# Patient Record
Sex: Female | Born: 1935 | Race: Black or African American | Hispanic: No | Smoking: Never smoker
Health system: Southern US, Community
[De-identification: ages and names within clinical notes are randomized; demographics above are authoritative.]

## PROBLEM LIST (undated history)

## (undated) DIAGNOSIS — I10 Essential (primary) hypertension: Secondary | ICD-10-CM

## (undated) DIAGNOSIS — Z973 Presence of spectacles and contact lenses: Secondary | ICD-10-CM

## (undated) DIAGNOSIS — K08109 Complete loss of teeth, unspecified cause, unspecified class: Secondary | ICD-10-CM

## (undated) DIAGNOSIS — Z9889 Other specified postprocedural states: Secondary | ICD-10-CM

## (undated) DIAGNOSIS — E039 Hypothyroidism, unspecified: Secondary | ICD-10-CM

## (undated) DIAGNOSIS — Z8719 Personal history of other diseases of the digestive system: Secondary | ICD-10-CM

## (undated) DIAGNOSIS — M1990 Unspecified osteoarthritis, unspecified site: Secondary | ICD-10-CM

## (undated) DIAGNOSIS — C801 Malignant (primary) neoplasm, unspecified: Secondary | ICD-10-CM

## (undated) DIAGNOSIS — C50919 Malignant neoplasm of unspecified site of unspecified female breast: Secondary | ICD-10-CM

## (undated) DIAGNOSIS — K449 Diaphragmatic hernia without obstruction or gangrene: Secondary | ICD-10-CM

## (undated) DIAGNOSIS — Z972 Presence of dental prosthetic device (complete) (partial): Secondary | ICD-10-CM

## (undated) DIAGNOSIS — T7840XA Allergy, unspecified, initial encounter: Secondary | ICD-10-CM

## (undated) DIAGNOSIS — M1A00X0 Idiopathic chronic gout, unspecified site, without tophus (tophi): Secondary | ICD-10-CM

## (undated) DIAGNOSIS — J189 Pneumonia, unspecified organism: Secondary | ICD-10-CM

## (undated) DIAGNOSIS — K219 Gastro-esophageal reflux disease without esophagitis: Secondary | ICD-10-CM

## (undated) DIAGNOSIS — N950 Postmenopausal bleeding: Secondary | ICD-10-CM

## (undated) DIAGNOSIS — H9190 Unspecified hearing loss, unspecified ear: Secondary | ICD-10-CM

## (undated) DIAGNOSIS — G250 Essential tremor: Secondary | ICD-10-CM

## (undated) DIAGNOSIS — Z974 Presence of external hearing-aid: Secondary | ICD-10-CM

## (undated) DIAGNOSIS — E079 Disorder of thyroid, unspecified: Secondary | ICD-10-CM

## (undated) DIAGNOSIS — E119 Type 2 diabetes mellitus without complications: Secondary | ICD-10-CM

## (undated) DIAGNOSIS — N3281 Overactive bladder: Secondary | ICD-10-CM

## (undated) DIAGNOSIS — M109 Gout, unspecified: Secondary | ICD-10-CM

## (undated) DIAGNOSIS — N182 Chronic kidney disease, stage 2 (mild): Secondary | ICD-10-CM

## (undated) DIAGNOSIS — M8580 Other specified disorders of bone density and structure, unspecified site: Secondary | ICD-10-CM

## (undated) DIAGNOSIS — Z8616 Personal history of COVID-19: Secondary | ICD-10-CM

## (undated) DIAGNOSIS — D649 Anemia, unspecified: Secondary | ICD-10-CM

## (undated) DIAGNOSIS — N840 Polyp of corpus uteri: Secondary | ICD-10-CM

## (undated) HISTORY — DX: Other specified postprocedural states: Z98.890

## (undated) HISTORY — DX: Disorder of thyroid, unspecified: E07.9

## (undated) HISTORY — PX: TUBAL LIGATION: SHX77

## (undated) HISTORY — PX: FINGER SURGERY: SHX640

## (undated) HISTORY — PX: OTHER SURGICAL HISTORY: SHX169

## (undated) HISTORY — DX: Allergy, unspecified, initial encounter: T78.40XA

## (undated) HISTORY — DX: Malignant (primary) neoplasm, unspecified: C80.1

## (undated) HISTORY — DX: Type 2 diabetes mellitus without complications: E11.9

## (undated) HISTORY — DX: Essential (primary) hypertension: I10

## (undated) HISTORY — PX: COLONOSCOPY: SHX174

---

## 1968-11-10 DIAGNOSIS — E890 Postprocedural hypothyroidism: Secondary | ICD-10-CM

## 1968-11-10 HISTORY — DX: Postprocedural hypothyroidism: E89.0

## 1968-11-10 HISTORY — PX: CHOLECYSTECTOMY: SHX55

## 1968-11-10 HISTORY — PX: CHOLECYSTECTOMY OPEN: SUR202

## 1968-11-10 HISTORY — PX: THYROID LOBECTOMY: SHX420

## 1968-11-10 HISTORY — PX: THYROID SURGERY: SHX805

## 1999-04-22 ENCOUNTER — Encounter: Payer: Self-pay | Admitting: Family Medicine

## 1999-04-22 ENCOUNTER — Ambulatory Visit (HOSPITAL_COMMUNITY): Admission: RE | Admit: 1999-04-22 | Discharge: 1999-04-22 | Payer: Self-pay | Admitting: Family Medicine

## 1999-07-30 ENCOUNTER — Encounter: Payer: Self-pay | Admitting: Family Medicine

## 1999-07-30 ENCOUNTER — Ambulatory Visit (HOSPITAL_COMMUNITY): Admission: RE | Admit: 1999-07-30 | Discharge: 1999-07-30 | Payer: Self-pay | Admitting: Family Medicine

## 1999-08-08 ENCOUNTER — Other Ambulatory Visit: Admission: RE | Admit: 1999-08-08 | Discharge: 1999-08-08 | Payer: Self-pay | Admitting: Family Medicine

## 2000-07-31 ENCOUNTER — Encounter: Payer: Self-pay | Admitting: Family Medicine

## 2000-07-31 ENCOUNTER — Ambulatory Visit (HOSPITAL_COMMUNITY): Admission: RE | Admit: 2000-07-31 | Discharge: 2000-07-31 | Payer: Self-pay | Admitting: Family Medicine

## 2000-09-10 HISTORY — PX: DILATION AND CURETTAGE OF UTERUS: SHX78

## 2000-09-17 ENCOUNTER — Inpatient Hospital Stay (HOSPITAL_COMMUNITY): Admission: AD | Admit: 2000-09-17 | Discharge: 2000-09-17 | Payer: Self-pay | Admitting: Obstetrics

## 2000-09-17 ENCOUNTER — Encounter (INDEPENDENT_AMBULATORY_CARE_PROVIDER_SITE_OTHER): Payer: Self-pay

## 2000-09-17 ENCOUNTER — Encounter: Payer: Self-pay | Admitting: Obstetrics

## 2000-09-18 ENCOUNTER — Other Ambulatory Visit: Admission: RE | Admit: 2000-09-18 | Discharge: 2000-09-18 | Payer: Self-pay | Admitting: Obstetrics

## 2000-09-18 ENCOUNTER — Encounter (INDEPENDENT_AMBULATORY_CARE_PROVIDER_SITE_OTHER): Payer: Self-pay

## 2000-09-23 ENCOUNTER — Encounter (INDEPENDENT_AMBULATORY_CARE_PROVIDER_SITE_OTHER): Payer: Self-pay

## 2000-09-23 ENCOUNTER — Ambulatory Visit (HOSPITAL_COMMUNITY): Admission: RE | Admit: 2000-09-23 | Discharge: 2000-09-23 | Payer: Self-pay | Admitting: Obstetrics

## 2000-10-03 ENCOUNTER — Emergency Department (HOSPITAL_COMMUNITY): Admission: EM | Admit: 2000-10-03 | Discharge: 2000-10-03 | Payer: Self-pay | Admitting: Emergency Medicine

## 2001-04-08 ENCOUNTER — Encounter: Payer: Self-pay | Admitting: Emergency Medicine

## 2001-04-08 ENCOUNTER — Emergency Department (HOSPITAL_COMMUNITY): Admission: EM | Admit: 2001-04-08 | Discharge: 2001-04-08 | Payer: Self-pay | Admitting: Emergency Medicine

## 2001-09-10 ENCOUNTER — Encounter: Payer: Self-pay | Admitting: Family Medicine

## 2001-09-10 ENCOUNTER — Ambulatory Visit (HOSPITAL_COMMUNITY): Admission: RE | Admit: 2001-09-10 | Discharge: 2001-09-10 | Payer: Self-pay | Admitting: Family Medicine

## 2002-03-22 ENCOUNTER — Encounter: Admission: RE | Admit: 2002-03-22 | Discharge: 2002-03-22 | Payer: Self-pay | Admitting: Cardiology

## 2002-03-22 ENCOUNTER — Encounter: Payer: Self-pay | Admitting: Cardiology

## 2002-04-01 ENCOUNTER — Ambulatory Visit (HOSPITAL_COMMUNITY): Admission: RE | Admit: 2002-04-01 | Discharge: 2002-04-01 | Payer: Self-pay | Admitting: Cardiology

## 2002-04-01 ENCOUNTER — Encounter: Payer: Self-pay | Admitting: Cardiology

## 2002-11-15 ENCOUNTER — Ambulatory Visit (HOSPITAL_COMMUNITY): Admission: RE | Admit: 2002-11-15 | Discharge: 2002-11-15 | Payer: Self-pay | Admitting: Family Medicine

## 2002-11-15 ENCOUNTER — Encounter: Payer: Self-pay | Admitting: Family Medicine

## 2004-04-03 ENCOUNTER — Encounter: Admission: RE | Admit: 2004-04-03 | Discharge: 2004-04-03 | Payer: Self-pay | Admitting: Family Medicine

## 2004-04-10 ENCOUNTER — Ambulatory Visit (HOSPITAL_COMMUNITY): Admission: RE | Admit: 2004-04-10 | Discharge: 2004-04-10 | Payer: Self-pay | Admitting: Family Medicine

## 2004-06-10 ENCOUNTER — Ambulatory Visit (HOSPITAL_COMMUNITY): Admission: RE | Admit: 2004-06-10 | Discharge: 2004-06-10 | Payer: Self-pay | Admitting: Gastroenterology

## 2005-04-20 ENCOUNTER — Emergency Department (HOSPITAL_COMMUNITY): Admission: AD | Admit: 2005-04-20 | Discharge: 2005-04-20 | Payer: Self-pay | Admitting: Emergency Medicine

## 2006-07-16 ENCOUNTER — Encounter: Admission: RE | Admit: 2006-07-16 | Discharge: 2006-07-16 | Payer: Self-pay | Admitting: Family Medicine

## 2007-02-17 ENCOUNTER — Ambulatory Visit (HOSPITAL_COMMUNITY): Admission: RE | Admit: 2007-02-17 | Discharge: 2007-02-17 | Payer: Self-pay | Admitting: Family Medicine

## 2007-04-21 ENCOUNTER — Ambulatory Visit (HOSPITAL_COMMUNITY): Admission: RE | Admit: 2007-04-21 | Discharge: 2007-04-21 | Payer: Self-pay | Admitting: Family Medicine

## 2007-09-29 ENCOUNTER — Emergency Department (HOSPITAL_COMMUNITY): Admission: EM | Admit: 2007-09-29 | Discharge: 2007-09-29 | Payer: Self-pay | Admitting: Family Medicine

## 2008-03-22 ENCOUNTER — Encounter: Admission: RE | Admit: 2008-03-22 | Discharge: 2008-03-22 | Payer: Self-pay | Admitting: Family Medicine

## 2008-06-07 ENCOUNTER — Ambulatory Visit (HOSPITAL_COMMUNITY): Admission: RE | Admit: 2008-06-07 | Discharge: 2008-06-07 | Payer: Self-pay | Admitting: Family Medicine

## 2009-08-05 ENCOUNTER — Emergency Department (HOSPITAL_COMMUNITY): Admission: EM | Admit: 2009-08-05 | Discharge: 2009-08-05 | Payer: Self-pay | Admitting: Emergency Medicine

## 2009-08-21 ENCOUNTER — Other Ambulatory Visit: Admission: RE | Admit: 2009-08-21 | Discharge: 2009-08-21 | Payer: Self-pay | Admitting: Family Medicine

## 2009-11-10 HISTORY — PX: SHOULDER ARTHROSCOPY: SHX128

## 2010-10-23 ENCOUNTER — Other Ambulatory Visit
Admission: RE | Admit: 2010-10-23 | Discharge: 2010-10-23 | Payer: Self-pay | Source: Home / Self Care | Admitting: Family Medicine

## 2010-12-01 ENCOUNTER — Encounter: Payer: Self-pay | Admitting: Family Medicine

## 2011-01-20 ENCOUNTER — Ambulatory Visit
Admission: RE | Admit: 2011-01-20 | Discharge: 2011-01-20 | Disposition: A | Discharge status: Home / Self Care | Payer: Medicare Other | Source: Ambulatory Visit | Attending: Orthopedic Surgery | Admitting: Orthopedic Surgery

## 2011-01-20 ENCOUNTER — Other Ambulatory Visit: Payer: Self-pay | Admitting: Orthopedic Surgery

## 2011-01-20 DIAGNOSIS — M25512 Pain in left shoulder: Secondary | ICD-10-CM

## 2011-02-12 ENCOUNTER — Encounter (HOSPITAL_COMMUNITY)
Admission: RE | Admit: 2011-02-12 | Discharge: 2011-02-12 | Disposition: A | Discharge status: Home / Self Care | Payer: Medicare Other | Source: Ambulatory Visit | Attending: Orthopedic Surgery | Admitting: Orthopedic Surgery

## 2011-03-03 ENCOUNTER — Encounter (HOSPITAL_COMMUNITY)
Admission: RE | Admit: 2011-03-03 | Discharge: 2011-03-03 | Disposition: A | Discharge status: Home / Self Care | Payer: Medicare Other | Source: Ambulatory Visit | Attending: Orthopedic Surgery | Admitting: Orthopedic Surgery

## 2011-03-03 ENCOUNTER — Other Ambulatory Visit (HOSPITAL_COMMUNITY): Payer: Self-pay | Admitting: Orthopedic Surgery

## 2011-03-03 DIAGNOSIS — Z01811 Encounter for preprocedural respiratory examination: Secondary | ICD-10-CM

## 2011-03-03 LAB — DIFFERENTIAL
Basophils Absolute: 0 10*3/uL (ref 0.0–0.1)
Basophils Relative: 0 % (ref 0–1)
Eosinophils Absolute: 0.2 10*3/uL (ref 0.0–0.7)
Eosinophils Relative: 4 % (ref 0–5)
Lymphocytes Relative: 34 % (ref 12–46)
Lymphs Abs: 2 10*3/uL (ref 0.7–4.0)
Monocytes Absolute: 0.6 10*3/uL (ref 0.1–1.0)
Monocytes Relative: 10 % (ref 3–12)
Neutro Abs: 3.1 10*3/uL (ref 1.7–7.7)
Neutrophils Relative %: 53 % (ref 43–77)

## 2011-03-03 LAB — TYPE AND SCREEN
ABO/RH(D): B POS
Antibody Screen: NEGATIVE
Unit division: 0

## 2011-03-03 LAB — URINALYSIS, ROUTINE W REFLEX MICROSCOPIC
Bilirubin Urine: NEGATIVE
Glucose, UA: NEGATIVE mg/dL
Ketones, ur: NEGATIVE mg/dL
Nitrite: NEGATIVE
Protein, ur: NEGATIVE mg/dL
Specific Gravity, Urine: 1.01 (ref 1.005–1.030)
Urobilinogen, UA: 1 mg/dL (ref 0.0–1.0)
pH: 6 (ref 5.0–8.0)

## 2011-03-03 LAB — BASIC METABOLIC PANEL
BUN: 15 mg/dL (ref 6–23)
CO2: 30 mEq/L (ref 19–32)
Calcium: 9.8 mg/dL (ref 8.4–10.5)
Chloride: 105 mEq/L (ref 96–112)
Creatinine, Ser: 1.24 mg/dL — ABNORMAL HIGH (ref 0.4–1.2)
GFR calc Af Amer: 51 mL/min — ABNORMAL LOW (ref >60)
GFR calc non Af Amer: 42 mL/min — ABNORMAL LOW (ref >60)
Glucose, Bld: 117 mg/dL — ABNORMAL HIGH (ref 70–99)
Potassium: 4 mEq/L (ref 3.5–5.1)
Sodium: 141 mEq/L (ref 135–145)

## 2011-03-03 LAB — CBC
HCT: 34.7 % — ABNORMAL LOW (ref 36.0–46.0)
Hemoglobin: 12.4 g/dL (ref 12.0–15.0)
MCH: 29.9 pg (ref 26.0–34.0)
MCHC: 35.7 g/dL (ref 30.0–36.0)
MCV: 83.6 fL (ref 78.0–100.0)
Platelets: 228 10*3/uL (ref 150–400)
RBC: 4.15 MIL/uL (ref 3.87–5.11)
RDW: 14.4 % (ref 11.5–15.5)
WBC: 6 10*3/uL (ref 4.0–10.5)

## 2011-03-03 LAB — PROTIME-INR
INR: 1.06 (ref 0.00–1.49)
Prothrombin Time: 14 seconds (ref 11.6–15.2)

## 2011-03-03 LAB — APTT: aPTT: 27 seconds (ref 24–37)

## 2011-03-03 LAB — SURGICAL PCR SCREEN
MRSA, PCR: NEGATIVE
Staphylococcus aureus: POSITIVE — AB

## 2011-03-03 LAB — URINE MICROSCOPIC-ADD ON

## 2011-03-03 LAB — ABO/RH: ABO/RH(D): B POS

## 2011-03-04 ENCOUNTER — Inpatient Hospital Stay (HOSPITAL_COMMUNITY): Payer: Medicare Other

## 2011-03-04 ENCOUNTER — Inpatient Hospital Stay (HOSPITAL_COMMUNITY)
Admission: RE | Admit: 2011-03-04 | Discharge: 2011-03-06 | DRG: 484 | Disposition: A | Discharge status: Home / Self Care | Payer: Medicare Other | Source: Ambulatory Visit | Attending: Orthopedic Surgery | Admitting: Orthopedic Surgery

## 2011-03-04 DIAGNOSIS — E039 Hypothyroidism, unspecified: Secondary | ICD-10-CM | POA: Diagnosis present

## 2011-03-04 DIAGNOSIS — E119 Type 2 diabetes mellitus without complications: Secondary | ICD-10-CM | POA: Diagnosis present

## 2011-03-04 DIAGNOSIS — Z01818 Encounter for other preprocedural examination: Secondary | ICD-10-CM

## 2011-03-04 DIAGNOSIS — M19019 Primary osteoarthritis, unspecified shoulder: Principal | ICD-10-CM | POA: Diagnosis present

## 2011-03-04 DIAGNOSIS — I10 Essential (primary) hypertension: Secondary | ICD-10-CM | POA: Diagnosis present

## 2011-03-04 DIAGNOSIS — E669 Obesity, unspecified: Secondary | ICD-10-CM | POA: Diagnosis present

## 2011-03-04 DIAGNOSIS — Z01812 Encounter for preprocedural laboratory examination: Secondary | ICD-10-CM

## 2011-03-04 LAB — GLUCOSE, CAPILLARY
Glucose-Capillary: 99 mg/dL (ref 70–99)
Glucose-Capillary: 105 mg/dL — ABNORMAL HIGH (ref 70–99)
Glucose-Capillary: 92 mg/dL (ref 70–99)
Glucose-Capillary: 112 mg/dL — ABNORMAL HIGH (ref 70–99)

## 2011-03-05 LAB — GLUCOSE, CAPILLARY
Glucose-Capillary: 125 mg/dL — ABNORMAL HIGH (ref 70–99)
Glucose-Capillary: 107 mg/dL — ABNORMAL HIGH (ref 70–99)
Glucose-Capillary: 118 mg/dL — ABNORMAL HIGH (ref 70–99)
Glucose-Capillary: 125 mg/dL — ABNORMAL HIGH (ref 70–99)

## 2011-03-05 LAB — CBC
HCT: 25.3 % — ABNORMAL LOW (ref 36.0–46.0)
Hemoglobin: 9 g/dL — ABNORMAL LOW (ref 12.0–15.0)
MCH: 29.8 pg (ref 26.0–34.0)
MCHC: 35.6 g/dL (ref 30.0–36.0)
MCV: 83.8 fL (ref 78.0–100.0)
Platelets: 170 10*3/uL (ref 150–400)
RBC: 3.02 MIL/uL — ABNORMAL LOW (ref 3.87–5.11)
RDW: 14.5 % (ref 11.5–15.5)
WBC: 5.3 10*3/uL (ref 4.0–10.5)

## 2011-03-05 LAB — BASIC METABOLIC PANEL
BUN: 17 mg/dL (ref 6–23)
CO2: 26 mEq/L (ref 19–32)
Calcium: 8.4 mg/dL (ref 8.4–10.5)
Chloride: 108 mEq/L (ref 96–112)
Creatinine, Ser: 1.32 mg/dL — ABNORMAL HIGH (ref 0.4–1.2)
GFR calc Af Amer: 48 mL/min — ABNORMAL LOW (ref >60)
GFR calc non Af Amer: 39 mL/min — ABNORMAL LOW (ref >60)
Glucose, Bld: 122 mg/dL — ABNORMAL HIGH (ref 70–99)
Potassium: 3.8 mEq/L (ref 3.5–5.1)
Sodium: 141 mEq/L (ref 135–145)

## 2011-03-06 LAB — CBC
HCT: 25 % — ABNORMAL LOW (ref 36.0–46.0)
Hemoglobin: 8.8 g/dL — ABNORMAL LOW (ref 12.0–15.0)
MCH: 29.4 pg (ref 26.0–34.0)
MCHC: 35.2 g/dL (ref 30.0–36.0)
MCV: 83.6 fL (ref 78.0–100.0)
Platelets: 175 10*3/uL (ref 150–400)
RBC: 2.99 MIL/uL — ABNORMAL LOW (ref 3.87–5.11)
RDW: 14.5 % (ref 11.5–15.5)
WBC: 6.3 10*3/uL (ref 4.0–10.5)

## 2011-03-06 LAB — BASIC METABOLIC PANEL
BUN: 21 mg/dL (ref 6–23)
CO2: 26 mEq/L (ref 19–32)
Calcium: 8.8 mg/dL (ref 8.4–10.5)
Chloride: 103 mEq/L (ref 96–112)
Creatinine, Ser: 1.53 mg/dL — ABNORMAL HIGH (ref 0.4–1.2)
GFR calc Af Amer: 40 mL/min — ABNORMAL LOW (ref >60)
GFR calc non Af Amer: 33 mL/min — ABNORMAL LOW (ref >60)
Glucose, Bld: 112 mg/dL — ABNORMAL HIGH (ref 70–99)
Potassium: 3.9 mEq/L (ref 3.5–5.1)
Sodium: 138 mEq/L (ref 135–145)

## 2011-03-06 LAB — GLUCOSE, CAPILLARY
Glucose-Capillary: 117 mg/dL — ABNORMAL HIGH (ref 70–99)
Glucose-Capillary: 100 mg/dL — ABNORMAL HIGH (ref 70–99)

## 2011-03-14 NOTE — Op Note (Signed)
NAMEEGAN, SAHLIN               ACCOUNT NO.:  [id number]  MEDICAL RECORD NO.:  [id number]           PATIENT TYPE:  I  LOCATION:  5019                         FACILITY:  [facility]  PHYSICIAN:  Jones Broom, MD    DATE OF BIRTH:  1935-10-18  DATE OF PROCEDURE:  03/04/2011 DATE OF DISCHARGE:                              OPERATIVE REPORT   PREOPERATIVE DIAGNOSIS:  Left shoulder glenohumeral arthritis.  POSTOPERATIVE DIAGNOSIS:  Left shoulder glenohumeral arthritis.  PROCEDURE PERFORMED:  Left total shoulder arthroplasty.  ATTENDING SURGEON:  Jones Broom, MD  ASSISTANT:  Skip Mayer, PA-C  COMPLICATIONS:  None.  DRAINS:  One medium Hemovac.  SPECIMENS:  The humeral head was discarded.  ESTIMATED BLOOD LOSS:  200 mL.  ANESTHESIA:  General with preoperative interscalene block.  INDICATIONS FOR SURGERY:  The patient is a 76 year old female with long history of left shoulder pain with a known left glenohumeral osteoarthritis.  She failed conservative management and wished to go forward with total shoulder arthroplasty for pain relief and increased function.  She understood risks, benefits, and alternatives to the procedure including but not limited to risk of bleeding, infection, damage to neurovascular structures, risk of stiffness, and potential need for future revision surgery.  She understood all that and elected to go forward with surgery.  FINDINGS:  Preoperative range of motion was about 35 degrees external rotation.  No instability.  The shoulder was replaced with DePuy AP total shoulder with a 12 stem, 44 x 15 eccentric head, 44 anchor peg glenoid with pressure cementation of the glenoid and Press-Fit stem.  PROCEDURE IN DETAIL:  The patient was identified in the preoperative holding area where I personally marked the operative site after verifying site side and procedure with the patient.  She had an interscalene block given by the attending  anesthesiologist.  She was taken back to the operating room where general anesthesia was induced without complication.  She was placed in a beach-chair position with the back raised about 35 degrees.  The left upper extremity was prepped and draped in a standard sterile fashion.  The patient did receive 2 g of IV Ancef within 30 minutes of the incision.  The appropriate time-out procedure was carried out.  Left upper extremity was prepped and draped, and approximately 10-cm incision was made from the palpable coracoid tip to the mid humeral level at the axilla.  Dissection was carried down through subcutaneous tissues and the cephalic vein was identified and taken laterally with the deltoid.  The pectoralis major was taken medially.  The upper 1 cm of the pectoralis major was released from its attachment on the humerus.  Clavipectoral fascia was incised just lateral to conjoined tendon.  This incision was carried up to but not into the coracoacromial ligament.  Digital palpation was used to prove the integrity of the axillary nerve, which was protected throughout the procedure.  Musculocutaneous nerve was not palpated in the operative field.  The conjoined tendon was then retracted medially and the deltoid laterally.  Anterior circumflex humeral vessels were clamped and coagulated.  The soft tissues overlying the biceps was incised and  this incision was carried across the transverse humeral ligament to the base of the coracoid.  The biceps was then tenodesed to the soft tissue just above the pectoralis major and the remaining portion of the biceps superiorly was excised.  An osteotomy was then performed to the lesser tuberosity and the subscapularis was freed from the underlying capsule. Capsule was then released all the way down to the 6 o'clock position of the humeral head.  The humeral head was then delivered with simultaneous abduction, extension, and external rotation.  All humeral  osteophytes were removed and the anatomic neck of the humerus was marked and cut freehand at approximately 25-30 degrees retroversion with about 3 mm from the posterior cuff reflection.  The head size was estimated be a 44 medium offset.  At that point, the humeral head was retracted posteriorly with a Coude retractor.  Given the small sizer of her head, Coude retractor did not retract the head well and was causing some compression of the anterior cortex.  Therefore, a large Derra retractor was used.  The anterior-inferior capsule was excised taking great care to protect the axillary nerve.  Digital palpation demonstrated integrity of the axillary nerve after the capsular excision.  The remaining biceps anchor and the entire anterior-inferior labrum was excised.  The posterior labrum was excised.  The posterior capsule was not released. The guide pin was then placed using the 44 template bicortically.  The reamer was then used to ream the concentric bone with punctate bleeding. This gave an excellent concentric surface.  The center hole was then drilled for anchor peg glenoid followed by the three peripheral holes and none of the holes exited the glenoid wall.  I then pulse irrigated these holes and dried them with Surgicel and thrombin.  The three peripheral holes were then pressure cemented and the anchor peg glenoid was placed and impacted with an excellent fit.  A 44 glenoid was used. This was held manually in place until the cement hardened.  Proximal humerus was then again exposed taking care not to displace the glenoid. There was some compression of the anterior cortex from exposure of the glenoid.  The humerus was then sequentially reamed going from 6-12 mm reamer by 2 mm increments.  The 12-mm reamer was found to have appropriate cortical contact.  A 12 box osteotome was then used with a 12 broach.  The broach handle was removed and a trial head was placed. The 44 x 15 head fit  best.  With the trial implant, there was approximately 50% posterior translation with immediate snap back to the anatomic position.  No tendency towards posterior subluxation with forward elevation.  The trial was removed and the final implant was prepared on the back table with a 12 stem and a 44 x 15 eccentric head. The implant was then impacted and achieved excellent anatomic reconstruction of the proximal humerus.  Bone graft was placed anteriorly where the cortex had been pushed down slightly.  A #2 fiber wire was placed around the neck prior to impaction for double row repair of the subscapularis.  The joint was then copiously irrigated with pulse lavage.  Subscapularis and lesser tuberosity were then repaired using three #2 fiber wires and the #2 FiberWire around the neck of the implant in a double row type repair.  One #1 Ethibond was then placed at the rotator interval just above the lesser tuberosity.  After repair of the lesser tuberosity, a medium Hemovac was then placed out anterolaterally  and again copious irrigation was used.  Skin was then closed in layers with 2-0 Vicryl in deep dermal layer, 4-0 Monocryl for skin closure. Sterile dressing was then applied including Steri-Strips, 4x4s, ABDs, and tape.  The patient was placed in a regular sling and allowed to awaken from general anesthesia.  She was transferred to the stretcher and taken to the recovery room in stable condition.  POSTOPERATIVE PLAN:  She will be kept in the hospital for pain control and therapy.  She will have a goal of 40 degrees external rotation with 140 degrees forward flexion passively using the other arm.  No active motion at this time.  She will likely be kept in the hospital for 2 days and then discharged to home with her family.     Jones Broom, MD     JC/MEDQ  D:  03/04/2011  T:  03/05/2011  Job:  [id number]  Electronically Signed by Jones Broom  on 03/14/2011 08:21:54 AM

## 2011-03-28 NOTE — Op Note (Signed)
[facility]  Patient:    Karla Willis, Karla Willis                      MRN: [id number] Proc. Date: 09/23/00 Adm. Date:  [id number] Attending:  Venita Sheffield                           Operative Report  PREOPERATIVE DIAGNOSIS:       Postmenopausal bleeding.  SURGEON:                      Kathreen Cosier, M.D.  ANESTHESIA:                   MAC.  DESCRIPTION OF PROCEDURE:     With the patient in the lithotomy position, after the MAC had been administered, the perineum and vagina were prepped and draped.  The bladder was entered with a straight catheter.  A bimanual examination revealed the uterus to be top normal size.  Negative adnexa.  A weighted speculum was placed in the vagina.  The cervix was injected at 3, 6, and 9 oclock with 1% Xylocaine, for a total of 6 cc.  The cervix was curetted.  A small amount of tissue was obtained.  The endometrial cavity was sounded to 10.0 cm.  The cervix was dilated with a 27-French.  Then the cavity was curetted until clean.  A small amount of tissue was obtained.  The patient tolerated the procedure well, and was taken to the recovery room in good condition. DD:  09/23/00 TD:  09/23/00 Job: [id number]

## 2011-03-28 NOTE — Op Note (Signed)
NAME:  Karla Willis, Karla Willis                         ACCOUNT NO.:  [id number]   MEDICAL RECORD NO.:  [id number]                   PATIENT TYPE:  AMB   LOCATION:  ENDO                                 FACILITY:  [facility]   PHYSICIAN:  Anselmo Rod, M.D.               DATE OF BIRTH:  05-Jul-1935   DATE OF PROCEDURE:  06/10/2004  DATE OF DISCHARGE:                                 OPERATIVE REPORT   PROCEDURE PERFORMED:  Screening colonoscopy.   ENDOSCOPIST:  Anselmo Rod, M.D.   INSTRUMENT USED:  Olympus video colonoscope.   INDICATION FOR PROCEDURE:  A 76 year old African-American female undergoing  screening colonoscopy.  Rule out colonic polyps, masses, etc.  The patient  had guaiac-positive stool on a recent physical.   PREPROCEDURE PREPARATION:  Informed consent was procured from the patient.  The patient was fasted for eight hours prior to the procedure and prepped  with a bottle of magnesium citrate and a gallon of GoLYTELY the night prior  to the procedure.   PREPROCEDURE PHYSICAL:  VITAL SIGNS:  The patient had stable vital signs.  NECK:  Supple.  CHEST:  Clear to auscultation.  S1, S2 regular.  ABDOMEN:  Soft with normal bowel sounds.   DESCRIPTION OF PROCEDURE:  The patient was placed in the left lateral  decubitus position and sedated with 70 mg of Demerol and 7 mg of Versed in  slow incremental doses.  Once the patient was adequately sedate and  maintained on low-flow oxygen and continuous cardiac monitoring, the Olympus  video colonoscope was advanced from the rectum to the cecum.  The  appendiceal orifice and the ileocecal valve were clearly visualized and  photographed.  No masses, polyps, erosions, ulcerations, or diverticula were  seen.   IMPRESSION:  Normal colonoscopy.  Proceed with an EGD at this time.  Further  recommendations made thereafter.                                               Anselmo Rod, M.D.    JNM/MEDQ  D:  06/10/2004  T:  06/11/2004   Job:  [id number]   cc:   Renaye Rakers, M.D.  [phone] [address], [address]  [place]  [place] [address]  Fax: [phone]

## 2011-03-28 NOTE — Op Note (Signed)
NAME:  Karla Willis, Karla Willis                         ACCOUNT NO.:  [id number]   MEDICAL RECORD NO.:  [id number]                   PATIENT TYPE:  AMB   LOCATION:  ENDO                                 FACILITY:  [facility]   PHYSICIAN:  Anselmo Rod, M.D.               DATE OF BIRTH:  02-20-1935   DATE OF PROCEDURE:  06/10/2004  DATE OF DISCHARGE:                                 OPERATIVE REPORT   PROCEDURE PERFORMED:  Esophagogastroduodenoscopy.   ENDOSCOPIST:  Charna Elizabeth, M.D.   INSTRUMENT USED:  Olympus video panendoscope.   INDICATIONS FOR PROCEDURE:  Guaiac positive stools in a 76 year old African-  American female with a normal colonoscopy.  Rule out peptic ulcer disease,  esophagitis, gastritis, etc.   PREPROCEDURE PREPARATION:  Informed consent was procured from the patient.  The patient was fasted for eight hours prior to the procedure.   PREPROCEDURE PHYSICAL:  The patient had stable vital signs.  Neck supple,  chest clear to auscultation.  S1, S2 regular.  Abdomen soft with normal  bowel sounds.   DESCRIPTION OF PROCEDURE:  The patient was placed in the left lateral  decubitus position and sedated with Demerol and Versed.  Once the patient  was adequately sedated and maintained on low-flow oxygen and continuous  cardiac monitoring, the Olympus video panendoscope was advanced through the  mouth piece over the tongue into the esophagus under direct vision.  The  entire esophagus appeared normal with no evidence of ring, stricture,  masses, esophagitis or Barrett's mucosa.  The scope was then advanced to the  stomach.  The entire gastric mucosa and proximal small bowel appeared  normal.   IMPRESSION:  Normal esophagogastroduodenoscopy.   RECOMMENDATIONS:  Outpatient follow-up for repeat guaiac testing and CBC  check.  Further recommendations made in follow-up.                                               Anselmo Rod, M.D.    JNM/MEDQ  D:  06/10/2004  T:  06/11/2004   Job:  [id number]   cc:   Renaye Rakers, M.D.  [phone] [address], [address]  [place]  [place] [address]  Fax: [phone]

## 2011-06-11 ENCOUNTER — Inpatient Hospital Stay (INDEPENDENT_AMBULATORY_CARE_PROVIDER_SITE_OTHER)
Admission: RE | Admit: 2011-06-11 | Discharge: 2011-06-11 | Disposition: A | Discharge status: Home / Self Care | Payer: Medicare Other | Source: Ambulatory Visit | Attending: Family Medicine | Admitting: Family Medicine

## 2011-06-11 DIAGNOSIS — M799 Soft tissue disorder, unspecified: Secondary | ICD-10-CM

## 2011-06-11 DIAGNOSIS — M542 Cervicalgia: Secondary | ICD-10-CM

## 2011-11-18 ENCOUNTER — Other Ambulatory Visit (HOSPITAL_COMMUNITY): Payer: Self-pay | Admitting: Family Medicine

## 2011-11-18 DIAGNOSIS — Z1231 Encounter for screening mammogram for malignant neoplasm of breast: Secondary | ICD-10-CM

## 2011-12-16 ENCOUNTER — Ambulatory Visit (HOSPITAL_COMMUNITY)
Admission: RE | Admit: 2011-12-16 | Discharge: 2011-12-16 | Disposition: A | Discharge status: Home / Self Care | Payer: Medicare Other | Source: Ambulatory Visit | Attending: Family Medicine | Admitting: Family Medicine

## 2011-12-16 DIAGNOSIS — Z1231 Encounter for screening mammogram for malignant neoplasm of breast: Secondary | ICD-10-CM | POA: Insufficient documentation

## 2011-12-23 ENCOUNTER — Other Ambulatory Visit: Payer: Self-pay | Admitting: Family Medicine

## 2011-12-23 DIAGNOSIS — R928 Other abnormal and inconclusive findings on diagnostic imaging of breast: Secondary | ICD-10-CM

## 2011-12-30 ENCOUNTER — Ambulatory Visit
Admission: RE | Admit: 2011-12-30 | Discharge: 2011-12-30 | Disposition: A | Discharge status: Home / Self Care | Payer: Medicare Other | Source: Ambulatory Visit | Attending: Family Medicine | Admitting: Family Medicine

## 2011-12-30 DIAGNOSIS — R928 Other abnormal and inconclusive findings on diagnostic imaging of breast: Secondary | ICD-10-CM

## 2012-05-28 ENCOUNTER — Other Ambulatory Visit: Payer: Self-pay | Admitting: Family Medicine

## 2012-05-28 DIAGNOSIS — N63 Unspecified lump in unspecified breast: Secondary | ICD-10-CM

## 2012-06-17 ENCOUNTER — Other Ambulatory Visit: Payer: Medicare Other

## 2012-06-22 ENCOUNTER — Ambulatory Visit
Admission: RE | Admit: 2012-06-22 | Discharge: 2012-06-22 | Disposition: A | Discharge status: Home / Self Care | Payer: Medicare Other | Source: Ambulatory Visit | Attending: Family Medicine | Admitting: Family Medicine

## 2012-06-22 ENCOUNTER — Other Ambulatory Visit: Payer: Self-pay | Admitting: Family Medicine

## 2012-06-22 DIAGNOSIS — N63 Unspecified lump in unspecified breast: Secondary | ICD-10-CM

## 2012-11-26 ENCOUNTER — Other Ambulatory Visit: Payer: Self-pay | Admitting: Family Medicine

## 2012-11-26 ENCOUNTER — Other Ambulatory Visit (HOSPITAL_COMMUNITY)
Admission: RE | Admit: 2012-11-26 | Discharge: 2012-11-26 | Disposition: A | Discharge status: Home / Self Care | Payer: Medicare Other | Source: Ambulatory Visit | Attending: Family Medicine | Admitting: Family Medicine

## 2012-11-26 DIAGNOSIS — Z124 Encounter for screening for malignant neoplasm of cervix: Secondary | ICD-10-CM | POA: Insufficient documentation

## 2013-08-31 ENCOUNTER — Other Ambulatory Visit: Payer: Self-pay

## 2013-08-31 DIAGNOSIS — Z1231 Encounter for screening mammogram for malignant neoplasm of breast: Secondary | ICD-10-CM

## 2013-09-28 ENCOUNTER — Ambulatory Visit
Admission: RE | Admit: 2013-09-28 | Discharge: 2013-09-28 | Disposition: A | Discharge status: Home / Self Care | Payer: Medicare Other | Source: Ambulatory Visit

## 2013-09-28 DIAGNOSIS — Z1231 Encounter for screening mammogram for malignant neoplasm of breast: Secondary | ICD-10-CM

## 2013-09-29 ENCOUNTER — Other Ambulatory Visit: Payer: Self-pay | Admitting: Family Medicine

## 2013-09-29 DIAGNOSIS — R928 Other abnormal and inconclusive findings on diagnostic imaging of breast: Secondary | ICD-10-CM

## 2013-10-19 ENCOUNTER — Other Ambulatory Visit: Payer: Self-pay | Admitting: Family Medicine

## 2013-10-19 ENCOUNTER — Ambulatory Visit
Admission: RE | Admit: 2013-10-19 | Discharge: 2013-10-19 | Disposition: A | Discharge status: Home / Self Care | Payer: Medicare Other | Source: Ambulatory Visit | Attending: Family Medicine | Admitting: Family Medicine

## 2013-10-19 DIAGNOSIS — R928 Other abnormal and inconclusive findings on diagnostic imaging of breast: Secondary | ICD-10-CM

## 2013-10-19 DIAGNOSIS — R921 Mammographic calcification found on diagnostic imaging of breast: Secondary | ICD-10-CM

## 2013-10-24 ENCOUNTER — Ambulatory Visit
Admission: RE | Admit: 2013-10-24 | Discharge: 2013-10-24 | Disposition: A | Discharge status: Home / Self Care | Payer: Medicare Other | Source: Ambulatory Visit | Attending: Family Medicine | Admitting: Family Medicine

## 2013-10-24 DIAGNOSIS — D0511 Intraductal carcinoma in situ of right breast: Secondary | ICD-10-CM

## 2013-10-24 DIAGNOSIS — C50919 Malignant neoplasm of unspecified site of unspecified female breast: Secondary | ICD-10-CM

## 2013-10-24 DIAGNOSIS — R921 Mammographic calcification found on diagnostic imaging of breast: Secondary | ICD-10-CM

## 2013-10-24 HISTORY — DX: Malignant neoplasm of unspecified site of unspecified female breast: C50.919

## 2013-10-24 HISTORY — DX: Intraductal carcinoma in situ of right breast: D05.11

## 2013-10-26 ENCOUNTER — Other Ambulatory Visit: Payer: Self-pay | Admitting: Family Medicine

## 2013-10-26 DIAGNOSIS — D0511 Intraductal carcinoma in situ of right breast: Secondary | ICD-10-CM

## 2013-10-26 HISTORY — PX: BREAST BIOPSY: SHX20

## 2013-10-28 ENCOUNTER — Telehealth: Payer: Self-pay | Admitting: *Deleted

## 2013-10-28 NOTE — Telephone Encounter (Signed)
Left vm for pt to call concerning appt.

## 2013-11-01 ENCOUNTER — Other Ambulatory Visit: Payer: Medicare Other

## 2013-11-02 ENCOUNTER — Telehealth: Payer: Self-pay | Admitting: *Deleted

## 2013-11-02 NOTE — Telephone Encounter (Signed)
Called and spoke with patient.  Contact information given.  Patient states she has some bleeding since her biopsy on 10/24/13.  Instructed [facility] to call [facility] to let them know and that they may want her to come in to have it assessed.  Patient verbalized understanding.  Encouraged patient to call with any questions or concerns.

## 2013-11-04 ENCOUNTER — Ambulatory Visit
Admission: RE | Admit: 2013-11-04 | Discharge: 2013-11-04 | Disposition: A | Discharge status: Home / Self Care | Payer: Medicare Other | Source: Ambulatory Visit | Attending: Family Medicine | Admitting: Family Medicine

## 2013-11-04 DIAGNOSIS — D0511 Intraductal carcinoma in situ of right breast: Secondary | ICD-10-CM

## 2013-11-04 MED ORDER — GADOBENATE DIMEGLUMINE 529 MG/ML IV SOLN
20.0000 mL | Freq: Once | INTRAVENOUS | Status: AC | PRN
  Administered 2013-11-04: 20 mL via INTRAVENOUS

## 2013-11-11 ENCOUNTER — Encounter (INDEPENDENT_AMBULATORY_CARE_PROVIDER_SITE_OTHER): Payer: Self-pay | Admitting: General Surgery

## 2013-11-11 ENCOUNTER — Encounter (INDEPENDENT_AMBULATORY_CARE_PROVIDER_SITE_OTHER): Payer: Self-pay

## 2013-11-11 ENCOUNTER — Ambulatory Visit (INDEPENDENT_AMBULATORY_CARE_PROVIDER_SITE_OTHER): Payer: Medicare Other | Admitting: General Surgery

## 2013-11-11 VITALS — BP 158/72 | HR 80 | Resp 18 | Ht 62.0 in | Wt 265.8 lb

## 2013-11-11 DIAGNOSIS — D0510 Intraductal carcinoma in situ of unspecified breast: Secondary | ICD-10-CM | POA: Insufficient documentation

## 2013-11-11 DIAGNOSIS — D0590 Unspecified type of carcinoma in situ of unspecified breast: Secondary | ICD-10-CM

## 2013-11-11 DIAGNOSIS — D0511 Intraductal carcinoma in situ of right breast: Secondary | ICD-10-CM

## 2013-11-11 HISTORY — DX: Intraductal carcinoma in situ of unspecified breast: D05.10

## 2013-11-11 MED ORDER — CEPHALEXIN 500 MG PO CAPS: 500.0000 mg | ORAL_CAPSULE | Freq: Four times a day (QID) | ORAL | Status: AC

## 2013-11-11 NOTE — Progress Notes (Signed)
Chief Complaint: New diagnosis of breast cancer  History:    Karla Willis is a 79 y.o. postmenopausal female referred by Dr. Almyra Free  for evaluation of recently diagnosed carcinoma of the right breast. She recently presented for a screening mamogram revealing approximately 1.5 cm cluster of suspicious calcifications in the medial right breast..  Subsequent imaging included diagnostic mamogram showing confirmation of these calcifications.    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 She is seen now in the office for initial treatment planning.  She has experienced no breast symptoms, specifically lobe or nipple discharge or skin changes. She does have a personal history of any previous breast problems with a remote previous benign right breast biopsy. The patient has noted some drainage persistently from the core biopsy site.  bilateral breast MRI has been performed showing no other abnormalities and confirming a 1.6 cm area of enhancement at the known site of malignancy..  Findings at that time were the following:  Tumor size: 1.6 cm  Tumor grade: nonspecified Estrogen Receptor: positive Progesterone Receptor: positive     Past Medical History  Diagnosis Date  . Allergy   . Cancer   . Diabetes mellitus without complication   . Thyroid disease   . Hypertension     Past Surgical History  Procedure Laterality Date  . Gallbladder sugery    . Thyroid surgery    . Cholecystectomy      Current Outpatient Prescriptions  Medication Sig Dispense Refill  . amLODipine (NORVASC) 5 MG tablet Take 5 mg by mouth daily.      . febuxostat (ULORIC) 40 MG tablet Take 40 mg by mouth daily. The pt is taking this as a trial medication so it could be a placebo for gout      . levothyroxine (SYNTHROID, LEVOTHROID) 100 MCG tablet Take 100 mcg by mouth daily before breakfast.      . lisinopril-hydrochlorothiazide (PRINZIDE,ZESTORETIC)  10-12.5 MG per tablet Take 1 tablet by mouth daily.      . pioglitazone (ACTOS) 30 MG tablet Take 30 mg by mouth daily.      . cephALEXin (KEFLEX) 500 MG capsule Take 1 capsule (500 mg total) by mouth 4 (four) times daily.  40 capsule  0   No current facility-administered medications for this visit.    History reviewed. No pertinent family history.  History   Social History  . Marital Status: Widowed    Spouse Name: N/A    Number of Children: N/A  . Years of Education: N/A   Social History Main Topics  . Smoking status: Never Smoker   . Smokeless tobacco: None  . Alcohol Use: No  . Drug Use: No  . Sexual Activity: None   Other Topics Concern  . None   Social History Narrative  . None     Review of Systems Constitutional: negative Respiratory: negative, mild SOB on exertion Cardiovascular: negative Musculoskeletal:positive for arthralgias     Objective:  BP 158/72  Pulse 80  Resp 18  Ht 5\' 2"  (1.575 m)  Wt 265 lb 12.8 oz (120.566 kg)  BMI 48.60 kg/m2  General: Alert, obese African American female, in no distress Skin: Warm and dry without rash or infection. HEENT: No palpable masses or thyromegaly. Sclera nonicteric. Pupils equal round and reactive. Oropharynx clear. Breasts: large breasts bilaterally. In the medial right breast there is an approximately 1 cm skin wound with overlying eschar and minimal surrounding erythema  and slight cloudy drainage. No other abnormalities in either breast. No palpable axillary adenopathy. Lymph nodes: No cervical, supraclavicular, or inguinal nodes palpable. Lungs: Breath sounds clear and equal without increased work of breathing Cardiovascular: Regular rate and rhythm without murmur. No JVD or edema. Peripheral pulses intact. Abdomen: Nondistended. Soft and nontender. No masses palpable. No organomegaly. No palpable hernias. Extremities: 1-2+ bilateral edema or joint swelling or deformity. No chronic venous stasis  changes. Neurologic: Alert and fully oriented. Gait normal.   Laboratory data:  CBC:  Lab Results  Component Value Date   WBC 6.3 03/06/2011   RBC 2.99* 03/06/2011   HGB 8.8* 03/06/2011   HCT 25.0* 03/06/2011   PLT 175 03/06/2011  ]  [facility] Labs:  Lab Results  Component Value Date   NA 138 03/06/2011   K 3.9 03/06/2011   CL 103 03/06/2011   CO2 26 03/06/2011   BUN 21 03/06/2011   CREATININE 1.53* 03/06/2011   CALCIUM 8.8 03/06/2011     Assessment  79 y.o. female with a new diagnosis of cancer of the the right breast upper inner quadrant.  Clinical 0, estrogen receptor positive. I discussed with the patient and family members present today initial surgical treatment options. We discussed options of breast conservation with lumpectomy or total mastectomy and sentinal lymph node biopsy/dissection. Options for reconstruction were discussed. After discussion they have elected to proceed with right partial mastectomy.  We discussed the indications and nature of the procedure, and expected recovery, in detail. Surgical risks including anesthetic complications, cardiorespiratory complications, bleeding, infection, wound healing complications, blood clots, lymphedema, local and distant recurrence and possible need for further surgery based on the final pathology was discussed and understood.  Chemotherapy, hormonal therapy and radiation therapy have been discussed. They have been provided with literature regarding the treatment of breast cancer.  All questions were answered. They understand and agree to proceed and we will go ahead with scheduling.  Plan Needle localized right partial mastectomy. Due to the open wound and mild infection on going to plan her surgery for about 6 weeks. She'll continue wound care with dressing changes and topical antibiotic over a 10 day course of Keflex. I will see her back in 4 weeks to make sure the wound is healed before proceeding with polypectomy.  Edward Jolly  MD, FACS  11/11/2013, 10:24 AM

## 2013-12-09 ENCOUNTER — Ambulatory Visit (INDEPENDENT_AMBULATORY_CARE_PROVIDER_SITE_OTHER): Payer: Medicare Other | Admitting: General Surgery

## 2013-12-09 ENCOUNTER — Encounter (HOSPITAL_BASED_OUTPATIENT_CLINIC_OR_DEPARTMENT_OTHER): Payer: Self-pay | Admitting: *Deleted

## 2013-12-09 ENCOUNTER — Encounter (INDEPENDENT_AMBULATORY_CARE_PROVIDER_SITE_OTHER): Payer: Self-pay | Admitting: General Surgery

## 2013-12-09 VITALS — BP 146/74 | HR 76 | Temp 98.1°F | Resp 14 | Ht 62.0 in | Wt 263.6 lb

## 2013-12-09 DIAGNOSIS — D0590 Unspecified type of carcinoma in situ of unspecified breast: Secondary | ICD-10-CM

## 2013-12-09 DIAGNOSIS — D0510 Intraductal carcinoma in situ of unspecified breast: Secondary | ICD-10-CM

## 2013-12-09 NOTE — Progress Notes (Signed)
Chief complaint: Preop visit prior to planned right breast lumpectomy  History: Patient returns to the office with a history of recent diagnosis of 1.6 cm area of ductal carcinoma in situ of the right breast. At her initial evaluation she had some evidence of cellulitis and skin breakdown around the core biopsy site and I asked her to return for a followup visit prior to her surgery which is scheduled for next week. She feels the area is doing much better.  Past Medical History  Diagnosis Date  . Allergy   . Cancer   . Diabetes mellitus without complication   . Thyroid disease   . Hypertension   . Wears glasses   . HOH (hard of hearing)   . Arthritis   . Gout    Past Surgical History  Procedure Laterality Date  . Gallbladder sugery    . Thyroid surgery    . Cholecystectomy    . Tubal ligation    . Finger surgery      right hand  . Shoulder arthroscopy  2011    left  . Colonoscopy     Current Outpatient Prescriptions  Medication Sig Dispense Refill  . amLODipine (NORVASC) 5 MG tablet Take 5 mg by mouth daily.      . febuxostat (ULORIC) 40 MG tablet Take 40 mg by mouth daily. The pt is taking this as a trial medication so it could be a placebo for gout      . levothyroxine (SYNTHROID, LEVOTHROID) 100 MCG tablet Take 100 mcg by mouth daily before breakfast.      . lisinopril-hydrochlorothiazide (PRINZIDE,ZESTORETIC) 10-12.5 MG per tablet Take 1 tablet by mouth daily.      . pioglitazone (ACTOS) 30 MG tablet Take 30 mg by mouth daily.       No current facility-administered medications for this visit.   Allergies  Allergen Reactions  . Codeine Rash   Exam: BP 146/74  Pulse 76  Temp(Src) 98.1 F (36.7 C) (Oral)  Resp 14  Ht 5\' 2"  (1.575 m)  Wt 263 lb 9.6 oz (119.568 kg)  BMI 48.20 kg/m2 General: Morbidly obese F. American female in no distress Skin: No rash or infection Lungs: Clear compressive have increased work of breathing Cardiac: Regular rate and rhythm without  murmurs Breasts: There is an approximately 8 mm clean open wound in the medial right breast with no evidence of drainage or infection or cellulitis. Again no palpable masses.  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 She had some infection of the core biopsy site which is now completely cleared up. I believe she is okay to proceed with surgery as planned. We discussed the procedure again all her questions were answered.

## 2013-12-09 NOTE — Progress Notes (Signed)
12/09/13 1546  OBSTRUCTIVE SLEEP APNEA  Have you ever been diagnosed with sleep apnea through a sleep study? No  Do you snore loudly (loud enough to be heard through closed doors)?  1  Do you often feel tired, fatigued, or sleepy during the daytime? 0  Has anyone observed you stop breathing during your sleep? 0  Do you have, or are you being treated for high blood pressure? 1  BMI more than 35 kg/m2? 1  Age over 77 years old? 1  Neck circumference greater than 40 cm/18 inches? 1  Gender: 0  Obstructive Sleep Apnea Score 5  Score 4 or greater  Results sent to PCP

## 2013-12-09 NOTE — Progress Notes (Signed)
To come in for [id number]  

## 2013-12-12 ENCOUNTER — Encounter (HOSPITAL_BASED_OUTPATIENT_CLINIC_OR_DEPARTMENT_OTHER)
Admission: RE | Admit: 2013-12-12 | Discharge: 2013-12-12 | Disposition: A | Discharge status: Home / Self Care | Payer: Medicare Other | Source: Ambulatory Visit | Attending: General Surgery | Admitting: General Surgery

## 2013-12-12 DIAGNOSIS — Z01812 Encounter for preprocedural laboratory examination: Secondary | ICD-10-CM | POA: Insufficient documentation

## 2013-12-12 DIAGNOSIS — Z01810 Encounter for preprocedural cardiovascular examination: Secondary | ICD-10-CM | POA: Insufficient documentation

## 2013-12-12 LAB — BASIC METABOLIC PANEL
BUN: 28 mg/dL — ABNORMAL HIGH (ref 6–23)
CO2: 25 mEq/L (ref 19–32)
Calcium: 9.8 mg/dL (ref 8.4–10.5)
Chloride: 106 mEq/L (ref 96–112)
Creatinine, Ser: 1.36 mg/dL — ABNORMAL HIGH (ref 0.50–1.10)
GFR calc Af Amer: 43 mL/min — ABNORMAL LOW (ref >90)
GFR calc non Af Amer: 37 mL/min — ABNORMAL LOW (ref >90)
Glucose, Bld: 103 mg/dL — ABNORMAL HIGH (ref 70–99)
Potassium: 4.4 mEq/L (ref 3.7–5.3)
Sodium: 145 mEq/L (ref 137–147)

## 2013-12-12 NOTE — Progress Notes (Signed)
I sent Dr. Excell Seltzer e-mail to review labs from today - BUN 28, Creat. 1.36.

## 2013-12-16 ENCOUNTER — Encounter (HOSPITAL_BASED_OUTPATIENT_CLINIC_OR_DEPARTMENT_OTHER): Payer: Self-pay | Admitting: Certified Registered"

## 2013-12-16 ENCOUNTER — Ambulatory Visit (HOSPITAL_BASED_OUTPATIENT_CLINIC_OR_DEPARTMENT_OTHER): Payer: Medicare Other | Admitting: Certified Registered"

## 2013-12-16 ENCOUNTER — Ambulatory Visit (HOSPITAL_BASED_OUTPATIENT_CLINIC_OR_DEPARTMENT_OTHER)
Admission: RE | Admit: 2013-12-16 | Discharge: 2013-12-16 | Disposition: A | Discharge status: Home / Self Care | Payer: Medicare Other | Source: Ambulatory Visit | Attending: General Surgery | Admitting: General Surgery

## 2013-12-16 ENCOUNTER — Encounter (HOSPITAL_BASED_OUTPATIENT_CLINIC_OR_DEPARTMENT_OTHER)
Admission: RE | Disposition: A | Discharge status: Home / Self Care | Payer: Self-pay | Source: Ambulatory Visit | Attending: General Surgery

## 2013-12-16 ENCOUNTER — Ambulatory Visit
Admission: RE | Admit: 2013-12-16 | Discharge: 2013-12-16 | Disposition: A | Discharge status: Home / Self Care | Payer: Medicare Other | Source: Ambulatory Visit | Attending: General Surgery | Admitting: General Surgery

## 2013-12-16 ENCOUNTER — Encounter (HOSPITAL_BASED_OUTPATIENT_CLINIC_OR_DEPARTMENT_OTHER): Payer: Medicare Other | Admitting: Certified Registered"

## 2013-12-16 DIAGNOSIS — D0590 Unspecified type of carcinoma in situ of unspecified breast: Secondary | ICD-10-CM

## 2013-12-16 DIAGNOSIS — E079 Disorder of thyroid, unspecified: Secondary | ICD-10-CM | POA: Insufficient documentation

## 2013-12-16 DIAGNOSIS — I10 Essential (primary) hypertension: Secondary | ICD-10-CM | POA: Insufficient documentation

## 2013-12-16 DIAGNOSIS — E119 Type 2 diabetes mellitus without complications: Secondary | ICD-10-CM | POA: Insufficient documentation

## 2013-12-16 DIAGNOSIS — D0510 Intraductal carcinoma in situ of unspecified breast: Secondary | ICD-10-CM

## 2013-12-16 DIAGNOSIS — D0511 Intraductal carcinoma in situ of right breast: Secondary | ICD-10-CM

## 2013-12-16 DIAGNOSIS — M109 Gout, unspecified: Secondary | ICD-10-CM | POA: Insufficient documentation

## 2013-12-16 HISTORY — DX: Unspecified hearing loss, unspecified ear: H91.90

## 2013-12-16 HISTORY — DX: Gout, unspecified: M10.9

## 2013-12-16 HISTORY — PX: BREAST LUMPECTOMY WITH NEEDLE LOCALIZATION: SHX5759

## 2013-12-16 HISTORY — DX: Unspecified osteoarthritis, unspecified site: M19.90

## 2013-12-16 HISTORY — DX: Presence of spectacles and contact lenses: Z97.3

## 2013-12-16 HISTORY — PX: BREAST LUMPECTOMY: SHX2

## 2013-12-16 LAB — POCT HEMOGLOBIN-HEMACUE: Hemoglobin: 11.8 g/dL — ABNORMAL LOW (ref 12.0–15.0)

## 2013-12-16 LAB — GLUCOSE, CAPILLARY
Glucose-Capillary: 89 mg/dL (ref 70–99)
Glucose-Capillary: 111 mg/dL — ABNORMAL HIGH (ref 70–99)

## 2013-12-16 SURGERY — BREAST LUMPECTOMY WITH NEEDLE LOCALIZATION
Anesthesia: General | Site: Breast | Laterality: Right

## 2013-12-16 MED ORDER — HYDROCODONE-ACETAMINOPHEN 5-325 MG PO TABS: 1.0000 | ORAL_TABLET | ORAL | Status: DC | PRN

## 2013-12-16 MED ORDER — MIDAZOLAM HCL 2 MG/2ML IJ SOLN: 1.0000 mg | INTRAMUSCULAR | Status: DC | PRN

## 2013-12-16 MED ORDER — EPHEDRINE SULFATE 50 MG/ML IJ SOLN
INTRAMUSCULAR | Status: DC | PRN
  Administered 2013-12-16: 10 mg via INTRAVENOUS

## 2013-12-16 MED ORDER — FENTANYL CITRATE 0.05 MG/ML IJ SOLN
INTRAMUSCULAR | Status: DC | PRN
  Administered 2013-12-16 (×2): 50 ug via INTRAVENOUS

## 2013-12-16 MED ORDER — FENTANYL CITRATE 0.05 MG/ML IJ SOLN
50.0000 ug | INTRAMUSCULAR | Status: DC | PRN
Start: 2013-12-16 — End: 2013-12-16

## 2013-12-16 MED ORDER — PROMETHAZINE HCL 25 MG/ML IJ SOLN: 6.2500 mg | INTRAMUSCULAR | Status: DC | PRN

## 2013-12-16 MED ORDER — HYDROMORPHONE HCL PF 1 MG/ML IJ SOLN: 0.2500 mg | INTRAMUSCULAR | Status: DC | PRN

## 2013-12-16 MED ORDER — LIDOCAINE HCL (CARDIAC) 20 MG/ML IV SOLN
INTRAVENOUS | Status: DC | PRN
  Administered 2013-12-16: 60 mg via INTRAVENOUS

## 2013-12-16 MED ORDER — LACTATED RINGERS IV SOLN
INTRAVENOUS | Status: DC
  Administered 2013-12-16 (×2): via INTRAVENOUS

## 2013-12-16 MED ORDER — BUPIVACAINE-EPINEPHRINE 0.5% -1:200000 IJ SOLN
INTRAMUSCULAR | Status: DC | PRN
  Administered 2013-12-16: 20 mL

## 2013-12-16 MED ORDER — HEPARIN SODIUM (PORCINE) 5000 UNIT/ML IJ SOLN
5000.0000 [IU] | Freq: Once | INTRAMUSCULAR | Status: AC
  Administered 2013-12-16: 5000 [IU] via SUBCUTANEOUS

## 2013-12-16 MED ORDER — FENTANYL CITRATE 0.05 MG/ML IJ SOLN
INTRAMUSCULAR | Status: AC
Start: 2013-12-16 — End: 2013-12-16
  Filled 2013-12-16: qty 6

## 2013-12-16 MED ORDER — ONDANSETRON HCL 4 MG/2ML IJ SOLN
INTRAMUSCULAR | Status: DC | PRN
  Administered 2013-12-16: 4 mg via INTRAVENOUS

## 2013-12-16 MED ORDER — CEFAZOLIN SODIUM-DEXTROSE 2-3 GM-% IV SOLR
2.0000 g | INTRAVENOUS | Status: AC
  Administered 2013-12-16: 2 g via INTRAVENOUS

## 2013-12-16 MED ORDER — PROPOFOL 10 MG/ML IV BOLUS
INTRAVENOUS | Status: DC | PRN
  Administered 2013-12-16: 160 mg via INTRAVENOUS

## 2013-12-16 MED ORDER — CEFAZOLIN SODIUM-DEXTROSE 2-3 GM-% IV SOLR
INTRAVENOUS | Status: AC
  Filled 2013-12-16: qty 50

## 2013-12-16 MED ORDER — DEXAMETHASONE SODIUM PHOSPHATE 4 MG/ML IJ SOLN
INTRAMUSCULAR | Status: DC | PRN
  Administered 2013-12-16: 4 mg via INTRAVENOUS

## 2013-12-16 MED ORDER — CHLORHEXIDINE GLUCONATE 4 % EX LIQD: 1.0000 "application " | Freq: Once | CUTANEOUS | Status: DC

## 2013-12-16 MED ORDER — HEPARIN SODIUM (PORCINE) 5000 UNIT/ML IJ SOLN
INTRAMUSCULAR | Status: AC
  Filled 2013-12-16: qty 1

## 2013-12-16 SURGICAL SUPPLY — 52 items
ADH SKN CLS APL DERMABOND .7 (GAUZE/BANDAGES/DRESSINGS) ×1
BLADE SURG 10 STRL SS (BLADE) IMPLANT
BLADE SURG 15 STRL LF DISP TIS (BLADE) ×1 IMPLANT
BLADE SURG 15 STRL SS (BLADE) ×2
CANISTER SUCT 1200ML W/VALVE (MISCELLANEOUS) ×1 IMPLANT
CHLORAPREP W/TINT 26ML (MISCELLANEOUS) ×2 IMPLANT
CLIP TI MEDIUM 6 (CLIP) IMPLANT
CLIP TI WIDE RED SMALL 6 (CLIP) ×1 IMPLANT
COVER MAYO STAND STRL (DRAPES) ×2 IMPLANT
COVER TABLE BACK 60X90 (DRAPES) ×2 IMPLANT
DERMABOND ADVANCED (GAUZE/BANDAGES/DRESSINGS) ×1
DERMABOND ADVANCED .7 DNX12 (GAUZE/BANDAGES/DRESSINGS) IMPLANT
DEVICE DUBIN W/COMP PLATE 8390 (MISCELLANEOUS) ×1 IMPLANT
DRAPE PED LAPAROTOMY (DRAPES) ×2 IMPLANT
DRAPE UTILITY XL STRL (DRAPES) ×2 IMPLANT
ELECT COATED BLADE 2.86 ST (ELECTRODE) ×2 IMPLANT
ELECT REM PT RETURN 9FT ADLT (ELECTROSURGICAL) ×2
ELECTRODE REM PT RTRN 9FT ADLT (ELECTROSURGICAL) ×1 IMPLANT
GLOVE BIOGEL M 7.0 STRL (GLOVE) ×1 IMPLANT
GLOVE BIOGEL PI IND STRL 7.0 (GLOVE) IMPLANT
GLOVE BIOGEL PI IND STRL 7.5 (GLOVE) IMPLANT
GLOVE BIOGEL PI IND STRL 8 (GLOVE) ×1 IMPLANT
GLOVE BIOGEL PI INDICATOR 7.0 (GLOVE) ×2
GLOVE BIOGEL PI INDICATOR 7.5 (GLOVE) ×1
GLOVE BIOGEL PI INDICATOR 8 (GLOVE) ×1
GLOVE ECLIPSE 6.5 STRL STRAW (GLOVE) ×1 IMPLANT
GLOVE SS BIOGEL STRL SZ 7.5 (GLOVE) ×1 IMPLANT
GLOVE SUPERSENSE BIOGEL SZ 7.5 (GLOVE) ×2
GOWN STRL REUS W/ TWL LRG LVL3 (GOWN DISPOSABLE) ×1 IMPLANT
GOWN STRL REUS W/ TWL XL LVL3 (GOWN DISPOSABLE) ×1 IMPLANT
GOWN STRL REUS W/TWL LRG LVL3 (GOWN DISPOSABLE) ×4
GOWN STRL REUS W/TWL XL LVL3 (GOWN DISPOSABLE) ×2
KIT MARKER MARGIN INK (KITS) ×1 IMPLANT
NDL HYPO 25X1 1.5 SAFETY (NEEDLE) ×1 IMPLANT
NEEDLE HYPO 25X1 1.5 SAFETY (NEEDLE) ×2 IMPLANT
NS IRRIG 1000ML POUR BTL (IV SOLUTION) ×2 IMPLANT
PACK BASIN DAY SURGERY FS (CUSTOM PROCEDURE TRAY) ×2 IMPLANT
PENCIL BUTTON HOLSTER BLD 10FT (ELECTRODE) ×2 IMPLANT
SLEEVE SCD COMPRESS KNEE MED (MISCELLANEOUS) ×1 IMPLANT
STAPLER VISISTAT 35W (STAPLE) IMPLANT
SUT MON AB 3-0 SH 27 (SUTURE)
SUT MON AB 3-0 SH27 (SUTURE) IMPLANT
SUT MON AB 5-0 PS2 18 (SUTURE) ×2 IMPLANT
SUT SILK 3 0 SH 30 (SUTURE) IMPLANT
SUT VIC AB 4-0 BRD 54 (SUTURE) IMPLANT
SUT VICRYL 3-0 CR8 SH (SUTURE) ×2 IMPLANT
SYR BULB 3OZ (MISCELLANEOUS) IMPLANT
SYR CONTROL 10ML LL (SYRINGE) ×2 IMPLANT
TOWEL OR 17X24 6PK STRL BLUE (TOWEL DISPOSABLE) ×3 IMPLANT
TOWEL OR NON WOVEN STRL DISP B (DISPOSABLE) ×2 IMPLANT
TUBE CONNECTING 20X1/4 (TUBING) ×1 IMPLANT
YANKAUER SUCT BULB TIP NO VENT (SUCTIONS) ×1 IMPLANT

## 2013-12-16 NOTE — Discharge Instructions (Signed)
[facility] °Office Phone Number [phone] ° °BREAST BIOPSY/ PARTIAL MASTECTOMY: POST OP INSTRUCTIONS ° °Always review your discharge instruction sheet given to you by the facility where your surgery was performed. ° °IF YOU HAVE DISABILITY OR FAMILY LEAVE FORMS, YOU MUST BRING THEM TO THE OFFICE FOR PROCESSING.  DO NOT GIVE THEM TO YOUR DOCTOR. ° °1. A prescription for pain medication may be given to you upon discharge.  Take your pain medication as prescribed, if needed.  If narcotic pain medicine is not needed, then you may take acetaminophen (Tylenol) or ibuprofen (Advil) as needed. °2. Take your usually prescribed medications unless otherwise directed °3. If you need a refill on your pain medication, please contact your pharmacy.  They will contact our office to request authorization.  Prescriptions will not be filled after 5pm or on week-ends. °4. You should eat very light the first 24 hours after surgery, such as soup, crackers, pudding, etc.  Resume your normal diet the day after surgery. °5. Most patients will experience some swelling and bruising in the breast.  Ice packs and a good support bra will help.  Swelling and bruising can take several days to resolve.  °6. It is common to experience some constipation if taking pain medication after surgery.  Increasing fluid intake and taking a stool softener will usually help or prevent this problem from occurring.  A mild laxative (Milk of Magnesia or Miralax) should be taken according to package directions if there are no bowel movements after 48 hours. °7. Unless discharge instructions indicate otherwise, you may remove your bandages 24-48 hours after surgery, and you may shower at that time.  You may have steri-strips (small skin tapes) in place directly over the incision.  These strips should be left on the skin for 7-10 days.  If your surgeon used skin glue on the incision, you may shower in 24 hours.  The glue will flake off over the  next 2-3 weeks.  Any sutures or staples will be removed at the office during your follow-up visit. °8. ACTIVITIES:  You may resume regular daily activities (gradually increasing) beginning the next day.  Wearing a good support bra or sports bra minimizes pain and swelling.  You may have sexual intercourse when it is comfortable. °a. You may drive when you no longer are taking prescription pain medication, you can comfortably wear a seatbelt, and you can safely maneuver your car and apply brakes. °b. RETURN TO WORK:  ______________________________________________________________________________________ °9. You should see your doctor in the office for a follow-up appointment approximately two weeks after your surgery.  Your doctor’s nurse will typically make your follow-up appointment when she calls you with your pathology report.  Expect your pathology report 2-3 business days after your surgery.  You may call to check if you do not hear from us after three days. °10. OTHER INSTRUCTIONS: _______________________________________________________________________________________________ _____________________________________________________________________________________________________________________________________ °_____________________________________________________________________________________________________________________________________ °_____________________________________________________________________________________________________________________________________ ° °WHEN TO CALL YOUR DOCTOR: °1. Fever over 101.0 °2. Nausea and/or vomiting. °3. Extreme swelling or bruising. °4. Continued bleeding from incision. °5. Increased pain, redness, or drainage from the incision. ° °The clinic staff is available to answer your questions during regular business hours.  Please don’t hesitate to call and ask to speak to one of the nurses for clinical concerns.  If you have a medical emergency, go to the nearest  emergency room or call 911.  A surgeon from [facility] is always on call at the hospital. ° °For further questions, please visit [email or web address]  ° ° °  Post Anesthesia Home Care Instructions ° °Activity: °Get plenty of rest for the remainder of the day. A responsible adult should stay with you for 24 hours following the procedure.  °For the next 24 hours, DO NOT: °-Drive a car °-Operate machinery °-Drink alcoholic beverages °-Take any medication unless instructed by your physician °-Make any legal decisions or sign important papers. ° °Meals: °Start with liquid foods such as gelatin or soup. Progress to regular foods as tolerated. Avoid greasy, spicy, heavy foods. If nausea and/or vomiting occur, drink only clear liquids until the nausea and/or vomiting subsides. Call your physician if vomiting continues. ° °Special Instructions/Symptoms: °Your throat may feel dry or sore from the anesthesia or the breathing tube placed in your throat during surgery. If this causes discomfort, gargle with warm salt water. The discomfort should disappear within 24 hours. ° °

## 2013-12-16 NOTE — Anesthesia Postprocedure Evaluation (Signed)
Anesthesia Post Note  Patient: Karla Willis  Procedure(s) Performed: Procedure(s) (LRB): BREAST LUMPECTOMY WITH NEEDLE LOCALIZATION (Right)  Anesthesia type: general  Patient location: PACU  Post pain: Pain level controlled  Post assessment: Patient's Cardiovascular Status Stable  Last Vitals:  Filed Vitals:   12/16/13 1345  BP: 133/65  Pulse:   Temp: 36.5 C  Resp: 16    Post vital signs: Reviewed and stable  Level of consciousness: sedated  Complications: No apparent anesthesia complications

## 2013-12-16 NOTE — Anesthesia Procedure Notes (Signed)
Procedure Name: LMA Insertion Date/Time: 12/16/2013 11:16 AM Performed by: Ameya Vowell Pre-anesthesia Checklist: Patient identified, Emergency Drugs available, Suction available and Patient being monitored Patient Re-evaluated:Patient Re-evaluated prior to inductionOxygen Delivery Method: Circle System Utilized Preoxygenation: Pre-oxygenation with 100% oxygen Intubation Type: IV induction Ventilation: Mask ventilation without difficulty LMA: LMA inserted LMA Size: 4.0 Number of attempts: 1 Airway Equipment and Method: bite block Placement Confirmation: positive ETCO2 Tube secured with: Tape Dental Injury: Teeth and Oropharynx as per pre-operative assessment

## 2013-12-16 NOTE — Op Note (Signed)
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 We have elected to proceed with needle localized excision for surgical treatment. The procedure and risks have been discussed extensively and detailed elsewhere.    Procedure Detail:  Following accurate needle localization the patient was brought to the operating room, placed in supine position on the operating table laryngeal mask anesthesia induced. The right breast was widely sterilely prepped and draped. She received preoperative IV antibiotics. Patient Timeout was performed and correct procedure verified. I made a transverse incision in a skin crease in the medial breast at the wire insertion site and dissection was carried down through the subcutaneous tissue to the breast capsule. Using cautery I then excised a generous specimen of breast tissue around the shaft and the tip of the wire in order to obtain a negative margin. The specimen mammography showed the wire and the clip centrally in the specimen. There was a small hematoma at the deep excision margin and I reexcised the deep margin another half centimeter which was sent as a separate oriented specimen. The original specimen was oriented with ink.  The soft tissue was infiltrated with Marcaine. The cavity was marked with clips. The deep breast tissue and subcutaneous tissue was closed with interrupted 3-0 Vicryl and the skin with subcuticular 4-0 Monocryl and Dermabond. Sponge needle and instrument counts were correct.          Drains: none  Blood Given: none           Specimens: #1 right breast lumpectomy      #2 further deep margin right breast        Complications:  * No complications entered in OR log *         Disposition: PACU - hemodynamically stable.         Condition: stable

## 2013-12-16 NOTE — Anesthesia Preprocedure Evaluation (Addendum)
Anesthesia Evaluation  Patient identified by MRN, date of birth, ID band Patient awake    Reviewed: Allergy & Precautions, H&P , NPO status , Patient's Chart, lab work & pertinent test results, reviewed documented beta blocker date and time   History of Anesthesia Complications Negative for: history of anesthetic complications  Airway Mallampati: II TM Distance: >3 FB Neck ROM: full    Dental  (+) Poor Dentition, Chipped and Dental Advidsory Given   Pulmonary neg pulmonary ROS,  breath sounds clear to auscultation        Cardiovascular hypertension, Pt. on medications negative cardio ROS  Rhythm:regular Rate:Normal     Neuro/Psych negative neurological ROS  negative psych ROS   GI/Hepatic negative GI ROS, Neg liver ROS,   Endo/Other  negative endocrine ROSdiabetesHypothyroidism Morbid obesity  Renal/GU negative Renal ROS     Musculoskeletal   Abdominal   Peds  Hematology   Anesthesia Other Findings   Reproductive/Obstetrics negative OB ROS                          Anesthesia Physical Anesthesia Plan  ASA: III  Anesthesia Plan: General   Post-op Pain Management:    Induction: Intravenous  Airway Management Planned: LMA  Additional Equipment:   Intra-op Plan:   Post-operative Plan: Extubation in OR  Informed Consent:   Plan Discussed with: CRNA, Anesthesiologist and Surgeon  Anesthesia Plan Comments:         Anesthesia Quick Evaluation

## 2013-12-16 NOTE — Interval H&P Note (Signed)
History and Physical Interval Note:  12/16/2013 11:06 AM  Karla Willis  has presented today for surgery, with the diagnosis of DCIS rt breast  The various methods of treatment have been discussed with the patient and family. After consideration of risks, benefits and other options for treatment, the patient has consented to  Procedure(s): BREAST LUMPECTOMY WITH NEEDLE LOCALIZATION (Right) as a surgical intervention .  The patient's history has been reviewed, patient examined, no change in status, stable for surgery.  I have reviewed the patient's chart and labs.  Questions were answered to the patient's satisfaction.     [name] T

## 2013-12-16 NOTE — H&P (View-Only) (Signed)
Chief complaint: Preop visit prior to planned right breast lumpectomy  History: Patient returns to the office with a history of recent diagnosis of 1.6 cm area of ductal carcinoma in situ of the right breast. At her initial evaluation she had some evidence of cellulitis and skin breakdown around the core biopsy site and I asked her to return for a followup visit prior to her surgery which is scheduled for next week. She feels the area is doing much better.  Past Medical History  Diagnosis Date  . Allergy   . Cancer   . Diabetes mellitus without complication   . Thyroid disease   . Hypertension   . Wears glasses   . HOH (hard of hearing)   . Arthritis   . Gout    Past Surgical History  Procedure Laterality Date  . Gallbladder sugery    . Thyroid surgery    . Cholecystectomy    . Tubal ligation    . Finger surgery      right hand  . Shoulder arthroscopy  2011    left  . Colonoscopy     Current Outpatient Prescriptions  Medication Sig Dispense Refill  . amLODipine (NORVASC) 5 MG tablet Take 5 mg by mouth daily.      . febuxostat (ULORIC) 40 MG tablet Take 40 mg by mouth daily. The pt is taking this as a trial medication so it could be a placebo for gout      . levothyroxine (SYNTHROID, LEVOTHROID) 100 MCG tablet Take 100 mcg by mouth daily before breakfast.      . lisinopril-hydrochlorothiazide (PRINZIDE,ZESTORETIC) 10-12.5 MG per tablet Take 1 tablet by mouth daily.      . pioglitazone (ACTOS) 30 MG tablet Take 30 mg by mouth daily.       No current facility-administered medications for this visit.   Allergies  Allergen Reactions  . Codeine Rash   Exam: BP 146/74  Pulse 76  Temp(Src) 98.1 F (36.7 C) (Oral)  Resp 14  Ht 5' 2" (1.575 m)  Wt 263 lb 9.6 oz (119.568 kg)  BMI 48.20 kg/m2 General: Morbidly obese F. [employer] female in no distress Skin: No rash or infection Lungs: Clear compressive have increased work of breathing Cardiac: Regular rate and rhythm without  murmurs Breasts: There is an approximately 8 mm clean open wound in the medial right breast with no evidence of drainage or infection or cellulitis. Again no palpable masses.  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 She had some infection of the core biopsy site which is now completely cleared up. I believe she is okay to proceed with surgery as planned. We discussed the procedure again all her questions were answered. 

## 2013-12-16 NOTE — Transfer of Care (Signed)
Immediate Anesthesia Transfer of Care Note  Patient: Karla Willis  Procedure(s) Performed: Procedure(s): BREAST LUMPECTOMY WITH NEEDLE LOCALIZATION (Right)  Patient Location: PACU  Anesthesia Type:General  Level of Consciousness: awake, alert , oriented and patient cooperative  Airway & Oxygen Therapy: Patient Spontanous Breathing and Patient connected to face mask oxygen  Post-op Assessment: Report given to PACU RN and Post -op Vital signs reviewed and stable  Post vital signs: Reviewed and stable  Complications: No apparent anesthesia complications

## 2013-12-19 ENCOUNTER — Encounter (HOSPITAL_BASED_OUTPATIENT_CLINIC_OR_DEPARTMENT_OTHER): Payer: Self-pay | Admitting: General Surgery

## 2013-12-21 ENCOUNTER — Telehealth (INDEPENDENT_AMBULATORY_CARE_PROVIDER_SITE_OTHER): Payer: Self-pay

## 2013-12-21 NOTE — Telephone Encounter (Signed)
Pt calling for path result. Pt advised I will send request to Dr Excell Seltzer and his assistant. Pt can be reached at [phone].

## 2013-12-22 ENCOUNTER — Telehealth (INDEPENDENT_AMBULATORY_CARE_PROVIDER_SITE_OTHER): Payer: Self-pay | Admitting: General Surgery

## 2013-12-22 NOTE — Telephone Encounter (Signed)
Cold patient and discuss pathology report. All DCIS. Focally 1 mm from one margin but should be okay with no further surgery. Will make referral to cancer center.

## 2013-12-23 ENCOUNTER — Other Ambulatory Visit (INDEPENDENT_AMBULATORY_CARE_PROVIDER_SITE_OTHER): Payer: Self-pay

## 2013-12-23 DIAGNOSIS — C50919 Malignant neoplasm of unspecified site of unspecified female breast: Secondary | ICD-10-CM

## 2013-12-27 ENCOUNTER — Telehealth: Payer: Self-pay | Admitting: *Deleted

## 2013-12-27 NOTE — Telephone Encounter (Signed)
Received referral in [place] and gave paperwork to Dr. Humphrey Rolls for an appt.  Emailed Christy at [employer] to make her aware.  Emailed Santiago Glad in [place] to make her aware too.

## 2013-12-27 NOTE — Telephone Encounter (Signed)
Called and spoke with patient and confirmed appt for 01/05/14 for 12N lab,fin and 0165 with Dr. Humphrey Rolls.  Informed patient we would mail her a packet and to have it filled out before her appointment.  Patient verbalized understanding.

## 2014-01-02 ENCOUNTER — Telehealth: Payer: Self-pay | Admitting: *Deleted

## 2014-01-02 DIAGNOSIS — C50311 Malignant neoplasm of lower-inner quadrant of right female breast: Secondary | ICD-10-CM

## 2014-01-02 NOTE — Telephone Encounter (Signed)
R/s new pt appt per Dr. Humphrey Rolls to 2/24.  Confirmed new appt date and time for 01/03/14 at 3:30/4:00.  Pt denies further needs.

## 2014-01-03 ENCOUNTER — Other Ambulatory Visit (HOSPITAL_BASED_OUTPATIENT_CLINIC_OR_DEPARTMENT_OTHER): Payer: Medicare Other

## 2014-01-03 ENCOUNTER — Encounter: Payer: Self-pay | Admitting: *Deleted

## 2014-01-03 ENCOUNTER — Encounter: Payer: Self-pay | Admitting: Oncology

## 2014-01-03 ENCOUNTER — Ambulatory Visit: Payer: Medicare Other

## 2014-01-03 ENCOUNTER — Ambulatory Visit (HOSPITAL_BASED_OUTPATIENT_CLINIC_OR_DEPARTMENT_OTHER): Payer: Medicare Other | Admitting: Oncology

## 2014-01-03 VITALS — BP 159/74 | HR 89 | Temp 98.4°F | Resp 18 | Ht 62.0 in | Wt 265.0 lb

## 2014-01-03 DIAGNOSIS — Z170 Estrogen receptor positive status [ER+]: Secondary | ICD-10-CM

## 2014-01-03 DIAGNOSIS — D0510 Intraductal carcinoma in situ of unspecified breast: Secondary | ICD-10-CM

## 2014-01-03 DIAGNOSIS — C50311 Malignant neoplasm of lower-inner quadrant of right female breast: Secondary | ICD-10-CM

## 2014-01-03 DIAGNOSIS — D0590 Unspecified type of carcinoma in situ of unspecified breast: Secondary | ICD-10-CM

## 2014-01-03 LAB — CBC WITH DIFFERENTIAL/PLATELET
BASO%: 0.7 % (ref 0.0–2.0)
Basophils Absolute: 0 10*3/uL (ref 0.0–0.1)
EOS%: 3.5 % (ref 0.0–7.0)
Eosinophils Absolute: 0.2 10*3/uL (ref 0.0–0.5)
HCT: 34.9 % (ref 34.8–46.6)
HGB: 11.5 g/dL — ABNORMAL LOW (ref 11.6–15.9)
LYMPH%: 25.7 % (ref 14.0–49.7)
MCH: 29.3 pg (ref 25.1–34.0)
MCHC: 33 g/dL (ref 31.5–36.0)
MCV: 88.7 fL (ref 79.5–101.0)
MONO#: 0.7 10*3/uL (ref 0.1–0.9)
MONO%: 13.1 % (ref 0.0–14.0)
NEUT#: 3 10*3/uL (ref 1.5–6.5)
NEUT%: 57 % (ref 38.4–76.8)
Platelets: 192 10*3/uL (ref 145–400)
RBC: 3.94 10*6/uL (ref 3.70–5.45)
RDW: 14.5 % (ref 11.2–14.5)
WBC: 5.2 10*3/uL (ref 3.9–10.3)
lymph#: 1.3 10*3/uL (ref 0.9–3.3)

## 2014-01-03 LAB — COMPREHENSIVE METABOLIC PANEL (CC13)
ALT: 11 U/L (ref 0–55)
AST: 15 U/L (ref 5–34)
Albumin: 3.6 g/dL (ref 3.5–5.0)
Alkaline Phosphatase: 58 U/L (ref 40–150)
Anion Gap: 9 mEq/L (ref 3–11)
BUN: 20.6 mg/dL (ref 7.0–26.0)
CO2: 25 mEq/L (ref 22–29)
Calcium: 9.8 mg/dL (ref 8.4–10.4)
Chloride: 109 mEq/L (ref 98–109)
Creatinine: 1.3 mg/dL — ABNORMAL HIGH (ref 0.6–1.1)
Glucose: 136 mg/dl (ref 70–140)
Potassium: 4 mEq/L (ref 3.5–5.1)
Sodium: 143 mEq/L (ref 136–145)
Total Bilirubin: 0.27 mg/dL (ref 0.20–1.20)
Total Protein: 7.2 g/dL (ref 6.4–8.3)

## 2014-01-03 MED ORDER — TAMOXIFEN CITRATE 20 MG PO TABS: 20.0000 mg | ORAL_TABLET | Freq: Every day | ORAL | Status: AC

## 2014-01-03 NOTE — Progress Notes (Signed)
Checked in new pt with no financial concerns. °

## 2014-01-03 NOTE — Patient Instructions (Signed)

## 2014-01-03 NOTE — Progress Notes (Signed)
Karla Willis [id number] Apr 30, 1935 79 y.o. 01/03/2014 4:17 PM  CC  Elyn Peers, MD [address] [place] [address] Dr. Excell Seltzer Dr. 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 Marland Kitchen Hoxworth  HISTORY OF PRESENT ILLNESS:  Karla Willis is a 79 y.o. female who on a recent screening mammogram found to have 1.5 cm cluster of calcification in the right breast. Diagnostic mammogram confirmed the calcifications. On 10/19/13 patient underwent biopsy which showed DCIS. Tumor was ER+/PR+, low grade. She was seen by Dr. Excell Seltzer who took her to the OR on 12/16/13 for a lumpectomy of the right breast. The final pathology showed a DCIS with calcifications, low grade, 1.6 cm. The DCIS was focally o.1 cm to the lateral margin. Excision of the right posterior margin showed DCIS, low grade, scattered foci, the surgical resection margin negative for DCIS. Tumor ER 100%, PR 60%. Final staging TisNx. Post op patient is doing well. She is without any complaints. She has healed well from her recent surgery.   Past Medical History: Past Medical History  Diagnosis Date  . Allergy   . Cancer   . Diabetes mellitus without complication   . Thyroid disease   . Hypertension   . Wears glasses   . HOH (hard of hearing)   . Arthritis   . Gout     Past Surgical History: Past Surgical History  Procedure Laterality Date  . Gallbladder sugery    . Thyroid surgery    . Cholecystectomy    . Tubal ligation    . Finger surgery      right hand  . Shoulder arthroscopy  2011    left  . Colonoscopy    . Breast lumpectomy with needle localization Right 12/16/2013    Procedure: BREAST  LUMPECTOMY WITH NEEDLE LOCALIZATION;  Surgeon: Edward Jolly, MD;  Location: [facility];  Service: General;  Laterality: Right;    Family History: History reviewed. No pertinent family history.  Social History History  Substance Use Topics  . Smoking status: Never Smoker   . Smokeless tobacco: Not on file  . Alcohol Use: No    Allergies: Allergies  Allergen Reactions  . Codeine Rash    Current Medications: Current Outpatient Prescriptions  Medication Sig Dispense Refill  . amLODipine (NORVASC) 5 MG tablet Take 5 mg by mouth daily.      Marland Kitchen HYDROcodone-acetaminophen (NORCO/VICODIN) 5-325 MG per tablet Take 1-2 tablets by mouth every 4 (four) hours as needed.  30 tablet  0  . levothyroxine (SYNTHROID, LEVOTHROID) 100 MCG tablet Take 100 mcg by mouth daily before breakfast.      . lisinopril-hydrochlorothiazide (PRINZIDE,ZESTORETIC) 10-12.5 MG per tablet Take 1 tablet by mouth daily.      . pioglitazone (ACTOS) 30 MG tablet Take 30 mg by mouth daily.       No current facility-administered medications for this visit.    OB/GYN History: menarche at 57, menopause at 76, G55P4, age at first live birth 70. No HRT  Fertility Discussion: N/A Prior History of Cancer: no  Health Maintenance:  Colonoscopy yes  Bone Density 304 years ago  Last PAP smear yes  ECOG PERFORMANCE STATUS: 1 - Symptomatic but completely ambulatory  Genetic Counseling/testing: no  REVIEW OF SYSTEMS:  A comprehensive review of systems was negative.  PHYSICAL EXAMINATION: Blood pressure 159/74, pulse 89, temperature 98.4 F (36.9 C), temperature source Oral, resp. rate 18, height '5\' 2"'  (1.575 m), weight 265 lb (120.203 kg).  General:  well-nourished in no acute distress.  Eyes:  no scleral icterus.  ENT:  There were no oropharyngeal lesions.  Neck was without thyromegaly.  Lymphatics:  Negative cervical, supraclavicular or axillary adenopathy.  Respiratory: lungs were clear bilaterally  without wheezing or crackles.  Cardiovascular:  Regular rate and rhythm, S1/S2, without murmur, rub or gallop.  There was no pedal edema.  GI:  abdomen was soft, flat, nontender, nondistended, without organomegaly.  Muscoloskeletal:  no spinal tenderness of palpation of vertebral spine.  Skin exam was without echymosis, petichae.  Neuro exam was nonfocal.  Patient was able to get on and off exam table without assistance.  Gait was normal.  Patient was alerted and oriented.  Attention was good.   Language was appropriate.  Mood was normal without depression.  Speech was not pressured.  Thought content was not tangential.   Breasts: right breast normal without mass, skin or nipple changes or axillary nodes healed surgical sscar from lumpectomy, left breast normal without mass, skin or nipple changes or axillary nodes.   STUDIES/RESULTS: Mm Rt Plc Breast Loc Dev   1st Lesion  Inc Mammo Guide  12/16/2013   CLINICAL DATA:  The patient presents for needle localization of the right breast. Recent stereotactic guided core biopsy shows ductal carcinoma in situ.  EXAM: NEEDLE LOCALIZATION OF THE right BREAST WITH MAMMO GUIDANCE  COMPARISON:  Previous exams.  FINDINGS: Patient presents for needle localization prior to lumpectomy. I met with the patient and we discussed the procedure of needle localization including benefits and alternatives. We discussed the high likelihood of a successful procedure. We discussed the risks of the procedure, including infection, bleeding, tissue injury, and further surgery. Informed, written consent was given. The usual time-out protocol was performed immediately prior to the procedure.  Using mammographic guidance, sterile technique, 2% lidocaine and a 9 cm modified Kopans needle, calcifications and clip in the medial portion of the right breast are localized using medial approach. The films were marked for Dr. Tobie Poet fourth.  Specimen radiograph was performed confirms few residual  calcifications, clip, and wire to be present in the tissue sample. The specimen was marked for pathology.  IMPRESSION: Needle localization of the right breast. No apparent complications.   Electronically Signed   By: Shon Hale M.D.   On: 12/16/2013 12:50     LABS:    Chemistry      Component Value Date/Time   NA 145 12/12/2013 1335   K 4.4 12/12/2013 1335   CL 106 12/12/2013 1335   CO2 25 12/12/2013 1335   BUN 28* 12/12/2013 1335   CREATININE 1.36* 12/12/2013 1335      Component Value Date/Time   CALCIUM 9.8 12/12/2013 1335      Lab Results  Component Value Date   WBC 5.2 01/03/2014   HGB 11.5* 01/03/2014   HCT 34.9 01/03/2014   MCV 88.7 01/03/2014   PLT 192 01/03/2014   PATHOLOGY: 12/16/13 FINAL DIAGNOSIS Diagnosis 1. Breast, lumpectomy, Right - DUCTAL CARCINOMA IN SITU WITH CALCIFICATIONS, LOW GRADE, SPANNING 1.6 CM. - DUCTAL CARCINOMA IS FOCALLY 0.1 CM TO THE LATERAL MARGIN OF SPECIMEN # 1. -  SEE ONCOLOGY TABLE BELOW. 2. Breast, excision, Right posterior margin - DUCTAL CARCINOMA IN SITU, LOW GRADE, SCATTERED FOCI. - THE SURGICAL RESECTION MARGINS ARE NEGATIVE FOR DUCTAL CARCINOMA. Microscopic Comment 1. BREAST, IN SITU CARCINOMA Specimen, including laterality: Right breast. Procedure (include lymph node sampling sentinel-non-sentinel: Lumpectomy and additional posterior margin resection. Grade of carcinoma: Low grade Necrosis: Not identified. Estimated tumor size: (gross measurement): at least 1.6 cm Treatment effect: N/A Distance to closest margin: Ductal carcinoma in situ is focally 0.1 cm to the lateral margin of specimen # 1. Breast prognostic profile: [id number] Estrogen receptor: 100%, strong staining intensity. Progesterone receptor: 60%, strong staining intensity. Lymph nodes: None examined. TNM: pTis, pNX. (JBK:gt, 12/19/13) [name] Cutter MD Pathologist, Electronic Signature (Case signed 12/19/2013) Specimen Gross and Clinical Information ASSESSMENT/PLAN  79  year old female with   1. Stage 0 (TisNx) high grade DCIS of the right/left breast found on screening mammogram. DCIS The DCIS was ER+/PR+/ s/p lumpectomy by Dr. Excell Seltzer   2. We spent the better part of today's hour-long appointment discussing the biology of breast cancer in general, and the specifics of the patient's tumor in particular. We discussed the multidisciplinary approach to breast cancer treatment. We went over her pathology. She understands that her cancer is a non invasive disease. She is s/p surgery,consisting of lumpectomy.she will be seen by  Radiation Oncology next week. We discussed the role of adjuvant anti-estrogen therapy to help prevent future ER+ breast cancer in the ipsilateral and contralateral breasts. We discussed the different drugs available including tamoxifen. Which would be 20 mg daily for a total of 5 years. We also discussed aromasin 25 mg daily for 5 years. We discussed the side effects of both medications.she will not begin anti-estrogen therapy until after completion of radiation therapy  3. Patient and I discussed the possibility of started the tamoxifen. However she needs to be seen by Dr. Valere Dross prior to intiation of tamoxifen. The question would be whether or not he feels that the RT first appropriate. She will wait to here his recommendation. She will hold onto her prescription until she has had a RT    Discussion: Patient is being treated per NCCN breast cancer care guidelines appropriate for stage.0   Thank you so much for allowing me to participate in the care of HENNY STRAUCH. I will continue to follow up the patient with you and assist in her care.  All questions were answered. The patient knows to call the clinic with any problems, questions or concerns. We can certainly see the patient much sooner if necessary.  I spent 40 minutes counseling the patient face to face. The total time spent in the appointment was 60 minutes.  Marcy Panning,  MD [facility] [phone] (beeper) [phone] (Office)  01/03/2014, 4:17 PM

## 2014-01-03 NOTE — Progress Notes (Signed)
Completed chart, added to spreadsheet & placed chart on Dr. Khan's desk. 

## 2014-01-04 ENCOUNTER — Telehealth: Payer: Self-pay | Admitting: Oncology

## 2014-01-04 ENCOUNTER — Telehealth: Payer: Self-pay | Admitting: *Deleted

## 2014-01-04 NOTE — Telephone Encounter (Signed)
Pt states her 1st pair of diabetic shoes didn't fit, and is checking on the replacement pair.

## 2014-01-04 NOTE — Telephone Encounter (Signed)
S/w the pt and she is aware of her July appts 

## 2014-01-05 ENCOUNTER — Ambulatory Visit: Payer: Medicare Other | Admitting: Oncology

## 2014-01-05 ENCOUNTER — Other Ambulatory Visit: Payer: Medicare Other

## 2014-01-05 ENCOUNTER — Ambulatory Visit: Payer: Medicare Other

## 2014-01-06 ENCOUNTER — Encounter: Payer: Self-pay | Admitting: *Deleted

## 2014-01-06 DIAGNOSIS — C50919 Malignant neoplasm of unspecified site of unspecified female breast: Secondary | ICD-10-CM | POA: Insufficient documentation

## 2014-01-06 NOTE — Telephone Encounter (Signed)
SCHEDULED PATIENT APPOINTMENT 01/20/14 @ 2:30 PM

## 2014-01-06 NOTE — Progress Notes (Signed)
Location of Breast Cancer: right upper inner  Histology per Pathology Report:  12/16/13 Diagnosis 1. Breast, lumpectomy, Right - DUCTAL CARCINOMA IN SITU WITH CALCIFICATIONS, LOW GRADE, SPANNING 1.6 CM. - DUCTAL CARCINOMA IS FOCALLY 0.1 CM TO THE LATERAL MARGIN OF SPECIMEN # 1. - SEE ONCOLOGY TABLE BELOW. 2. Breast, excision, Right posterior margin - DUCTAL CARCINOMA IN SITU, LOW GRADE, SCATTERED FOCI. - THE SURGICAL RESECTION MARGINS ARE NEGATIVE FOR DUCTAL CARCINOMA.  10/24/13 Diagnosis Breast, right, needle core biopsy, 2:30 o'clock, 6 cm / nipple - MAMMARY CARCINOMA IN SITU WITH CALCIFICATIONS.  Receptor Status: ER(100%), PR (60%), Her2-neu ()  Did patient present with symptoms (if so, please note symptoms) or was this found on screening mammography?: screening mammogram  Past/Anticipated interventions by surgeon, if any: 12/16/13  right lumpectomy, excision right posterior margin  Past/Anticipated interventions by medical oncology, if any: Chemotherapy  Dr Humphrey Rolls- 01/03/14  1. Stage 0 (TisNx) high grade DCIS of the right/left breast found on screening mammogram. DCIS The DCIS was ER+/PR+/ s/p lumpectomy by Dr. Excell Seltzer  2. We spent the better part of today's hour-long appointment discussing the biology of breast cancer in general, and the specifics of the patient's tumor in particular. We discussed the multidisciplinary approach to breast cancer treatment. We went over her pathology. She understands that her cancer is a non invasive disease. She is s/p surgery,consisting of lumpectomy.she will be seen by Radiation Oncology next week. We discussed the role of adjuvant anti-estrogen therapy to help prevent future ER+ breast cancer in the ipsilateral and contralateral breasts. We discussed the different drugs available including tamoxifen. Which would be 20 mg daily for a total of 5 years. We also discussed aromasin 25 mg daily for 5 years. We discussed the side effects of both medications.she  will not begin anti-estrogen therapy until after completion of radiation therapy  3. Patient and I discussed the possibility of started the tamoxifen. However she needs to be seen by Dr. Valere Dross prior to intiation of tamoxifen. The question would be whether or not he feels that the RT first appropriate. She will wait to here his recommendation. She will hold onto her prescription until she has had a RT  Lymphedema issues, if any:  no  Pain issues, if any:  no  SAFETY ISSUES:  Prior radiation? no  Pacemaker/ICD? no  Possible current pregnancy? no  Is the patient on methotrexate? no  Current Complaints / other details:  Widow Menarche age 11, G45, P25, first live birth age 62, menopause age 20, no HRT Younger sister w/history breast cancer, unknown therapy, sister lives in [place]. Pt has remote history right breast biopsy, benign, early 1980's.    Andria Rhein, RN 01/06/2014,8:19 AM

## 2014-01-06 NOTE — Telephone Encounter (Signed)
Patient's reordered pair of shoes have been here since August 03, 2013. She was notified by mail that they were here. Patient needs appointment to pick these up. Referred to schedulers.

## 2014-01-08 ENCOUNTER — Encounter: Payer: Self-pay | Admitting: Oncology

## 2014-01-10 ENCOUNTER — Encounter: Payer: Self-pay | Admitting: Radiation Oncology

## 2014-01-10 ENCOUNTER — Telehealth (INDEPENDENT_AMBULATORY_CARE_PROVIDER_SITE_OTHER): Payer: Self-pay

## 2014-01-10 ENCOUNTER — Ambulatory Visit
Admission: RE | Admit: 2014-01-10 | Discharge: 2014-01-10 | Disposition: A | Discharge status: Home / Self Care | Payer: Medicare Other | Source: Ambulatory Visit | Attending: Radiation Oncology | Admitting: Radiation Oncology

## 2014-01-10 VITALS — BP 143/78 | HR 67 | Temp 97.7°F | Resp 20 | Wt 266.8 lb

## 2014-01-10 DIAGNOSIS — D0510 Intraductal carcinoma in situ of unspecified breast: Secondary | ICD-10-CM

## 2014-01-10 DIAGNOSIS — D0590 Unspecified type of carcinoma in situ of unspecified breast: Secondary | ICD-10-CM | POA: Insufficient documentation

## 2014-01-10 DIAGNOSIS — C50311 Malignant neoplasm of lower-inner quadrant of right female breast: Secondary | ICD-10-CM

## 2014-01-10 DIAGNOSIS — Z853 Personal history of malignant neoplasm of breast: Secondary | ICD-10-CM | POA: Insufficient documentation

## 2014-01-10 DIAGNOSIS — Z79899 Other long term (current) drug therapy: Secondary | ICD-10-CM | POA: Insufficient documentation

## 2014-01-10 DIAGNOSIS — Z862 Personal history of diseases of the blood and blood-forming organs and certain disorders involving the immune mechanism: Secondary | ICD-10-CM | POA: Insufficient documentation

## 2014-01-10 DIAGNOSIS — Z8639 Personal history of other endocrine, nutritional and metabolic disease: Secondary | ICD-10-CM | POA: Insufficient documentation

## 2014-01-10 DIAGNOSIS — I10 Essential (primary) hypertension: Secondary | ICD-10-CM | POA: Insufficient documentation

## 2014-01-10 DIAGNOSIS — E119 Type 2 diabetes mellitus without complications: Secondary | ICD-10-CM | POA: Insufficient documentation

## 2014-01-10 HISTORY — DX: Malignant neoplasm of unspecified site of unspecified female breast: C50.919

## 2014-01-10 NOTE — Progress Notes (Signed)
[facility] NEW PATIENT EVALUATION  Name: Karla Willis MRN: [id number]  Date:   01/10/2014           DOB: Jun 25, 1935  Status: outpatient   CC: Elyn Peers, MD  Hoxworth, Darene Lamer, MD , Dr. Marcy Panning   REFERRING PHYSICIAN: Excell Seltzer Darene Lamer, MD   DIAGNOSIS: Stage 0 (Tis N0 M0) low-grade DCIS of the right breast   HISTORY OF PRESENT ILLNESS:  Karla Willis is a 79 y.o. female who is seen today through the courtesy of Dr. Excell Seltzer for evaluation of her low-grade DCIS of the right breast. At the time of a screening mammogram at the [facility] on 10/19/2013 she is found to have new calcifications at the 3:00 position within the right breast. Additional views showed microcalcifications in the right anterior upper inner quadrant which were biopsied on 10/24/2013 and found to be diagnostic for DCIS. This was strongly ER positive at 100% and PR positive at 60%. She apparently had an infection post biopsy and had a right partial mastectomy on 12/16/2013. Within a 7 x 5.5 x 1.8 cm specimen she was noted to have DCIS with calcifications, low-grade, spanning 1.6 cm. DCIS extended focally 0.1 cm to the lateral margin of specimen #1 and an additional excision showed DCIS, low grade with scattered foci with the surgical resection margin negative for DCIS. Therefore, the closest surgical margin was 0.1 cm to the lateral margin of specimen #1. She was seen by Dr. Humphrey Rolls who offered her adjuvant tamoxifen.  PREVIOUS RADIATION THERAPY: No   PAST MEDICAL HISTORY:  has a past medical history of Allergy; Cancer; Diabetes mellitus without complication; Thyroid disease; Hypertension; Wears glasses; HOH (hard of hearing); Arthritis; Gout; and Breast cancer (10/24/13).     PAST SURGICAL HISTORY:  Past Surgical History  Procedure Laterality Date  . Gallbladder sugery    . Thyroid surgery  1970  . Cholecystectomy  1970  . Tubal ligation    . Finger surgery       right hand  . Shoulder arthroscopy  2011    left  . Colonoscopy    . Breast lumpectomy with needle localization Right 12/16/2013    Procedure: BREAST LUMPECTOMY WITH NEEDLE LOCALIZATION;  Surgeon: Edward Jolly, MD;  Location: [facility];  Service: General;  Laterality: Right;  . Breast biopsy Right     remote, benign     FAMILY HISTORY: family history includes Cancer in her sister; Seizures in her sister. Younger sister was diagnosed with breast cancer in her 32s. Her mother died at age 46, questionably related to sickle cell crisis. Her father died at 75, unknown etiology.   SOCIAL HISTORY:  reports that she has never smoked. She does not have any smokeless tobacco history on file. She reports that she does not drink alcohol or use illicit drugs. Widowed for the past 16 months, 3 of 4 children living. She worked at a variety of jobs.   ALLERGIES: Hydrocodone and Codeine   MEDICATIONS:  Current Outpatient Prescriptions  Medication Sig Dispense Refill  . amLODipine (NORVASC) 5 MG tablet Take 5 mg by mouth daily.      Marland Kitchen levothyroxine (SYNTHROID, LEVOTHROID) 100 MCG tablet Take 100 mcg by mouth daily before breakfast.      . lisinopril-hydrochlorothiazide (PRINZIDE,ZESTORETIC) 10-12.5 MG per tablet Take 1 tablet by mouth daily.      . pioglitazone (ACTOS) 30 MG tablet Take 30 mg by mouth daily.      Marland Kitchen  tamoxifen (NOLVADEX) 20 MG tablet Take 1 tablet (20 mg total) by mouth daily.  90 tablet  12   No current facility-administered medications for this encounter.     REVIEW OF SYSTEMS:  Pertinent items are noted in HPI.    PHYSICAL EXAM:  weight is 266 lb 12.8 oz (121.02 kg). Her oral temperature is 97.7 F (36.5 C). Her blood pressure is 143/78 and her pulse is 67. Her respiration is 20.   Alert and oriented 79 year old African American female appearing her stated age. Head and neck examination: Grossly unremarkable. Nodes: Without palpable cervical,  supraclavicular, or axillary lymphadenopathy. Chest: There is a 4-5 cm lipoma just below the suprasternal notch. Lungs are clear. Breasts: Breasts are large. There is a partial mastectomy wound along the upper inner quadrant of the right breast at 2:00. The wound is well-healed. No dominant masses are appreciated. Left breast without masses or lesions. Abdomen obese, without hepatomegaly. Extremities: There is no left upper extremity lymphedema. She does have 1-2+ ankle edema.   LABORATORY DATA:  Lab Results  Component Value Date   WBC 5.2 01/03/2014   HGB 11.5* 01/03/2014   HCT 34.9 01/03/2014   MCV 88.7 01/03/2014   PLT 192 01/03/2014   Lab Results  Component Value Date   NA 143 01/03/2014   K 4.0 01/03/2014   CL 106 12/12/2013   CO2 25 01/03/2014   Lab Results  Component Value Date   ALT 11 01/03/2014   AST 15 01/03/2014   ALKPHOS 58 01/03/2014   BILITOT 0.27 01/03/2014      IMPRESSION: Stage 0 (Tis N0 M0) low-grade DCIS of the right breast. I explained to the patient that her prognosis is excellent. The question at hand is how best to prevent a local recurrence. The risk for local recurrence is related to DCIS size, margins, nuclear grade, and age. All are favorable except for her close surgical margin. Although she technically meets criteria for having adequate surgery, conventional radiation therapy would be difficult and poorly tolerated secondary to skin toxicity considering her breast size. If she undergoes reexcision with at least 1 cm margins, radiation therapy would be unnecessary. I recommended that Dr. Excell Seltzer perform a wide reexcision so we can avoid radiation therapy. She would still be a candidate for adjuvant tamoxifen as a chemopreventive strategy.   PLAN: As discussed above. I will communicate my thoughts to Dr. Excell Seltzer.  I spent 40 minutes minutes face to face with the patient and more than 50% of that time was spent in counseling and/or coordination of care.

## 2014-01-10 NOTE — Telephone Encounter (Signed)
Called and left message for patient to call our office.  Patient has follow up appointment schedule on 01/17/14 w/Dr. Excell Seltzer.

## 2014-01-10 NOTE — Progress Notes (Signed)
Please see the Nurse Progress Note in the MD Initial Consult Encounter for this patient. 

## 2014-01-10 NOTE — Addendum Note (Signed)
Encounter addended by: Andria Rhein, RN on: 01/10/2014 10:42 AM<BR>     Documentation filed: Charges VN

## 2014-01-17 ENCOUNTER — Ambulatory Visit (INDEPENDENT_AMBULATORY_CARE_PROVIDER_SITE_OTHER): Payer: Medicare Other | Admitting: General Surgery

## 2014-01-17 ENCOUNTER — Encounter (INDEPENDENT_AMBULATORY_CARE_PROVIDER_SITE_OTHER): Payer: Self-pay | Admitting: General Surgery

## 2014-01-17 VITALS — BP 140/76 | HR 68 | Temp 98.4°F | Ht 63.0 in | Wt 264.0 lb

## 2014-01-17 DIAGNOSIS — D0510 Intraductal carcinoma in situ of unspecified breast: Secondary | ICD-10-CM

## 2014-01-17 DIAGNOSIS — D0590 Unspecified type of carcinoma in situ of unspecified breast: Secondary | ICD-10-CM

## 2014-01-17 NOTE — Progress Notes (Signed)
Chief complaint: Followup DCIS right breast  History: Patient returns to the office following right breast lumpectomy for ductal carcinoma in situ. She reports no problems from the surgery. No pain or other complaints.  Exam: BP 140/76  Pulse 68  Temp(Src) 98.4 F (36.9 C) (Oral)  Ht 5\' 3"  (1.6 m)  Wt 264 lb (119.75 kg)  BMI 46.78 kg/m2 General: Appears well Breasts: Right breast medial transverse incision is for a well-healed without complicating factors  Pathology report is as follows:  Diagnosis 1. Breast, lumpectomy, Right - DUCTAL CARCINOMA IN SITU WITH CALCIFICATIONS, LOW GRADE, SPANNING 1.6 CM. - DUCTAL CARCINOMA IS FOCALLY 0.1 CM TO THE LATERAL MARGIN OF SPECIMEN # 1. - SEE ONCOLOGY TABLE BELOW. 2. Breast, excision, Right posterior margin - DUCTAL CARCINOMA IN SITU, LOW GRADE, SCATTERED FOCI. - THE SURGICAL RESECTION MARGINS ARE NEGATIVE FOR DUCTAL CARCINOMA.    Assessment and plan:  The patient has seen Dr. Valere Dross for consideration for postoperative radiation. However due to her very large breast size he feels that the risk of toxicity is significant in that the patient would be better served with a wider lumpectomy to obtain widely negative margins and then followup with antiestrogen therapy. I discussed the results of the pathology report and recommendations and alternatives with the patient. I believe she would be a good candidate for radiation therapy the we will have acceptable margins, however since she is at high risk for toxicity due to breast size I think that reexcision to obtain widely negative margins and then followup with antiestrogen therapy is a very reasonable plan. I discussed all this with the patient and her daughter and they understand and are in agreement. We will therefore plan to proceed with reexcision of her right breast lumpectomy. We again discussed the nature of surgery and recovery and risks of anesthetic complications, bleeding and infection.

## 2014-01-20 ENCOUNTER — Ambulatory Visit: Payer: Self-pay | Admitting: Podiatrist

## 2014-01-25 ENCOUNTER — Encounter (HOSPITAL_BASED_OUTPATIENT_CLINIC_OR_DEPARTMENT_OTHER): Payer: Self-pay | Admitting: *Deleted

## 2014-01-25 NOTE — Progress Notes (Signed)
Was here 2/15 for lumpectomy-for re-exc

## 2014-01-30 ENCOUNTER — Ambulatory Visit (HOSPITAL_BASED_OUTPATIENT_CLINIC_OR_DEPARTMENT_OTHER)
Admission: RE | Admit: 2014-01-30 | Discharge: 2014-01-30 | Disposition: A | Discharge status: Home / Self Care | Payer: Medicare Other | Source: Ambulatory Visit | Attending: General Surgery | Admitting: General Surgery

## 2014-01-30 ENCOUNTER — Encounter (HOSPITAL_BASED_OUTPATIENT_CLINIC_OR_DEPARTMENT_OTHER): Payer: Medicare Other | Admitting: Certified Registered"

## 2014-01-30 ENCOUNTER — Ambulatory Visit (HOSPITAL_BASED_OUTPATIENT_CLINIC_OR_DEPARTMENT_OTHER): Payer: Medicare Other | Admitting: Certified Registered"

## 2014-01-30 ENCOUNTER — Encounter (HOSPITAL_BASED_OUTPATIENT_CLINIC_OR_DEPARTMENT_OTHER)
Admission: RE | Disposition: A | Discharge status: Home / Self Care | Payer: Self-pay | Source: Ambulatory Visit | Attending: General Surgery

## 2014-01-30 ENCOUNTER — Encounter (HOSPITAL_BASED_OUTPATIENT_CLINIC_OR_DEPARTMENT_OTHER): Payer: Self-pay | Admitting: *Deleted

## 2014-01-30 DIAGNOSIS — E119 Type 2 diabetes mellitus without complications: Secondary | ICD-10-CM | POA: Insufficient documentation

## 2014-01-30 DIAGNOSIS — I10 Essential (primary) hypertension: Secondary | ICD-10-CM | POA: Insufficient documentation

## 2014-01-30 DIAGNOSIS — D0510 Intraductal carcinoma in situ of unspecified breast: Secondary | ICD-10-CM

## 2014-01-30 DIAGNOSIS — D0590 Unspecified type of carcinoma in situ of unspecified breast: Secondary | ICD-10-CM

## 2014-01-30 DIAGNOSIS — Z853 Personal history of malignant neoplasm of breast: Secondary | ICD-10-CM

## 2014-01-30 HISTORY — PX: RE-EXCISION OF BREAST LUMPECTOMY: SHX6048

## 2014-01-30 LAB — POCT HEMOGLOBIN-HEMACUE: Hemoglobin: 12.1 g/dL (ref 12.0–15.0)

## 2014-01-30 LAB — GLUCOSE, CAPILLARY
Glucose-Capillary: 71 mg/dL (ref 70–99)
Glucose-Capillary: 105 mg/dL — ABNORMAL HIGH (ref 70–99)

## 2014-01-30 SURGERY — EXCISION, LESION, BREAST
Anesthesia: General | Site: Breast | Laterality: Right

## 2014-01-30 MED ORDER — LIDOCAINE HCL (CARDIAC) 20 MG/ML IV SOLN
INTRAVENOUS | Status: DC | PRN
  Administered 2014-01-30: 60 mg via INTRAVENOUS

## 2014-01-30 MED ORDER — LACTATED RINGERS IV SOLN
INTRAVENOUS | Status: DC
  Administered 2014-01-30: 12:00:00 via INTRAVENOUS

## 2014-01-30 MED ORDER — EPHEDRINE SULFATE 50 MG/ML IJ SOLN
INTRAMUSCULAR | Status: DC | PRN
  Administered 2014-01-30 (×2): 10 mg via INTRAVENOUS

## 2014-01-30 MED ORDER — CEFAZOLIN SODIUM-DEXTROSE 2-3 GM-% IV SOLR
INTRAVENOUS | Status: AC
  Filled 2014-01-30: qty 50

## 2014-01-30 MED ORDER — BUPIVACAINE-EPINEPHRINE PF 0.5-1:200000 % IJ SOLN
INTRAMUSCULAR | Status: AC
Start: 2014-01-30 — End: 2014-01-30
  Filled 2014-01-30: qty 30

## 2014-01-30 MED ORDER — FENTANYL CITRATE 0.05 MG/ML IJ SOLN: 50.0000 ug | INTRAMUSCULAR | Status: DC | PRN

## 2014-01-30 MED ORDER — FENTANYL CITRATE 0.05 MG/ML IJ SOLN
INTRAMUSCULAR | Status: DC | PRN
  Administered 2014-01-30: 50 ug via INTRAVENOUS
  Administered 2014-01-30 (×2): 25 ug via INTRAVENOUS

## 2014-01-30 MED ORDER — HEPARIN SODIUM (PORCINE) 5000 UNIT/ML IJ SOLN
INTRAMUSCULAR | Status: AC
  Filled 2014-01-30: qty 1

## 2014-01-30 MED ORDER — FENTANYL CITRATE 0.05 MG/ML IJ SOLN
INTRAMUSCULAR | Status: AC
Start: 2014-01-30 — End: 2014-01-30
  Filled 2014-01-30: qty 6

## 2014-01-30 MED ORDER — BUPIVACAINE HCL (PF) 0.25 % IJ SOLN
INTRAMUSCULAR | Status: AC
  Filled 2014-01-30: qty 30

## 2014-01-30 MED ORDER — DEXAMETHASONE SODIUM PHOSPHATE 4 MG/ML IJ SOLN
INTRAMUSCULAR | Status: DC | PRN
  Administered 2014-01-30: 4 mg via INTRAVENOUS

## 2014-01-30 MED ORDER — HEPARIN SODIUM (PORCINE) 5000 UNIT/ML IJ SOLN
5000.0000 [IU] | Freq: Once | INTRAMUSCULAR | Status: AC
Start: 2014-01-30 — End: 2014-01-30
  Administered 2014-01-30: 5000 [IU] via SUBCUTANEOUS

## 2014-01-30 MED ORDER — BUPIVACAINE-EPINEPHRINE 0.5% -1:200000 IJ SOLN
INTRAMUSCULAR | Status: DC | PRN
  Administered 2014-01-30: 20 mL

## 2014-01-30 MED ORDER — ONDANSETRON HCL 4 MG/2ML IJ SOLN
INTRAMUSCULAR | Status: DC | PRN
  Administered 2014-01-30: 4 mg via INTRAVENOUS

## 2014-01-30 MED ORDER — TRAMADOL HCL 50 MG PO TABS: 50.0000 mg | ORAL_TABLET | Freq: Four times a day (QID) | ORAL | Status: DC | PRN

## 2014-01-30 MED ORDER — TRAMADOL HCL 50 MG PO TABS
ORAL_TABLET | ORAL | Status: AC
  Filled 2014-01-30: qty 1

## 2014-01-30 MED ORDER — HYDROMORPHONE HCL PF 1 MG/ML IJ SOLN
INTRAMUSCULAR | Status: AC
  Filled 2014-01-30: qty 1

## 2014-01-30 MED ORDER — HYDROMORPHONE HCL PF 1 MG/ML IJ SOLN
0.2500 mg | INTRAMUSCULAR | Status: DC | PRN
  Administered 2014-01-30: 0.5 mg via INTRAVENOUS

## 2014-01-30 MED ORDER — CEFAZOLIN SODIUM-DEXTROSE 2-3 GM-% IV SOLR
2.0000 g | INTRAVENOUS | Status: AC
  Administered 2014-01-30: 2 g via INTRAVENOUS

## 2014-01-30 MED ORDER — CHLORHEXIDINE GLUCONATE 4 % EX LIQD: 1.0000 "application " | Freq: Once | CUTANEOUS | Status: DC

## 2014-01-30 MED ORDER — PROPOFOL 10 MG/ML IV BOLUS
INTRAVENOUS | Status: DC | PRN
  Administered 2014-01-30: 200 mg via INTRAVENOUS

## 2014-01-30 MED ORDER — MIDAZOLAM HCL 2 MG/2ML IJ SOLN: 1.0000 mg | INTRAMUSCULAR | Status: DC | PRN

## 2014-01-30 MED ORDER — TRAMADOL HCL 50 MG PO TABS
50.0000 mg | ORAL_TABLET | Freq: Once | ORAL | Status: AC
  Administered 2014-01-30: 50 mg via ORAL

## 2014-01-30 SURGICAL SUPPLY — 51 items
ADH SKN CLS APL DERMABOND .7 (GAUZE/BANDAGES/DRESSINGS) ×1
BLADE SURG 10 STRL SS (BLADE) ×1 IMPLANT
BLADE SURG 15 STRL LF DISP TIS (BLADE) ×1 IMPLANT
BLADE SURG 15 STRL SS (BLADE) ×2
CANISTER SUCT 1200ML W/VALVE (MISCELLANEOUS) ×2 IMPLANT
CHLORAPREP W/TINT 26ML (MISCELLANEOUS) ×2 IMPLANT
CLIP TI MEDIUM 6 (CLIP) IMPLANT
CLIP TI WIDE RED SMALL 6 (CLIP) ×1 IMPLANT
COVER MAYO STAND STRL (DRAPES) ×2 IMPLANT
COVER TABLE BACK 60X90 (DRAPES) ×2 IMPLANT
DERMABOND ADVANCED (GAUZE/BANDAGES/DRESSINGS) ×1
DERMABOND ADVANCED .7 DNX12 (GAUZE/BANDAGES/DRESSINGS) IMPLANT
DEVICE DUBIN W/COMP PLATE 8390 (MISCELLANEOUS) IMPLANT
DRAPE PED LAPAROTOMY (DRAPES) ×2 IMPLANT
DRAPE UTILITY XL STRL (DRAPES) ×2 IMPLANT
ELECT COATED BLADE 2.86 ST (ELECTRODE) ×2 IMPLANT
ELECT REM PT RETURN 9FT ADLT (ELECTROSURGICAL) ×2
ELECTRODE REM PT RTRN 9FT ADLT (ELECTROSURGICAL) ×1 IMPLANT
GLOVE BIOGEL M 7.0 STRL (GLOVE) ×1 IMPLANT
GLOVE BIOGEL PI IND STRL 7.5 (GLOVE) IMPLANT
GLOVE BIOGEL PI IND STRL 8 (GLOVE) ×1 IMPLANT
GLOVE BIOGEL PI INDICATOR 7.5 (GLOVE) ×1
GLOVE BIOGEL PI INDICATOR 8 (GLOVE) ×1
GLOVE EXAM NITRILE EXT CUFF MD (GLOVE) ×1 IMPLANT
GLOVE SS BIOGEL STRL SZ 7.5 (GLOVE) ×1 IMPLANT
GLOVE SUPERSENSE BIOGEL SZ 7.5 (GLOVE) ×1
GOWN STRL REUS W/ TWL LRG LVL3 (GOWN DISPOSABLE) ×1 IMPLANT
GOWN STRL REUS W/ TWL XL LVL3 (GOWN DISPOSABLE) ×1 IMPLANT
GOWN STRL REUS W/TWL LRG LVL3 (GOWN DISPOSABLE) ×2
GOWN STRL REUS W/TWL XL LVL3 (GOWN DISPOSABLE) ×2
KIT MARKER MARGIN INK (KITS) IMPLANT
NDL HYPO 25X1 1.5 SAFETY (NEEDLE) ×1 IMPLANT
NEEDLE HYPO 25X1 1.5 SAFETY (NEEDLE) ×2 IMPLANT
NS IRRIG 1000ML POUR BTL (IV SOLUTION) ×2 IMPLANT
PACK BASIN DAY SURGERY FS (CUSTOM PROCEDURE TRAY) ×2 IMPLANT
PENCIL BUTTON HOLSTER BLD 10FT (ELECTRODE) ×2 IMPLANT
SLEEVE SCD COMPRESS KNEE MED (MISCELLANEOUS) ×1 IMPLANT
STAPLER VISISTAT 35W (STAPLE) IMPLANT
SUT MNCRL AB 4-0 PS2 18 (SUTURE) ×1 IMPLANT
SUT MON AB 5-0 PS2 18 (SUTURE) IMPLANT
SUT SILK 3 0 SH 30 (SUTURE) ×1 IMPLANT
SUT VIC AB 3-0 SH 27 (SUTURE)
SUT VIC AB 3-0 SH 27X BRD (SUTURE) IMPLANT
SUT VIC AB 4-0 BRD 54 (SUTURE) IMPLANT
SUT VICRYL 3-0 CR8 SH (SUTURE) ×1 IMPLANT
SYR BULB 3OZ (MISCELLANEOUS) IMPLANT
SYR CONTROL 10ML LL (SYRINGE) ×2 IMPLANT
TOWEL OR 17X24 6PK STRL BLUE (TOWEL DISPOSABLE) ×2 IMPLANT
TOWEL OR NON WOVEN STRL DISP B (DISPOSABLE) ×2 IMPLANT
TUBE CONNECTING 20X1/4 (TUBING) ×2 IMPLANT
YANKAUER SUCT BULB TIP NO VENT (SUCTIONS) ×2 IMPLANT

## 2014-01-30 NOTE — Anesthesia Postprocedure Evaluation (Signed)
  Anesthesia Post-op Note  Patient: Karla Willis  Procedure(s) Performed: Procedure(s): RE-EXCISION OF BREAST LUMPECTOMY (Right)  Patient Location: PACU  Anesthesia Type:General  Level of Consciousness: awake and alert   Airway and Oxygen Therapy: Patient Spontanous Breathing  Post-op Pain: mild  Post-op Assessment: Post-op Vital signs reviewed, Patient's Cardiovascular Status Stable and Respiratory Function Stable  Post-op Vital Signs: Reviewed  Filed Vitals:   01/30/14 1415  BP:   Pulse: 77  Temp:   Resp: 12    Complications: No apparent anesthesia complications

## 2014-01-30 NOTE — Anesthesia Procedure Notes (Signed)
Procedure Name: LMA Insertion Date/Time: 01/30/2014 12:31 PM Performed by: Daryl Beehler Pre-anesthesia Checklist: Patient identified, Emergency Drugs available, Suction available and Patient being monitored Patient Re-evaluated:Patient Re-evaluated prior to inductionOxygen Delivery Method: Circle System Utilized Preoxygenation: Pre-oxygenation with 100% oxygen Intubation Type: IV induction Ventilation: Mask ventilation without difficulty LMA: LMA inserted LMA Size: 4.0 Number of attempts: 1 Airway Equipment and Method: bite block Placement Confirmation: positive ETCO2 Tube secured with: Tape Dental Injury: Teeth and Oropharynx as per pre-operative assessment

## 2014-01-30 NOTE — Transfer of Care (Signed)
Immediate Anesthesia Transfer of Care Note  Patient: Karla Willis  Procedure(s) Performed: Procedure(s): RE-EXCISION OF BREAST LUMPECTOMY (Right)  Patient Location: PACU  Anesthesia Type:General  Level of Consciousness: awake, alert , oriented and patient cooperative  Airway & Oxygen Therapy: Patient Spontanous Breathing and Patient connected to face mask oxygen  Post-op Assessment: Report given to PACU RN and Post -op Vital signs reviewed and stable  Post vital signs: Reviewed and stable  Complications: No apparent anesthesia complications

## 2014-01-30 NOTE — Discharge Instructions (Signed)
[facility] °Office Phone Number [phone] ° °BREAST BIOPSY/ LUMPECTOMY: POST OP INSTRUCTIONS ° °Always review your discharge instruction sheet given to you by the facility where your surgery was performed. ° °IF YOU HAVE DISABILITY OR FAMILY LEAVE FORMS, YOU MUST BRING THEM TO THE OFFICE FOR PROCESSING.  DO NOT GIVE THEM TO YOUR DOCTOR. ° °1. A prescription for pain medication may be given to you upon discharge.  Take your pain medication as prescribed, if needed.  If narcotic pain medicine is not needed, then you may take acetaminophen (Tylenol) or ibuprofen (Advil) as needed. °2. Take your usually prescribed medications unless otherwise directed °3. If you need a refill on your pain medication, please contact your pharmacy.  They will contact our office to request authorization.  Prescriptions will not be filled after 5pm or on week-ends. °4. You should eat very light the first 24 hours after surgery, such as soup, crackers, pudding, etc.  Resume your normal diet the day after surgery. °5. Most patients will experience some swelling and bruising in the breast.  Ice packs and a good support bra will help.  Swelling and bruising can take several days to resolve.  °6. It is common to experience some constipation if taking pain medication after surgery.  Increasing fluid intake and taking a stool softener will usually help or prevent this problem from occurring.  A mild laxative (Milk of Magnesia or Miralax) should be taken according to package directions if there are no bowel movements after 48 hours. °7. Unless discharge instructions indicate otherwise, you may remove your bandages 24-48 hours after surgery, and you may shower at that time.  You may have steri-strips (small skin tapes) in place directly over the incision.  These strips should be left on the skin for 7-10 days.  If your surgeon used skin glue on the incision, you may shower in 24 hours.  The glue will flake off over the next 2-3  weeks.  Any sutures or staples will be removed at the office during your follow-up visit. °8. ACTIVITIES:  You may resume regular daily activities (gradually increasing) beginning the next day.  Wearing a good support bra or sports bra minimizes pain and swelling.  You may have sexual intercourse when it is comfortable. °a. You may drive when you no longer are taking prescription pain medication, you can comfortably wear a seatbelt, and you can safely maneuver your car and apply brakes. °b. RETURN TO WORK:  ______________________________________________________________________________________ °9. You should see your doctor in the office for a follow-up appointment approximately two weeks after your surgery.  Your doctor’s nurse will typically make your follow-up appointment when she calls you with your pathology report.  Expect your pathology report 2-3 business days after your surgery.  You may call to check if you do not hear from us after three days. °10. OTHER INSTRUCTIONS: _______________________________________________________________________________________________ _____________________________________________________________________________________________________________________________________ °_____________________________________________________________________________________________________________________________________ °_____________________________________________________________________________________________________________________________________ ° °WHEN TO CALL YOUR DOCTOR: °1. Fever over 101.0 °2. Nausea and/or vomiting. °3. Extreme swelling or bruising. °4. Continued bleeding from incision. °5. Increased pain, redness, or drainage from the incision. ° °The clinic staff is available to answer your questions during regular business hours.  Please don’t hesitate to call and ask to speak to one of the nurses for clinical concerns.  If you have a medical emergency, go to the nearest emergency  room or call 911.  A surgeon from [facility] is always on call at the hospital. ° °For further questions, please visit [email or web address]  ° °  Post Anesthesia Home Care Instructions ° °Activity: °Get plenty of rest for the remainder of the day. A responsible adult should stay with you for 24 hours following the procedure.  °For the next 24 hours, DO NOT: °-Drive a car °-Operate machinery °-Drink alcoholic beverages °-Take any medication unless instructed by your physician °-Make any legal decisions or sign important papers. ° °Meals: °Start with liquid foods such as gelatin or soup. Progress to regular foods as tolerated. Avoid greasy, spicy, heavy foods. If nausea and/or vomiting occur, drink only clear liquids until the nausea and/or vomiting subsides. Call your physician if vomiting continues. ° °Special Instructions/Symptoms: °Your throat may feel dry or sore from the anesthesia or the breathing tube placed in your throat during surgery. If this causes discomfort, gargle with warm salt water. The discomfort should disappear within 24 hours. ° °

## 2014-01-30 NOTE — Op Note (Signed)
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 Due to patient body habitus she is felt to be a difficult and some what high risk patient for radiation complications and after consultation with radiation oncology and discussed with the patient will elect to proceed with reexcision of her lumpectomy site to obtain more widely negative margins in an effort to avoid radiation. The indications for the surgery and risks have been discussed with the patient and detailed elsewhere and she is in agreement.    Procedure Detail:  Patient was brought to the operating room, placed in the supine position on the operating table, and laryngeal mask general anesthesia induced. The entire right breast was widely sterilely prepped and draped. Patient timeout was performed and verified. The previous transverse medial lumpectomy incision which was well healed was completely excised and dissection carried down into the subcutaneous tissue. Feeling the induration of the lumpectomy site I then completely excised the entire lumpectomy site back to normal-appearing tissue in all directions with cautery. The specimen was oriented with sutures. Hemostasis was obtained with cautery. The deep breast and the subcutaneous tissue were closed with interrupted 3-0 Vicryl. The skin was closed with subcuticular 5-0 Monocryl and Dermabond. Sponge needle and instrument counts were correct.    Findings: As above  Estimated Blood Loss:  Minimal         Drains: none  Blood Given: none          Specimens:  Right breast tissue, reexcision right breast  lumpectomy        Complications:  * No complications entered in OR log *         Disposition: PACU - hemodynamically stable.         Condition: stable

## 2014-01-30 NOTE — Interval H&P Note (Signed)
History and Physical Interval Note:  01/30/2014 12:24 PM  Karla Willis  has presented today for surgery, with the diagnosis of DCIS RIGHT BREAST  The various methods of treatment have been discussed with the patient and family. After consideration of risks, benefits and other options for treatment, the patient has consented to  Procedure(s): RE-EXCISION OF BREAST LUMPECTOMY (Right) as a surgical intervention .  The patient's history has been reviewed, patient examined, no change in status, stable for surgery.  I have reviewed the patient's chart and labs.  Questions were answered to the patient's satisfaction.     Lindsay Straka T

## 2014-01-30 NOTE — H&P (View-Only) (Signed)
Chief complaint: Followup DCIS right breast  History: Patient returns to the office following right breast lumpectomy for ductal carcinoma in situ. She reports no problems from the surgery. No pain or other complaints.  Exam: BP 140/76  Pulse 68  Temp(Src) 98.4 F (36.9 C) (Oral)  Ht 5\' 3"  (1.6 m)  Wt 264 lb (119.75 kg)  BMI 46.78 kg/m2 General: Appears well Breasts: Right breast medial transverse incision is for a well-healed without complicating factors  Pathology report is as follows:  Diagnosis 1. Breast, lumpectomy, Right - DUCTAL CARCINOMA IN SITU WITH CALCIFICATIONS, LOW GRADE, SPANNING 1.6 CM. - DUCTAL CARCINOMA IS FOCALLY 0.1 CM TO THE LATERAL MARGIN OF SPECIMEN # 1. - SEE ONCOLOGY TABLE BELOW. 2. Breast, excision, Right posterior margin - DUCTAL CARCINOMA IN SITU, LOW GRADE, SCATTERED FOCI. - THE SURGICAL RESECTION MARGINS ARE NEGATIVE FOR DUCTAL CARCINOMA.    Assessment and plan:  The patient has seen Dr. Valere Dross for consideration for postoperative radiation. However due to her very large breast size he feels that the risk of toxicity is significant in that the patient would be better served with a wider lumpectomy to obtain widely negative margins and then followup with antiestrogen therapy. I discussed the results of the pathology report and recommendations and alternatives with the patient. I believe she would be a good candidate for radiation therapy the we will have acceptable margins, however since she is at high risk for toxicity due to breast size I think that reexcision to obtain widely negative margins and then followup with antiestrogen therapy is a very reasonable plan. I discussed all this with the patient and her daughter and they understand and are in agreement. We will therefore plan to proceed with reexcision of her right breast lumpectomy. We again discussed the nature of surgery and recovery and risks of anesthetic complications, bleeding and infection.

## 2014-01-30 NOTE — Interval H&P Note (Signed)
History and Physical Interval Note:  01/30/2014 12:17 PM  Karla Willis  has presented today for surgery, with the diagnosis of DCIS RIGHT BREAST  The various methods of treatment have been discussed with the patient and family. After consideration of risks, benefits and other options for treatment, the patient has consented to  Procedure(s): RE-EXCISION OF BREAST LUMPECTOMY (Right) as a surgical intervention .  The patient's history has been reviewed, patient examined, no change in status, stable for surgery.  I have reviewed the patient's chart and labs.  Questions were answered to the patient's satisfaction.     [name]

## 2014-01-30 NOTE — Anesthesia Preprocedure Evaluation (Addendum)
Anesthesia Evaluation  Patient identified by MRN, date of birth, ID band Patient awake    Reviewed: Allergy & Precautions, H&P , NPO status , Patient's Chart, lab work & pertinent test results  Airway Mallampati: III TM Distance: >3 FB Neck ROM: Full    Dental no notable dental hx. (+) Teeth Intact, Dental Advisory Given   Pulmonary neg pulmonary ROS,  breath sounds clear to auscultation  Pulmonary exam normal       Cardiovascular hypertension, On Medications Rhythm:Regular Rate:Normal     Neuro/Psych negative neurological ROS  negative psych ROS   GI/Hepatic negative GI ROS, Neg liver ROS,   Endo/Other  diabetes, Type 2, Oral Hypoglycemic AgentsMorbid obesity  Renal/GU negative Renal ROS  negative genitourinary   Musculoskeletal   Abdominal   Peds  Hematology negative hematology ROS (+)   Anesthesia Other Findings   Reproductive/Obstetrics negative OB ROS                          Anesthesia Physical Anesthesia Plan  ASA: III  Anesthesia Plan: General   Post-op Pain Management:    Induction: Intravenous  Airway Management Planned: LMA  Additional Equipment:   Intra-op Plan:   Post-operative Plan: Extubation in OR  Informed Consent: I have reviewed the patients History and Physical, chart, labs and discussed the procedure including the risks, benefits and alternatives for the proposed anesthesia with the patient or authorized representative who has indicated his/her understanding and acceptance.   Dental advisory given  Plan Discussed with: CRNA  Anesthesia Plan Comments:         Anesthesia Quick Evaluation

## 2014-01-31 ENCOUNTER — Encounter (HOSPITAL_BASED_OUTPATIENT_CLINIC_OR_DEPARTMENT_OTHER): Payer: Self-pay | Admitting: General Surgery

## 2014-02-03 ENCOUNTER — Encounter (INDEPENDENT_AMBULATORY_CARE_PROVIDER_SITE_OTHER): Payer: Self-pay | Admitting: General Surgery

## 2014-02-03 ENCOUNTER — Ambulatory Visit (INDEPENDENT_AMBULATORY_CARE_PROVIDER_SITE_OTHER): Payer: Medicare Other | Admitting: Podiatrist

## 2014-02-03 ENCOUNTER — Encounter: Payer: Self-pay | Admitting: Podiatrist

## 2014-02-03 VITALS — BP 143/67 | HR 67 | Resp 12

## 2014-02-03 DIAGNOSIS — M659 Synovitis and tenosynovitis, unspecified: Secondary | ICD-10-CM

## 2014-02-03 DIAGNOSIS — E1159 Type 2 diabetes mellitus with other circulatory complications: Secondary | ICD-10-CM

## 2014-02-03 DIAGNOSIS — E119 Type 2 diabetes mellitus without complications: Secondary | ICD-10-CM

## 2014-02-03 DIAGNOSIS — M7750 Other enthesopathy of unspecified foot: Secondary | ICD-10-CM

## 2014-02-03 NOTE — Progress Notes (Signed)
Karla Willis presents today to pick up her diabetic shoes only. She relates they are comfortable. She did have to return her last pair as they did not fit properly. These are her replacement. She'll be seen back as needed for followup. Shoes are noted to fit and contour her foot well they are long enough. There are [name] type of shoe which appears to work well for her edema.

## 2014-02-08 ENCOUNTER — Other Ambulatory Visit (INDEPENDENT_AMBULATORY_CARE_PROVIDER_SITE_OTHER): Payer: Self-pay | Admitting: General Surgery

## 2014-02-08 ENCOUNTER — Telehealth (INDEPENDENT_AMBULATORY_CARE_PROVIDER_SITE_OTHER): Payer: Self-pay | Admitting: General Surgery

## 2014-02-08 NOTE — Telephone Encounter (Signed)
Called pt and discussed path report. We discussed that there was the finding of a 1.2 cm area of DCIS with a close medial margin of 1 mm. This would appear to be a separate focus from her original tumor. We discussed options including reexcised and the medial margin to obtain a negative margin versus mastectomy. I think it is reasonable to re\re excise the area and this is what she prefers. I offered an office appointment to discuss this in person but she is comfortable sitting up procedure by phone him therefore plan to proceed with reexcision of the medial margin.

## 2014-02-10 ENCOUNTER — Telehealth: Payer: Self-pay | Admitting: *Deleted

## 2014-02-10 NOTE — Telephone Encounter (Signed)
Mailed after appt letter to pt. 

## 2014-02-16 ENCOUNTER — Encounter (INDEPENDENT_AMBULATORY_CARE_PROVIDER_SITE_OTHER): Payer: Self-pay | Admitting: General Surgery

## 2014-02-16 ENCOUNTER — Ambulatory Visit (INDEPENDENT_AMBULATORY_CARE_PROVIDER_SITE_OTHER): Payer: Medicare Other | Admitting: General Surgery

## 2014-02-16 VITALS — BP 132/78 | HR 71 | Temp 98.5°F | Resp 16 | Wt 259.8 lb

## 2014-02-16 DIAGNOSIS — D0590 Unspecified type of carcinoma in situ of unspecified breast: Secondary | ICD-10-CM

## 2014-02-16 DIAGNOSIS — D0510 Intraductal carcinoma in situ of unspecified breast: Secondary | ICD-10-CM

## 2014-02-16 NOTE — Progress Notes (Signed)
Chief complaint followup lumpectomy for DCIS  History: Patient returns for followup status post reexcision of right breast lumpectomy for DCIS. She had 2 close margins they were trying to obtain wider margins so she could avoid radiation. However an apparent new focus of DCIS was found on her reexcision with her medial margin being close at less than 1 mm with a new 1.2 cm focus of DCIS found. This margin was actually wider than this at her original lumpectomy. Other margins however are widely clear. I previously discussed with the patient options which included another attempted reexcision to obtain widely negative margins versus total mastectomy. She would like one more attempt at reexcision I think this is reasonable. We discussed this today including the nature of the surgery and indications and possibility that we would still require mastectomy. She understands and agrees.  Exam: BP 132/78  Pulse 71  Temp(Src) 98.5 F (36.9 C) (Temporal)  Resp 16  Wt 259 lb 12.8 oz (117.845 kg) General: Obese, no distress Breasts: No palpable masses. Healing lumpectomy incision medially right breast  Assessment and plan: DCIS right breast with close margin following 2 excisions. Plan reexcision of her medial margin which is scheduled for later this month. 

## 2014-02-28 ENCOUNTER — Encounter (HOSPITAL_BASED_OUTPATIENT_CLINIC_OR_DEPARTMENT_OTHER): Payer: Self-pay | Admitting: *Deleted

## 2014-02-28 NOTE — Progress Notes (Signed)
This is her 3rd rt br surgery here-re-exc-will come get new bmet-had no problems post op

## 2014-03-03 ENCOUNTER — Encounter (HOSPITAL_BASED_OUTPATIENT_CLINIC_OR_DEPARTMENT_OTHER)
Admission: RE | Admit: 2014-03-03 | Discharge: 2014-03-03 | Disposition: A | Discharge status: Home / Self Care | Payer: Medicare Other | Source: Ambulatory Visit | Attending: General Surgery | Admitting: General Surgery

## 2014-03-03 LAB — POCT I-STAT, CHEM 8
BUN: 35 mg/dL — ABNORMAL HIGH (ref 6–23)
Calcium, Ion: 1.17 mmol/L (ref 1.13–1.30)
Chloride: 109 mEq/L (ref 96–112)
Creatinine, Ser: 1.4 mg/dL — ABNORMAL HIGH (ref 0.50–1.10)
Glucose, Bld: 134 mg/dL — ABNORMAL HIGH (ref 70–99)
HCT: 42 % (ref 36.0–46.0)
Hemoglobin: 14.3 g/dL (ref 12.0–15.0)
Potassium: 5.2 mEq/L (ref 3.7–5.3)
Sodium: 141 mEq/L (ref 137–147)
TCO2: 24 mmol/L (ref 0–100)

## 2014-03-06 ENCOUNTER — Encounter (HOSPITAL_BASED_OUTPATIENT_CLINIC_OR_DEPARTMENT_OTHER): Payer: Self-pay

## 2014-03-06 ENCOUNTER — Ambulatory Visit (HOSPITAL_BASED_OUTPATIENT_CLINIC_OR_DEPARTMENT_OTHER): Payer: Medicare Other | Admitting: Anesthesiology

## 2014-03-06 ENCOUNTER — Ambulatory Visit (HOSPITAL_BASED_OUTPATIENT_CLINIC_OR_DEPARTMENT_OTHER)
Admission: RE | Admit: 2014-03-06 | Discharge: 2014-03-06 | Disposition: A | Discharge status: Home / Self Care | Payer: Medicare Other | Source: Ambulatory Visit | Attending: General Surgery | Admitting: General Surgery

## 2014-03-06 ENCOUNTER — Encounter (HOSPITAL_BASED_OUTPATIENT_CLINIC_OR_DEPARTMENT_OTHER): Payer: Medicare Other | Admitting: Anesthesiology

## 2014-03-06 ENCOUNTER — Encounter (HOSPITAL_BASED_OUTPATIENT_CLINIC_OR_DEPARTMENT_OTHER)
Admission: RE | Disposition: A | Discharge status: Home / Self Care | Payer: Self-pay | Source: Ambulatory Visit | Attending: General Surgery

## 2014-03-06 DIAGNOSIS — D0590 Unspecified type of carcinoma in situ of unspecified breast: Secondary | ICD-10-CM | POA: Insufficient documentation

## 2014-03-06 DIAGNOSIS — Z01812 Encounter for preprocedural laboratory examination: Secondary | ICD-10-CM | POA: Insufficient documentation

## 2014-03-06 DIAGNOSIS — I10 Essential (primary) hypertension: Secondary | ICD-10-CM | POA: Insufficient documentation

## 2014-03-06 DIAGNOSIS — D0510 Intraductal carcinoma in situ of unspecified breast: Secondary | ICD-10-CM

## 2014-03-06 DIAGNOSIS — E119 Type 2 diabetes mellitus without complications: Secondary | ICD-10-CM | POA: Insufficient documentation

## 2014-03-06 DIAGNOSIS — N641 Fat necrosis of breast: Secondary | ICD-10-CM | POA: Insufficient documentation

## 2014-03-06 HISTORY — PX: RE-EXCISION OF BREAST LUMPECTOMY: SHX6048

## 2014-03-06 LAB — GLUCOSE, CAPILLARY: Glucose-Capillary: 78 mg/dL (ref 70–99)

## 2014-03-06 SURGERY — EXCISION, LESION, BREAST
Anesthesia: General | Site: Breast | Laterality: Right

## 2014-03-06 MED ORDER — BUPIVACAINE-EPINEPHRINE PF 0.5-1:200000 % IJ SOLN
INTRAMUSCULAR | Status: AC
  Filled 2014-03-06: qty 30

## 2014-03-06 MED ORDER — LACTATED RINGERS IV SOLN
INTRAVENOUS | Status: DC
  Administered 2014-03-06: 12:00:00 via INTRAVENOUS

## 2014-03-06 MED ORDER — HYDROMORPHONE HCL PF 1 MG/ML IJ SOLN
0.2500 mg | INTRAMUSCULAR | Status: DC | PRN
  Administered 2014-03-06: 0.5 mg via INTRAVENOUS

## 2014-03-06 MED ORDER — MIDAZOLAM HCL 2 MG/2ML IJ SOLN: 1.0000 mg | INTRAMUSCULAR | Status: DC | PRN

## 2014-03-06 MED ORDER — FENTANYL CITRATE 0.05 MG/ML IJ SOLN
INTRAMUSCULAR | Status: DC | PRN
  Administered 2014-03-06: 50 ug via INTRAVENOUS

## 2014-03-06 MED ORDER — CEFAZOLIN SODIUM-DEXTROSE 2-3 GM-% IV SOLR
2.0000 g | INTRAVENOUS | Status: AC
  Administered 2014-03-06: 2 g via INTRAVENOUS

## 2014-03-06 MED ORDER — PROMETHAZINE HCL 25 MG/ML IJ SOLN: 6.2500 mg | INTRAMUSCULAR | Status: DC | PRN

## 2014-03-06 MED ORDER — LIDOCAINE HCL (CARDIAC) 20 MG/ML IV SOLN
INTRAVENOUS | Status: DC | PRN
  Administered 2014-03-06: 50 mg via INTRAVENOUS

## 2014-03-06 MED ORDER — BUPIVACAINE HCL (PF) 0.25 % IJ SOLN
INTRAMUSCULAR | Status: AC
  Filled 2014-03-06: qty 30

## 2014-03-06 MED ORDER — MIDAZOLAM HCL 2 MG/2ML IJ SOLN
INTRAMUSCULAR | Status: AC
  Filled 2014-03-06: qty 2

## 2014-03-06 MED ORDER — HYDROMORPHONE HCL PF 1 MG/ML IJ SOLN
INTRAMUSCULAR | Status: AC
  Filled 2014-03-06: qty 1

## 2014-03-06 MED ORDER — TRAMADOL HCL 50 MG PO TABS
50.0000 mg | ORAL_TABLET | Freq: Once | ORAL | Status: AC | PRN
  Administered 2014-03-06: 50 mg via ORAL

## 2014-03-06 MED ORDER — FENTANYL CITRATE 0.05 MG/ML IJ SOLN
INTRAMUSCULAR | Status: AC
  Filled 2014-03-06: qty 4

## 2014-03-06 MED ORDER — ONDANSETRON HCL 4 MG/2ML IJ SOLN
INTRAMUSCULAR | Status: DC | PRN
  Administered 2014-03-06: 4 mg via INTRAVENOUS

## 2014-03-06 MED ORDER — HYDROMORPHONE HCL PF 1 MG/ML IJ SOLN
INTRAMUSCULAR | Status: AC
Start: 2014-03-06 — End: 2014-03-06
  Filled 2014-03-06: qty 1

## 2014-03-06 MED ORDER — TRAMADOL HCL 50 MG PO TABS
ORAL_TABLET | ORAL | Status: AC
  Filled 2014-03-06: qty 1

## 2014-03-06 MED ORDER — CHLORHEXIDINE GLUCONATE 4 % EX LIQD: 1.0000 "application " | Freq: Once | CUTANEOUS | Status: DC

## 2014-03-06 MED ORDER — FENTANYL CITRATE 0.05 MG/ML IJ SOLN: 50.0000 ug | INTRAMUSCULAR | Status: DC | PRN

## 2014-03-06 MED ORDER — PROPOFOL 10 MG/ML IV BOLUS
INTRAVENOUS | Status: DC | PRN
  Administered 2014-03-06: 170 mg via INTRAVENOUS

## 2014-03-06 MED ORDER — BUPIVACAINE-EPINEPHRINE 0.5% -1:200000 IJ SOLN
INTRAMUSCULAR | Status: DC | PRN
  Administered 2014-03-06: 20 mL

## 2014-03-06 MED ORDER — TRAMADOL HCL 50 MG PO TABS: 50.0000 mg | ORAL_TABLET | Freq: Four times a day (QID) | ORAL | Status: DC | PRN

## 2014-03-06 MED ORDER — CEFAZOLIN SODIUM-DEXTROSE 2-3 GM-% IV SOLR
INTRAVENOUS | Status: AC
Start: 2014-03-06 — End: 2014-03-06
  Filled 2014-03-06: qty 50

## 2014-03-06 SURGICAL SUPPLY — 49 items
ADH SKN CLS APL DERMABOND .7 (GAUZE/BANDAGES/DRESSINGS) ×1
BLADE 10 SAFETY STRL DISP (BLADE) IMPLANT
BLADE 15 SAFETY STRL DISP (BLADE) ×2 IMPLANT
CANISTER SUCT 1200ML W/VALVE (MISCELLANEOUS) ×2 IMPLANT
CHLORAPREP W/TINT 26ML (MISCELLANEOUS) ×2 IMPLANT
CLIP TI MEDIUM 6 (CLIP) IMPLANT
CLIP TI WIDE RED SMALL 6 (CLIP) ×1 IMPLANT
COVER MAYO STAND STRL (DRAPES) ×2 IMPLANT
COVER TABLE BACK 60X90 (DRAPES) ×2 IMPLANT
DERMABOND ADVANCED (GAUZE/BANDAGES/DRESSINGS) ×1
DERMABOND ADVANCED .7 DNX12 (GAUZE/BANDAGES/DRESSINGS) IMPLANT
DEVICE DUBIN W/COMP PLATE 8390 (MISCELLANEOUS) IMPLANT
DRAPE PED LAPAROTOMY (DRAPES) ×3 IMPLANT
DRAPE UTILITY XL STRL (DRAPES) ×3 IMPLANT
ELECT COATED BLADE 2.86 ST (ELECTRODE) ×2 IMPLANT
ELECT REM PT RETURN 9FT ADLT (ELECTROSURGICAL) ×2
ELECTRODE REM PT RTRN 9FT ADLT (ELECTROSURGICAL) ×1 IMPLANT
GLOVE BIOGEL PI IND STRL 7.0 (GLOVE) IMPLANT
GLOVE BIOGEL PI IND STRL 8 (GLOVE) ×1 IMPLANT
GLOVE BIOGEL PI INDICATOR 7.0 (GLOVE) ×1
GLOVE BIOGEL PI INDICATOR 8 (GLOVE) ×1
GLOVE ECLIPSE 6.5 STRL STRAW (GLOVE) ×1 IMPLANT
GLOVE SS BIOGEL STRL SZ 7.5 (GLOVE) ×1 IMPLANT
GLOVE SUPERSENSE BIOGEL SZ 7.5 (GLOVE) ×1
GOWN STRL REUS W/ TWL LRG LVL3 (GOWN DISPOSABLE) ×1 IMPLANT
GOWN STRL REUS W/ TWL XL LVL3 (GOWN DISPOSABLE) ×1 IMPLANT
GOWN STRL REUS W/TWL LRG LVL3 (GOWN DISPOSABLE) ×2
GOWN STRL REUS W/TWL XL LVL3 (GOWN DISPOSABLE) ×2
KIT MARKER MARGIN INK (KITS) IMPLANT
NDL HYPO 25X1 1.5 SAFETY (NEEDLE) ×1 IMPLANT
NEEDLE HYPO 25X1 1.5 SAFETY (NEEDLE) ×2 IMPLANT
NS IRRIG 1000ML POUR BTL (IV SOLUTION) ×2 IMPLANT
PACK BASIN DAY SURGERY FS (CUSTOM PROCEDURE TRAY) ×2 IMPLANT
PENCIL BUTTON HOLSTER BLD 10FT (ELECTRODE) ×2 IMPLANT
SLEEVE SCD COMPRESS KNEE MED (MISCELLANEOUS) ×1 IMPLANT
STAPLER VISISTAT 35W (STAPLE) IMPLANT
SUT MNCRL AB 4-0 PS2 18 (SUTURE) IMPLANT
SUT MON AB 5-0 PS2 18 (SUTURE) ×1 IMPLANT
SUT SILK 3 0 SH 30 (SUTURE) IMPLANT
SUT VIC AB 3-0 SH 27 (SUTURE)
SUT VIC AB 3-0 SH 27X BRD (SUTURE) IMPLANT
SUT VIC AB 4-0 BRD 54 (SUTURE) IMPLANT
SUT VICRYL 3-0 CR8 SH (SUTURE) ×1 IMPLANT
SYR BULB 3OZ (MISCELLANEOUS) IMPLANT
SYR CONTROL 10ML LL (SYRINGE) ×2 IMPLANT
TOWEL OR 17X24 6PK STRL BLUE (TOWEL DISPOSABLE) ×3 IMPLANT
TOWEL OR NON WOVEN STRL DISP B (DISPOSABLE) ×1 IMPLANT
TUBE CONNECTING 20X1/4 (TUBING) ×2 IMPLANT
YANKAUER SUCT BULB TIP NO VENT (SUCTIONS) ×2 IMPLANT

## 2014-03-06 NOTE — Transfer of Care (Signed)
Immediate Anesthesia Transfer of Care Note  Patient: Karla Willis  Procedure(s) Performed: Procedure(s): RE-EXCISION OF BREAST LUMPECTOMY (Right)  Patient Location: PACU  Anesthesia Type:General  Level of Consciousness: awake and alert   Airway & Oxygen Therapy: Patient Spontanous Breathing and Patient connected to face mask oxygen  Post-op Assessment: Report given to PACU RN and Post -op Vital signs reviewed and stable  Post vital signs: Reviewed and stable  Complications: No apparent anesthesia complications

## 2014-03-06 NOTE — Anesthesia Preprocedure Evaluation (Signed)
Anesthesia Evaluation  Patient identified by MRN, date of birth, ID band Patient awake    Reviewed: Allergy & Precautions, H&P , NPO status , Patient's Chart, lab work & pertinent test results  History of Anesthesia Complications Negative for: history of anesthetic complications  Airway Mallampati: II TM Distance: >3 FB Neck ROM: Full    Dental  (+) Poor Dentition, Dental Advisory Given   Pulmonary neg pulmonary ROS,    Pulmonary exam normal       Cardiovascular hypertension, Pt. on medications     Neuro/Psych negative neurological ROS  negative psych ROS   GI/Hepatic negative GI ROS, Neg liver ROS,   Endo/Other  diabetes, Oral Hypoglycemic AgentsMorbid obesity  Renal/GU negative Renal ROS     Musculoskeletal   Abdominal   Peds  Hematology   Anesthesia Other Findings   Reproductive/Obstetrics                           Anesthesia Physical Anesthesia Plan  ASA: III  Anesthesia Plan: General   Post-op Pain Management:    Induction: Intravenous  Airway Management Planned: LMA  Additional Equipment:   Intra-op Plan:   Post-operative Plan: Extubation in OR  Informed Consent: I have reviewed the patients History and Physical, chart, labs and discussed the procedure including the risks, benefits and alternatives for the proposed anesthesia with the patient or authorized representative who has indicated his/her understanding and acceptance.   Dental advisory given  Plan Discussed with: CRNA, Anesthesiologist and Surgeon  Anesthesia Plan Comments:         Anesthesia Quick Evaluation

## 2014-03-06 NOTE — Discharge Instructions (Signed)
[facility] °Office Phone Number [phone] ° °BREAST BIOPSY/ PARTIAL MASTECTOMY: POST OP INSTRUCTIONS ° °Always review your discharge instruction sheet given to you by the facility where your surgery was performed. ° °IF YOU HAVE DISABILITY OR FAMILY LEAVE FORMS, YOU MUST BRING THEM TO THE OFFICE FOR PROCESSING.  DO NOT GIVE THEM TO YOUR DOCTOR. ° °1. A prescription for pain medication may be given to you upon discharge.  Take your pain medication as prescribed, if needed.  If narcotic pain medicine is not needed, then you may take acetaminophen (Tylenol) or ibuprofen (Advil) as needed. °2. Take your usually prescribed medications unless otherwise directed °3. If you need a refill on your pain medication, please contact your pharmacy.  They will contact our office to request authorization.  Prescriptions will not be filled after 5pm or on week-ends. °4. You should eat very light the first 24 hours after surgery, such as soup, crackers, pudding, etc.  Resume your normal diet the day after surgery. °5. Most patients will experience some swelling and bruising in the breast.  Ice packs and a good support bra will help.  Swelling and bruising can take several days to resolve.  °6. It is common to experience some constipation if taking pain medication after surgery.  Increasing fluid intake and taking a stool softener will usually help or prevent this problem from occurring.  A mild laxative (Milk of Magnesia or Miralax) should be taken according to package directions if there are no bowel movements after 48 hours. °7. Unless discharge instructions indicate otherwise, you may remove your bandages 24-48 hours after surgery, and you may shower at that time.  You may have steri-strips (small skin tapes) in place directly over the incision.  These strips should be left on the skin for 7-10 days.  If your surgeon used skin glue on the incision, you may shower in 24 hours.  The glue will flake off over the  next 2-3 weeks.  Any sutures or staples will be removed at the office during your follow-up visit. °8. ACTIVITIES:  You may resume regular daily activities (gradually increasing) beginning the next day.  Wearing a good support bra or sports bra minimizes pain and swelling.  You may have sexual intercourse when it is comfortable. °a. You may drive when you no longer are taking prescription pain medication, you can comfortably wear a seatbelt, and you can safely maneuver your car and apply brakes. °b. RETURN TO WORK:  ______________________________________________________________________________________ °9. You should see your doctor in the office for a follow-up appointment approximately two weeks after your surgery.  Your doctor’s nurse will typically make your follow-up appointment when she calls you with your pathology report.  Expect your pathology report 2-3 business days after your surgery.  You may call to check if you do not hear from us after three days. °10. OTHER INSTRUCTIONS: _______________________________________________________________________________________________ _____________________________________________________________________________________________________________________________________ °_____________________________________________________________________________________________________________________________________ °_____________________________________________________________________________________________________________________________________ ° °WHEN TO CALL YOUR DOCTOR: °1. Fever over 101.0 °2. Nausea and/or vomiting. °3. Extreme swelling or bruising. °4. Continued bleeding from incision. °5. Increased pain, redness, or drainage from the incision. ° °The clinic staff is available to answer your questions during regular business hours.  Please don’t hesitate to call and ask to speak to one of the nurses for clinical concerns.  If you have a medical emergency, go to the nearest  emergency room or call 911.  A surgeon from [facility] is always on call at the hospital. ° °For further questions, please visit [email or web address]  ° ° °  Post Anesthesia Home Care Instructions ° °Activity: °Get plenty of rest for the remainder of the day. A responsible adult should stay with you for 24 hours following the procedure.  °For the next 24 hours, DO NOT: °-Drive a car °-Operate machinery °-Drink alcoholic beverages °-Take any medication unless instructed by your physician °-Make any legal decisions or sign important papers. ° °Meals: °Start with liquid foods such as gelatin or soup. Progress to regular foods as tolerated. Avoid greasy, spicy, heavy foods. If nausea and/or vomiting occur, drink only clear liquids until the nausea and/or vomiting subsides. Call your physician if vomiting continues. ° °Special Instructions/Symptoms: °Your throat may feel dry or sore from the anesthesia or the breathing tube placed in your throat during surgery. If this causes discomfort, gargle with warm salt water. The discomfort should disappear within 24 hours. ° °

## 2014-03-06 NOTE — Anesthesia Procedure Notes (Signed)
Procedure Name: LMA Insertion Date/Time: 03/06/2014 1:21 PM Performed by: Lieutenant Diego Pre-anesthesia Checklist: Patient identified, Emergency Drugs available, Suction available and Patient being monitored Patient Re-evaluated:Patient Re-evaluated prior to inductionOxygen Delivery Method: Circle System Utilized Preoxygenation: Pre-oxygenation with 100% oxygen Intubation Type: IV induction Ventilation: Mask ventilation without difficulty LMA: LMA inserted LMA Size: 4.0 Number of attempts: 1 Airway Equipment and Method: bite block Placement Confirmation: positive ETCO2 and breath sounds checked- equal and bilateral Tube secured with: Tape Dental Injury: Teeth and Oropharynx as per pre-operative assessment

## 2014-03-06 NOTE — Interval H&P Note (Signed)
History and Physical Interval Note:  03/06/2014 1:01 PM  Karla Willis  has presented today for surgery, with the diagnosis of dcis right breast   The various methods of treatment have been discussed with the patient and family. After consideration of risks, benefits and other options for treatment, the patient has consented to  Procedure(s): RE-EXCISION OF BREAST LUMPECTOMY (Right) as a surgical intervention .  The patient's history has been reviewed, patient examined, no change in status, stable for surgery.  I have reviewed the patient's chart and labs.  Questions were answered to the patient's satisfaction.     Darene Lamer Dequon Schnebly

## 2014-03-06 NOTE — Anesthesia Postprocedure Evaluation (Signed)
Anesthesia Post Note  Patient: Karla Willis  Procedure(s) Performed: Procedure(s) (LRB): RE-EXCISION OF BREAST LUMPECTOMY (Right)  Anesthesia type: general  Patient location: PACU  Post pain: Pain level controlled  Post assessment: Patient's Cardiovascular Status Stable  Last Vitals:  Filed Vitals:   03/06/14 1444  BP: 111/65  Pulse: 64  Temp:   Resp: 16    Post vital signs: Reviewed and stable  Level of consciousness: sedated  Complications: No apparent anesthesia complications

## 2014-03-06 NOTE — Op Note (Signed)
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 We have discussed options extensively detailed elsewhere and elected to proceed with reexcision of the medial margin and attempt to obtain and adequate margin. Indications alternatives and risks have been discussed in detail elsewhere.  Procedure Detail: patient was brought to the operating room, placed in the supine position on the operating table, and laryngeal mask general anesthesia induced. She received preoperative IV antibiotics. PAS replaced. The right breast was widely sterilely prepped and draped. Patient timeout was performed and correct procedure verified. The previous transverse medial incision was opened and a small seroma evacuated. The lumpectomy cavity was widely opened. I identified the clips marking the previous  medial margin.  I then using cautery widely excise the entire medial margin back another approximately 1.5 cm. This was oriented and sent for permanent pathology. The soft tissue was infiltrated with Marcaine. The wound was irrigated complete hemostasis obtained. The breast and subcutaneous tissue were closed with interrupted 3-0 Vicryl and the skin with subcuticular 4-0 Monocryl and Dermabond. Sponge needle and ischemic counts were correct.   Estimated Blood Loss:  Minimal         Drains: none  Blood Given: none          Specimens: reexcision medial margin previous lumpectomy right breast        Complications:  * No complications entered in OR log *      Disposition: PACU - hemodynamically stable.         Condition: stable

## 2014-03-06 NOTE — H&P (View-Only) (Signed)
Chief complaint followup lumpectomy for DCIS  History: Patient returns for followup status post reexcision of right breast lumpectomy for DCIS. She had 2 close margins they were trying to obtain wider margins so she could avoid radiation. However an apparent new focus of DCIS was found on her reexcision with her medial margin being close at less than 1 mm with a new 1.2 cm focus of DCIS found. This margin was actually wider than this at her original lumpectomy. Other margins however are widely clear. I previously discussed with the patient options which included another attempted reexcision to obtain widely negative margins versus total mastectomy. She would like one more attempt at reexcision I think this is reasonable. We discussed this today including the nature of the surgery and indications and possibility that we would still require mastectomy. She understands and agrees.  Exam: BP 132/78  Pulse 71  Temp(Src) 98.5 F (36.9 C) (Temporal)  Resp 16  Wt 259 lb 12.8 oz (117.845 kg) General: Obese, no distress Breasts: No palpable masses. Healing lumpectomy incision medially right breast  Assessment and plan: DCIS right breast with close margin following 2 excisions. Plan reexcision of her medial margin which is scheduled for later this month.

## 2014-03-07 ENCOUNTER — Encounter (HOSPITAL_BASED_OUTPATIENT_CLINIC_OR_DEPARTMENT_OTHER): Payer: Self-pay | Admitting: General Surgery

## 2014-03-09 ENCOUNTER — Encounter (INDEPENDENT_AMBULATORY_CARE_PROVIDER_SITE_OTHER): Payer: Self-pay | Admitting: Surgery

## 2014-03-09 ENCOUNTER — Telehealth (INDEPENDENT_AMBULATORY_CARE_PROVIDER_SITE_OTHER): Payer: Self-pay

## 2014-03-09 ENCOUNTER — Ambulatory Visit (INDEPENDENT_AMBULATORY_CARE_PROVIDER_SITE_OTHER): Payer: Medicare Other | Admitting: Surgery

## 2014-03-09 VITALS — BP 124/80 | HR 75 | Temp 97.4°F | Resp 16 | Ht 62.0 in | Wt 262.8 lb

## 2014-03-09 DIAGNOSIS — IMO0002 Reserved for concepts with insufficient information to code with codable children: Secondary | ICD-10-CM

## 2014-03-09 NOTE — Telephone Encounter (Signed)
Pt called stating her breast incision is opening and has been bleeding all night. Pt given appt today to see out urg MD. Pt will cover wd with clean, dry dsg.

## 2014-03-09 NOTE — Progress Notes (Signed)
Patient returns 3 days after right breast reexcision by Dr. Excell Seltzer  for for DCIS.She had bloody drainage from the incision he wished to be seen today. No fever or chills.  Exam: Right breast incision intact except for lateral portion which is open about 3 mm. Serous fluid noted. No redness or pus.  Breast, excision, Right breast further meidal margin - BENIGN BREAST PARENCHYMA WITH EXTENSIVE FAT NECROSIS. - NO ATYPIA, HYPERPLASIA, OR MALIGNANCY IDENTIFIED. Microscopic Comment The margins are inked and examined.   Impression: Postoperative seroma 3 days status post right breast reexcision lumpectomy for DCIS with negative margins  Plan: Keep dry dressing over incision. Breast binder. Return next week for recheck.

## 2014-03-09 NOTE — Patient Instructions (Signed)
WEAR BINDER AND COVER WITH DRY GAUZE OR MAXIPAD AND CHANGE AS NEEDED.  THE DRAINAGE WILL SLOW DOWN OVER THE NEXT WEEK.  RETURN TO SEE DR HOXWORTH IN 1 WEEK.  FINAL PATHOLOGY IS CLEAR

## 2014-03-16 ENCOUNTER — Encounter (INDEPENDENT_AMBULATORY_CARE_PROVIDER_SITE_OTHER): Payer: Medicare Other | Admitting: General Surgery

## 2014-03-17 ENCOUNTER — Encounter (INDEPENDENT_AMBULATORY_CARE_PROVIDER_SITE_OTHER): Payer: Self-pay | Admitting: General Surgery

## 2014-03-17 ENCOUNTER — Ambulatory Visit (INDEPENDENT_AMBULATORY_CARE_PROVIDER_SITE_OTHER): Payer: Medicare Other | Admitting: General Surgery

## 2014-03-17 VITALS — BP 130/70 | HR 72 | Temp 97.5°F | Resp 16 | Wt 261.0 lb

## 2014-03-17 DIAGNOSIS — D0510 Intraductal carcinoma in situ of unspecified breast: Secondary | ICD-10-CM

## 2014-03-17 DIAGNOSIS — D0590 Unspecified type of carcinoma in situ of unspecified breast: Secondary | ICD-10-CM

## 2014-03-17 NOTE — Progress Notes (Signed)
History: Patient returns for followup after her third right breast lumpectomy to obtain widely negative margins for DCIS as she is unable to go through radiation therapy due to body habitus her breast size. Fortunately her final pathology has shown only fat necrosis and benign breast parenchyma and all her margins should now be widely clear. She had some cerumen drainage from her wound last week but she says this has resolved  Exam: Right breast incision is well-healed without seroma or other complication. She has followup scheduled with medical oncology.  Assessment and plan: Doing well following a repeat lumpectomy for DCIS as above. Return for long-term follow up in 6 months.

## 2014-05-02 ENCOUNTER — Telehealth: Payer: Self-pay | Admitting: Hematology and Oncology

## 2014-05-02 NOTE — Telephone Encounter (Signed)
, °

## 2014-05-11 ENCOUNTER — Other Ambulatory Visit: Payer: Medicare Other

## 2014-05-11 ENCOUNTER — Ambulatory Visit: Payer: Medicare Other | Admitting: Oncology

## 2014-05-15 ENCOUNTER — Ambulatory Visit: Payer: Medicare Other | Admitting: Oncology

## 2014-08-02 ENCOUNTER — Other Ambulatory Visit: Payer: Self-pay | Admitting: *Deleted

## 2014-08-02 DIAGNOSIS — C50311 Malignant neoplasm of lower-inner quadrant of right female breast: Secondary | ICD-10-CM

## 2014-08-03 ENCOUNTER — Encounter: Payer: Self-pay | Admitting: Hematology and Oncology

## 2014-08-03 ENCOUNTER — Ambulatory Visit (HOSPITAL_BASED_OUTPATIENT_CLINIC_OR_DEPARTMENT_OTHER): Payer: Medicare Other | Admitting: Hematology and Oncology

## 2014-08-03 ENCOUNTER — Other Ambulatory Visit (HOSPITAL_BASED_OUTPATIENT_CLINIC_OR_DEPARTMENT_OTHER): Payer: Medicare Other

## 2014-08-03 VITALS — BP 132/51 | HR 91 | Temp 97.1°F | Resp 18 | Ht 62.0 in | Wt 259.2 lb

## 2014-08-03 DIAGNOSIS — Z79810 Long term (current) use of selective estrogen receptor modulators (SERMs): Secondary | ICD-10-CM

## 2014-08-03 DIAGNOSIS — C50311 Malignant neoplasm of lower-inner quadrant of right female breast: Secondary | ICD-10-CM

## 2014-08-03 DIAGNOSIS — Z853 Personal history of malignant neoplasm of breast: Secondary | ICD-10-CM

## 2014-08-03 LAB — COMPREHENSIVE METABOLIC PANEL (CC13)
ALT: 7 U/L (ref 0–55)
AST: 14 U/L (ref 5–34)
Albumin: 3.5 g/dL (ref 3.5–5.0)
Alkaline Phosphatase: 50 U/L (ref 40–150)
Anion Gap: 9 mEq/L (ref 3–11)
BUN: 27.5 mg/dL — ABNORMAL HIGH (ref 7.0–26.0)
CO2: 24 mEq/L (ref 22–29)
Calcium: 9.9 mg/dL (ref 8.4–10.4)
Chloride: 110 mEq/L — ABNORMAL HIGH (ref 98–109)
Creatinine: 1.5 mg/dL — ABNORMAL HIGH (ref 0.6–1.1)
Glucose: 115 mg/dl (ref 70–140)
Potassium: 3.9 mEq/L (ref 3.5–5.1)
Sodium: 143 mEq/L (ref 136–145)
Total Bilirubin: 0.37 mg/dL (ref 0.20–1.20)
Total Protein: 7.2 g/dL (ref 6.4–8.3)

## 2014-08-03 LAB — CBC WITH DIFFERENTIAL/PLATELET
BASO%: 0.8 % (ref 0.0–2.0)
Basophils Absolute: 0 10*3/uL (ref 0.0–0.1)
EOS%: 3.8 % (ref 0.0–7.0)
Eosinophils Absolute: 0.2 10*3/uL (ref 0.0–0.5)
HCT: 35.1 % (ref 34.8–46.6)
HGB: 11.7 g/dL (ref 11.6–15.9)
LYMPH%: 23.6 % (ref 14.0–49.7)
MCH: 30.3 pg (ref 25.1–34.0)
MCHC: 33.3 g/dL (ref 31.5–36.0)
MCV: 90.9 fL (ref 79.5–101.0)
MONO#: 0.5 10*3/uL (ref 0.1–0.9)
MONO%: 12.8 % (ref 0.0–14.0)
NEUT#: 2.5 10*3/uL (ref 1.5–6.5)
NEUT%: 59 % (ref 38.4–76.8)
Platelets: 176 10*3/uL (ref 145–400)
RBC: 3.86 10*6/uL (ref 3.70–5.45)
RDW: 14.5 % (ref 11.2–14.5)
WBC: 4.3 10*3/uL (ref 3.9–10.3)
lymph#: 1 10*3/uL (ref 0.9–3.3)

## 2014-08-03 NOTE — Assessment & Plan Note (Signed)
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 Patient did not undergo radiation therapy he 100% PR 60% positive. She was started on tamoxifen in April 2015. Patient is tolerating tamoxifen extremely well without any major problems or concerns. She reports occasional hot flashes. Denies any major complaints.   Surveillance: Mammograms scheduled for November. Today's breast exam did not reveal any abnormalities. Survivorship:Discussed the importance of physical exercise in decreasing the likelihood of breast cancer recurrence. Recommended 30 mins daily 6 days a week of either brisk walking or cycling or swimming. Encouraged patient to eat more fruits and vegetables and decrease red meat.   Return to clinic in 6 months for followup.

## 2014-08-03 NOTE — Progress Notes (Signed)
Patient Care Team: Karla Lei, MD as PCP - General (Family Medicine)  DIAGNOSIS: DCIS (ductal carcinoma in situ) of breast   Primary site: Breast (Right)   Staging method: AJCC 7th Edition   Clinical: Stage 0 (Tis, N0, cM0)   Summary: Stage 0 (Tis, N0, cM0)   SUMMARY OF ONCOLOGIC HISTORY:   Breast cancer of lower-inner quadrant of right female breast   10/19/2013 Initial Diagnosis DCIS ER/PR positive low grade   12/16/2013 Surgery Lumpectomy right breast: DCIS with calcifications low-grade 1.6 cm focally 0.1 cm to lateral margin, excision of right posterior margin continued to show DCIS that led to the third surgery for reexcision which was negative for DCIS ER 100% PR 60%   01/09/2014 -  Anti-estrogen oral therapy Tamoxifen 20 mg daily (patient not a candidate for radiation because of body habitus)    CHIEF COMPLIANT: six-month followup of DCIS.  INTERVAL HISTORY: Karla Willis is a 79 year old lady with above-mentioned history of DCIS involving right breast.she is here for routine six-month followup. She reports that she is tolerating tamoxifen fairly well without any major problems. Occasionally she gets hot flashes but is not bothering her. She denies any lumps or nodules in the breast.  REVIEW OF SYSTEMS:   Constitutional: Denies fevers, chills or abnormal weight loss Eyes: Denies blurriness of vision Ears, nose, mouth, throat, and face: Denies mucositis or sore throat Respiratory: Denies cough, dyspnea or wheezes Cardiovascular: Denies palpitation, chest discomfort or lower extremity swelling Gastrointestinal:  Denies nausea, heartburn or change in bowel habits Skin: Denies abnormal skin rashes Lymphatics: Denies new lymphadenopathy or easy bruising Neurological:Denies numbness, tingling or new weaknesses Behavioral/Psych: Mood is stable, no new changes  Breast:  denies any pain or lumps or nodules in either breasts All other systems were reviewed with the patient and are  negative.  I have reviewed the past medical history, past surgical history, social history and family history with the patient and they are unchanged from previous note.  ALLERGIES:  is allergic to hydrocodone and codeine.  MEDICATIONS:  Current Outpatient Prescriptions  Medication Sig Dispense Refill  . amLODipine (NORVASC) 5 MG tablet Take 5 mg by mouth daily.      Marland Kitchen levothyroxine (SYNTHROID, LEVOTHROID) 100 MCG tablet Take 100 mcg by mouth daily before breakfast.      . lisinopril-hydrochlorothiazide (PRINZIDE,ZESTORETIC) 10-12.5 MG per tablet Take 1 tablet by mouth daily.      . pioglitazone (ACTOS) 30 MG tablet Take 30 mg by mouth daily.      . tamoxifen (NOLVADEX) 20 MG tablet Take 20 mg by mouth daily.       No current facility-administered medications for this visit.    PHYSICAL EXAMINATION: ECOG PERFORMANCE STATUS: 0 - Asymptomatic  Filed Vitals:   08/03/14 1058  BP: 132/51  Pulse: 91  Temp: 97.1 F (36.2 C)  Resp: 18   Filed Weights   08/03/14 1058  Weight: 259 lb 3.2 oz (117.572 kg)    GENERAL:alert, no distress and comfortable SKIN: skin color, texture, turgor are normal, no rashes or significant lesions EYES: normal, Conjunctiva are pink and non-injected, sclera clear OROPHARYNX:no exudate, no erythema and lips, buccal mucosa, and tongue normal  NECK: supple, thyroid normal size, non-tender, without nodularity LYMPH:  no palpable lymphadenopathy in the cervical, axillary or inguinal LUNGS: clear to auscultation and percussion with normal breathing effort HEART: regular rate & rhythm and no murmurs and no lower extremity edema ABDOMEN:abdomen soft, non-tender and normal bowel sounds Musculoskeletal:no cyanosis of digits  and no clubbing  NEURO: alert & oriented x 3 with fluent speech, no focal motor/sensory deficits BREAST: No palpable masses or nodules in either right or left breasts. No palpable axillary supraclavicular or infraclavicular adenopathy no breast  tenderness or nipple discharge.   LABORATORY DATA:  I have reviewed the data as listed   Chemistry      Component Value Date/Time   NA 143 08/03/2014 1039   NA 141 03/03/2014 1501   K 3.9 08/03/2014 1039   K 5.2 03/03/2014 1501   CL 109 03/03/2014 1501   CO2 24 08/03/2014 1039   CO2 25 12/12/2013 1335   BUN 27.5* 08/03/2014 1039   BUN 35* 03/03/2014 1501   CREATININE 1.5* 08/03/2014 1039   CREATININE 1.40* 03/03/2014 1501      Component Value Date/Time   CALCIUM 9.9 08/03/2014 1039   CALCIUM 9.8 12/12/2013 1335   ALKPHOS 50 08/03/2014 1039   AST 14 08/03/2014 1039   ALT 7 08/03/2014 1039   BILITOT 0.37 08/03/2014 1039       Lab Results  Component Value Date   WBC 4.3 08/03/2014   HGB 11.7 08/03/2014   HCT 35.1 08/03/2014   MCV 90.9 08/03/2014   PLT 176 08/03/2014   NEUTROABS 2.5 08/03/2014     RADIOGRAPHIC STUDIES: INo results found.   ASSESSMENT & PLAN:  Breast cancer of lower-inner quadrant of right female breast DCIS right breast 1.6 cm wide 3 reexcision surgeries were negative margins. Patient did not undergo radiation therapy he 100% PR 60% positive. She was started on tamoxifen in April 2015. Patient is tolerating tamoxifen extremely well without any major problems or concerns. She reports occasional hot flashes. Denies any major complaints.   Surveillance: Mammograms scheduled for November. Today's breast exam did not reveal any abnormalities. Survivorship:Discussed the importance of physical exercise in decreasing the likelihood of breast cancer recurrence. Recommended 30 mins daily 6 days a week of either brisk walking or cycling or swimming. Encouraged patient to eat more fruits and vegetables and decrease red meat.   Return to clinic in 6 months for followup.   Orders Placed This Encounter  Procedures  . MM Digital Diagnostic Bilat    EPIC ORDER   PF: 09/28/2013 [facility]    NO NEEDS   JB/CLAMESIA  [phone]   [employer] MEDICARE     Standing Status: Future     Number of Occurrences:       Standing Expiration Date: 08/03/2015    Order Specific Question:  Reason for Exam (SYMPTOM  OR DIAGNOSIS REQUIRED)    Answer:  Right DCIS Annual follow up    Order Specific Question:  Preferred imaging location?    Answer:  [facility]  . CBC with Differential    Standing Status: Future     Number of Occurrences:      Standing Expiration Date: 08/03/2015  . Comprehensive metabolic panel (Cmet) - [facility]    Standing Status: Future     Number of Occurrences:      Standing Expiration Date: 08/03/2015   The patient has a good understanding of the overall plan. she agrees with it. She will call with any problems that may develop before her next visit here.  I spent 15 minutes counseling the patient face to face. The total time spent in the appointment was 20 minutes and more than 50% was on counseling and review of test results    Karla Eisenmenger, MD 08/03/2014 1:00 PM

## 2014-08-25 ENCOUNTER — Other Ambulatory Visit: Payer: Self-pay

## 2014-10-02 ENCOUNTER — Ambulatory Visit
Admission: RE | Admit: 2014-10-02 | Discharge: 2014-10-02 | Disposition: A | Discharge status: Home / Self Care | Payer: Medicare Other | Source: Ambulatory Visit | Attending: Hematology and Oncology | Admitting: Hematology and Oncology

## 2014-10-02 DIAGNOSIS — C50311 Malignant neoplasm of lower-inner quadrant of right female breast: Secondary | ICD-10-CM

## 2014-11-27 ENCOUNTER — Ambulatory Visit
Admission: RE | Admit: 2014-11-27 | Discharge: 2014-11-27 | Disposition: A | Discharge status: Home / Self Care | Payer: Medicare Other | Source: Ambulatory Visit | Attending: Family Medicine | Admitting: Family Medicine

## 2014-11-27 ENCOUNTER — Other Ambulatory Visit: Payer: Self-pay | Admitting: Family Medicine

## 2014-11-27 DIAGNOSIS — M12571 Traumatic arthropathy, right ankle and foot: Secondary | ICD-10-CM

## 2014-11-27 DIAGNOSIS — I119 Hypertensive heart disease without heart failure: Secondary | ICD-10-CM | POA: Diagnosis not present

## 2014-11-27 DIAGNOSIS — E0800 Diabetes mellitus due to underlying condition with hyperosmolarity without nonketotic hyperglycemic-hyperosmolar coma (NKHHC): Secondary | ICD-10-CM | POA: Diagnosis not present

## 2014-11-27 DIAGNOSIS — M25571 Pain in right ankle and joints of right foot: Secondary | ICD-10-CM | POA: Diagnosis not present

## 2014-11-27 DIAGNOSIS — E6609 Other obesity due to excess calories: Secondary | ICD-10-CM | POA: Diagnosis not present

## 2014-11-27 DIAGNOSIS — M779 Enthesopathy, unspecified: Secondary | ICD-10-CM | POA: Diagnosis not present

## 2014-12-12 DIAGNOSIS — I119 Hypertensive heart disease without heart failure: Secondary | ICD-10-CM | POA: Diagnosis not present

## 2014-12-12 DIAGNOSIS — E0800 Diabetes mellitus due to underlying condition with hyperosmolarity without nonketotic hyperglycemic-hyperosmolar coma (NKHHC): Secondary | ICD-10-CM | POA: Diagnosis not present

## 2014-12-12 DIAGNOSIS — M1000 Idiopathic gout, unspecified site: Secondary | ICD-10-CM | POA: Diagnosis not present

## 2015-01-31 ENCOUNTER — Telehealth: Payer: Self-pay | Admitting: Hematology and Oncology

## 2015-01-31 NOTE — Telephone Encounter (Signed)
Appointments r/s due to transport   anne

## 2015-02-01 ENCOUNTER — Other Ambulatory Visit: Payer: Medicare Other

## 2015-02-01 ENCOUNTER — Ambulatory Visit: Payer: Medicare Other | Admitting: Hematology and Oncology

## 2015-02-05 ENCOUNTER — Other Ambulatory Visit: Payer: Self-pay | Admitting: *Deleted

## 2015-02-05 MED ORDER — TAMOXIFEN CITRATE 20 MG PO TABS: 20.0000 mg | ORAL_TABLET | Freq: Every day | ORAL | Status: DC

## 2015-02-07 ENCOUNTER — Ambulatory Visit (HOSPITAL_BASED_OUTPATIENT_CLINIC_OR_DEPARTMENT_OTHER): Payer: Medicare Other | Admitting: Hematology and Oncology

## 2015-02-07 ENCOUNTER — Other Ambulatory Visit: Payer: Self-pay | Admitting: *Deleted

## 2015-02-07 ENCOUNTER — Ambulatory Visit (HOSPITAL_COMMUNITY)
Admission: RE | Admit: 2015-02-07 | Discharge: 2015-02-07 | Disposition: A | Discharge status: Home / Self Care | Payer: Medicare Other | Source: Ambulatory Visit | Attending: Hematology and Oncology | Admitting: Hematology and Oncology

## 2015-02-07 ENCOUNTER — Telehealth: Payer: Self-pay | Admitting: *Deleted

## 2015-02-07 ENCOUNTER — Other Ambulatory Visit (HOSPITAL_BASED_OUTPATIENT_CLINIC_OR_DEPARTMENT_OTHER): Payer: Medicare Other

## 2015-02-07 ENCOUNTER — Telehealth: Payer: Self-pay | Admitting: Hematology and Oncology

## 2015-02-07 DIAGNOSIS — C50311 Malignant neoplasm of lower-inner quadrant of right female breast: Secondary | ICD-10-CM

## 2015-02-07 DIAGNOSIS — D0511 Intraductal carcinoma in situ of right breast: Secondary | ICD-10-CM

## 2015-02-07 DIAGNOSIS — M79605 Pain in left leg: Secondary | ICD-10-CM

## 2015-02-07 DIAGNOSIS — Z170 Estrogen receptor positive status [ER+]: Secondary | ICD-10-CM

## 2015-02-07 DIAGNOSIS — R609 Edema, unspecified: Secondary | ICD-10-CM | POA: Diagnosis not present

## 2015-02-07 LAB — CBC WITH DIFFERENTIAL/PLATELET
BASO%: 0.7 % (ref 0.0–2.0)
Basophils Absolute: 0 10*3/uL (ref 0.0–0.1)
EOS%: 4.1 % (ref 0.0–7.0)
Eosinophils Absolute: 0.2 10*3/uL (ref 0.0–0.5)
HCT: 34.7 % — ABNORMAL LOW (ref 34.8–46.6)
HGB: 11.1 g/dL — ABNORMAL LOW (ref 11.6–15.9)
LYMPH%: 28.2 % (ref 14.0–49.7)
MCH: 28.4 pg (ref 25.1–34.0)
MCHC: 32 g/dL (ref 31.5–36.0)
MCV: 88.5 fL (ref 79.5–101.0)
MONO#: 0.5 10*3/uL (ref 0.1–0.9)
MONO%: 12.8 % (ref 0.0–14.0)
NEUT#: 2.1 10*3/uL (ref 1.5–6.5)
NEUT%: 54.2 % (ref 38.4–76.8)
Platelets: 162 10*3/uL (ref 145–400)
RBC: 3.93 10*6/uL (ref 3.70–5.45)
RDW: 14.4 % (ref 11.2–14.5)
WBC: 3.9 10*3/uL (ref 3.9–10.3)
lymph#: 1.1 10*3/uL (ref 0.9–3.3)

## 2015-02-07 LAB — COMPREHENSIVE METABOLIC PANEL (CC13)
ALT: 11 U/L (ref 0–55)
AST: 17 U/L (ref 5–34)
Albumin: 3.3 g/dL — ABNORMAL LOW (ref 3.5–5.0)
Alkaline Phosphatase: 53 U/L (ref 40–150)
Anion Gap: 11 mEq/L (ref 3–11)
BUN: 21.3 mg/dL (ref 7.0–26.0)
CO2: 22 mEq/L (ref 22–29)
Calcium: 9 mg/dL (ref 8.4–10.4)
Chloride: 109 mEq/L (ref 98–109)
Creatinine: 1.1 mg/dL (ref 0.6–1.1)
EGFR: 54 mL/min/{1.73_m2} — ABNORMAL LOW (ref >90)
Glucose: 103 mg/dl (ref 70–140)
Potassium: 4.6 mEq/L (ref 3.5–5.1)
Sodium: 143 mEq/L (ref 136–145)
Total Bilirubin: 0.34 mg/dL (ref 0.20–1.20)
Total Protein: 6.6 g/dL (ref 6.4–8.3)

## 2015-02-07 NOTE — Telephone Encounter (Signed)
Patient can keep appointment for LE venous doppler today at 2:00pm.

## 2015-02-07 NOTE — Assessment & Plan Note (Signed)
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 Patient did not undergo radiation therapy he 100% PR 60% positive. She was started on tamoxifen in April 2015.  Tamoxifen toxicities: 1. Hot flashes occasional  Breast cancer surveillance: 1. Mammograms November 2015 were normal 2. Breast exam 02/07/2015 is normal  Left leg pain: Associated bilateral leg swelling. I recommended getting an ultrasound of the leg to rule out DVT. I will discuss the results with her on the phone. Return to clinic in 6 months for follow-up

## 2015-02-07 NOTE — Progress Notes (Signed)
Patient Care Team: Lucianne Lei, MD as PCP - General (Family Medicine)  DIAGNOSIS: DCIS (ductal carcinoma in situ) of breast   Staging form: Breast, AJCC 7th Edition     Clinical: Stage 0 (Tis, N0, cM0) - Unsigned     Pathologic: No stage assigned - Unsigned   SUMMARY OF ONCOLOGIC HISTORY:   Breast cancer of lower-inner quadrant of right female breast   10/19/2013 Initial Diagnosis DCIS ER/PR positive low grade   12/16/2013 Surgery Lumpectomy right breast: DCIS with calcifications low-grade 1.6 cm focally 0.1 cm to lateral margin, excision of right posterior margin continued to show DCIS that led to the third surgery for reexcision which was negative for DCIS ER 100% PR 60%   01/09/2014 -  Anti-estrogen oral therapy Tamoxifen 20 mg daily (patient not a candidate for radiation because of body habitus)    CHIEF COMPLIANT: Six-month follow-up of breast cancer complains of left leg pain  INTERVAL HISTORY: Karla Willis is a 80 year old lady with above-mentioned history of left breast DCIS who is currently on tamoxifen therapy. She does not have any major prostate and tamoxifen. She complains of left leg pain. She had an x-ray of the ankle done in January which was normal other than arthritis. She has bilateral leg edema. This is chronic from before.  REVIEW OF SYSTEMS:   Constitutional: Denies fevers, chills or abnormal weight loss Eyes: Denies blurriness of vision Ears, nose, mouth, throat, and face: Denies mucositis or sore throat Respiratory: Denies cough, dyspnea or wheezes Cardiovascular: Denies palpitation, chest discomfort;  chronic leg edema Gastrointestinal:  Denies nausea, heartburn or change in bowel habits Skin: Denies abnormal skin rashes Lymphatics: Denies new lymphadenopathy or easy bruising Neurological:Denies numbness, tingling or new weaknesses Behavioral/Psych: Mood is stable, no new changes  Breast:  denies any pain or lumps or nodules in either breasts All other systems  were reviewed with the patient and are negative.  I have reviewed the past medical history, past surgical history, social history and family history with the patient and they are unchanged from previous note.  ALLERGIES:  is allergic to hydrocodone and codeine.  MEDICATIONS:  Current Outpatient Prescriptions  Medication Sig Dispense Refill  . amLODipine (NORVASC) 5 MG tablet Take 5 mg by mouth daily.    . Colchicine 0.6 MG CAPS Take 1 tablet by mouth daily.  0  . levothyroxine (SYNTHROID, LEVOTHROID) 100 MCG tablet Take 100 mcg by mouth daily before breakfast.    . lisinopril-hydrochlorothiazide (PRINZIDE,ZESTORETIC) 10-12.5 MG per tablet Take 1 tablet by mouth daily.    . pioglitazone (ACTOS) 30 MG tablet Take 30 mg by mouth daily.    . tamoxifen (NOLVADEX) 20 MG tablet Take 1 tablet (20 mg total) by mouth daily. 90 tablet 0   No current facility-administered medications for this visit.    PHYSICAL EXAMINATION: ECOG PERFORMANCE STATUS: 1 - Symptomatic but completely ambulatory  Filed Vitals:   02/07/15 1122  BP: 150/64  Pulse: 74  Temp: 97.6 F (36.4 C)  Resp: 18   Filed Weights   02/07/15 1122  Weight: 265 lb 11.2 oz (120.521 kg)    GENERAL:alert, no distress and comfortable SKIN: skin color, texture, turgor are normal, no rashes or significant lesions EYES: normal, Conjunctiva are pink and non-injected, sclera clear OROPHARYNX:no exudate, no erythema and lips, buccal mucosa, and tongue normal  NECK: supple, thyroid normal size, non-tender, without nodularity LYMPH:  no palpable lymphadenopathy in the cervical, axillary or inguinal LUNGS: clear to auscultation and percussion with  normal breathing effort HEART: regular rate & rhythm and no murmurs and no lower extremity edema ABDOMEN:abdomen soft, non-tender and normal bowel sounds Musculoskeletal:no cyanosis of digits and no clubbing  NEURO: alert & oriented x 3 with fluent speech, no focal motor/sensory  deficits  LABORATORY DATA:  I have reviewed the data as listed   Chemistry      Component Value Date/Time   NA 143 02/07/2015 1106   NA 141 03/03/2014 1501   K 4.6 02/07/2015 1106   K 5.2 03/03/2014 1501   CL 109 03/03/2014 1501   CO2 22 02/07/2015 1106   CO2 25 12/12/2013 1335   BUN 21.3 02/07/2015 1106   BUN 35* 03/03/2014 1501   CREATININE 1.1 02/07/2015 1106   CREATININE 1.40* 03/03/2014 1501      Component Value Date/Time   CALCIUM 9.0 02/07/2015 1106   CALCIUM 9.8 12/12/2013 1335   ALKPHOS 53 02/07/2015 1106   AST 17 02/07/2015 1106   ALT 11 02/07/2015 1106   BILITOT 0.34 02/07/2015 1106       Lab Results  Component Value Date   WBC 3.9 02/07/2015   HGB 11.1* 02/07/2015   HCT 34.7* 02/07/2015   MCV 88.5 02/07/2015   PLT 162 02/07/2015   NEUTROABS 2.1 02/07/2015    ASSESSMENT & PLAN:  Breast cancer of lower-inner quadrant of right female breast DCIS right breast 1.6 cm wide 3 reexcision surgeries were negative margins. Patient did not undergo radiation therapy he 100% PR 60% positive. She was started on tamoxifen in April 2015.  Tamoxifen toxicities: 1. Hot flashes occasional  Breast cancer surveillance: 1. Mammograms November 2015 were normal 2. Breast exam 02/07/2015 is normal  Left leg pain: Associated bilateral leg swelling. I recommended getting an ultrasound of the leg to rule out DVT. I will discuss the results with her on the phone. Return to clinic in 6 months for follow-up     Orders Placed This Encounter  Procedures  . [place] Extrem Up Bilat Comp    Standing Status: Future     Number of Occurrences:      Standing Expiration Date: 04/08/2016    Order Specific Question:  Reason for Exam (SYMPTOM  OR DIAGNOSIS REQUIRED)    Answer:  Left leg pain and bilateral leg swelling    Order Specific Question:  Preferred imaging location?    Answer:  [facility]   The patient has a good understanding of the overall plan. she agrees with it.  She will call with any problems that may develop before her next visit here.   Rulon Eisenmenger, MD

## 2015-02-07 NOTE — Telephone Encounter (Signed)
per pof to sch pt appt-gave pt copy of sch[facility]adv [facility] will call to sch scans w/pt pt directly

## 2015-02-07 NOTE — Progress Notes (Signed)
Contacted Vascular Lab to schedule the venous doppler of LE.  Scheduled for 2:00 pm today.

## 2015-02-07 NOTE — Telephone Encounter (Signed)
Dr. [name] notified.

## 2015-02-07 NOTE — Progress Notes (Signed)
*  PRELIMINARY RESULTS* Vascular Ultrasound Lower extremity venous duplex has been completed.  Preliminary findings: negative for DVT in visualized veins.   Called results to [facility].   Landry Mellow, RDMS, RVT  02/07/2015, 2:24 PM

## 2015-02-07 NOTE — Telephone Encounter (Signed)
Karla Willis from the [facility] Lab called to say that [name]'s doppler study was completely negative.

## 2015-03-09 ENCOUNTER — Ambulatory Visit (INDEPENDENT_AMBULATORY_CARE_PROVIDER_SITE_OTHER): Payer: Medicare Other | Admitting: Internal Medicine

## 2015-03-09 ENCOUNTER — Encounter: Payer: Self-pay | Admitting: Internal Medicine

## 2015-03-09 VITALS — BP 120/68 | HR 82 | Temp 97.8°F | Resp 20 | Ht 62.0 in | Wt 268.0 lb

## 2015-03-09 DIAGNOSIS — M150 Primary generalized (osteo)arthritis: Secondary | ICD-10-CM

## 2015-03-09 DIAGNOSIS — E038 Other specified hypothyroidism: Secondary | ICD-10-CM | POA: Diagnosis not present

## 2015-03-09 DIAGNOSIS — M1000 Idiopathic gout, unspecified site: Secondary | ICD-10-CM | POA: Diagnosis not present

## 2015-03-09 DIAGNOSIS — I10 Essential (primary) hypertension: Secondary | ICD-10-CM | POA: Insufficient documentation

## 2015-03-09 DIAGNOSIS — E034 Atrophy of thyroid (acquired): Secondary | ICD-10-CM

## 2015-03-09 DIAGNOSIS — D179 Benign lipomatous neoplasm, unspecified: Secondary | ICD-10-CM | POA: Diagnosis not present

## 2015-03-09 DIAGNOSIS — M8949 Other hypertrophic osteoarthropathy, multiple sites: Secondary | ICD-10-CM

## 2015-03-09 DIAGNOSIS — E119 Type 2 diabetes mellitus without complications: Secondary | ICD-10-CM

## 2015-03-09 DIAGNOSIS — M7072 Other bursitis of hip, left hip: Secondary | ICD-10-CM

## 2015-03-09 DIAGNOSIS — M159 Polyosteoarthritis, unspecified: Secondary | ICD-10-CM | POA: Insufficient documentation

## 2015-03-09 DIAGNOSIS — E049 Nontoxic goiter, unspecified: Secondary | ICD-10-CM | POA: Diagnosis not present

## 2015-03-09 DIAGNOSIS — E039 Hypothyroidism, unspecified: Secondary | ICD-10-CM | POA: Insufficient documentation

## 2015-03-09 NOTE — Patient Instructions (Signed)
Continue current medications as ordered  Will call with lab results, Orthopedic and thyroid Ultrasound appt  Follow up in 3 mos  Keep appointments with Oncology as scheduled

## 2015-03-09 NOTE — Progress Notes (Signed)
Patient ID: Karla Willis, female   DOB: 31-May-1935, 80 y.o.   MRN: [id number]    Location:    [facility]    Place of Service:   OFFICE    Chief Complaint  Patient presents with  . Establish Care    Establish care, Discuss labs (copy printed )    HPI:  80 yo female seen today as a new pt. She c/o left hip pain that radiates down to foot. She had venous doppler US at the end of March that was neg for DVT. She as a hx arthritis. No xrays done of hip. No falls. She uses a cane to ambulate.  She has a hx breast CA s/p resection and is currently on tamoxifen. She has occasional hotflashes. Her H/O is Dr Loleta Dicker.  She has cold intolerance. She takes levothyroxine for hypothyroid. No CP, SOB, palpitations, constipation. No anxiety/depression. She has a goiter and has not had [place] in several yrs. She had a partial thyroidectomy  BP stable on amlodipine and lisinopril HCT  She takes actos for diabetes. BS low 100s. No low BS reactions. She has numbness/tingling in fingers.  Takes prn colchicine for gout. She has participated in a gout study in the past. She received a letter from [employer] and was told colchicine would only be covered fot limited amt of tabs. It cost her $160 for 31 tabs. Last uric acid level was high. She has occasional pain in her toes and she takes colchicine which helps.  Past Medical History  Diagnosis Date  . Allergy   . Cancer   . Diabetes mellitus without complication   . Thyroid disease   . Hypertension   . Wears glasses   . HOH (hard of hearing)   . Arthritis   . Gout   . 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 Gallbladder sugery    . Thyroid surgery  1970  . Cholecystectomy  1970  . Tubal ligation    . Finger surgery      right hand  . Shoulder arthroscopy  2011    left  . Colonoscopy    . Breast lumpectomy with needle localization Right 12/16/2013    Procedure: BREAST LUMPECTOMY  WITH NEEDLE LOCALIZATION;  Surgeon: Edward Jolly, MD;  Location: [facility];  Service: General;  Laterality: Right;  . Breast biopsy Right     remote, benign  . Re-excision of breast lumpectomy Right 01/30/2014    Procedure: RE-EXCISION OF BREAST LUMPECTOMY;  Surgeon: Edward Jolly, MD;  Location: [facility];  Service: General;  Laterality: Right;  . Re-excision of breast lumpectomy Right 03/06/2014    Procedure: RE-EXCISION OF BREAST LUMPECTOMY;  Surgeon: Edward Jolly, MD;  Location: [facility];  Service: General;  Laterality: Right;    Patient Care Team: Lucianne Lei, MD as PCP - General (Family Medicine)  History   Social History  . Marital Status: Widowed    Spouse Name: N/A  . Number of Children: N/A  . Years of Education: N/A   Occupational History  . Not on file.   Social History Main Topics  . Smoking status: Never Smoker   . Smokeless tobacco: Not on file  . Alcohol Use: No  . Drug Use: No  . Sexual Activity: Not Currently     Comment: menarche age 15, G40, P46, first live birth age 79,  menopause at 48, no HRT   Other Topics Concern  . Not on file   Social History Narrative     reports that she has never smoked. She does not have any smokeless tobacco history on file. She reports that she does not drink alcohol or use illicit drugs.  Family History  Problem Relation Age of Onset  . Seizures Sister   . Cancer Sister     breast   Family Status  Relation Status Death Age  . Mother Deceased 57  . Father Deceased   . Sister Alive   . Sister Alive      There is no immunization history on file for this patient.  Allergies  Allergen Reactions  . Hydrocodone Nausea Only    "bloated"  . Codeine Rash    Medications: Patient's Medications  New Prescriptions   No medications on file  Previous Medications   AMLODIPINE (NORVASC) 5 MG TABLET    Take 5 mg by mouth daily.   COLCHICINE 0.6 MG CAPS     Take 1 tablet by mouth daily.   LEVOTHYROXINE (SYNTHROID, LEVOTHROID) 100 MCG TABLET    Take 100 mcg by mouth daily before breakfast.   LISINOPRIL-HYDROCHLOROTHIAZIDE (PRINZIDE,ZESTORETIC) 20-12.5 MG PER TABLET    Take 1 tablet by mouth daily.   PIOGLITAZONE (ACTOS) 30 MG TABLET    Take 30 mg by mouth daily.   TAMOXIFEN (NOLVADEX) 20 MG TABLET    Take 1 tablet (20 mg total) by mouth daily.  Modified Medications   No medications on file  Discontinued Medications   LISINOPRIL-HYDROCHLOROTHIAZIDE (PRINZIDE,ZESTORETIC) 10-12.5 MG PER TABLET    Take 1 tablet by mouth daily.    Review of Systems  Constitutional: Positive for chills and diaphoresis (due to tamoxifen). Negative for fever, activity change, appetite change and fatigue.  HENT: Positive for hearing loss, sinus pressure and trouble swallowing. Negative for ear pain and sore throat.   Eyes: Negative for visual disturbance.       Dry eyes  Respiratory: Positive for cough. Negative for chest tightness and shortness of breath.   Cardiovascular: Negative for chest pain, palpitations and leg swelling.  Gastrointestinal: Negative for nausea, vomiting, abdominal pain, diarrhea, constipation and blood in stool.       Hiatal hernia; flatulence  Endocrine: Positive for cold intolerance.  Genitourinary: Positive for frequency (with urgency). Negative for dysuria.  Musculoskeletal: Positive for joint swelling and arthralgias.  Neurological: Negative for dizziness, tremors, numbness and headaches.  Hematological:       Anemia hx  Psychiatric/Behavioral: Negative for sleep disturbance. The patient is not nervous/anxious.     Filed Vitals:   03/09/15 0842  BP: 120/68  Pulse: 82  Temp: 97.8 F (36.6 C)  TempSrc: Oral  Resp: 20  Height: '5\' 2"'  (1.575 m)  Weight: 268 lb (121.564 kg)  SpO2: 98%   Body mass index is 49.01 kg/(m^2).  Physical Exam  Constitutional: She is oriented to person, place, and time. She appears well-developed and  well-nourished.  HENT:  Mouth/Throat: Oropharynx is clear and moist. No oropharyngeal exudate.  Eyes: Pupils are equal, round, and reactive to light. No scleral icterus.  Neck: Neck supple. No tracheal deviation present. Thyromegaly (right but no distinct nodules palpable) present.  Cardiovascular: Normal rate, regular rhythm, normal heart sounds and intact distal pulses.  Exam reveals no gallop and no friction rub.   No murmur heard. +1 pitting LE edema b/l. no calf TTP. No carotid bruit b/l  Pulmonary/Chest: Effort normal and  breath sounds normal. No stridor. No respiratory distress. She has no wheezes. She has no rales.  Abdominal: Soft. Bowel sounds are normal. She exhibits no distension and no mass. There is no tenderness. There is no rebound and no guarding.  No hepatomegaly  Musculoskeletal: She exhibits edema and tenderness.       Left hip: She exhibits normal range of motion, normal strength, no tenderness, no swelling, no crepitus and no deformity.       Legs: Gait antalgic; b/l bunions with MTP joint swelling at 2-3 toes; ankle swelling with reduced ROM  Lymphadenopathy:    She has no cervical adenopathy.  Neurological: She is alert and oriented to person, place, and time. She has normal reflexes.  Monofilament testing intact b/l  Skin: Skin is warm and dry. No rash noted.     Psychiatric: She has a normal mood and affect. Her behavior is normal. Judgment and thought content normal.     Labs reviewed: Appointment on 02/07/2015  Component Date Value Ref Range Status  . WBC 02/07/2015 3.9  3.9 - 10.3 10e3/uL Final  . NEUT# 02/07/2015 2.1  1.5 - 6.5 10e3/uL Final  . HGB 02/07/2015 11.1* 11.6 - 15.9 g/dL Final  . HCT 02/07/2015 34.7* 34.8 - 46.6 % Final  . Platelets 02/07/2015 162  145 - 400 10e3/uL Final  . MCV 02/07/2015 88.5  79.5 - 101.0 fL Final  . MCH 02/07/2015 28.4  25.1 - 34.0 pg Final  . MCHC 02/07/2015 32.0  31.5 - 36.0 g/dL Final  . RBC 02/07/2015 3.93  3.70 -  5.45 10e6/uL Final  . RDW 02/07/2015 14.4  11.2 - 14.5 % Final  . lymph# 02/07/2015 1.1  0.9 - 3.3 10e3/uL Final  . MONO# 02/07/2015 0.5  0.1 - 0.9 10e3/uL Final  . Eosinophils Absolute 02/07/2015 0.2  0.0 - 0.5 10e3/uL Final  . Basophils Absolute 02/07/2015 0.0  0.0 - 0.1 10e3/uL Final  . NEUT% 02/07/2015 54.2  38.4 - 76.8 % Final  . LYMPH% 02/07/2015 28.2  14.0 - 49.7 % Final  . MONO% 02/07/2015 12.8  0.0 - 14.0 % Final  . EOS% 02/07/2015 4.1  0.0 - 7.0 % Final  . BASO% 02/07/2015 0.7  0.0 - 2.0 % Final  . Sodium 02/07/2015 143  136 - 145 mEq/L Final  . Potassium 02/07/2015 4.6  3.5 - 5.1 mEq/L Final  . Chloride 02/07/2015 109  98 - 109 mEq/L Final  . CO2 02/07/2015 22  22 - 29 mEq/L Final  . Glucose 02/07/2015 103  70 - 140 mg/dl Final  . BUN 02/07/2015 21.3  7.0 - 26.0 mg/dL Final  . Creatinine 02/07/2015 1.1  0.6 - 1.1 mg/dL Final  . Total Bilirubin 02/07/2015 0.34  0.20 - 1.20 mg/dL Final  . Alkaline Phosphatase 02/07/2015 53  40 - 150 U/L Final  . AST 02/07/2015 17  5 - 34 U/L Final  . ALT 02/07/2015 11  0 - 55 U/L Final  . Total Protein 02/07/2015 6.6  6.4 - 8.3 g/dL Final  . Albumin 02/07/2015 3.3* 3.5 - 5.0 g/dL Final  . Calcium 02/07/2015 9.0  8.4 - 10.4 mg/dL Final  . Anion Gap 02/07/2015 11  3 - 11 mEq/L Final  . EGFR 02/07/2015 54* >90 ml/min/1.73 m2 Final   eGFR is calculated using the CKD-EPI Creatinine Equation (2009)    No results found.   Assessment/Plan      ICD-9-CM ICD-10-CM   1. Hip bursitis, left 726.5 M70.72 Ambulatory  referral to Orthopedic Surgery  2. Primary osteoarthritis involving multiple joints 715.09 M15.0   3. Hypothyroidism due to acquired atrophy of thyroid 244.8 E03.8 TSH   246.8 E03.4 T4, Free     US Soft Tissue Head/Neck  4. Goiter 240.9 E04.9 US Soft Tissue Head/Neck  5. Essential hypertension, benign 401.1 I10   6. Type 2 diabetes mellitus without complication 276.14 J09.2 Hemoglobin A1c  7. Idiopathic gout, unspecified chronicity,  unspecified site 274.9 M10.00 Uric Acid  8. Lipoma 214.9 D17.9    ACW wall at sternoclavicular notch   --Continue current medications as ordered  --Will call with lab results, Orthopedic and thyroid Ultrasound appt  --Follow up in 3 mos  --Keep appointments with Oncology as scheduled  Karla Willis  [facility] [address] [place], [place] [address] [phone] Cell (Monday-Friday 8 AM - 5 PM) [phone] After 5 PM and follow prompts

## 2015-03-10 LAB — HEMOGLOBIN A1C
Est. average glucose Bld gHb Est-mCnc: 134 mg/dL
Hgb A1c MFr Bld: 6.3 % — ABNORMAL HIGH (ref 4.8–5.6)

## 2015-03-10 LAB — TSH: TSH: 0.714 u[IU]/mL (ref 0.450–4.500)

## 2015-03-10 LAB — URIC ACID: Uric Acid: 7.6 mg/dL — ABNORMAL HIGH (ref 2.5–7.1)

## 2015-03-10 LAB — T4, FREE: Free T4: 1.34 ng/dL (ref 0.82–1.77)

## 2015-03-12 ENCOUNTER — Telehealth: Payer: Self-pay

## 2015-03-12 MED ORDER — ALLOPURINOL 100 MG PO TABS: 100.0000 mg | ORAL_TABLET | Freq: Every day | ORAL | Status: DC

## 2015-03-12 NOTE — Telephone Encounter (Signed)
Discussed results with patient:  Uric acid elevated s/o gout - recommend gout prevention medication. Start allopurinol 100mg  #30 take 1 po daily with 4 RF; Thyroid stable on current tx; Diabetes controlled on current tx; F/u as scheduled  RX sent to pharmacy, copy of labs mailed to patient.

## 2015-03-13 ENCOUNTER — Ambulatory Visit
Admission: RE | Admit: 2015-03-13 | Discharge: 2015-03-13 | Disposition: A | Discharge status: Home / Self Care | Payer: Medicare Other | Source: Ambulatory Visit | Attending: Internal Medicine | Admitting: Internal Medicine

## 2015-03-13 DIAGNOSIS — E042 Nontoxic multinodular goiter: Secondary | ICD-10-CM | POA: Diagnosis not present

## 2015-03-13 DIAGNOSIS — E049 Nontoxic goiter, unspecified: Secondary | ICD-10-CM

## 2015-03-13 DIAGNOSIS — E034 Atrophy of thyroid (acquired): Secondary | ICD-10-CM

## 2015-03-15 NOTE — Telephone Encounter (Signed)
Opened in error

## 2015-03-16 ENCOUNTER — Other Ambulatory Visit: Payer: Self-pay

## 2015-03-16 DIAGNOSIS — E042 Nontoxic multinodular goiter: Secondary | ICD-10-CM

## 2015-03-21 DIAGNOSIS — M545 Low back pain: Secondary | ICD-10-CM | POA: Diagnosis not present

## 2015-03-21 DIAGNOSIS — M25552 Pain in left hip: Secondary | ICD-10-CM | POA: Diagnosis not present

## 2015-03-23 ENCOUNTER — Other Ambulatory Visit: Payer: Self-pay | Admitting: *Deleted

## 2015-03-23 MED ORDER — PIOGLITAZONE HCL 30 MG PO TABS: 30.0000 mg | ORAL_TABLET | Freq: Every day | ORAL | Status: DC

## 2015-03-23 NOTE — Telephone Encounter (Signed)
Patient requested to be sent to pharmacy.  

## 2015-03-27 DIAGNOSIS — E042 Nontoxic multinodular goiter: Secondary | ICD-10-CM | POA: Diagnosis not present

## 2015-03-27 DIAGNOSIS — K219 Gastro-esophageal reflux disease without esophagitis: Secondary | ICD-10-CM | POA: Diagnosis not present

## 2015-03-27 DIAGNOSIS — R1310 Dysphagia, unspecified: Secondary | ICD-10-CM | POA: Diagnosis not present

## 2015-03-28 ENCOUNTER — Other Ambulatory Visit: Payer: Self-pay | Admitting: Otolaryngology

## 2015-03-28 DIAGNOSIS — R1310 Dysphagia, unspecified: Secondary | ICD-10-CM

## 2015-03-28 DIAGNOSIS — E041 Nontoxic single thyroid nodule: Secondary | ICD-10-CM

## 2015-04-11 ENCOUNTER — Other Ambulatory Visit: Payer: Medicare Other

## 2015-04-11 DIAGNOSIS — M545 Low back pain: Secondary | ICD-10-CM | POA: Diagnosis not present

## 2015-04-11 DIAGNOSIS — M25552 Pain in left hip: Secondary | ICD-10-CM | POA: Diagnosis not present

## 2015-04-18 ENCOUNTER — Ambulatory Visit
Admission: RE | Admit: 2015-04-18 | Discharge: 2015-04-18 | Disposition: A | Discharge status: Home / Self Care | Payer: Medicare Other | Source: Ambulatory Visit | Attending: Otolaryngology | Admitting: Otolaryngology

## 2015-04-18 ENCOUNTER — Other Ambulatory Visit (HOSPITAL_COMMUNITY)
Admission: RE | Admit: 2015-04-18 | Discharge: 2015-04-18 | Disposition: A | Discharge status: Home / Self Care | Payer: Medicare Other | Source: Ambulatory Visit | Attending: Interventional Radiology | Admitting: Interventional Radiology

## 2015-04-18 DIAGNOSIS — E041 Nontoxic single thyroid nodule: Secondary | ICD-10-CM

## 2015-05-02 ENCOUNTER — Ambulatory Visit
Admission: RE | Admit: 2015-05-02 | Discharge: 2015-05-02 | Disposition: A | Discharge status: Home / Self Care | Payer: Medicare Other | Source: Ambulatory Visit | Attending: Otolaryngology | Admitting: Otolaryngology

## 2015-05-02 DIAGNOSIS — R1310 Dysphagia, unspecified: Secondary | ICD-10-CM

## 2015-05-02 DIAGNOSIS — K219 Gastro-esophageal reflux disease without esophagitis: Secondary | ICD-10-CM | POA: Diagnosis not present

## 2015-05-05 ENCOUNTER — Other Ambulatory Visit: Payer: Self-pay | Admitting: Hematology and Oncology

## 2015-05-07 ENCOUNTER — Other Ambulatory Visit: Payer: Self-pay

## 2015-05-07 NOTE — Telephone Encounter (Signed)
lst ov 3/300/16.  Next ov 08/14/15.  Chart reviewed

## 2015-05-09 ENCOUNTER — Other Ambulatory Visit: Payer: Self-pay | Admitting: *Deleted

## 2015-05-09 MED ORDER — LISINOPRIL-HYDROCHLOROTHIAZIDE 20-12.5 MG PO TABS: ORAL_TABLET | ORAL | Status: DC

## 2015-05-09 NOTE — Telephone Encounter (Signed)
Patient requested and faxed to pharmacy 

## 2015-05-22 DIAGNOSIS — R933 Abnormal findings on diagnostic imaging of other parts of digestive tract: Secondary | ICD-10-CM | POA: Diagnosis not present

## 2015-05-22 DIAGNOSIS — K219 Gastro-esophageal reflux disease without esophagitis: Secondary | ICD-10-CM | POA: Diagnosis not present

## 2015-05-22 DIAGNOSIS — R1310 Dysphagia, unspecified: Secondary | ICD-10-CM | POA: Diagnosis not present

## 2015-05-30 DIAGNOSIS — R1310 Dysphagia, unspecified: Secondary | ICD-10-CM | POA: Diagnosis not present

## 2015-05-30 DIAGNOSIS — R933 Abnormal findings on diagnostic imaging of other parts of digestive tract: Secondary | ICD-10-CM | POA: Diagnosis not present

## 2015-05-30 DIAGNOSIS — K219 Gastro-esophageal reflux disease without esophagitis: Secondary | ICD-10-CM | POA: Diagnosis not present

## 2015-06-04 DIAGNOSIS — Z1211 Encounter for screening for malignant neoplasm of colon: Secondary | ICD-10-CM | POA: Diagnosis not present

## 2015-06-04 DIAGNOSIS — Z1212 Encounter for screening for malignant neoplasm of rectum: Secondary | ICD-10-CM | POA: Diagnosis not present

## 2015-06-08 ENCOUNTER — Ambulatory Visit (INDEPENDENT_AMBULATORY_CARE_PROVIDER_SITE_OTHER): Payer: Medicare Other | Admitting: Internal Medicine

## 2015-06-08 ENCOUNTER — Encounter: Payer: Self-pay | Admitting: Internal Medicine

## 2015-06-08 VITALS — BP 108/52 | HR 87 | Temp 98.1°F | Resp 20 | Ht 62.0 in | Wt 268.8 lb

## 2015-06-08 DIAGNOSIS — M150 Primary generalized (osteo)arthritis: Secondary | ICD-10-CM

## 2015-06-08 DIAGNOSIS — M159 Polyosteoarthritis, unspecified: Secondary | ICD-10-CM

## 2015-06-08 DIAGNOSIS — E038 Other specified hypothyroidism: Secondary | ICD-10-CM | POA: Diagnosis not present

## 2015-06-08 DIAGNOSIS — M7662 Achilles tendinitis, left leg: Secondary | ICD-10-CM | POA: Diagnosis not present

## 2015-06-08 DIAGNOSIS — M7072 Other bursitis of hip, left hip: Secondary | ICD-10-CM

## 2015-06-08 DIAGNOSIS — R1310 Dysphagia, unspecified: Secondary | ICD-10-CM | POA: Diagnosis not present

## 2015-06-08 DIAGNOSIS — E049 Nontoxic goiter, unspecified: Secondary | ICD-10-CM

## 2015-06-08 DIAGNOSIS — E034 Atrophy of thyroid (acquired): Secondary | ICD-10-CM | POA: Diagnosis not present

## 2015-06-08 DIAGNOSIS — E119 Type 2 diabetes mellitus without complications: Secondary | ICD-10-CM | POA: Diagnosis not present

## 2015-06-08 DIAGNOSIS — M1000 Idiopathic gout, unspecified site: Secondary | ICD-10-CM

## 2015-06-08 DIAGNOSIS — I10 Essential (primary) hypertension: Secondary | ICD-10-CM | POA: Diagnosis not present

## 2015-06-08 DIAGNOSIS — M8949 Other hypertrophic osteoarthropathy, multiple sites: Secondary | ICD-10-CM

## 2015-06-08 MED ORDER — AMLODIPINE BESYLATE 5 MG PO TABS: 5.0000 mg | ORAL_TABLET | Freq: Every day | ORAL | Status: DC

## 2015-06-08 MED ORDER — TRAMADOL HCL 50 MG PO TABS: 50.0000 mg | ORAL_TABLET | Freq: Three times a day (TID) | ORAL | Status: DC | PRN

## 2015-06-08 NOTE — Progress Notes (Signed)
Patient ID: Karla Willis, female   DOB: May 18, 1935, 80 y.o.   MRN: [id number]    Location:    [facility]   Place of Service:  OFFICE   Chief Complaint  Patient presents with  . Medical Management of Chronic Issues    3 month follow-up, having pain in left foot and ankle    HPI:  80 yo female seen today for f/u. She c/o left foot pain x 5 days. She has chronic swelling in b/l LE. She is unable to wear shoes due to pain and swelling. No known trauma. No numbness or tingling. No relief with Tylenol.  No other concerns. No falls  BP controlled on lisinopril hct and amlodipine. Needs med Rf for amlodipine  She had a thyroid US and bx since her last OV. She plans to have it re-biopsied in Dec 2016 as last results non diagnostic. She takes levothyroxine  She has DM and takes Actos. BS 90-100s. No low BS reactions.  She saw GI Dr Collene Mares for choking sensation with eating. Had barium swallow ordered by ENT which showed a stricture. EGD revealed a "pulled muscle" but no stricture. No meds Rx. No further w/u  No gout attacks since her last visit. She is taking allopurinol for prevention.   Past Medical History  Diagnosis Date  . Allergy   . Cancer   . Diabetes mellitus without complication   . Thyroid disease   . Hypertension   . Wears glasses   . HOH (hard of hearing)   . Arthritis   . Gout   . 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 Gallbladder sugery    . Thyroid surgery  1970  . Cholecystectomy  1970  . Tubal ligation    . Finger surgery      right hand  . Shoulder arthroscopy  2011    left  . Colonoscopy    . Breast lumpectomy with needle localization Right 12/16/2013    Procedure: BREAST LUMPECTOMY WITH NEEDLE LOCALIZATION;  Surgeon: Edward Jolly, MD;  Location: [facility];  Service: General;  Laterality: Right;  . Breast biopsy Right     remote, benign  . Re-excision of breast  lumpectomy Right 01/30/2014    Procedure: RE-EXCISION OF BREAST LUMPECTOMY;  Surgeon: Edward Jolly, MD;  Location: [facility];  Service: General;  Laterality: Right;  . Re-excision of breast lumpectomy Right 03/06/2014    Procedure: RE-EXCISION OF BREAST LUMPECTOMY;  Surgeon: Edward Jolly, MD;  Location: [facility];  Service: General;  Laterality: Right;    Patient Care Team: Gildardo Cranker, DO as PCP - General (Internal Medicine) Nicholas Lose, MD as Consulting Physician (Hematology and Oncology)  History   Social History  . Marital Status: Widowed    Spouse Name: N/A  . Number of Children: N/A  . Years of Education: N/A   Occupational History  . Not on file.   Social History Main Topics  . Smoking status: Never Smoker   . Smokeless tobacco: Never Used  . Alcohol Use: No  . Drug Use: No  . Sexual Activity: Not Currently     Comment: menarche age 59, G68, P77, first live birth age 18, menopause at 7, no HRT   Other Topics Concern  . Not on file   Social History Narrative   Diet: No      Do you  drink/ eat things with caffeine? Yes      Marital status:    Widowed                           What year were you married ?  1956      Do you live in a house, apartment,assistred living, condo, trailer, etc.)?  apartment      Is it one or more stories?  one      How many persons live in your home ?  3      Do you have any pets in your home ?(please list)  No      Current or past profession:  Bank, Caregiver      Do you exercise?  No                            Type & how often:        Do you have a living will?  Yes      Do you have a DNR form?  Yes                     If not, do you want to discuss one?       Do you have signed POA?HPOA forms?                 If so, please bring to your        appointment           reports that she has never smoked. She has never used smokeless tobacco. She reports that she does not drink  alcohol or use illicit drugs.  Allergies  Allergen Reactions  . Hydrocodone Nausea Only    "bloated"  . Other     Nuts ( Pecans,Peanuts)  . Codeine Rash    Medications: Patient's Medications  New Prescriptions   No medications on file  Previous Medications   ALLOPURINOL (ZYLOPRIM) 100 MG TABLET    Take 1 tablet (100 mg total) by mouth daily. For Gout Prevention   AMLODIPINE (NORVASC) 5 MG TABLET    Take 5 mg by mouth daily.   COLCHICINE 0.6 MG CAPS    Take 1 tablet by mouth daily.   LEVOTHYROXINE (SYNTHROID, LEVOTHROID) 100 MCG TABLET    Take 100 mcg by mouth daily before breakfast.   LISINOPRIL-HYDROCHLOROTHIAZIDE (PRINZIDE,ZESTORETIC) 20-12.5 MG PER TABLET    Take one tablet by mouth once daily for blood pressure   PIOGLITAZONE (ACTOS) 30 MG TABLET    Take 1 tablet (30 mg total) by mouth daily.   TAMOXIFEN (NOLVADEX) 20 MG TABLET    take 1 tablet by mouth once daily  Modified Medications   No medications on file  Discontinued Medications   No medications on file    Review of Systems  Constitutional: Negative for fever, chills, diaphoresis, activity change, appetite change and fatigue.  HENT: Positive for trouble swallowing. Negative for ear pain and sore throat.   Eyes: Negative for visual disturbance.  Respiratory: Positive for choking. Negative for cough, chest tightness and shortness of breath.   Cardiovascular: Positive for leg swelling. Negative for chest pain and palpitations.  Gastrointestinal: Negative for nausea, vomiting, abdominal pain, diarrhea, constipation and blood in stool.  Genitourinary: Negative for dysuria.  Musculoskeletal: Positive for joint swelling and arthralgias.  Neurological: Negative for dizziness, tremors, numbness and headaches.  Psychiatric/Behavioral: Negative for sleep  disturbance. The patient is not nervous/anxious.     Filed Vitals:   06/08/15 1151  BP: 108/52  Pulse: 87  Temp: 98.1 F (36.7 C)  TempSrc: Oral  Resp: 20  Height:  5\' 2"  (1.575 m)  Weight: 268 lb 12.8 oz (121.927 kg)  SpO2: 96%   Body mass index is 49.15 kg/(m^2).  Physical Exam  Constitutional: She is oriented to person, place, and time. She appears well-developed and well-nourished.  HENT:  Mouth/Throat: Oropharynx is clear and moist. No oropharyngeal exudate.  Eyes: Pupils are equal, round, and reactive to light. No scleral icterus.  Neck: Neck supple. Carotid bruit is present (R>L). No tracheal deviation present. No thyromegaly present.  Cardiovascular: Normal rate, regular rhythm, normal heart sounds and intact distal pulses.  Exam reveals no gallop and no friction rub.   No murmur heard. L>R +1 pitting LE edema b/l. no calf TTP.   Pulmonary/Chest: Effort normal and breath sounds normal. No stridor. No respiratory distress. She has no wheezes. She has no rales.  Abdominal: Soft. Bowel sounds are normal. She exhibits no distension and no mass. There is no hepatomegaly. There is no tenderness. There is no rebound and no guarding.  Musculoskeletal: She exhibits edema and tenderness (left achiles tendon swelling with reduced plantar and dorsiflexion of foot).  Lymphadenopathy:    She has no cervical adenopathy.  Neurological: She is alert and oriented to person, place, and time.  Skin: Skin is warm and dry. No rash noted.  Suprasternal notch freely mobile lipoma - unchanged in size  Psychiatric: She has a normal mood and affect. Her behavior is normal. Thought content normal.    Labs reviewed: No visits with results within 3 Month(s) from this visit. Latest known visit with results is:  Office Visit on 03/09/2015  Component Date Value Ref Range Status  . TSH 03/09/2015 0.714  0.450 - 4.500 uIU/mL Final  . Free T4 03/09/2015 1.34  0.82 - 1.77 ng/dL Final  . Uric Acid 03/09/2015 7.6* 2.5 - 7.1 mg/dL Final              Therapeutic target for gout patients: <6.0  . Hgb A1c MFr Bld 03/09/2015 6.3* 4.8 - 5.6 % Final   Comment:           Pre-diabetes: 5.7 - 6.4          Diabetes: >6.4          Glycemic control for adults with diabetes: <7.0   . Est. average glucose Bld gHb Est-m* 03/09/2015 134   Final    No results found.   Assessment/Plan   ICD-9-CM ICD-10-CM   1. Tendonitis, Achilles, left 726.71 M76.62 traMADol (ULTRAM) 50 MG tablet     Ambulatory referral to Podiatry  2. Goiter - stable thyroid labs 240.9 E04.9   3. Essential hypertension, benign - controlled 401.1 I10   4. Hypothyroidism due to acquired atrophy of thyroid - stable 244.8 E03.8    246.8 E03.4   5. Primary osteoarthritis involving multiple joints - stable 715.09 M15.0 traMADol (ULTRAM) 50 MG tablet  6. Type 2 diabetes mellitus without complication - controlled 250.00 E11.9   7. Idiopathic gout, unspecified chronicity, unspecified site - stable 274.9 M10.00   8. Hip bursitis, left - stable 726.5 M70.72 traMADol (ULTRAM) 50 MG tablet  9. Dysphagia - failing to change as expected 787.20 R13.10      --Take tramadol as needed for pain with food  --Continue other medications as ordered  --Follow  up with specialists as scheduled  --Follow up in 3 mos for routine visit (fasting labs 2-3 days prior to appt)  Cordella Register. Perlie Gold  [facility] [address] [place], [place] [address] [phone] Cell (Monday-Friday 8 AM - 5 PM) [phone] After 5 PM and follow prompts

## 2015-06-08 NOTE — Patient Instructions (Addendum)
Take tramadol as needed for pain with food  Continue other medications as ordered  Follow up with specialists as scheduled  Follow up in 3 mos for routine visit (fasting labs 2-3 days prior to appt)

## 2015-06-19 ENCOUNTER — Ambulatory Visit: Payer: Medicare Other | Admitting: Podiatry

## 2015-07-08 IMAGING — US US THYROID BIOPSY
1 series · 14 of 17 positions shown · non-contrast
Comparison: none

CLINICAL DATA: Previous left hemithyroidectomy for benign disease.
Dominant complex right nodule

EXAM:
ULTRASOUND-GUIDED THYROID ASPIRATION BIOPSY
TECHNIQUE: The procedure, risks (including but not limited to bleeding,
infection, organ damage ), benefits, and alternatives were explained
to the patient. Questions regarding the procedure were encouraged
and answered. The patient understands and consents to the procedure.

[Series 1: us thyroid biopsy · 0.08mm/px · 17 acquisitions, 14 frames shown]
[im 1/17]
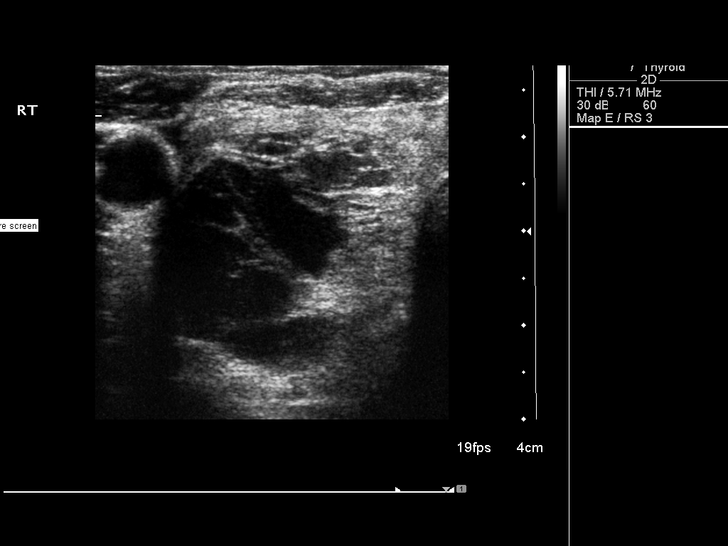
[im 2/17]
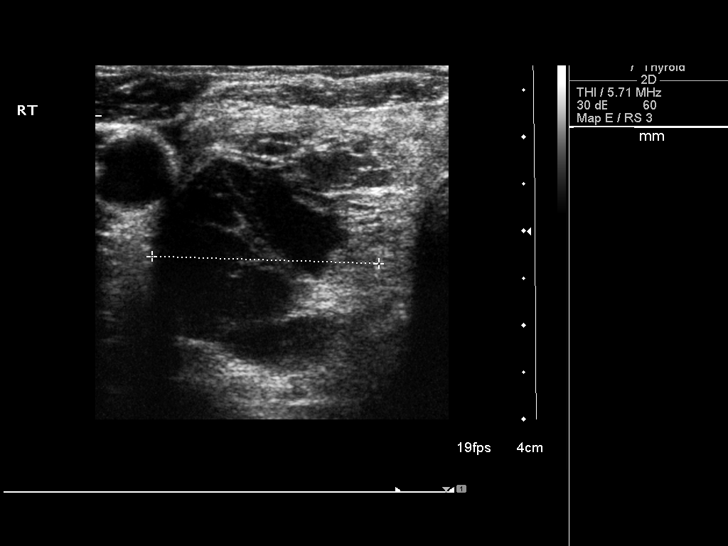
[im 4/17]
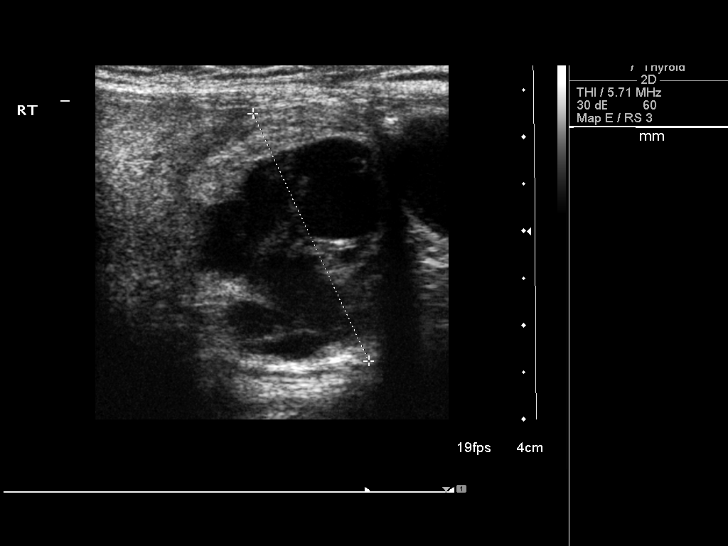
[im 5/17]
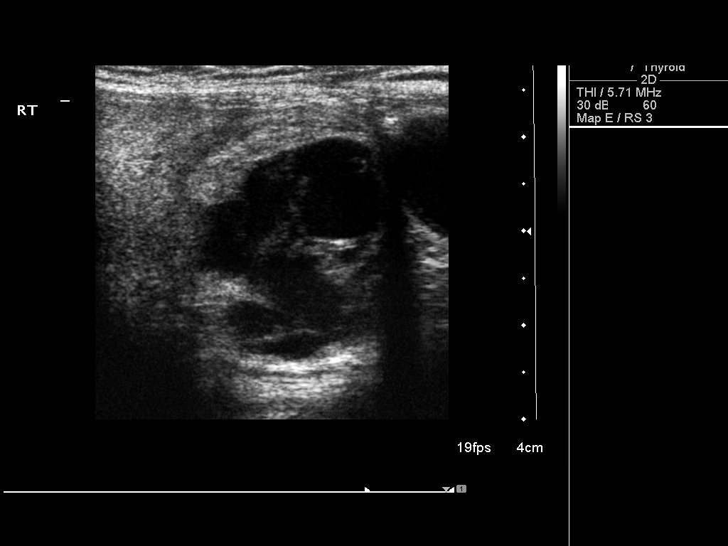
[im 6/17]
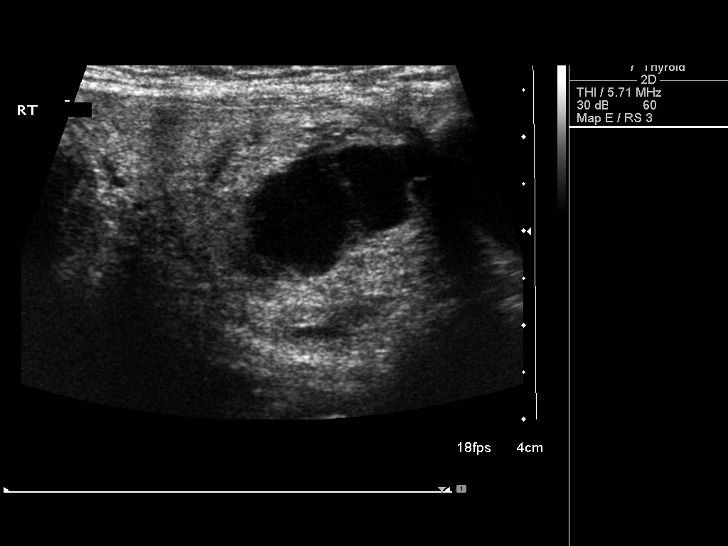
[im 7/17]
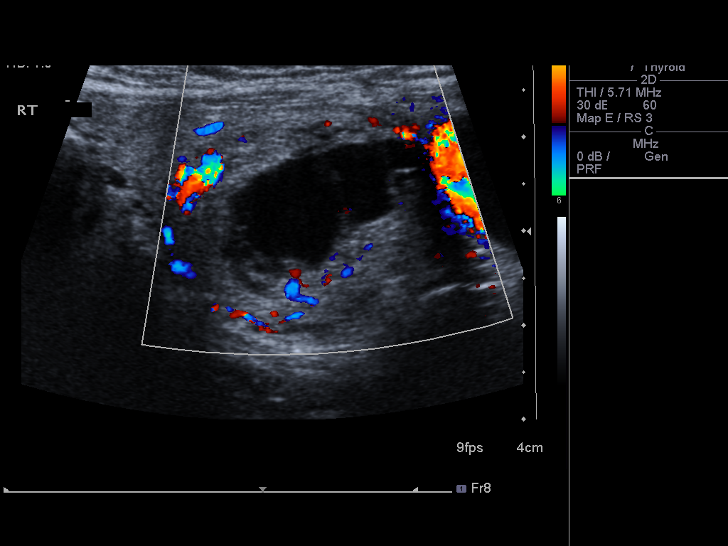
[im 8/17]
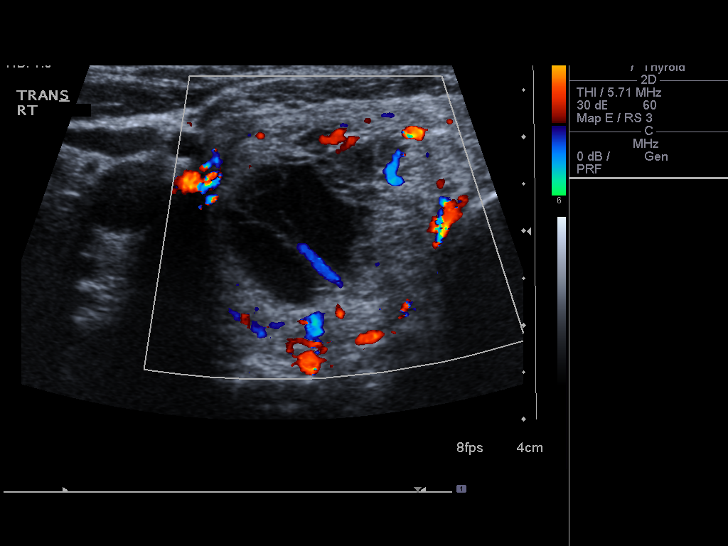
[im 10/17]
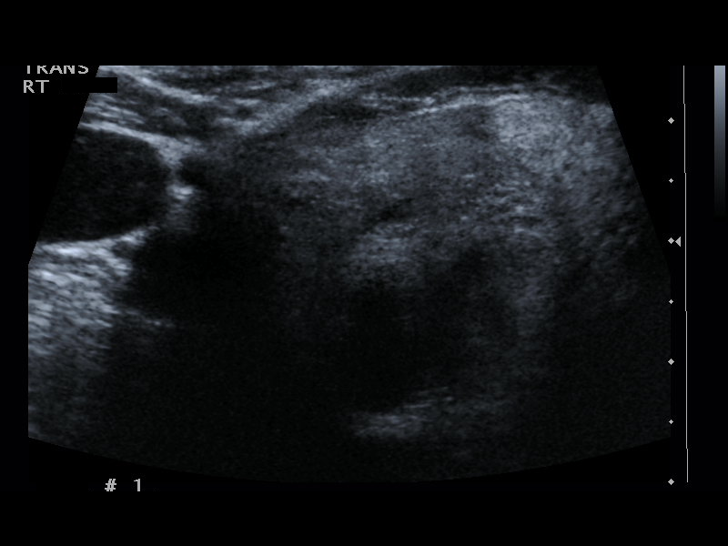
[im 11/17]
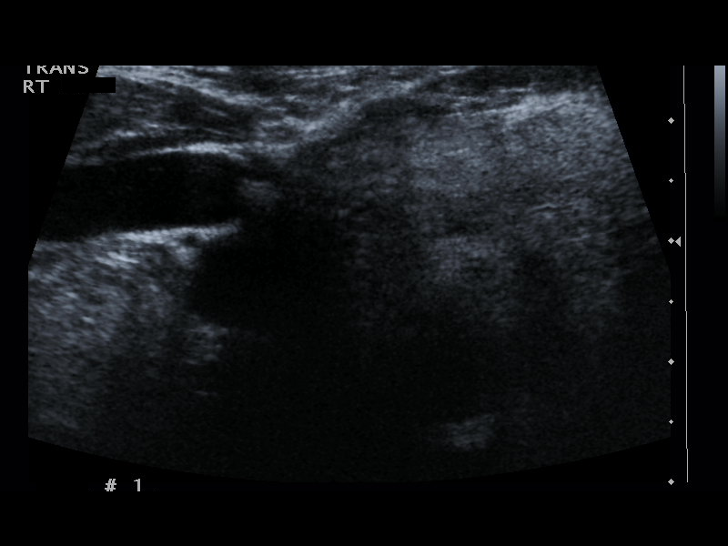
[im 12/17]
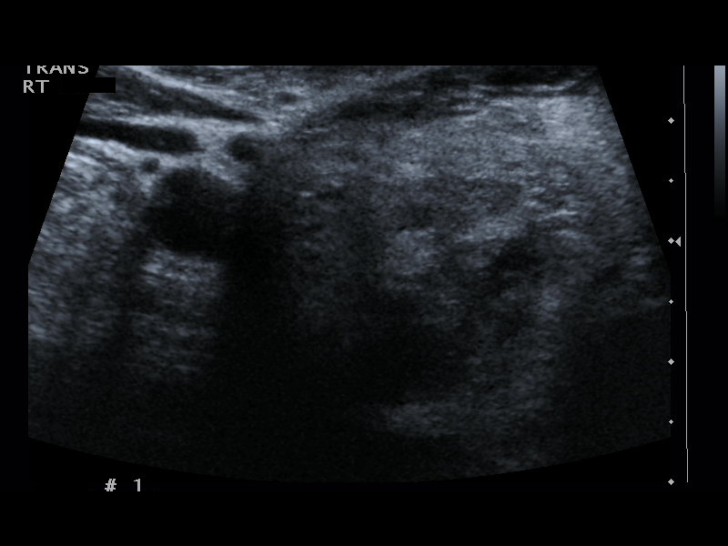
[im 13/17]
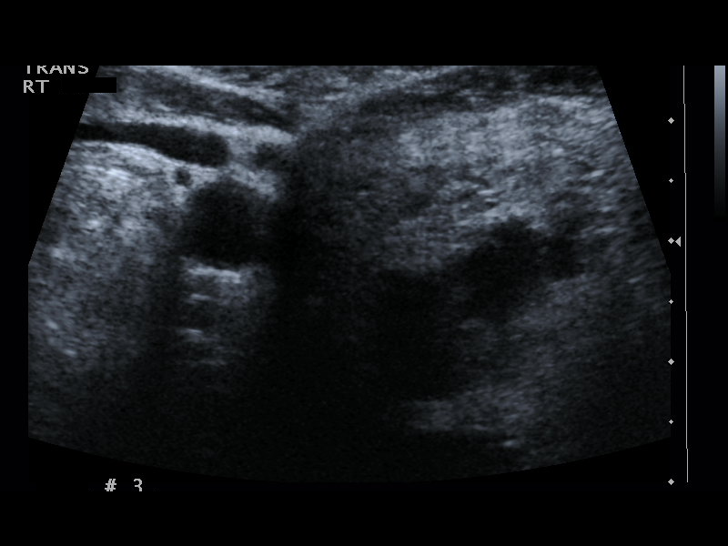
[im 14/17]
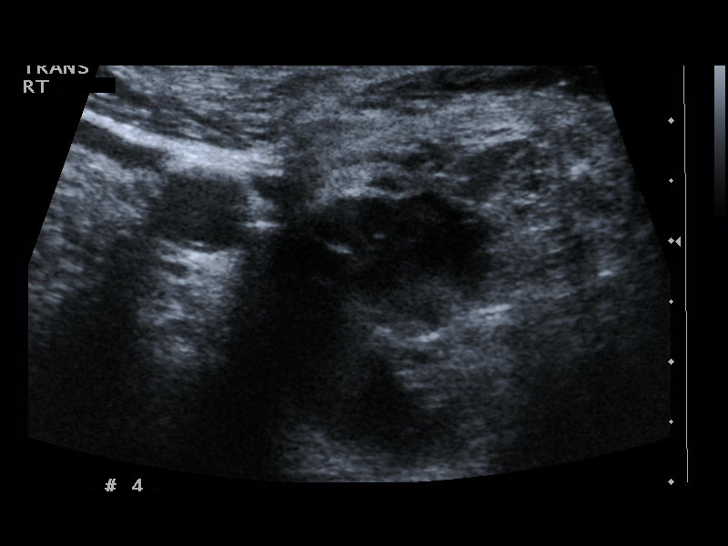
[im 16/17]
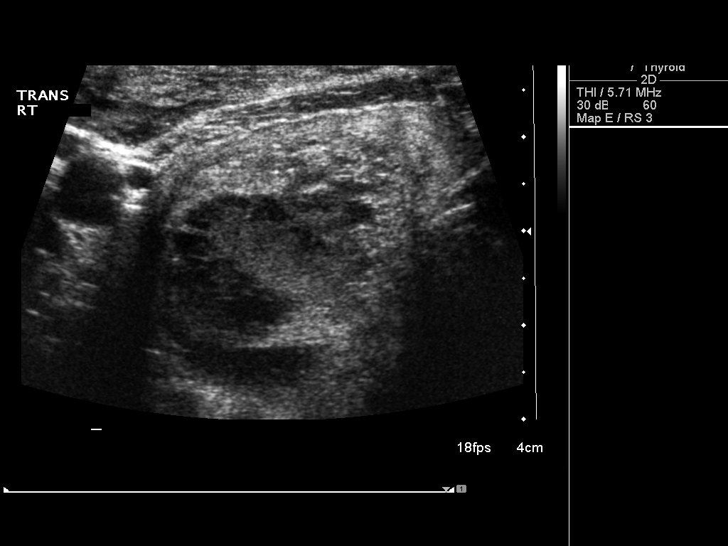
[im 17/17]
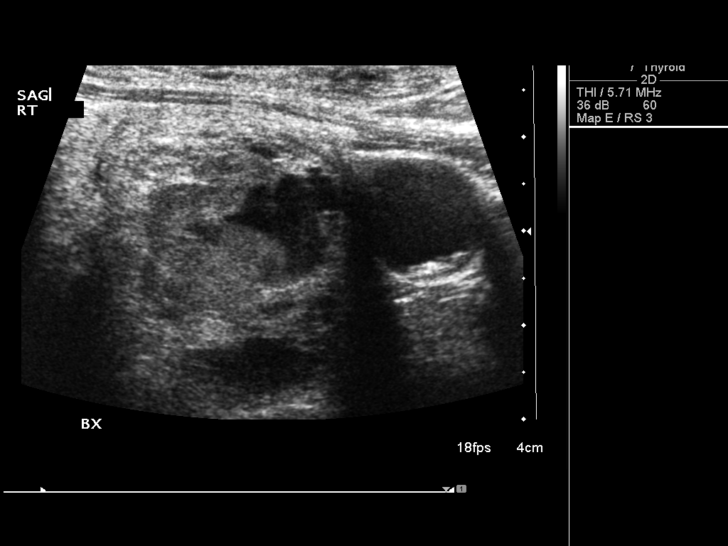

[14 of 17 positions shown; findings below may reference images not displayed]

Survey ultrasound was performed and the dominant lesion in the
inferior right lobe was localized. An appropriate skin entry site
was determined. Skin was marked, then prepped with Betadine, draped
in usual sterile fashion, and infiltrated locally with 1% lidocaine.
Under real-time ultrasound guidance, 4 passes were made into the
lesion with 25 gauge needles, sampling solid and cystic components.
The patient tolerated procedure well.

COMPLICATIONS:
COMPLICATIONS
none
IMPRESSION: 1. Technically successful ultrasound-guided thyroid aspiration
biopsy dominant inferior right complex nodule.

## 2015-07-14 ENCOUNTER — Other Ambulatory Visit: Payer: Self-pay | Admitting: Internal Medicine

## 2015-07-19 ENCOUNTER — Other Ambulatory Visit: Payer: Self-pay | Admitting: *Deleted

## 2015-07-19 MED ORDER — LEVOTHYROXINE SODIUM 100 MCG PO TABS: 100.0000 ug | ORAL_TABLET | Freq: Every day | ORAL | Status: DC

## 2015-07-19 NOTE — Telephone Encounter (Signed)
Patient requested to be faxed to pharmacy 

## 2015-08-13 NOTE — Assessment & Plan Note (Signed)
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 Patient did not undergo radiation therapy he 100% PR 60% positive. She was started on tamoxifen in April 2015.  Tamoxifen toxicities: 1. Hot flashes occasional  Breast cancer surveillance: 1. Mammograms November 2015 were normal 2. Breast exam 08/14/2015 is normal  Left leg pain:   Return to clinic in 6 months for follow-up

## 2015-08-14 ENCOUNTER — Encounter: Payer: Self-pay | Admitting: Hematology and Oncology

## 2015-08-14 ENCOUNTER — Telehealth: Payer: Self-pay | Admitting: Hematology and Oncology

## 2015-08-14 ENCOUNTER — Ambulatory Visit (HOSPITAL_BASED_OUTPATIENT_CLINIC_OR_DEPARTMENT_OTHER): Payer: Medicare Other | Admitting: Hematology and Oncology

## 2015-08-14 VITALS — BP 132/58 | HR 95 | Temp 98.4°F | Resp 19 | Ht 62.0 in | Wt 265.4 lb

## 2015-08-14 DIAGNOSIS — Z170 Estrogen receptor positive status [ER+]: Secondary | ICD-10-CM | POA: Diagnosis not present

## 2015-08-14 DIAGNOSIS — I10 Essential (primary) hypertension: Secondary | ICD-10-CM

## 2015-08-14 DIAGNOSIS — C50311 Malignant neoplasm of lower-inner quadrant of right female breast: Secondary | ICD-10-CM

## 2015-08-14 DIAGNOSIS — D0511 Intraductal carcinoma in situ of right breast: Secondary | ICD-10-CM

## 2015-08-14 NOTE — Telephone Encounter (Signed)
Appointments made and avs pritned for pateint °

## 2015-08-14 NOTE — Progress Notes (Signed)
Patient Care Team: Gildardo Cranker, DO as PCP - General (Internal Medicine) Nicholas Lose, MD as Consulting Physician (Hematology and Oncology)  DIAGNOSIS: DCIS (ductal carcinoma in situ) of breast   Staging form: Breast, AJCC 7th Edition     Clinical: Stage 0 (Tis, N0, cM0) - Unsigned     Pathologic: No stage assigned - Unsigned   SUMMARY OF ONCOLOGIC HISTORY:   Breast cancer of lower-inner quadrant of right female breast ([place])   10/19/2013 Initial Diagnosis DCIS ER/PR positive low grade   12/16/2013 Surgery Lumpectomy right breast: DCIS with calcifications low-grade 1.6 cm focally 0.1 cm to lateral margin, excision of right posterior margin continued to show DCIS that led to the third surgery for reexcision which was negative for DCIS ER 100% PR 60%   01/09/2014 -  Anti-estrogen oral therapy Tamoxifen 20 mg daily (patient not a candidate for radiation because of body habitus)    CHIEF COMPLIANT:  Follow-up of DCIS on tamoxifen  INTERVAL HISTORY: Karla Willis is a  80 year old with above-mentioned history of right breast DCIS currently on antiestrogen therapy with tamoxifen and tolerating it extremely well. Very occasional hot flashes. She denies any lumps or nodules in the breasts. She has not been doing any exercise or physical activity. Her leg swelling is same as before. Leg pain has resolved.   REVIEW OF SYSTEMS:   Constitutional: Denies fevers, chills or abnormal weight loss Eyes: Denies blurriness of vision Ears, nose, mouth, throat, and face: Denies mucositis or sore throat Respiratory: Denies cough, dyspnea or wheezes Cardiovascular: Denies palpitation, chest discomfort or lower extremity swelling Gastrointestinal:  Denies nausea, heartburn or change in bowel habits Skin: Denies abnormal skin rashes Lymphatics: Denies new lymphadenopathy or easy bruising Neurological:Denies numbness, tingling or new weaknesses Behavioral/Psych: Mood is stable, no new changes  Breast:  denies  any pain or lumps or nodules in either breasts All other systems were reviewed with the patient and are negative.  I have reviewed the past medical history, past surgical history, social history and family history with the patient and they are unchanged from previous note.  ALLERGIES:  is allergic to hydrocodone; other; and codeine.  MEDICATIONS:  Current Outpatient Prescriptions  Medication Sig Dispense Refill  . allopurinol (ZYLOPRIM) 100 MG tablet Take 1 tablet (100 mg total) by mouth daily. For Gout Prevention 30 tablet 4  . amLODipine (NORVASC) 5 MG tablet Take 1 tablet (5 mg total) by mouth daily. 306 tablet 6  . levothyroxine (SYNTHROID, LEVOTHROID) 100 MCG tablet Take 1 tablet (100 mcg total) by mouth daily before breakfast. 30 tablet 3  . lisinopril-hydrochlorothiazide (PRINZIDE,ZESTORETIC) 20-12.5 MG per tablet Take one tablet by mouth once daily for blood pressure 30 tablet 3  . pioglitazone (ACTOS) 30 MG tablet TAKE 1 TABLET DAILY 30 tablet 3  . tamoxifen (NOLVADEX) 20 MG tablet take 1 tablet by mouth once daily 90 tablet 2  . traMADol (ULTRAM) 50 MG tablet Take 1 tablet (50 mg total) by mouth every 8 (eight) hours as needed. 30 tablet 0   No current facility-administered medications for this visit.    PHYSICAL EXAMINATION: ECOG PERFORMANCE STATUS: 1 - Symptomatic but completely ambulatory  Filed Vitals:   08/14/15 1135  BP: 132/58  Pulse: 95  Temp: 98.4 F (36.9 C)  Resp: 19   Filed Weights   08/14/15 1135  Weight: 265 lb 6.4 oz (120.385 kg)    GENERAL:alert, no distress and comfortable SKIN: skin color, texture, turgor are normal, no rashes or significant  lesions EYES: normal, Conjunctiva are pink and non-injected, sclera clear OROPHARYNX:no exudate, no erythema and lips, buccal mucosa, and tongue normal  NECK: supple, thyroid normal size, non-tender, without nodularity LYMPH:  no palpable lymphadenopathy in the cervical, axillary or inguinal LUNGS: clear to  auscultation and percussion with normal breathing effort HEART: regular rate & rhythm and no murmurs and no lower extremity edema ABDOMEN:abdomen soft, non-tender and normal bowel sounds Musculoskeletal:no cyanosis of digits and no clubbing  NEURO: alert & oriented x 3 with fluent speech, no focal motor/sensory deficits BREAST: No palpable masses or nodules in either right or left breasts. No palpable axillary supraclavicular or infraclavicular adenopathy no breast tenderness or nipple discharge. (exam performed in the presence of a chaperone)  LABORATORY DATA:  I have reviewed the data as listed   Chemistry      Component Value Date/Time   NA 143 02/07/2015 1106   NA 141 03/03/2014 1501   K 4.6 02/07/2015 1106   K 5.2 03/03/2014 1501   CL 109 03/03/2014 1501   CO2 22 02/07/2015 1106   CO2 25 12/12/2013 1335   BUN 21.3 02/07/2015 1106   BUN 35* 03/03/2014 1501   CREATININE 1.1 02/07/2015 1106   CREATININE 1.40* 03/03/2014 1501      Component Value Date/Time   CALCIUM 9.0 02/07/2015 1106   CALCIUM 9.8 12/12/2013 1335   ALKPHOS 53 02/07/2015 1106   AST 17 02/07/2015 1106   ALT 11 02/07/2015 1106   BILITOT 0.34 02/07/2015 1106       Lab Results  Component Value Date   WBC 3.9 02/07/2015   HGB 11.1* 02/07/2015   HCT 34.7* 02/07/2015   MCV 88.5 02/07/2015   PLT 162 02/07/2015   NEUTROABS 2.1 02/07/2015   ASSESSMENT & PLAN:  Breast cancer of lower-inner quadrant of right female breast DCIS right breast 1.6 cm wide 3 reexcision surgeries were negative margins. Patient did not undergo radiation therapy he 100% PR 60% positive. She was started on tamoxifen in April 2015.  Tamoxifen toxicities: 1. Hot flashes occasional  Breast cancer surveillance: 1. Mammograms November 2015 were normal 2. Breast exam 08/14/2015 is normal   hypertension and diabetes: Being managed by her primary care physician  Left leg pain:  Much improved Return to clinic in 6 months for follow-up   And after that once a year  No orders of the defined types were placed in this encounter.   The patient has a good understanding of the overall plan. she agrees with it. she will call with any problems that may develop before the next visit here.   Rulon Eisenmenger, MD

## 2015-08-15 ENCOUNTER — Other Ambulatory Visit: Payer: Self-pay | Admitting: Internal Medicine

## 2015-09-01 ENCOUNTER — Other Ambulatory Visit: Payer: Self-pay | Admitting: Internal Medicine

## 2015-09-05 ENCOUNTER — Ambulatory Visit: Payer: Medicare Other | Admitting: Internal Medicine

## 2015-09-07 ENCOUNTER — Encounter: Payer: Self-pay | Admitting: Internal Medicine

## 2015-09-07 ENCOUNTER — Ambulatory Visit (INDEPENDENT_AMBULATORY_CARE_PROVIDER_SITE_OTHER): Payer: Medicare Other | Admitting: Internal Medicine

## 2015-09-07 VITALS — BP 120/58 | HR 88 | Temp 98.2°F | Resp 20 | Ht 62.0 in | Wt 264.4 lb

## 2015-09-07 DIAGNOSIS — E119 Type 2 diabetes mellitus without complications: Secondary | ICD-10-CM

## 2015-09-07 DIAGNOSIS — E038 Other specified hypothyroidism: Secondary | ICD-10-CM

## 2015-09-07 DIAGNOSIS — R252 Cramp and spasm: Secondary | ICD-10-CM

## 2015-09-07 DIAGNOSIS — M8949 Other hypertrophic osteoarthropathy, multiple sites: Secondary | ICD-10-CM

## 2015-09-07 DIAGNOSIS — M1000 Idiopathic gout, unspecified site: Secondary | ICD-10-CM | POA: Diagnosis not present

## 2015-09-07 DIAGNOSIS — E034 Atrophy of thyroid (acquired): Secondary | ICD-10-CM | POA: Diagnosis not present

## 2015-09-07 DIAGNOSIS — I10 Essential (primary) hypertension: Secondary | ICD-10-CM | POA: Diagnosis not present

## 2015-09-07 DIAGNOSIS — Z23 Encounter for immunization: Secondary | ICD-10-CM | POA: Diagnosis not present

## 2015-09-07 DIAGNOSIS — M150 Primary generalized (osteo)arthritis: Secondary | ICD-10-CM | POA: Diagnosis not present

## 2015-09-07 DIAGNOSIS — M159 Polyosteoarthritis, unspecified: Secondary | ICD-10-CM

## 2015-09-07 MED ORDER — GABAPENTIN 100 MG PO CAPS: 100.0000 mg | ORAL_CAPSULE | Freq: Every day | ORAL | Status: DC

## 2015-09-07 NOTE — Progress Notes (Signed)
Patient ID: Karla Willis, female   DOB: 1935-01-05, 80 y.o.   MRN: [id number]    Location:    [facility]   Place of Service:  OFFICE   Chief Complaint  Patient presents with  . Medical Management of Chronic Issues    3 month follow-up for Hypothyroidism, Hypertension, DM  . Immunizations    Would like flu shot today    HPI:  80 yo female seen today for f/u. She has cramps in her calf muscles occasionally at night. Walking and massaging helps reduce cramp severity. She also has tried yellow mustard which helped. She did not f/u with podiatry for left foot pain as it resolved on its own  DM - BS at home low 100s. No low BS reactions. She has some swelling in L>R foot. She takes actos. Occasional  numbness or tingling  HTN - stable on amlodipine, lisinopril HCT  Thyroid - stable on levothyroxine  Arthritis - stable on tramadol. Takes allopurinol for gout  Hx breast CA - followed by oncology. She has mammogram scheduled for next month. She takes tamoxifen and has occasional night sweats and hot flashes  Past Medical History  Diagnosis Date  . Allergy   . Cancer ([place])   . Diabetes mellitus without complication ([place])   . Thyroid disease   . Hypertension   . Wears glasses   . HOH (hard of hearing)   . Arthritis   . Gout   . Breast cancer ([place]) 10/24/13    right upper inner- DCIS    Past Surgical History  Procedure Laterality Date  . Gallbladder sugery    . Thyroid surgery  1970  . Cholecystectomy  1970  . Tubal ligation    . Finger surgery      right hand  . Shoulder arthroscopy  2011    left  . Colonoscopy    . Breast lumpectomy with needle localization Right 12/16/2013    Procedure: BREAST LUMPECTOMY WITH NEEDLE LOCALIZATION;  Surgeon: Edward Jolly, MD;  Location: [facility];  Service: General;  Laterality: Right;  . Breast biopsy Right     remote, benign  . Re-excision of breast lumpectomy Right 01/30/2014    Procedure: RE-EXCISION OF BREAST  LUMPECTOMY;  Surgeon: Edward Jolly, MD;  Location: [facility];  Service: General;  Laterality: Right;  . Re-excision of breast lumpectomy Right 03/06/2014    Procedure: RE-EXCISION OF BREAST LUMPECTOMY;  Surgeon: Edward Jolly, MD;  Location: [facility];  Service: General;  Laterality: Right;    Patient Care Team: Gildardo Cranker, DO as PCP - General (Internal Medicine) Nicholas Lose, MD as Consulting Physician (Hematology and Oncology)  Social History   Social History  . Marital Status: Widowed    Spouse Name: N/A  . Number of Children: N/A  . Years of Education: N/A   Occupational History  . Not on file.   Social History Main Topics  . Smoking status: Never Smoker   . Smokeless tobacco: Never Used  . Alcohol Use: No  . Drug Use: No  . Sexual Activity: Not Currently     Comment: menarche age 70, G92, P45, first live birth age 89, menopause at 51, no HRT   Other Topics Concern  . Not on file   Social History Narrative   Diet: No      Do you drink/ eat things with caffeine? Yes      Marital status:    Widowed  What year were you married ?  1956      Do you live in a house, apartment,assistred living, condo, trailer, etc.)?  apartment      Is it one or more stories?  one      How many persons live in your home ?  3      Do you have any pets in your home ?(please list)  No      Current or past profession:  Bank, Caregiver      Do you exercise?  No                            Type & how often:        Do you have a living will?  Yes      Do you have a DNR form?  Yes                     If not, do you want to discuss one?       Do you have signed POA?HPOA forms?                 If so, please bring to your        appointment           reports that she has never smoked. She has never used smokeless tobacco. She reports that she does not drink alcohol or use illicit drugs.  Allergies  Allergen  Reactions  . Hydrocodone Nausea Only    "bloated"  . Other     Nuts ( Pecans,Peanuts)  . Codeine Rash    Medications: Patient's Medications  New Prescriptions   No medications on file  Previous Medications   ALLOPURINOL (ZYLOPRIM) 100 MG TABLET    take 1 tablet by mouth once daily FOR GOUT PREVENTION   AMLODIPINE (NORVASC) 5 MG TABLET    Take 1 tablet (5 mg total) by mouth daily.   LEVOTHYROXINE (SYNTHROID, LEVOTHROID) 100 MCG TABLET    Take 1 tablet (100 mcg total) by mouth daily before breakfast.   LISINOPRIL-HYDROCHLOROTHIAZIDE (PRINZIDE,ZESTORETIC) 20-12.5 MG TABLET    take 1 tablet by mouth once daily for blood pressure   PIOGLITAZONE (ACTOS) 30 MG TABLET    TAKE 1 TABLET DAILY   TAMOXIFEN (NOLVADEX) 20 MG TABLET    take 1 tablet by mouth once daily   TRAMADOL (ULTRAM) 50 MG TABLET    Take 1 tablet (50 mg total) by mouth every 8 (eight) hours as needed.  Modified Medications   No medications on file  Discontinued Medications   No medications on file    Review of Systems  Constitutional: Negative for fever, chills, diaphoresis, activity change, appetite change and fatigue.  HENT: Negative for ear pain and sore throat.   Eyes: Negative for visual disturbance.  Respiratory: Negative for cough, chest tightness and shortness of breath.   Cardiovascular: Negative for chest pain, palpitations and leg swelling.  Gastrointestinal: Negative for nausea, vomiting, abdominal pain, diarrhea, constipation and blood in stool.  Endocrine:       Hot flashes; night sweats  Genitourinary: Negative for dysuria.  Musculoskeletal: Positive for arthralgias and gait problem.  Neurological: Positive for numbness. Negative for dizziness, tremors and headaches.  Psychiatric/Behavioral: Negative for sleep disturbance. The patient is not nervous/anxious.     Filed Vitals:   09/07/15 1415  BP: 120/58  Pulse: 88  Temp: 98.2 F (36.8 C)  TempSrc: Oral  Resp: 20  Height: 5\' 2"  (1.575 m)  Weight:  264 lb 6.4 oz (119.931 kg)  SpO2: 95%   Body mass index is 48.35 kg/(m^2).  Physical Exam  Constitutional: She is oriented to person, place, and time. She appears well-developed and well-nourished. No distress.  HENT:  Mouth/Throat: Oropharynx is clear and moist. No oropharyngeal exudate.  Eyes: Pupils are equal, round, and reactive to light. No scleral icterus.  Neck: Neck supple. Carotid bruit is not present. No tracheal deviation present. Thyromegaly (mild) present.  Cardiovascular: Normal rate, regular rhythm and intact distal pulses.  Exam reveals no gallop and no friction rub.   Murmur (2/6 SEM) heard. +2 pitting LE edema. No calf TTP  Pulmonary/Chest: Effort normal and breath sounds normal. No stridor. No respiratory distress. She has no wheezes. She has no rales.  Abdominal: Soft. Bowel sounds are normal. She exhibits no distension and no mass. There is no hepatomegaly. There is no tenderness. There is no rebound and no guarding.  Musculoskeletal: She exhibits edema and tenderness.  Lymphadenopathy:    She has no cervical adenopathy.  Neurological: She is alert and oriented to person, place, and time. She has normal reflexes.  Skin: Skin is warm and dry. No rash noted.  Egg sized freely mobile lipoma at sternoclavicular notch  Psychiatric: She has a normal mood and affect. Her behavior is normal. Judgment and thought content normal.   Diabetic Foot Exam - Simple   Simple Foot Form  Diabetic Foot exam was performed with the following findings:  Yes 09/07/2015  3:18 PM  Visual Inspection  See comments:  Yes  Sensation Testing  Intact to touch and monofilament testing bilaterally:  Yes  Pulse Check  Posterior Tibialis and Dorsalis pulse intact bilaterally:  Yes  Comments  Bunion b/l. No calluses. (+) hammertoes        Labs reviewed: No visits with results within 3 Month(s) from this visit. Latest known visit with results is:  Office Visit on 03/09/2015  Component  Date Value Ref Range Status  . TSH 03/09/2015 0.714  0.450 - 4.500 uIU/mL Final  . Free T4 03/09/2015 1.34  0.82 - 1.77 ng/dL Final  . Uric Acid 03/09/2015 7.6* 2.5 - 7.1 mg/dL Final              Therapeutic target for gout patients: <6.0  . Hgb A1c MFr Bld 03/09/2015 6.3* 4.8 - 5.6 % Final   Comment:          Pre-diabetes: 5.7 - 6.4          Diabetes: >6.4          Glycemic control for adults with diabetes: <7.0   . Est. average glucose Bld gHb Est-m* 03/09/2015 134   Final    No results found.   Assessment/Plan   ICD-9-CM ICD-10-CM   1. Cramp of both lower extremities - probable restless legs +/- neuropathy 729.82 R25.2   2. Type 2 diabetes mellitus without complication, without long-term current use of insulin (HCC) - stable 250.00 E11.9   3. Primary osteoarthritis involving multiple joints - stable 715.09 M15.0   4. Essential hypertension, benign - stable 401.1 I10   5. Hypothyroidism due to acquired atrophy of thyroid - stable 244.8 E03.8    246.8 E03.4   6. Idiopathic gout, unspecified chronicity, unspecified site - stable 274.9 M10.00 Uric Acid  7. Type 2 diabetes mellitus without complication (HCC)  409.81 E11.9 CMP     Lipid Panel  Hemoglobin A1c     Microalbumin/Creatinine Ratio, Urine  8. Encounter for immunization Z23 Z23     Start gabapentin 100mg  at bedtime for nerve pain and leg cramps  F/u with oncology as scheduled  Continue other medications as ordered  Flu shot given today  Follow up in 4 mos for CPE/ECG. Check fasting labs prior to appt  [facility] S. Perlie Gold  [facility] [address] [place], [place] [address] [phone] Cell (Monday-Friday 8 AM - 5 PM) [phone] After 5 PM and follow prompts

## 2015-09-07 NOTE — Patient Instructions (Addendum)
Start gabapentin 100mg  at bedtime for nerve pain and leg cramps  Continue other medications as ordered  Flu shot given today  Follow up in 4 mos for CPE/ECG

## 2015-09-08 LAB — LIPID PANEL
Chol/HDL Ratio: 3 ratio units (ref 0.0–4.4)
Cholesterol, Total: 171 mg/dL (ref 100–199)
HDL: 57 mg/dL (ref >39)
LDL Calculated: 88 mg/dL (ref 0–99)
Triglycerides: 129 mg/dL (ref 0–149)
VLDL Cholesterol Cal: 26 mg/dL (ref 5–40)

## 2015-09-08 LAB — COMPREHENSIVE METABOLIC PANEL
ALT: 10 IU/L (ref 0–32)
AST: 19 IU/L (ref 0–40)
Albumin/Globulin Ratio: 1.4 (ref 1.1–2.5)
Albumin: 4 g/dL (ref 3.5–4.8)
Alkaline Phosphatase: 58 IU/L (ref 39–117)
BUN/Creatinine Ratio: 15 (ref 11–26)
BUN: 22 mg/dL (ref 8–27)
Bilirubin Total: 0.2 mg/dL (ref 0.0–1.2)
CO2: 23 mmol/L (ref 18–29)
Calcium: 9.5 mg/dL (ref 8.7–10.3)
Chloride: 103 mmol/L (ref 97–106)
Creatinine, Ser: 1.45 mg/dL — ABNORMAL HIGH (ref 0.57–1.00)
GFR calc Af Amer: 40 mL/min/{1.73_m2} — ABNORMAL LOW (ref >59)
GFR calc non Af Amer: 35 mL/min/{1.73_m2} — ABNORMAL LOW (ref >59)
Globulin, Total: 2.9 g/dL (ref 1.5–4.5)
Glucose: 93 mg/dL (ref 65–99)
Potassium: 4.1 mmol/L (ref 3.5–5.2)
Sodium: 142 mmol/L (ref 136–144)
Total Protein: 6.9 g/dL (ref 6.0–8.5)

## 2015-09-08 LAB — MICROALBUMIN / CREATININE URINE RATIO
Creatinine, Urine: 99.7 mg/dL
MICROALB/CREAT RATIO: <3 mg/g creat (ref 0.0–30.0)
Microalbumin, Urine: <3 ug/mL

## 2015-09-08 LAB — HEMOGLOBIN A1C
Est. average glucose Bld gHb Est-mCnc: 128 mg/dL
Hgb A1c MFr Bld: 6.1 % — ABNORMAL HIGH (ref 4.8–5.6)

## 2015-09-08 LAB — URIC ACID: Uric Acid: 4.6 mg/dL (ref 2.5–7.1)

## 2015-10-12 ENCOUNTER — Ambulatory Visit
Admission: RE | Admit: 2015-10-12 | Discharge: 2015-10-12 | Disposition: A | Discharge status: Home / Self Care | Payer: Medicare Other | Source: Ambulatory Visit | Attending: Hematology and Oncology | Admitting: Hematology and Oncology

## 2015-10-12 DIAGNOSIS — R928 Other abnormal and inconclusive findings on diagnostic imaging of breast: Secondary | ICD-10-CM | POA: Diagnosis not present

## 2015-10-12 DIAGNOSIS — C50311 Malignant neoplasm of lower-inner quadrant of right female breast: Secondary | ICD-10-CM

## 2015-10-19 ENCOUNTER — Ambulatory Visit: Payer: Medicare Other | Admitting: Internal Medicine

## 2015-11-19 ENCOUNTER — Other Ambulatory Visit: Payer: Self-pay | Admitting: Internal Medicine

## 2016-01-03 ENCOUNTER — Other Ambulatory Visit: Payer: Self-pay | Admitting: Internal Medicine

## 2016-01-11 ENCOUNTER — Ambulatory Visit (INDEPENDENT_AMBULATORY_CARE_PROVIDER_SITE_OTHER): Payer: Medicare Other | Admitting: Internal Medicine

## 2016-01-11 ENCOUNTER — Encounter: Payer: Self-pay | Admitting: Internal Medicine

## 2016-01-11 VITALS — HR 78 | Temp 97.7°F | Resp 20 | Ht 62.0 in | Wt 249.6 lb

## 2016-01-11 DIAGNOSIS — Z853 Personal history of malignant neoplasm of breast: Secondary | ICD-10-CM

## 2016-01-11 DIAGNOSIS — R252 Cramp and spasm: Secondary | ICD-10-CM | POA: Diagnosis not present

## 2016-01-11 DIAGNOSIS — I10 Essential (primary) hypertension: Secondary | ICD-10-CM

## 2016-01-11 DIAGNOSIS — Z0000 Encounter for general adult medical examination without abnormal findings: Secondary | ICD-10-CM

## 2016-01-11 DIAGNOSIS — E119 Type 2 diabetes mellitus without complications: Secondary | ICD-10-CM

## 2016-01-11 DIAGNOSIS — E049 Nontoxic goiter, unspecified: Secondary | ICD-10-CM | POA: Diagnosis not present

## 2016-01-11 DIAGNOSIS — E034 Atrophy of thyroid (acquired): Secondary | ICD-10-CM | POA: Diagnosis not present

## 2016-01-11 DIAGNOSIS — M1000 Idiopathic gout, unspecified site: Secondary | ICD-10-CM | POA: Diagnosis not present

## 2016-01-11 DIAGNOSIS — M159 Polyosteoarthritis, unspecified: Secondary | ICD-10-CM

## 2016-01-11 DIAGNOSIS — M8949 Other hypertrophic osteoarthropathy, multiple sites: Secondary | ICD-10-CM

## 2016-01-11 DIAGNOSIS — E038 Other specified hypothyroidism: Secondary | ICD-10-CM

## 2016-01-11 DIAGNOSIS — E785 Hyperlipidemia, unspecified: Secondary | ICD-10-CM | POA: Diagnosis not present

## 2016-01-11 DIAGNOSIS — M150 Primary generalized (osteo)arthritis: Secondary | ICD-10-CM

## 2016-01-11 MED ORDER — TRAMADOL HCL 50 MG PO TABS: 50.0000 mg | ORAL_TABLET | Freq: Three times a day (TID) | ORAL | Status: DC | PRN

## 2016-01-11 MED ORDER — GLIPIZIDE ER 5 MG PO TB24: 5.0000 mg | ORAL_TABLET | Freq: Every day | ORAL | Status: DC

## 2016-01-11 NOTE — Patient Instructions (Addendum)
Encouraged her to exercise 30-45 minutes 4-5 times per week. Eat a well balanced diet. Avoid smoking. Limit alcohol intake. Wear seatbelt when riding in the car. Wear sun block (SPF >50) when spending extended times outside.  STOP ACTOS due to swelling  Start glipizide ER 5mg  daily  Continue to check blood sugars daily- fasting  Will call with lab results  Follow up in 3 mos for routine visit

## 2016-01-11 NOTE — Progress Notes (Signed)
Patient ID: Karla Willis, female   DOB: 04-Sep-1935, 81 y.o.   MRN: [id number] Subjective:     Karla Willis is a 81 y.o. female and is here for a comprehensive physical exam. The patient reports no problems.  DM - BS at home low 100s. She needs a new glucometer (accu check compact plus) as current one 79 yrs old. No low BS reactions. She has some swelling in L>R foot. She takes actos. Occasional  numbness or tingling. Last A1c 6.1%. LDL 88  Leg cramps - she had to stop gabapentin due to next day dizziness. She is taking tramadol prn which helps  HTN - stable on amlodipine, lisinopril HCT  Thyroid - stable on levothyroxine  Arthritis - stable on tramadol. Takes allopurinol for gout  Hx breast CA - followed by oncology. Last mammogram in Nov 2016 was benign. She takes tamoxifen and has occasional night sweats and hot flashes  She moved into a new home with several other family members which works out well for all of them  Past Medical History  Diagnosis Date  . Allergy   . Cancer ([place])   . Diabetes mellitus without complication ([place])   . Thyroid disease   . Hypertension   . Wears glasses   . HOH (hard of hearing)   . Arthritis   . Gout   . Breast cancer ([place]) 10/24/13    right upper inner- DCIS   Past Surgical History  Procedure Laterality Date  . Gallbladder sugery    . Thyroid surgery  1970  . Cholecystectomy  1970  . Tubal ligation    . Finger surgery      right hand  . Shoulder arthroscopy  2011    left  . Colonoscopy    . Breast lumpectomy with needle localization Right 12/16/2013    Procedure: BREAST LUMPECTOMY WITH NEEDLE LOCALIZATION;  Surgeon: Edward Jolly, MD;  Location: [facility];  Service: General;  Laterality: Right;  . Breast biopsy Right     remote, benign  . Re-excision of breast lumpectomy Right 01/30/2014    Procedure: RE-EXCISION OF BREAST LUMPECTOMY;  Surgeon: Edward Jolly, MD;  Location: [facility];   Service: General;  Laterality: Right;  . Re-excision of breast lumpectomy Right 03/06/2014    Procedure: RE-EXCISION OF BREAST LUMPECTOMY;  Surgeon: Edward Jolly, MD;  Location: [facility];  Service: General;  Laterality: Right;   Family History  Problem Relation Age of Onset  . Seizures Sister   . Cancer Sister     breast  . Multiple sclerosis Son     Deceased   . Arthritis Father   . Diabetes Mother     Social History   Social History  . Marital Status: Widowed    Spouse Name: N/A  . Number of Children: N/A  . Years of Education: N/A   Occupational History  . Not on file.   Social History Main Topics  . Smoking status: Never Smoker   . Smokeless tobacco: Never Used  . Alcohol Use: No  . Drug Use: No  . Sexual Activity: Not Currently     Comment: menarche age 33, G14, P1, first live birth age 53, menopause at 49, no HRT   Other Topics Concern  . Not on file   Social History Narrative   Diet: No      Do you drink/ eat things with caffeine? Yes      Marital status:  Widowed                           What year were you married ?  1956      Do you live in a house, apartment,assistred living, condo, trailer, etc.)?  apartment      Is it one or more stories?  one      How many persons live in your home ?  3      Do you have any pets in your home ?(please list)  No      Current or past profession:  Bank, Caregiver      Do you exercise?  No                            Type & how often:        Do you have a living will?  Yes      Do you have a DNR form?  Yes                     If not, do you want to discuss one?       Do you have signed POA?HPOA forms?                 If so, please bring to your        appointment         Health Maintenance  Topic Date Due  . OPHTHALMOLOGY EXAM  11/06/1947  . TETANUS/TDAP  11/05/1956  . ZOSTAVAX  11/05/1997  . DEXA SCAN  11/05/2002  . PNA vac Low Risk Adult (2 of 2 - PCV13) 11/10/2014  .  HEMOGLOBIN A1C  03/07/2016  . INFLUENZA VACCINE  06/10/2016  . FOOT EXAM  09/06/2016    Review of Systems   Review of Systems  Constitutional: Negative for fever, chills and malaise/fatigue.  HENT: Negative for sore throat and tinnitus.   Eyes: Negative for blurred vision and double vision.  Respiratory: Negative for cough, shortness of breath and wheezing.   Cardiovascular: Negative for chest pain, palpitations, orthopnea and leg swelling.  Gastrointestinal: Negative for heartburn, nausea, vomiting, abdominal pain, diarrhea, constipation and blood in stool.  Genitourinary: Negative for dysuria, urgency, frequency and hematuria.  Musculoskeletal: Negative for myalgias, joint pain and falls.  Skin: Negative for rash.  Neurological: Negative for dizziness, tingling, tremors, sensory change, focal weakness, seizures, loss of consciousness, weakness and headaches.  Endo/Heme/Allergies: Negative for environmental allergies. Does not bruise/bleed easily.  Psychiatric/Behavioral: Negative for depression and memory loss. The patient is not nervous/anxious and does not have insomnia.      Objective:   Filed Vitals:   01/11/16 1004  Pulse: 78  Temp: 97.7 F (36.5 C)  TempSrc: Oral  Resp: 20  Height: 5\' 2"  (1.575 m)  Weight: 249 lb 9.6 oz (113.218 kg)  SpO2: 94%       Physical Exam  Constitutional: She is oriented to person, place, and time and well-developed, well-nourished, and in no distress.  HENT:  Head: Normocephalic and atraumatic.  Right Ear: External ear normal.  Left Ear: External ear normal.  Mouth/Throat: Oropharynx is clear and moist. No oropharyngeal exudate.  Eyes: Conjunctivae and EOM are normal. Pupils are equal, round, and reactive to light. No scleral icterus.  Neck: Normal range of motion. Neck supple. Carotid bruit is not present. No tracheal deviation present. Thyromegaly (right) present.  Cardiovascular:  Normal rate, regular rhythm, normal heart sounds and  intact distal pulses.  Exam reveals no gallop and no friction rub.   No murmur heard. +1 pitting LE edema b/l. No calf TTP  Pulmonary/Chest: Effort normal and breath sounds normal. She has no wheezes. She has no rhonchi. She has no rales. She exhibits no tenderness. Right breast exhibits no inverted nipple, no mass, no nipple discharge, no skin change and no tenderness. Left breast exhibits no inverted nipple, no mass, no nipple discharge, no skin change and no tenderness. Breasts are symmetrical.  Abdominal: Soft. Bowel sounds are normal. She exhibits no distension and no mass. There is no hepatosplenomegaly. There is no tenderness. There is no rebound and no guarding.  Genitourinary:  Deferred due to age  Musculoskeletal: Normal range of motion. She exhibits edema and tenderness (right shoulder anterior and posterior wih reduced ROM; min swelling).  Lymphadenopathy:    She has no cervical adenopathy.  Neurological: She is alert and oriented to person, place, and time. She has normal reflexes. Gait normal.  Skin: Skin is warm and dry. No rash noted.  Supraclavicular grape sized freely mobile NT lipoma  Psychiatric: Mood, memory, affect and judgment normal.   Diabetic Foot Exam - Simple   Simple Foot Form  Diabetic Foot exam was performed with the following findings:  Yes 01/11/2016 10:46 AM  Visual Inspection  See comments:  Yes  Sensation Testing  See comments:  Yes  Pulse Check  Posterior Tibialis and Dorsalis pulse intact bilaterally:  Yes  Comments  Bunion b/l. Thick toenails. Monofilament testing reduced b/l.      ECG obtained and reviewed - NSR @ 60 bpm, LAD, poor R wave progression. No acute ischemic changes. No change since 3/15  Assessment:    Healthy female exam.       ICD-9-CM ICD-10-CM   1. Well adult exam V70.0 Z00.00   2. Cramp of both lower extremities 729.82 R25.2 traMADol (ULTRAM) 50 MG tablet     Basic Metabolic Panel  3. Primary osteoarthritis involving  multiple joints 715.09 M15.0 traMADol (ULTRAM) 50 MG tablet  4. Type 2 diabetes mellitus without complication, without long-term current use of insulin (HCC) 250.00 [id number] Basic Metabolic Panel     ALT     Hemoglobin A1c     glipiZIDE (GLUCOTROL XL) 5 MG 24 hr tablet  5. Essential hypertension, benign 401.1 99991111 Basic Metabolic Panel  6. Hypothyroidism due to acquired atrophy of thyroid 244.8 E03.8 TSH   246.8 E03.4 T4, Free  7. Idiopathic gout, unspecified chronicity, unspecified site 274.9 M10.00   8. Hyperlipidemia LDL goal <100 272.4 E78.5 Lipid Panel  9. Goiter 240.9 E04.9 TSH     T4, Free  10. History of breast cancer V10.3 Z85.3     Plan:     See After Visit Summary for Counseling Recommendations    Pt is UTD on health maintenance. Vaccinations are UTD. Pt maintains a healthy lifestyle. Encouraged pt to exercise 30-45 minutes 4-5 times per week. Eat a well balanced diet. Avoid smoking. Limit alcohol intake. Wear seatbelt when riding in the car. Wear sun block (SPF >50) when spending extended times outside.  STOP ACTOS due to swelling  Start glipizide ER 5mg  daily  Continue to check blood sugars daily- fasting  Will call with lab results  Follow up in 3 mos for routine visit  Jema Deegan S. Verdell Kincannon, [facility] [address]  [place], [place] [address] [phone] Cell (Monday-Friday 8 AM - 5 PM) [phone] After 5 PM and follow prompts

## 2016-01-12 LAB — BASIC METABOLIC PANEL WITH GFR
BUN/Creatinine Ratio: 16 (ref 11–26)
BUN: 19 mg/dL (ref 8–27)
CO2: 17 mmol/L — ABNORMAL LOW (ref 18–29)
Calcium: 9.5 mg/dL (ref 8.7–10.3)
Chloride: 108 mmol/L — ABNORMAL HIGH (ref 96–106)
Creatinine, Ser: 1.16 mg/dL — ABNORMAL HIGH (ref 0.57–1.00)
GFR calc Af Amer: 52 mL/min/1.73 — ABNORMAL LOW
GFR calc non Af Amer: 45 mL/min/1.73 — ABNORMAL LOW
Glucose: 98 mg/dL (ref 65–99)
Potassium: 5 mmol/L (ref 3.5–5.2)
Sodium: 146 mmol/L — ABNORMAL HIGH (ref 134–144)

## 2016-01-12 LAB — LIPID PANEL
Chol/HDL Ratio: 3 ratio (ref 0.0–4.4)
Cholesterol, Total: 180 mg/dL (ref 100–199)
HDL: 60 mg/dL
LDL Calculated: 103 mg/dL — ABNORMAL HIGH (ref 0–99)
Triglycerides: 86 mg/dL (ref 0–149)
VLDL Cholesterol Cal: 17 mg/dL (ref 5–40)

## 2016-01-12 LAB — TSH: TSH: 2.47 u[IU]/mL (ref 0.450–4.500)

## 2016-01-12 LAB — ALT: ALT: 9 IU/L (ref 0–32)

## 2016-01-12 LAB — HEMOGLOBIN A1C
Est. average glucose Bld gHb Est-mCnc: 123 mg/dL
Hgb A1c MFr Bld: 5.9 % — ABNORMAL HIGH (ref 4.8–5.6)

## 2016-01-12 LAB — T4, FREE: Free T4: 1.1 ng/dL (ref 0.82–1.77)

## 2016-01-21 ENCOUNTER — Other Ambulatory Visit: Payer: Self-pay | Admitting: Internal Medicine

## 2016-01-23 ENCOUNTER — Other Ambulatory Visit: Payer: Self-pay | Admitting: Orthopedic Surgery

## 2016-01-23 DIAGNOSIS — M25552 Pain in left hip: Secondary | ICD-10-CM | POA: Diagnosis not present

## 2016-01-23 DIAGNOSIS — M545 Low back pain: Secondary | ICD-10-CM

## 2016-01-23 DIAGNOSIS — M25572 Pain in left ankle and joints of left foot: Secondary | ICD-10-CM | POA: Diagnosis not present

## 2016-02-01 ENCOUNTER — Other Ambulatory Visit: Payer: Self-pay | Admitting: Internal Medicine

## 2016-02-01 ENCOUNTER — Other Ambulatory Visit: Payer: Self-pay | Admitting: *Deleted

## 2016-02-01 ENCOUNTER — Ambulatory Visit
Admission: RE | Admit: 2016-02-01 | Discharge: 2016-02-01 | Disposition: A | Discharge status: Home / Self Care | Payer: Medicare Other | Source: Ambulatory Visit | Attending: Orthopedic Surgery | Admitting: Orthopedic Surgery

## 2016-02-01 DIAGNOSIS — M545 Low back pain: Secondary | ICD-10-CM

## 2016-02-01 DIAGNOSIS — M4806 Spinal stenosis, lumbar region: Secondary | ICD-10-CM | POA: Diagnosis not present

## 2016-02-01 MED ORDER — GLUCOSE BLOOD VI STRP: ORAL_STRIP | Status: DC

## 2016-02-01 MED ORDER — ACCU-CHEK AVIVA PLUS W/DEVICE KIT: PACK | Status: DC

## 2016-02-01 NOTE — Telephone Encounter (Signed)
[facility] 

## 2016-02-12 DIAGNOSIS — M545 Low back pain: Secondary | ICD-10-CM | POA: Diagnosis not present

## 2016-02-12 DIAGNOSIS — M7062 Trochanteric bursitis, left hip: Secondary | ICD-10-CM | POA: Diagnosis not present

## 2016-02-12 DIAGNOSIS — M25552 Pain in left hip: Secondary | ICD-10-CM | POA: Diagnosis not present

## 2016-02-14 ENCOUNTER — Ambulatory Visit: Payer: Medicare Other | Admitting: Hematology and Oncology

## 2016-02-14 ENCOUNTER — Telehealth: Payer: Self-pay | Admitting: Hematology and Oncology

## 2016-02-14 NOTE — Telephone Encounter (Signed)
R/s pt missed appt for 4/6 to 4/20

## 2016-02-21 DIAGNOSIS — M7062 Trochanteric bursitis, left hip: Secondary | ICD-10-CM | POA: Diagnosis not present

## 2016-02-27 ENCOUNTER — Other Ambulatory Visit: Payer: Self-pay

## 2016-02-27 MED ORDER — TAMOXIFEN CITRATE 20 MG PO TABS: 20.0000 mg | ORAL_TABLET | Freq: Every day | ORAL | Status: DC

## 2016-02-28 ENCOUNTER — Encounter: Payer: Self-pay | Admitting: Hematology and Oncology

## 2016-02-28 ENCOUNTER — Ambulatory Visit (HOSPITAL_BASED_OUTPATIENT_CLINIC_OR_DEPARTMENT_OTHER): Payer: Medicare Other | Admitting: Hematology and Oncology

## 2016-02-28 ENCOUNTER — Telehealth: Payer: Self-pay | Admitting: Hematology and Oncology

## 2016-02-28 VITALS — BP 150/59 | HR 78 | Temp 98.0°F | Resp 18 | Wt 242.6 lb

## 2016-02-28 DIAGNOSIS — M79605 Pain in left leg: Secondary | ICD-10-CM

## 2016-02-28 DIAGNOSIS — Z170 Estrogen receptor positive status [ER+]: Secondary | ICD-10-CM

## 2016-02-28 DIAGNOSIS — C50311 Malignant neoplasm of lower-inner quadrant of right female breast: Secondary | ICD-10-CM

## 2016-02-28 DIAGNOSIS — E119 Type 2 diabetes mellitus without complications: Secondary | ICD-10-CM

## 2016-02-28 DIAGNOSIS — D0511 Intraductal carcinoma in situ of right breast: Secondary | ICD-10-CM | POA: Diagnosis not present

## 2016-02-28 DIAGNOSIS — I10 Essential (primary) hypertension: Secondary | ICD-10-CM

## 2016-02-28 DIAGNOSIS — R600 Localized edema: Secondary | ICD-10-CM

## 2016-02-28 NOTE — Progress Notes (Signed)
Patient Care Team: Gildardo Cranker, DO as PCP - General (Internal Medicine) Nicholas Lose, MD as Consulting Physician (Hematology and Oncology)  DIAGNOSIS: Breast cancer of lower-inner quadrant of right female breast [facility])   Staging form: Breast, AJCC 7th Edition     Clinical: Stage 0 (Tis, N0, M0) - Signed by Nicholas Lose, MD on 02/28/2016 DCIS (ductal carcinoma in situ) of breast   Staging form: Breast, AJCC 7th Edition     Clinical: Stage 0 (Tis, N0, cM0) - Unsigned     Pathologic: No stage assigned - Unsigned  SUMMARY OF ONCOLOGIC HISTORY:   Breast cancer of lower-inner quadrant of right female breast ([place])   10/19/2013 Initial Diagnosis DCIS ER/PR positive low grade   12/16/2013 Surgery Lumpectomy right breast: DCIS with calcifications low-grade 1.6 cm focally 0.1 cm to lateral margin, excision of right posterior margin continued to show DCIS that led to the third surgery for reexcision which was negative for DCIS ER 100% PR 60%   01/09/2014 -  Anti-estrogen oral therapy Tamoxifen 20 mg daily (patient not a candidate for radiation because of body habitus)   CHIEF COMPLIANT: follow-up on tamoxifen therapy for DCIS  INTERVAL HISTORY: Karla Willis is a 81 year old with above-mentioned history of DCIS of the right breast currently on tamoxifen therapy since March 2015. She appears to be tolerating it fairly well. She reports left pain that was related to bursitis. She received an injection which has been helping her. She has chronic mild leg edema. She is a Silastic compression stockings. She has occasional hot flashes which are not bothering her.  REVIEW OF SYSTEMS:   Constitutional: Denies fevers, chills or abnormal weight loss Eyes: Denies blurriness of vision Ears, nose, mouth, throat, and face: Denies mucositis or sore throat Respiratory: Denies cough, dyspnea or wheezes Cardiovascular: Denies palpitation, chest discomfort Gastrointestinal:  Denies nausea, heartburn or change in  bowel habits Skin: Denies abnormal skin rashes Lymphatics: Denies new lymphadenopathy or easy bruising Neurological:Denies numbness, tingling or new weaknesses Behavioral/Psych: Mood is stable, no new changes  Extremities: chronic mild lower extremity edema, left leg pain related to bursitis Breast:  denies any pain or lumps or nodules in either breasts All other systems were reviewed with the patient and are negative.  I have reviewed the past medical history, past surgical history, social history and family history with the patient and they are unchanged from previous note.  ALLERGIES:  is allergic to actos; hydrocodone; other; and codeine.  MEDICATIONS:  Current Outpatient Prescriptions  Medication Sig Dispense Refill  . allopurinol (ZYLOPRIM) 100 MG tablet TAKE 1 TABLET BY MOUTH ONCE DAILY FOR GOUT PREVENTION 30 tablet 4  . amLODipine (NORVASC) 5 MG tablet take 1 tablet by mouth once daily 30 tablet 5  . Blood Glucose Monitoring Suppl (ACCU-CHEK AVIVA PLUS) w/Device KIT Use As Directed 1 kit 0  . glipiZIDE (GLUCOTROL XL) 5 MG 24 hr tablet Take 1 tablet (5 mg total) by mouth daily with breakfast. 30 tablet 6  . glucose blood (ACCU-CHEK AVIVA) test strip Use as instructed 300 each 3  . levothyroxine (SYNTHROID, LEVOTHROID) 100 MCG tablet TAKE 1 TABLET BY MOUTH ONCE DAILY BEFORE BREAKFAST 30 tablet 3  . lisinopril-hydrochlorothiazide (PRINZIDE,ZESTORETIC) 20-12.5 MG tablet take 1 tablet by mouth once daily 30 tablet 5  . tamoxifen (NOLVADEX) 20 MG tablet Take 1 tablet (20 mg total) by mouth daily. 90 tablet 1  . traMADol (ULTRAM) 50 MG tablet Take 1 tablet (50 mg total) by mouth 3 (three)  times daily with meals as needed for moderate pain or severe pain. 90 tablet 0   No current facility-administered medications for this visit.    PHYSICAL EXAMINATION: ECOG PERFORMANCE STATUS: 1 - Symptomatic but completely ambulatory  Filed Vitals:   Filed Weights    GENERAL:alert, no distress  and comfortable SKIN: skin color, texture, turgor are normal, no rashes or significant lesions EYES: normal, Conjunctiva are pink and non-injected, sclera clear OROPHARYNX:no exudate, no erythema and lips, buccal mucosa, and tongue normal  NECK: supple, thyroid normal size, non-tender, without nodularity LYMPH:  no palpable lymphadenopathy in the cervical, axillary or inguinal LUNGS: clear to auscultation and percussion with normal breathing effort HEART: regular rate & rhythm and no murmurs and no lower extremity edema ABDOMEN:abdomen soft, non-tender and normal bowel sounds MUSCULOSKELETAL:no cyanosis of digits and no clubbing  NEURO: alert & oriented x 3 with fluent speech, no focal motor/sensory deficits EXTREMITIES: No lower extremity edema BREAST: No palpable masses or nodules in either right or left breasts. No palpable axillary supraclavicular or infraclavicular adenopathy no breast tenderness or nipple discharge. (exam performed in the presence of a chaperone)  LABORATORY DATA:  I have reviewed the data as listed   Chemistry      Component Value Date/Time   NA 146* 01/11/2016 1108   NA 143 02/07/2015 1106   NA 141 03/03/2014 1501   K 5.0 01/11/2016 1108   K 4.6 02/07/2015 1106   CL 108* 01/11/2016 1108   CO2 17* 01/11/2016 1108   CO2 22 02/07/2015 1106   BUN 19 01/11/2016 1108   BUN 21.3 02/07/2015 1106   BUN 35* 03/03/2014 1501   CREATININE 1.16* 01/11/2016 1108   CREATININE 1.1 02/07/2015 1106      Component Value Date/Time   CALCIUM 9.5 01/11/2016 1108   CALCIUM 9.0 02/07/2015 1106   ALKPHOS 58 09/07/2015 1519   ALKPHOS 53 02/07/2015 1106   AST 19 09/07/2015 1519   AST 17 02/07/2015 1106   ALT 9 01/11/2016 1108   ALT 11 02/07/2015 1106   BILITOT 0.2 09/07/2015 1519   BILITOT 0.34 02/07/2015 1106       Lab Results  Component Value Date   WBC 3.9 02/07/2015   HGB 11.1* 02/07/2015   HCT 34.7* 02/07/2015   MCV 88.5 02/07/2015   PLT 162 02/07/2015    NEUTROABS 2.1 02/07/2015   ASSESSMENT & PLAN:  Breast cancer of lower-inner quadrant of right female breast DCIS right breast 1.6 cm wide 3 reexcision surgeries were negative margins. Patient did not undergo radiation therapy he 100% PR 60% positive. She was started on tamoxifen in April 2015.  Tamoxifen toxicities: 1. Hot flashes occasional  Breast cancer surveillance: 1. Mammograms November 2015 were normal 2. Breast exam 08/14/2015 is normal  Hypertension and diabetes: Being managed by her primary care physician Weight loss: Patient has lost 22 pounds in 6 months. She is really not been doing anything to lead to the weight loss. She is very happy about it. She has good appetite. There are no clinical signs or symptoms of disease. I encouraged her to watch and monitor it.  Left leg pain: Much improved Return to clinic in 1 year for follow-up   No orders of the defined types were placed in this encounter.   The patient has a good understanding of the overall plan. she agrees with it. she will call with any problems that may develop before the next visit here.   Rulon Eisenmenger, MD 02/28/2016

## 2016-02-28 NOTE — Telephone Encounter (Signed)
appt made and avs printed °

## 2016-02-28 NOTE — Assessment & Plan Note (Signed)
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 Patient did not undergo radiation therapy he 100% PR 60% positive. She was started on tamoxifen in April 2015.  Tamoxifen toxicities: 1. Hot flashes occasional  Breast cancer surveillance: 1. Mammograms November 2015 were normal 2. Breast exam 08/14/2015 is normal  Hypertension and diabetes: Being managed by her primary care physician  Left leg pain: Much improved Return to clinic in 1 year for follow-up

## 2016-03-20 DIAGNOSIS — M7062 Trochanteric bursitis, left hip: Secondary | ICD-10-CM | POA: Diagnosis not present

## 2016-04-01 ENCOUNTER — Other Ambulatory Visit: Payer: Self-pay | Admitting: Orthopedic Surgery

## 2016-04-01 DIAGNOSIS — M25511 Pain in right shoulder: Secondary | ICD-10-CM | POA: Diagnosis not present

## 2016-04-01 DIAGNOSIS — M542 Cervicalgia: Secondary | ICD-10-CM | POA: Diagnosis not present

## 2016-04-06 ENCOUNTER — Other Ambulatory Visit: Payer: Self-pay | Admitting: Internal Medicine

## 2016-04-10 ENCOUNTER — Ambulatory Visit
Admission: RE | Admit: 2016-04-10 | Discharge: 2016-04-10 | Disposition: A | Discharge status: Home / Self Care | Payer: Medicare Other | Source: Ambulatory Visit | Attending: Orthopedic Surgery | Admitting: Orthopedic Surgery

## 2016-04-10 DIAGNOSIS — M25511 Pain in right shoulder: Secondary | ICD-10-CM

## 2016-04-15 DIAGNOSIS — M25511 Pain in right shoulder: Secondary | ICD-10-CM | POA: Diagnosis not present

## 2016-04-18 ENCOUNTER — Ambulatory Visit (INDEPENDENT_AMBULATORY_CARE_PROVIDER_SITE_OTHER): Payer: Medicare Other | Admitting: Internal Medicine

## 2016-04-18 ENCOUNTER — Encounter: Payer: Self-pay | Admitting: Internal Medicine

## 2016-04-18 VITALS — BP 122/68 | HR 87 | Temp 98.2°F | Ht 62.0 in | Wt 237.2 lb

## 2016-04-18 DIAGNOSIS — E119 Type 2 diabetes mellitus without complications: Secondary | ICD-10-CM | POA: Diagnosis not present

## 2016-04-18 DIAGNOSIS — E049 Nontoxic goiter, unspecified: Secondary | ICD-10-CM | POA: Diagnosis not present

## 2016-04-18 DIAGNOSIS — R634 Abnormal weight loss: Secondary | ICD-10-CM

## 2016-04-18 DIAGNOSIS — R252 Cramp and spasm: Secondary | ICD-10-CM

## 2016-04-18 DIAGNOSIS — E034 Atrophy of thyroid (acquired): Secondary | ICD-10-CM

## 2016-04-18 DIAGNOSIS — E038 Other specified hypothyroidism: Secondary | ICD-10-CM | POA: Diagnosis not present

## 2016-04-18 DIAGNOSIS — E785 Hyperlipidemia, unspecified: Secondary | ICD-10-CM | POA: Diagnosis not present

## 2016-04-18 DIAGNOSIS — M19011 Primary osteoarthritis, right shoulder: Secondary | ICD-10-CM

## 2016-04-18 MED ORDER — TETANUS-DIPHTH-ACELL PERTUSSIS 5-2.5-18.5 LF-MCG/0.5 IM SUSP: 0.5000 mL | Freq: Once | INTRAMUSCULAR | Status: DC

## 2016-04-18 NOTE — Patient Instructions (Addendum)
Continue current medications as ordered  Follow up with Ortho as scheduled  Will call with lab results  Follow up in 3 mos for routine visit.

## 2016-04-18 NOTE — Progress Notes (Signed)
Patient ID: Karla Willis, female   DOB: May 08, 1935, 81 y.o.   MRN: [id number]    Location:  [facility] Place of Service: OFFICE  Chief Complaint  Patient presents with  . Medical Management of Chronic Issues    3 month Follow up    HPI:  81 yo female seen today for f/u. She saw ortho for right shoulder pain and imaging revealed severe OA and rotator cuff disease. She will need total shoulder replacement but no sx date yet. She has severe pain that interferes with ADLs and her QOL. . She continues to have unintentional weight loss (down 27 lbs since Oct 2016)  DM - BS at home low 100s. She needs a new glucometer (accu check compact plus) as current one 79 yrs old. No low BS reactions. She has some swelling in L>R foot. She takes actos. Occasional  numbness or tingling. Last A1c 6.1%. LDL 88  Leg cramps - she had to stop gabapentin due to next day dizziness. She is taking tramadol prn which helps  HTN - stable on amlodipine, lisinopril HCT  Thyroid - stable on levothyroxine  Arthritis - stable on tramadol. Takes allopurinol for gout  Hx breast CA - followed by oncology. Last mammogram in Nov 2016 was benign. She takes tamoxifen and has occasional night sweats and hot flashes. No signs of recurrence  She moved into a new home with several other family members which works out well for all of them   Past Medical History  Diagnosis Date  . Allergy   . Cancer ([place])   . Diabetes mellitus without complication ([place])   . Thyroid disease   . Hypertension   . Wears glasses   . HOH (hard of hearing)   . Arthritis   . Gout   . 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 Gallbladder sugery    . Thyroid surgery  1970  . Cholecystectomy  1970  . Tubal ligation    . Finger surgery      right hand  . Shoulder arthroscopy  2011    left  . Colonoscopy    . Breast lumpectomy with needle localization Right 12/16/2013   Procedure: BREAST LUMPECTOMY WITH NEEDLE LOCALIZATION;  Surgeon: Edward Jolly, MD;  Location: [facility];  Service: General;  Laterality: Right;  . Breast biopsy Right     remote, benign  . Re-excision of breast lumpectomy Right 01/30/2014    Procedure: RE-EXCISION OF BREAST LUMPECTOMY;  Surgeon: Edward Jolly, MD;  Location: [facility];  Service: General;  Laterality: Right;  . Re-excision of breast lumpectomy Right 03/06/2014    Procedure: RE-EXCISION OF BREAST LUMPECTOMY;  Surgeon: Edward Jolly, MD;  Location: [facility];  Service: General;  Laterality: Right;    Patient Care Team: Gildardo Cranker, DO as PCP - General (Internal Medicine) Nicholas Lose, MD as Consulting Physician (Hematology and Oncology)  Social History   Social History  . Marital Status: Widowed    Spouse Name: N/A  . Number of Children: N/A  . Years of Education: N/A   Occupational History  . Not on file.   Social History Main Topics  . Smoking status: Never Smoker   . Smokeless tobacco: Never Used  . Alcohol Use: No  . Drug Use: No  . Sexual Activity: Not Currently     Comment: menarche age 70, G2,  P4, first live birth age 5, menopause at 52, no HRT   Other Topics Concern  . Not on file   Social History Narrative   Diet: No      Do you drink/ eat things with caffeine? Yes      Marital status:    Widowed                           What year were you married ?  1956      Do you live in a house, apartment,assistred living, condo, trailer, etc.)?  apartment      Is it one or more stories?  one      How many persons live in your home ?  3      Do you have any pets in your home ?(please list)  No      Current or past profession:  Bank, Caregiver      Do you exercise?  No                            Type & how often:        Do you have a living will?  Yes      Do you have a DNR form?  Yes                     If not, do you want to  discuss one?       Do you have signed POA?HPOA forms?                 If so, please bring to your        appointment           reports that she has never smoked. She has never used smokeless tobacco. She reports that she does not drink alcohol or use illicit drugs.  Family History  Problem Relation Age of Onset  . Seizures Sister   . Cancer Sister     breast  . Multiple sclerosis Son     Deceased   . Arthritis Father   . Diabetes Mother    Family Status  Relation Status Death Age  . Mother Deceased 60  . Father Deceased   . Sister Alive     2years old  . Sister Alive     60years old  . Son Alive     16 years old  . Daughter Alive     75 years old  . Son Alive     27 years old  . Daughter Alive     84 years old     Allergies  Allergen Reactions  . Actos [Pioglitazone] Swelling  . Hydrocodone Nausea Only    "bloated"  . Other     Nuts ( Pecans,Peanuts)  . Codeine Rash    Medications: Patient's Medications  New Prescriptions   No medications on file  Previous Medications   ALLOPURINOL (ZYLOPRIM) 100 MG TABLET    TAKE 1 TABLET BY MOUTH ONCE DAILY FOR GOUT PREVENTION   AMLODIPINE (NORVASC) 5 MG TABLET    take 1 tablet by mouth once daily   BLOOD GLUCOSE MONITORING SUPPL (ACCU-CHEK AVIVA PLUS) W/DEVICE KIT    Use As Directed   GLIPIZIDE (GLUCOTROL XL) 5 MG 24 HR TABLET    Take 1 tablet (5 mg total) by mouth daily with breakfast.   GLUCOSE BLOOD (  ACCU-CHEK AVIVA) TEST STRIP    Use as instructed   LEVOTHYROXINE (SYNTHROID, LEVOTHROID) 100 MCG TABLET    TAKE 1 TABLET BY MOUTH ONCE DAILY BEFORE BREAKFAST   LISINOPRIL-HYDROCHLOROTHIAZIDE (PRINZIDE,ZESTORETIC) 20-12.5 MG TABLET    take 1 tablet by mouth once daily   TAMOXIFEN (NOLVADEX) 20 MG TABLET    Take 1 tablet (20 mg total) by mouth daily.   TRAMADOL (ULTRAM) 50 MG TABLET    Take 1 tablet (50 mg total) by mouth 3 (three) times daily with meals as needed for moderate pain or severe pain.  Modified Medications    Modified Medication Previous Medication   TDAP (BOOSTRIX) 5-2.5-18.5 LF-MCG/0.5 INJECTION Tdap (BOOSTRIX) 5-2.5-18.5 LF-MCG/0.5 injection      Inject 0.5 mLs into the muscle once.    Inject 0.5 mLs into the muscle once.  Discontinued Medications   No medications on file    Review of Systems  Filed Vitals:   04/18/16 1059  BP: 122/68  Pulse: 87  Temp: 98.2 F (36.8 C)  TempSrc: Oral  Height: '5\' 2"'  (1.575 m)  Weight: 237 lb 3.2 oz (107.593 kg)  SpO2: 97%   Body mass index is 43.37 kg/(m^2).  Physical Exam  Constitutional: She is oriented to person, place, and time. She appears well-developed and well-nourished. No distress.  HENT:  Mouth/Throat: Oropharynx is clear and moist. No oropharyngeal exudate.  Eyes: Pupils are equal, round, and reactive to light. No scleral icterus.  Neck: Neck supple. Carotid bruit is not present. No tracheal deviation present. Thyromegaly (mild) present.  Cardiovascular: Normal rate, regular rhythm and intact distal pulses.  Exam reveals no gallop and no friction rub.   Murmur (2/6 SEM) heard. +2 pitting LE edema. No calf TTP  Pulmonary/Chest: Effort normal and breath sounds normal. No stridor. No respiratory distress. She has no wheezes. She has no rales.  Abdominal: Soft. Bowel sounds are normal. She exhibits no distension and no mass. There is no hepatomegaly. There is no tenderness. There is no rebound and no guarding.  Musculoskeletal: She exhibits edema and tenderness.  Right shoulder reduced ROM and swelling with TTP  Lymphadenopathy:    She has no cervical adenopathy.  Neurological: She is alert and oriented to person, place, and time.  Skin: Skin is warm and dry. Rash (eczematous rash on right supraclavicular area. no vesicular formation) noted.  Egg sized freely mobile lipoma at sternoclavicular notch  Psychiatric: She has a normal mood and affect. Her behavior is normal. Judgment and thought content normal.   Diabetic Foot Exam -  Simple   Simple Foot Form  Diabetic Foot exam was performed with the following findings:  Yes 04/18/2016 11:53 AM  Visual Inspection  See comments:  Yes  Sensation Testing  Intact to touch and monofilament testing bilaterally:  Yes  Pulse Check  Posterior Tibialis and Dorsalis pulse intact bilaterally:  Yes  Comments  B/l bunion. No calluses or ulcerations        Labs reviewed: No visits with results within 3 Month(s) from this visit. Latest known visit with results is:  Office Visit on 01/11/2016  Component Date Value Ref Range Status  . Glucose 01/11/2016 98  65 - 99 mg/dL Final   Comment: Specimen received in contact with cells. No visible hemolysis present. However GLUC may be decreased and K increased. Clinical correlation indicated.   . BUN 01/11/2016 19  8 - 27 mg/dL Final  . Creatinine, Ser 01/11/2016 1.16* 0.57 - 1.00 mg/dL Final  . GFR calc  non Af Amer 01/11/2016 45* >59 mL/min/1.73 Final  . GFR calc Af Amer 01/11/2016 52* >59 mL/min/1.73 Final  . BUN/Creatinine Ratio 01/11/2016 16  11 - 26 Final  . Sodium 01/11/2016 146* 134 - 144 mmol/L Final  . Potassium 01/11/2016 5.0  3.5 - 5.2 mmol/L Final  . Chloride 01/11/2016 108* 96 - 106 mmol/L Final  . CO2 01/11/2016 17* 18 - 29 mmol/L Final  . Calcium 01/11/2016 9.5  8.7 - 10.3 mg/dL Final  . ALT 01/11/2016 9  0 - 32 IU/L Final  . Cholesterol, Total 01/11/2016 180  100 - 199 mg/dL Final  . Triglycerides 01/11/2016 86  0 - 149 mg/dL Final  . HDL 01/11/2016 60  >39 mg/dL Final  . VLDL Cholesterol Cal 01/11/2016 17  5 - 40 mg/dL Final  . LDL Calculated 01/11/2016 103* 0 - 99 mg/dL Final  . Chol/HDL Ratio 01/11/2016 3.0  0.0 - 4.4 ratio units Final   Comment:                                   T. Chol/HDL Ratio                                             Men  Women                               1/2 Avg.Risk  3.4    3.3                                   Avg.Risk  5.0    4.4                                2X Avg.Risk  9.6     7.1                                3X Avg.Risk 23.4   11.0   . Hgb A1c MFr Bld 01/11/2016 5.9* 4.8 - 5.6 % Final   Comment:          Pre-diabetes: 5.7 - 6.4          Diabetes: >6.4          Glycemic control for adults with diabetes: <7.0   . Est. average glucose Bld gHb Est-m* 01/11/2016 123   Final  . TSH 01/11/2016 2.470  0.450 - 4.500 uIU/mL Final  . Free T4 01/11/2016 1.10  0.82 - 1.77 ng/dL Final    Mr Shoulder Right Wo Contrast  04/10/2016  CLINICAL DATA:  Right shoulder pain for several months. Increased over the beam past several months. EXAM: MRI OF THE RIGHT SHOULDER WITHOUT CONTRAST TECHNIQUE: Multiplanar, multisequence MR imaging of the shoulder was performed. No intravenous contrast was administered. COMPARISON:  None. FINDINGS: Rotator cuff: Severe tendinosis of the infraspinatus tendon with a high-grade partial thickness bursal surface tear involving the posterior infraspinatus tendon measuring 10 mm in anterior- posterior dimension. Severe tendinosis of the supraspinatus tendon with a small insertional interstitial tear with a possible bursal surface component. Teres  minor tendon is intact. Mild tendinosis of the subscapularis tendon. Muscles: No atrophy or fatty replacement of nor abnormal signal within, the muscles of the rotator cuff. Biceps long head: Focal moderate tendinosis of the intraarticular portion of the long head of the biceps tendon. Acromioclavicular Joint: Moderate degenerative changes of the acromioclavicular joint. Type III acromion. No subacromial/ subdeltoid bursal fluid. Glenohumeral Joint: Extensive full-thickness cartilage loss of the humeral head and glenoid with subchondral reactive marrow changes and inferior marginal osteophytes. No significant joint effusion. Synovitis in the axillary recess. Small amount of fluid in the subcoracoid recess with a 8 mm loose body. Labrum: Limited evaluation of the labrum secondary to lack of intra-articular contrast.  Diffuse labral degeneration. Bones: No acute fracture or dislocation. No aggressive osseous lesion. IMPRESSION: 1. Severe osteoarthritis of the right glenohumeral joint. Mild synovitis in the axillary recess. Small amount of fluid in the subcoracoid recess with a 8 mm loose body. 2. Severe tendinosis of the infraspinatus tendon with a high-grade partial thickness bursal surface tear involving the posterior infraspinatus tendon measuring 10 mm in anterior- posterior dimension. 3. Severe tendinosis of the supraspinatus tendon with a small insertional interstitial tear with a possible bursal surface component. 4. Mild tendinosis of the subscapularis tendon. 5. Focal moderate tendinosis of the intraarticular portion of the long head of the biceps tendon. Electronically Signed   By: Kathreen Devoid   On: 04/10/2016 09:36     Assessment/Plan   ICD-9-CM ICD-10-CM   1. Primary osteoarthritis of right shoulder 715.11 M19.011   2. Loss of weight 783.21 R63.4 CMP     CBC with Differential/Platelets  3. Hypothyroidism due to acquired atrophy of thyroid 244.8 E03.8 TSH   246.8 E03.4 CBC with Differential/Platelets  4. Type 2 diabetes mellitus without complication, without long-term current use of insulin (HCC) 250.00 E11.9 CMP     CBC with Differential/Platelets     Hemoglobin A1c  5. Cramp of both lower extremities 729.82 R25.2   6. Hyperlipidemia LDL goal <100 272.4 E78.5 Lipid Panel  7. Goiter 240.9 E04.9 TSH   Continue current medications as ordered  Follow up with Ortho as scheduled  Will call with lab results  Follow up in 3 mos for routine visit  Alek Borges S. Perlie Gold  [facility] [address] [place], [place] [address] [phone] Cell (Monday-Friday 8 AM - 5 PM) [phone] After 5 PM and follow prompts

## 2016-04-19 LAB — CBC WITH DIFFERENTIAL/PLATELET
Basophils Absolute: 0 10*3/uL (ref 0.0–0.2)
Basos: 1 %
EOS (ABSOLUTE): 0.2 10*3/uL (ref 0.0–0.4)
Eos: 4 %
Hematocrit: 36 % (ref 34.0–46.6)
Hemoglobin: 11.7 g/dL (ref 11.1–15.9)
Immature Grans (Abs): 0 10*3/uL (ref 0.0–0.1)
Immature Granulocytes: 1 %
Lymphocytes Absolute: 0.8 10*3/uL (ref 0.7–3.1)
Lymphs: 15 %
MCH: 29 pg (ref 26.6–33.0)
MCHC: 32.5 g/dL (ref 31.5–35.7)
MCV: 89 fL (ref 79–97)
Monocytes Absolute: 0.4 10*3/uL (ref 0.1–0.9)
Monocytes: 8 %
Neutrophils Absolute: 4.1 10*3/uL (ref 1.4–7.0)
Neutrophils: 71 %
Platelets: 185 10*3/uL (ref 150–379)
RBC: 4.03 x10E6/uL (ref 3.77–5.28)
RDW: 14.4 % (ref 12.3–15.4)
WBC: 5.7 10*3/uL (ref 3.4–10.8)

## 2016-04-19 LAB — LIPID PANEL
Chol/HDL Ratio: 2.8 ratio units (ref 0.0–4.4)
Cholesterol, Total: 170 mg/dL (ref 100–199)
HDL: 60 mg/dL (ref >39)
LDL Calculated: 90 mg/dL (ref 0–99)
Triglycerides: 98 mg/dL (ref 0–149)
VLDL Cholesterol Cal: 20 mg/dL (ref 5–40)

## 2016-04-19 LAB — COMPREHENSIVE METABOLIC PANEL
ALT: 11 IU/L (ref 0–32)
AST: 17 IU/L (ref 0–40)
Albumin/Globulin Ratio: 1.4 (ref 1.2–2.2)
Albumin: 3.7 g/dL (ref 3.5–4.8)
Alkaline Phosphatase: 66 IU/L (ref 39–117)
BUN/Creatinine Ratio: 22 (ref 12–28)
BUN: 28 mg/dL — ABNORMAL HIGH (ref 8–27)
Bilirubin Total: 0.3 mg/dL (ref 0.0–1.2)
CO2: 23 mmol/L (ref 18–29)
Calcium: 9.5 mg/dL (ref 8.7–10.3)
Chloride: 103 mmol/L (ref 96–106)
Creatinine, Ser: 1.3 mg/dL — ABNORMAL HIGH (ref 0.57–1.00)
GFR calc Af Amer: 45 mL/min/{1.73_m2} — ABNORMAL LOW (ref >59)
GFR calc non Af Amer: 39 mL/min/{1.73_m2} — ABNORMAL LOW (ref >59)
Globulin, Total: 2.7 g/dL (ref 1.5–4.5)
Glucose: 92 mg/dL (ref 65–99)
Potassium: 3.9 mmol/L (ref 3.5–5.2)
Sodium: 142 mmol/L (ref 134–144)
Total Protein: 6.4 g/dL (ref 6.0–8.5)

## 2016-04-19 LAB — HEMOGLOBIN A1C
Est. average glucose Bld gHb Est-mCnc: 134 mg/dL
Hgb A1c MFr Bld: 6.3 % — ABNORMAL HIGH (ref 4.8–5.6)

## 2016-04-19 LAB — TSH: TSH: 0.574 u[IU]/mL (ref 0.450–4.500)

## 2016-04-21 ENCOUNTER — Telehealth: Payer: Self-pay | Admitting: *Deleted

## 2016-04-21 NOTE — Telephone Encounter (Signed)
Received fax from [place] [facility] for Pre operative Clearance Form. Printed last OV note and Face sheet and attached and placed for Dr. Eulas Post to review and sign. To be faxed back to [facility] at Fax #: [phone]

## 2016-05-06 ENCOUNTER — Other Ambulatory Visit: Payer: Self-pay | Admitting: Physician Assistant

## 2016-05-06 DIAGNOSIS — M25511 Pain in right shoulder: Secondary | ICD-10-CM | POA: Diagnosis not present

## 2016-05-06 NOTE — H&P (Signed)
  Karla Willis is an old patient coming in with a new issue.  Right shoulder pain.  Insidious onset.  This is chronic, but over the last couple of months it has gotten steadily worse.  Rest pain and night pain.  Losing motion.  Not bad with her arm at her side, but when she tries to go over her head significantly worse.  She also has a lot of neck stiffness, but really not radicular complaints.  She has been seen recently and being treated for spinal stenosis, lumbar spine, with injection.  No intervention, right shoulder.  Of note, she has had a left total shoulder replacement done by Dr. Tamera Punt back in 2012.  That helped and she has continued to do reasonably well on that side.   Remaining history and general exam is reviewed.    EXAMINATION: Specifically, 81 year-old female.  Height: 5?2.  Weight: 241 pounds.  Lungs clear to auscultation bilaterally.  Heart sounds normal.  She has decreased lumbar, as well as cervical motion.  She is neurovascularly intact both upper extremities.  The left shoulder she has gotten reasonable, not quite full, motion, but is comfortable.  On the right she lacks a good 40% motion active and passive, especially trying to go overhead.  Not a lot of pain with her arm at her side.  Markedly positive impingement.  There is not a lot of dramatic atrophy.    X-RAYS: Three view x-ray of her shoulder shows a Type IV acromion.  Marked degenerative changes AC joint.  Glenohumeral joint near bone on bone.  Reasonable subacromial space.   Two view x-ray of the cervical spine shows marked degenerative disc disease from C3 all the way down to T1.  Nothing that looks acute.    DISPOSITION:  For the most part her current symptoms are from her shoulder.  I think her cuff is probably intact.  We are going to try a subacromial injection today and see how much improvement I get from that.  MRI to look at her cuff.  Follow up with me when that is complete.  The question here is whether or not I  can do a shoulder scope and decompression and whether that will help her or whether or not I should just proceed with total shoulder replacement.  Final decision after we see her scan and her response to injection.  If she does not improve much with subacromial injection I will add intraarticular, but if that helps at the end of the day the treatment would be a total shoulder replacement.  I went over this with her and she understands.  Further discussion after we see her scan.    PROCEDURE NOTE: The patient's clinical condition is marked by substantial pain and/or significant functional disability.  Other conservative therapy has not provided relief, is contraindicated, or not appropriate.  There is a reasonable likelihood that injection will significantly improve the patient's pain and/or functional disability. After appropriate consent and under sterile technique subacromial injection of the right shoulder with 40 mg of Depo-Medrol and Marcaine.  With Marcaine in place her motion is better, about 80%.  Not normal, but she appears to be somewhat improved.    Addendum: MRI reveals partial rotator cuff tear.  We will proceed with right total vs. Reverse total shoulder replacement.  Risks, benefits and possible complications reviewed.  Rehab and recovery time discussed.

## 2016-05-09 ENCOUNTER — Encounter (HOSPITAL_COMMUNITY): Payer: Self-pay

## 2016-05-09 ENCOUNTER — Encounter (HOSPITAL_COMMUNITY)
Admission: RE | Admit: 2016-05-09 | Discharge: 2016-05-09 | Disposition: A | Discharge status: Home / Self Care | Payer: Medicare Other | Source: Ambulatory Visit | Attending: Orthopedic Surgery | Admitting: Orthopedic Surgery

## 2016-05-09 DIAGNOSIS — M19011 Primary osteoarthritis, right shoulder: Secondary | ICD-10-CM | POA: Diagnosis not present

## 2016-05-09 DIAGNOSIS — Z0183 Encounter for blood typing: Secondary | ICD-10-CM | POA: Diagnosis not present

## 2016-05-09 DIAGNOSIS — Z01812 Encounter for preprocedural laboratory examination: Secondary | ICD-10-CM | POA: Insufficient documentation

## 2016-05-09 HISTORY — DX: Pneumonia, unspecified organism: J18.9

## 2016-05-09 HISTORY — DX: Hypothyroidism, unspecified: E03.9

## 2016-05-09 HISTORY — DX: Personal history of other diseases of the digestive system: Z87.19

## 2016-05-09 HISTORY — DX: Gastro-esophageal reflux disease without esophagitis: K21.9

## 2016-05-09 LAB — BASIC METABOLIC PANEL
Anion gap: 6 (ref 5–15)
BUN: 22 mg/dL — ABNORMAL HIGH (ref 6–20)
CO2: 23 mmol/L (ref 22–32)
Calcium: 9.1 mg/dL (ref 8.9–10.3)
Chloride: 111 mmol/L (ref 101–111)
Creatinine, Ser: 1.2 mg/dL — ABNORMAL HIGH (ref 0.44–1.00)
GFR calc Af Amer: 49 mL/min — ABNORMAL LOW (ref >60)
GFR calc non Af Amer: 42 mL/min — ABNORMAL LOW (ref >60)
Glucose, Bld: 140 mg/dL — ABNORMAL HIGH (ref 65–99)
Potassium: 3.9 mmol/L (ref 3.5–5.1)
Sodium: 140 mmol/L (ref 135–145)

## 2016-05-09 LAB — TYPE AND SCREEN
ABO/RH(D): B POS
Antibody Screen: NEGATIVE

## 2016-05-09 LAB — CBC
HCT: 32.2 % — ABNORMAL LOW (ref 36.0–46.0)
Hemoglobin: 10.8 g/dL — ABNORMAL LOW (ref 12.0–15.0)
MCH: 28.8 pg (ref 26.0–34.0)
MCHC: 33.5 g/dL (ref 30.0–36.0)
MCV: 85.9 fL (ref 78.0–100.0)
Platelets: 185 10*3/uL (ref 150–400)
RBC: 3.75 MIL/uL — ABNORMAL LOW (ref 3.87–5.11)
RDW: 14.2 % (ref 11.5–15.5)
WBC: 5.7 10*3/uL (ref 4.0–10.5)

## 2016-05-09 LAB — SURGICAL PCR SCREEN
MRSA, PCR: NEGATIVE
Staphylococcus aureus: POSITIVE — AB

## 2016-05-09 LAB — GLUCOSE, CAPILLARY: Glucose-Capillary: 137 mg/dL — ABNORMAL HIGH (ref 65–99)

## 2016-05-09 NOTE — Pre-Procedure Instructions (Addendum)
TARREN POZO  05/09/2016      [id number], [place] [address] Phone: [phone] Fax: [place], [place] [facility] [address] [place] [address] [place] [address] Phone: [phone] Fax: [phone]    Your procedure is scheduled on 05/21/16  Report to [facility] Admitting at 1100 A.M.  Call this number if you have problems the morning of surgery:  [phone]   Remember:  Do not eat food or drink liquids after midnight.  Take these medicines the morning of surgery with A SIP OF WATER allopurinol,amlodipine(norvasc),levothyroxine,tamoxifen, tramadol if needed  No glipizide morning of surgery  STOP all herbel meds, nsaids (aleve,naproxen,advil,ibuprofen) 5 days prior to surgery starting 05/15/16 including vitamins, aspirin   Do not wear jewelry, make-up or nail polish.  Do not wear lotions, powders, or perfumes.  You may wear deoderant.  Do not shave 48 hours prior to surgery.  Men may shave face and neck.  Do not bring valuables to the hospital.  [facility] is not responsible for any belongings or valuables.  Contacts, dentures or bridgework may not be worn into surgery.  Leave your suitcase in the car.  After surgery it may be brought to your room.  For patients admitted to the hospital, discharge time will be determined by your treatment team.  Patients discharged the day of surgery will not be allowed to drive home.   Name and phone number of your driver:  Special instructions: Special Instructions: [place] - Preparing for Surgery  Before surgery, you can play an important role.  Because skin is not sterile, your skin needs to be as free of germs as possible.  You can reduce the number of germs on you skin by washing with CHG (chlorahexidine gluconate) soap before surgery.  CHG is an antiseptic cleaner  which kills germs and bonds with the skin to continue killing germs even after washing.  Please DO NOT use if you have an allergy to CHG or antibacterial soaps.  If your skin becomes reddened/irritated stop using the CHG and inform your nurse when you arrive at Short Stay.  Do not shave (including legs and underarms) for at least 48 hours prior to the first CHG shower.  You may shave your face.  Please follow these instructions carefully:   1.  Shower with CHG Soap the night before surgery and the morning of Surgery.  2.  If you choose to wash your hair, wash your hair first as usual with your normal shampoo.  3.  After you shampoo, rinse your hair and body thoroughly to remove the Shampoo.  4.  Use CHG as you would any other liquid soap.  You can apply chg directly  to the skin and wash gently with scrungie or a clean washcloth.  5.  Apply the CHG Soap to your body ONLY FROM THE NECK DOWN.  Do not use on open wounds or open sores.  Avoid contact with your eyes ears, mouth and genitals (private parts).  Wash genitals (private parts)       with your normal soap.  6.  Wash thoroughly, paying special attention to the area where your surgery will be performed.  7.  Thoroughly rinse your body with warm water from the neck down.  8.  DO NOT shower/wash with your normal soap after using and rinsing off  the CHG Soap.  9.  Pat yourself dry with a clean towel.            10.  Wear clean pajamas.            11.  Place clean sheets on your bed the night of your first shower and do not sleep with pets.  Day of Surgery  Do not apply any lotions/deodorants the morning of surgery.  Please wear clean clothes to the hospital/surgery center.  Please read over the following fact sheets that you were given. Pain Booklet, MRSA Information and Surgical Site Infection Prevention

## 2016-05-18 ENCOUNTER — Other Ambulatory Visit: Payer: Self-pay | Admitting: Internal Medicine

## 2016-05-20 MED ORDER — LACTATED RINGERS IV SOLN: INTRAVENOUS | Status: DC

## 2016-05-20 MED ORDER — CEFAZOLIN SODIUM-DEXTROSE 2-4 GM/100ML-% IV SOLN
2.0000 g | INTRAVENOUS | Status: AC
  Administered 2016-05-21: 2 g via INTRAVENOUS
  Filled 2016-05-20: qty 100

## 2016-05-21 ENCOUNTER — Inpatient Hospital Stay (HOSPITAL_COMMUNITY): Payer: Medicare Other | Admitting: Certified Registered Nurse Anesthetist

## 2016-05-21 ENCOUNTER — Inpatient Hospital Stay (HOSPITAL_COMMUNITY): Payer: Medicare Other | Admitting: Emergency Medicine

## 2016-05-21 ENCOUNTER — Inpatient Hospital Stay (HOSPITAL_COMMUNITY)
Admission: RE | Admit: 2016-05-21 | Discharge: 2016-05-22 | DRG: 483 | Disposition: A | Discharge status: Home / Self Care | Payer: Medicare Other | Source: Ambulatory Visit | Attending: Orthopedic Surgery | Admitting: Orthopedic Surgery

## 2016-05-21 ENCOUNTER — Encounter (HOSPITAL_COMMUNITY)
Admission: RE | Disposition: A | Discharge status: Home / Self Care | Payer: Self-pay | Source: Ambulatory Visit | Attending: Orthopedic Surgery

## 2016-05-21 ENCOUNTER — Inpatient Hospital Stay (HOSPITAL_COMMUNITY): Payer: Medicare Other

## 2016-05-21 ENCOUNTER — Encounter (HOSPITAL_COMMUNITY): Payer: Self-pay | Admitting: Certified Registered Nurse Anesthetist

## 2016-05-21 DIAGNOSIS — E039 Hypothyroidism, unspecified: Secondary | ICD-10-CM | POA: Diagnosis not present

## 2016-05-21 DIAGNOSIS — I10 Essential (primary) hypertension: Secondary | ICD-10-CM | POA: Diagnosis present

## 2016-05-21 DIAGNOSIS — M75110 Incomplete rotator cuff tear or rupture of unspecified shoulder, not specified as traumatic: Secondary | ICD-10-CM | POA: Diagnosis present

## 2016-05-21 DIAGNOSIS — R269 Unspecified abnormalities of gait and mobility: Secondary | ICD-10-CM | POA: Diagnosis not present

## 2016-05-21 DIAGNOSIS — M109 Gout, unspecified: Secondary | ICD-10-CM | POA: Diagnosis present

## 2016-05-21 DIAGNOSIS — M25511 Pain in right shoulder: Secondary | ICD-10-CM | POA: Diagnosis present

## 2016-05-21 DIAGNOSIS — G8918 Other acute postprocedural pain: Secondary | ICD-10-CM | POA: Diagnosis not present

## 2016-05-21 DIAGNOSIS — M19011 Primary osteoarthritis, right shoulder: Principal | ICD-10-CM | POA: Diagnosis present

## 2016-05-21 DIAGNOSIS — K219 Gastro-esophageal reflux disease without esophagitis: Secondary | ICD-10-CM | POA: Diagnosis present

## 2016-05-21 DIAGNOSIS — E119 Type 2 diabetes mellitus without complications: Secondary | ICD-10-CM | POA: Diagnosis not present

## 2016-05-21 DIAGNOSIS — Z96611 Presence of right artificial shoulder joint: Secondary | ICD-10-CM | POA: Diagnosis not present

## 2016-05-21 DIAGNOSIS — Z853 Personal history of malignant neoplasm of breast: Secondary | ICD-10-CM | POA: Diagnosis not present

## 2016-05-21 DIAGNOSIS — H9190 Unspecified hearing loss, unspecified ear: Secondary | ICD-10-CM | POA: Diagnosis present

## 2016-05-21 DIAGNOSIS — Z471 Aftercare following joint replacement surgery: Secondary | ICD-10-CM | POA: Diagnosis not present

## 2016-05-21 DIAGNOSIS — Z6841 Body Mass Index (BMI) 40.0 and over, adult: Secondary | ICD-10-CM | POA: Diagnosis not present

## 2016-05-21 DIAGNOSIS — S46111A Strain of muscle, fascia and tendon of long head of biceps, right arm, initial encounter: Secondary | ICD-10-CM | POA: Diagnosis not present

## 2016-05-21 DIAGNOSIS — M1990 Unspecified osteoarthritis, unspecified site: Secondary | ICD-10-CM | POA: Diagnosis not present

## 2016-05-21 HISTORY — PX: TOTAL SHOULDER ARTHROPLASTY: SHX126

## 2016-05-21 LAB — GLUCOSE, CAPILLARY
Glucose-Capillary: 96 mg/dL (ref 65–99)
Glucose-Capillary: 182 mg/dL — ABNORMAL HIGH (ref 65–99)
Glucose-Capillary: 161 mg/dL — ABNORMAL HIGH (ref 65–99)
Glucose-Capillary: 140 mg/dL — ABNORMAL HIGH (ref 65–99)

## 2016-05-21 SURGERY — ARTHROPLASTY, SHOULDER, TOTAL
Anesthesia: General | Site: Shoulder | Laterality: Right

## 2016-05-21 MED ORDER — GLYCOPYRROLATE 0.2 MG/ML IV SOSY
PREFILLED_SYRINGE | INTRAVENOUS | Status: AC
  Filled 2016-05-21: qty 3

## 2016-05-21 MED ORDER — ARTIFICIAL TEARS OP OINT
TOPICAL_OINTMENT | OPHTHALMIC | Status: DC | PRN
  Administered 2016-05-21: 1 via OPHTHALMIC

## 2016-05-21 MED ORDER — BUPIVACAINE HCL (PF) 0.25 % IJ SOLN
INTRAMUSCULAR | Status: AC
  Filled 2016-05-21: qty 30

## 2016-05-21 MED ORDER — MIDAZOLAM HCL 2 MG/2ML IJ SOLN
INTRAMUSCULAR | Status: AC
Start: 2016-05-21 — End: 2016-05-21
  Filled 2016-05-21: qty 2

## 2016-05-21 MED ORDER — ARTIFICIAL TEARS OP OINT
TOPICAL_OINTMENT | OPHTHALMIC | Status: AC
  Filled 2016-05-21: qty 3.5

## 2016-05-21 MED ORDER — SUCCINYLCHOLINE CHLORIDE 200 MG/10ML IV SOSY
PREFILLED_SYRINGE | INTRAVENOUS | Status: AC
  Filled 2016-05-21: qty 10

## 2016-05-21 MED ORDER — ALBUMIN HUMAN 5 % IV SOLN
INTRAVENOUS | Status: AC
  Filled 2016-05-21: qty 250

## 2016-05-21 MED ORDER — ALUM & MAG HYDROXIDE-SIMETH 200-200-20 MG/5ML PO SUSP: 30.0000 mL | ORAL | Status: DC | PRN

## 2016-05-21 MED ORDER — TAMOXIFEN CITRATE 20 MG PO TABS: 20.0000 mg | ORAL_TABLET | Freq: Every day | ORAL | Status: DC

## 2016-05-21 MED ORDER — AMLODIPINE BESYLATE 5 MG PO TABS
5.0000 mg | ORAL_TABLET | Freq: Every day | ORAL | Status: DC
  Administered 2016-05-22: 5 mg via ORAL
  Filled 2016-05-21: qty 1

## 2016-05-21 MED ORDER — LIDOCAINE-EPINEPHRINE (PF) 1.5 %-1:200000 IJ SOLN
INTRAMUSCULAR | Status: DC | PRN
  Administered 2016-05-21: 10 mL via PERINEURAL

## 2016-05-21 MED ORDER — ONDANSETRON HCL 4 MG/2ML IJ SOLN: 4.0000 mg | Freq: Four times a day (QID) | INTRAMUSCULAR | Status: DC | PRN

## 2016-05-21 MED ORDER — PROMETHAZINE HCL 25 MG/ML IJ SOLN: 6.2500 mg | INTRAMUSCULAR | Status: DC | PRN

## 2016-05-21 MED ORDER — GLYCOPYRROLATE 0.2 MG/ML IJ SOLN
INTRAMUSCULAR | Status: DC | PRN
  Administered 2016-05-21 (×2): 0.1 mg via INTRAVENOUS

## 2016-05-21 MED ORDER — PROPOFOL 10 MG/ML IV BOLUS
INTRAVENOUS | Status: AC
Start: 2016-05-21 — End: 2016-05-21
  Filled 2016-05-21: qty 20

## 2016-05-21 MED ORDER — METOCLOPRAMIDE HCL 5 MG/ML IJ SOLN: 5.0000 mg | Freq: Three times a day (TID) | INTRAMUSCULAR | Status: DC | PRN

## 2016-05-21 MED ORDER — PHENOL 1.4 % MT LIQD: 1.0000 | OROMUCOSAL | Status: DC | PRN

## 2016-05-21 MED ORDER — ONDANSETRON HCL 4 MG/2ML IJ SOLN
INTRAMUSCULAR | Status: DC | PRN
  Administered 2016-05-21: 4 mg via INTRAVENOUS

## 2016-05-21 MED ORDER — FENTANYL CITRATE (PF) 250 MCG/5ML IJ SOLN
INTRAMUSCULAR | Status: DC | PRN
  Administered 2016-05-21 (×2): 50 ug via INTRAVENOUS

## 2016-05-21 MED ORDER — HYDROMORPHONE HCL 1 MG/ML IJ SOLN
0.5000 mg | INTRAMUSCULAR | Status: DC | PRN
  Administered 2016-05-21 – 2016-05-22 (×2): 1 mg via INTRAVENOUS
  Filled 2016-05-21 (×2): qty 1

## 2016-05-21 MED ORDER — DIPHENHYDRAMINE HCL 12.5 MG/5ML PO ELIX: 12.5000 mg | ORAL_SOLUTION | ORAL | Status: DC | PRN

## 2016-05-21 MED ORDER — MAGNESIUM CITRATE PO SOLN: 1.0000 | Freq: Once | ORAL | Status: DC | PRN

## 2016-05-21 MED ORDER — LISINOPRIL-HYDROCHLOROTHIAZIDE 20-12.5 MG PO TABS: 1.0000 | ORAL_TABLET | Freq: Every day | ORAL | Status: DC

## 2016-05-21 MED ORDER — ONDANSETRON HCL 4 MG PO TABS: 4.0000 mg | ORAL_TABLET | Freq: Three times a day (TID) | ORAL | Status: DC | PRN

## 2016-05-21 MED ORDER — HYDROMORPHONE HCL 1 MG/ML IJ SOLN
INTRAMUSCULAR | Status: AC
  Filled 2016-05-21: qty 1

## 2016-05-21 MED ORDER — DIAZEPAM 2 MG PO TABS
2.0000 mg | ORAL_TABLET | Freq: Three times a day (TID) | ORAL | Status: DC | PRN
  Administered 2016-05-21 – 2016-05-22 (×2): 2 mg via ORAL
  Filled 2016-05-21 (×2): qty 1

## 2016-05-21 MED ORDER — MENTHOL 3 MG MT LOZG: 1.0000 | LOZENGE | OROMUCOSAL | Status: DC | PRN

## 2016-05-21 MED ORDER — BISACODYL 10 MG RE SUPP: 10.0000 mg | Freq: Every day | RECTAL | Status: DC | PRN

## 2016-05-21 MED ORDER — POTASSIUM CHLORIDE IN NACL 20-0.9 MEQ/L-% IV SOLN
INTRAVENOUS | Status: DC
  Filled 2016-05-21 (×2): qty 1000

## 2016-05-21 MED ORDER — BUPIVACAINE HCL (PF) 0.5 % IJ SOLN
INTRAMUSCULAR | Status: DC | PRN
  Administered 2016-05-21: 20 mL via PERINEURAL

## 2016-05-21 MED ORDER — FENTANYL CITRATE (PF) 250 MCG/5ML IJ SOLN
INTRAMUSCULAR | Status: AC
  Filled 2016-05-21: qty 5

## 2016-05-21 MED ORDER — PROPOFOL 10 MG/ML IV BOLUS
INTRAVENOUS | Status: AC
  Filled 2016-05-21: qty 20

## 2016-05-21 MED ORDER — ONDANSETRON HCL 4 MG/2ML IJ SOLN
INTRAMUSCULAR | Status: AC
  Filled 2016-05-21: qty 2

## 2016-05-21 MED ORDER — INSULIN ASPART 100 UNIT/ML ~~LOC~~ SOLN
0.0000 [IU] | Freq: Three times a day (TID) | SUBCUTANEOUS | Status: DC
  Administered 2016-05-21 – 2016-05-22 (×2): 2 [IU] via SUBCUTANEOUS

## 2016-05-21 MED ORDER — HYDROMORPHONE HCL 1 MG/ML IJ SOLN
0.2500 mg | INTRAMUSCULAR | Status: DC | PRN
  Administered 2016-05-21: 0.25 mg via INTRAVENOUS

## 2016-05-21 MED ORDER — METOCLOPRAMIDE HCL 5 MG PO TABS: 5.0000 mg | ORAL_TABLET | Freq: Three times a day (TID) | ORAL | Status: DC | PRN

## 2016-05-21 MED ORDER — ONDANSETRON HCL 4 MG PO TABS: 4.0000 mg | ORAL_TABLET | Freq: Four times a day (QID) | ORAL | Status: DC | PRN

## 2016-05-21 MED ORDER — TAMOXIFEN CITRATE 10 MG PO TABS
20.0000 mg | ORAL_TABLET | Freq: Every day | ORAL | Status: DC
  Administered 2016-05-22: 20 mg via ORAL
  Filled 2016-05-21: qty 2

## 2016-05-21 MED ORDER — LEVOTHYROXINE SODIUM 100 MCG PO TABS
100.0000 ug | ORAL_TABLET | Freq: Every day | ORAL | Status: DC
  Administered 2016-05-22: 100 ug via ORAL
  Filled 2016-05-21: qty 1

## 2016-05-21 MED ORDER — CEFAZOLIN SODIUM-DEXTROSE 2-4 GM/100ML-% IV SOLN
2.0000 g | Freq: Four times a day (QID) | INTRAVENOUS | Status: AC
  Administered 2016-05-21 – 2016-05-22 (×3): 2 g via INTRAVENOUS
  Filled 2016-05-21 (×3): qty 100

## 2016-05-21 MED ORDER — BUPIVACAINE-EPINEPHRINE (PF) 0.5% -1:200000 IJ SOLN
INTRAMUSCULAR | Status: AC
  Filled 2016-05-21: qty 30

## 2016-05-21 MED ORDER — POLYETHYLENE GLYCOL 3350 17 G PO PACK: 17.0000 g | PACK | Freq: Every day | ORAL | Status: DC | PRN

## 2016-05-21 MED ORDER — ZOLPIDEM TARTRATE 5 MG PO TABS: 5.0000 mg | ORAL_TABLET | Freq: Every evening | ORAL | Status: DC | PRN

## 2016-05-21 MED ORDER — ACETAMINOPHEN 325 MG PO TABS: 650.0000 mg | ORAL_TABLET | Freq: Four times a day (QID) | ORAL | Status: DC | PRN

## 2016-05-21 MED ORDER — LIDOCAINE 2% (20 MG/ML) 5 ML SYRINGE
INTRAMUSCULAR | Status: AC
Start: 2016-05-21 — End: 2016-05-21
  Filled 2016-05-21: qty 5

## 2016-05-21 MED ORDER — SUCCINYLCHOLINE CHLORIDE 20 MG/ML IJ SOLN
INTRAMUSCULAR | Status: DC | PRN
  Administered 2016-05-21: 120 mg via INTRAVENOUS

## 2016-05-21 MED ORDER — ACETAMINOPHEN 650 MG RE SUPP: 650.0000 mg | Freq: Four times a day (QID) | RECTAL | Status: DC | PRN

## 2016-05-21 MED ORDER — ALBUMIN HUMAN 5 % IV SOLN
12.5000 g | Freq: Once | INTRAVENOUS | Status: AC
  Administered 2016-05-21: 12.5 g via INTRAVENOUS

## 2016-05-21 MED ORDER — METOPROLOL TARTARATE 1 MG/ML SYRINGE (5ML)
Status: DC | PRN
  Administered 2016-05-21: 2 mg via INTRAVENOUS

## 2016-05-21 MED ORDER — CHLORHEXIDINE GLUCONATE 4 % EX LIQD
60.0000 mL | Freq: Once | CUTANEOUS | Status: DC
Start: 2016-05-21 — End: 2016-05-21

## 2016-05-21 MED ORDER — CHLORHEXIDINE GLUCONATE 4 % EX LIQD: 60.0000 mL | Freq: Once | CUTANEOUS | Status: DC

## 2016-05-21 MED ORDER — HYDROMORPHONE HCL 2 MG PO TABS: ORAL_TABLET | ORAL | Status: DC

## 2016-05-21 MED ORDER — PHENYLEPHRINE HCL 10 MG/ML IJ SOLN
10.0000 mg | INTRAMUSCULAR | Status: DC | PRN
  Administered 2016-05-21: 50 ug/min via INTRAVENOUS
  Administered 2016-05-21: 10:00:00 via INTRAVENOUS

## 2016-05-21 MED ORDER — GLIPIZIDE ER 5 MG PO TB24
5.0000 mg | ORAL_TABLET | Freq: Every day | ORAL | Status: DC
  Administered 2016-05-22: 5 mg via ORAL
  Filled 2016-05-21: qty 1

## 2016-05-21 MED ORDER — PROPOFOL 10 MG/ML IV BOLUS
INTRAVENOUS | Status: DC | PRN
  Administered 2016-05-21 (×3): 50 mg via INTRAVENOUS
  Administered 2016-05-21: 150 mg via INTRAVENOUS
  Administered 2016-05-21: 50 mg via INTRAVENOUS

## 2016-05-21 MED ORDER — DOCUSATE SODIUM 100 MG PO CAPS
100.0000 mg | ORAL_CAPSULE | Freq: Two times a day (BID) | ORAL | Status: DC
  Administered 2016-05-21 – 2016-05-22 (×2): 100 mg via ORAL
  Filled 2016-05-21 (×2): qty 1

## 2016-05-21 MED ORDER — LIDOCAINE HCL (CARDIAC) 20 MG/ML IV SOLN
INTRAVENOUS | Status: DC | PRN
  Administered 2016-05-21: 60 mg via INTRATRACHEAL

## 2016-05-21 MED ORDER — HYDROMORPHONE HCL 2 MG PO TABS
2.0000 mg | ORAL_TABLET | ORAL | Status: DC | PRN
  Administered 2016-05-21 – 2016-05-22 (×4): 2 mg via ORAL
  Filled 2016-05-21 (×4): qty 1

## 2016-05-21 MED ORDER — METOPROLOL TARTRATE 5 MG/5ML IV SOLN
INTRAVENOUS | Status: AC
  Filled 2016-05-21: qty 5

## 2016-05-21 MED ORDER — LACTATED RINGERS IV SOLN
INTRAVENOUS | Status: DC | PRN
  Administered 2016-05-21 (×2): via INTRAVENOUS

## 2016-05-21 MED ORDER — 0.9 % SODIUM CHLORIDE (POUR BTL) OPTIME
TOPICAL | Status: DC | PRN
  Administered 2016-05-21: 1000 mL

## 2016-05-21 SURGICAL SUPPLY — 62 items
APL SKNCLS STERI-STRIP NONHPOA (GAUZE/BANDAGES/DRESSINGS) ×1
BENZOIN TINCTURE PRP APPL 2/3 (GAUZE/BANDAGES/DRESSINGS) ×2 IMPLANT
BLADE SAW SGTL 83.5X18.5 (BLADE) ×2 IMPLANT
BOWL SMART MIX CTS (DISPOSABLE) ×1 IMPLANT
BRUSH FEMORAL CANAL (MISCELLANEOUS) IMPLANT
BUR SURG 4X8 MED (BURR) IMPLANT
BURR SURG 4X8 MED (BURR)
CAPT SHLDR TOTAL 2 ×1 IMPLANT
CEMENT BONE SIMPLEX SPEEDSET (Cement) ×2 IMPLANT
CLSR STERI-STRIP ANTIMIC 1/2X4 (GAUZE/BANDAGES/DRESSINGS) ×1 IMPLANT
COVER SURGICAL LIGHT HANDLE (MISCELLANEOUS) ×2 IMPLANT
DRAPE IMP U-DRAPE 54X76 (DRAPES) ×2 IMPLANT
DRAPE ORTHO SPLIT 77X108 STRL (DRAPES) ×4
DRAPE SURG ORHT 6 SPLT 77X108 (DRAPES) ×2 IMPLANT
DRAPE U-SHAPE 47X51 STRL (DRAPES) ×2 IMPLANT
DRSG AQUACEL AG ADV 3.5X 6 (GAUZE/BANDAGES/DRESSINGS) ×1 IMPLANT
DRSG AQUACEL AG ADV 3.5X10 (GAUZE/BANDAGES/DRESSINGS) ×2 IMPLANT
DURAPREP 26ML APPLICATOR (WOUND CARE) ×2 IMPLANT
ELECT CAUTERY BLADE 6.4 (BLADE) ×2 IMPLANT
ELECT REM PT RETURN 9FT ADLT (ELECTROSURGICAL) ×2
ELECTRODE REM PT RTRN 9FT ADLT (ELECTROSURGICAL) ×1 IMPLANT
EVACUATOR 1/8 PVC DRAIN (DRAIN) IMPLANT
FACESHIELD WRAPAROUND (MASK) ×4 IMPLANT
FACESHIELD WRAPAROUND OR TEAM (MASK) ×2 IMPLANT
GLOVE BIOGEL PI IND STRL 7.0 (GLOVE) ×1 IMPLANT
GLOVE BIOGEL PI INDICATOR 7.0 (GLOVE) ×1
GLOVE ECLIPSE 7.0 STRL STRAW (GLOVE) ×2 IMPLANT
GLOVE ORTHO TXT STRL SZ7.5 (GLOVE) ×2 IMPLANT
GOWN STRL REUS W/ TWL LRG LVL3 (GOWN DISPOSABLE) ×2 IMPLANT
GOWN STRL REUS W/TWL LRG LVL3 (GOWN DISPOSABLE) ×8
HANDPIECE INTERPULSE COAX TIP (DISPOSABLE)
KIT BASIN OR (CUSTOM PROCEDURE TRAY) ×2 IMPLANT
KIT ROOM TURNOVER OR (KITS) ×2 IMPLANT
MANIFOLD NEPTUNE II (INSTRUMENTS) ×2 IMPLANT
NDL HYPO 25GX1X1/2 BEV (NEEDLE) ×1 IMPLANT
NEEDLE HYPO 25GX1X1/2 BEV (NEEDLE) IMPLANT
NS IRRIG 1000ML POUR BTL (IV SOLUTION) ×2 IMPLANT
PACK SHOULDER (CUSTOM PROCEDURE TRAY) ×2 IMPLANT
PAD ARMBOARD 7.5X6 YLW CONV (MISCELLANEOUS) ×3 IMPLANT
SET HNDPC FAN SPRY TIP SCT (DISPOSABLE) IMPLANT
SLING ARM IMMOBILIZER LRG (SOFTGOODS) IMPLANT
SLING ARM IMMOBILIZER XL (CAST SUPPLIES) ×1 IMPLANT
SPONGE LAP 18X18 X RAY DECT (DISPOSABLE) ×2 IMPLANT
STRIP CLOSURE SKIN 1/2X4 (GAUZE/BANDAGES/DRESSINGS) ×2 IMPLANT
SUCTION FRAZIER HANDLE 10FR (MISCELLANEOUS) ×1
SUCTION TUBE FRAZIER 10FR DISP (MISCELLANEOUS) ×1 IMPLANT
SUT FIBERWIRE #2 38 REV NDL BL (SUTURE) ×6
SUT MNCRL AB 4-0 PS2 18 (SUTURE) ×2 IMPLANT
SUT MON AB 2-0 CT1 36 (SUTURE) ×2 IMPLANT
SUT SILK 2 0 TIES 10X30 (SUTURE) ×1 IMPLANT
SUT SILK 3 0 (SUTURE) ×2
SUT SILK 3-0 18XBRD TIE 12 (SUTURE) IMPLANT
SUT VIC AB 0 CT1 27 (SUTURE) ×2
SUT VIC AB 0 CT1 27XBRD ANBCTR (SUTURE) ×1 IMPLANT
SUT VIC AB 2-0 CT1 27 (SUTURE) ×2
SUT VIC AB 2-0 CT1 TAPERPNT 27 (SUTURE) ×1 IMPLANT
SUTURE FIBERWR#2 38 REV NDL BL (SUTURE) ×2 IMPLANT
SYR CONTROL 10ML LL (SYRINGE) ×2 IMPLANT
TOWEL OR 17X24 6PK STRL BLUE (TOWEL DISPOSABLE) ×2 IMPLANT
TOWEL OR 17X26 10 PK STRL BLUE (TOWEL DISPOSABLE) ×2 IMPLANT
TRAY CATH 16FR W/PLASTIC CATH (SET/KITS/TRAYS/PACK) ×1 IMPLANT
WATER STERILE IRR 1000ML POUR (IV SOLUTION) ×1 IMPLANT

## 2016-05-21 NOTE — Transfer of Care (Signed)
Immediate Anesthesia Transfer of Care Note  Patient: Karla Willis  Procedure(s) Performed: Procedure(s): RIGHT TOTAL SHOULDER ARTHROPLASTY (Right)  Patient Location: PACU  Anesthesia Type:General and Regional  Level of Consciousness: awake, alert , oriented and patient cooperative  Airway & Oxygen Therapy: Patient Spontanous Breathing and Patient connected to face mask oxygen  Post-op Assessment: Report given to RN, Post -op Vital signs reviewed and stable, Patient moving all extremities X 4 and Patient able to stick tongue midline  Post vital signs: Reviewed and stable  Last Vitals:  Filed Vitals:   05/21/16 0652  BP: 127/59  Pulse: 75  Temp: 37.2 C  Resp: 20    Last Pain: There were no vitals filed for this visit.    Patients Stated Pain Goal: 3 ([id number])  Complications: No apparent anesthesia complications

## 2016-05-21 NOTE — Anesthesia Procedure Notes (Addendum)
Anesthesia Regional Block:  Interscalene brachial plexus block  Pre-Anesthetic Checklist: ,, timeout performed, Correct Patient, Correct Site, Correct Laterality, Correct Procedure, Correct Position, site marked, Risks and benefits discussed,  Surgical consent,  Pre-op evaluation,  At surgeon's request and post-op pain management  Laterality: Right  Prep: chloraprep       Needles:  Injection technique: Single-shot  Needle Type: Echogenic Needle     Needle Length: 9cm 9 cm Needle Gauge: 21 and 21 G    Additional Needles:  Procedures: ultrasound guided (picture in chart) Interscalene brachial plexus block Narrative:   Performed by: Personally  Anesthesiologist: ROSE, GEORGE  Additional Notes: Patient tolerated the procedure well without complications   Procedure Name: Intubation Date/Time: 05/21/2016 8:42 AM Performed by: Collier Bullock Pre-anesthesia Checklist: Patient identified, Emergency Drugs available, Suction available and Patient being monitored Patient Re-evaluated:Patient Re-evaluated prior to inductionOxygen Delivery Method: Circle system utilized Preoxygenation: Pre-oxygenation with 100% oxygen Intubation Type: IV induction Ventilation: Oral airway inserted - appropriate to patient size and Mask ventilation with difficulty Laryngoscope Size: Mac and 3 Grade View: Grade III Tube type: Oral Tube size: 7.0 mm Number of attempts: 1 Airway Equipment and Method: Stylet Placement Confirmation: positive ETCO2 and breath sounds checked- equal and bilateral Secured at: 22 cm Tube secured with: Tape Dental Injury: Teeth and Oropharynx as per pre-operative assessment

## 2016-05-21 NOTE — Discharge Instructions (Signed)
Total shoulder replacement Care After Instructions Refer to this sheet in the next few weeks. These discharge instructions provide you with general information on caring for yourself after you leave the hospital. Your caregiver may also give you specific instructions. Your treatment has been planned according to the most current medical practices available, but unavoidable complications sometimes occur. If you have any problems or questions after discharge, please call your caregiver. HOME INSTRUCTIONS You may resume a normal diet and activities as directed.  Take showers instead of baths until informed otherwise.  Do not remove bandage Only take over-the-counter or prescription medicines for pain, discomfort, or fever as directed by your caregiver.  Wear your sling for the next 6 weeks unless otherwise instructed. Eat a well-balanced diet.  Avoid lifting or driving until you are instructed otherwise.  Make an appointment to see your caregiver for stitches (suture) or staple removal one week after surgery.   SEEK MEDICAL CARE IF: You have swelling of your calf or leg.  You develop shortness of breath or chest pain.  You have redness, swelling, or increasing pain in the wound.  There is pus or any unusual drainage coming from the surgical site.  You notice a bad smell coming from the surgical site or dressing.  The surgical site breaks open after sutures or staples have been removed.  There is persistent bleeding from the suture or staple line.  You are getting worse or are not improving.  You have any other questions or concerns.  SEEK IMMEDIATE MEDICAL CARE IF:  You have a fever greater than 101 You develop a rash.  You have difficulty breathing.  You develop any reaction or side effects to medicines given.  Your knee motion is decreasing rather than improving.  MAKE SURE YOU:  Understand these instructions.  Will watch your condition.  Will get help right away if you are not doing  well or get worse.

## 2016-05-21 NOTE — Discharge Summary (Addendum)
Patient ID: Karla Willis MRN: [id number] DOB/AGE: 1935/02/17 81 y.o.  Admit date: 05/21/2016 Discharge date: 05/22/2016  Admission Diagnoses:  Active Problems:   Localized primary osteoarthritis of right shoulder region   Discharge Diagnoses:  Same  Past Medical History  Diagnosis Date  . Allergy   . Cancer ([place])   . Diabetes mellitus without complication ([place])   . Thyroid disease   . Hypertension   . Wears glasses   . HOH (hard of hearing)   . Arthritis   . Gout   . 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 Pneumonia     hx  . Hypothyroidism   . GERD (gastroesophageal reflux disease)     occ  . History of hiatal hernia     Surgeries: Procedure(s): RIGHT TOTAL SHOULDER ARTHROPLASTY on 05/21/2016   Consultants:    Discharged Condition: Improved  Hospital Course: FAHIMA ALCOTT is an 81 y.o. female who was admitted 05/21/2016 for operative treatment of primary localized osteoarthritis right shoulder. Patient has severe unremitting pain that affects sleep, daily activities, and work/hobbies. After pre-op clearance the patient was taken to the operating room on 05/21/2016 and underwent  Procedure(s): RIGHT TOTAL SHOULDER ARTHROPLASTY.    Patient was given perioperative antibiotics:      Anti-infectives    Start     Dose/Rate Route Frequency Ordered Stop   05/21/16 1500  ceFAZolin (ANCEF) IVPB 2g/100 mL premix     2 g 200 mL/hr over 30 Minutes Intravenous Every 6 hours 05/21/16 1255 05/22/16 0453   05/21/16 0800  ceFAZolin (ANCEF) IVPB 2g/100 mL premix     2 g 200 mL/hr over 30 Minutes Intravenous To ShortStay Surgical 05/20/16 1043 05/21/16 0839       Patient was given sequential compression devices, early ambulation, and chemoprophylaxis to prevent DVT.  Patient benefited maximally from hospital stay and there were no complications.    Recent vital signs:  Patient Vitals for the past 24 hrs:  BP Temp Temp src Pulse Resp SpO2  05/22/16  0457 (!) 101/39 mmHg 99.9 F (37.7 C) - 82 17 99 %  05/22/16 0009 (!) 118/55 mmHg 99.2 F (37.3 C) Oral 79 16 100 %  05/21/16 2030 (!) 125/57 mmHg 99.6 F (37.6 C) Oral 66 16 98 %  05/21/16 1300 (!) 101/55 mmHg 97.5 F (36.4 C) - 60 - 98 %  05/21/16 1240 (!) 101/57 mmHg 97.5 F (36.4 C) - 63 16 100 %  05/21/16 1230 (!) 101/54 mmHg - - 61 11 100 %  05/21/16 1215 (!) 105/54 mmHg - - 64 11 100 %  05/21/16 1200 (!) 99/55 mmHg - - (!) 57 11 100 %  05/21/16 1145 (!) 91/49 mmHg - - (!) 53 (!) 9 100 %  05/21/16 1130 (!) 77/43 mmHg - - (!) 58 19 100 %  05/21/16 1115 (!) 71/42 mmHg - - 64 15 100 %  05/21/16 1105 (!) 95/52 mmHg 97.5 F (36.4 C) - 78 14 100 %     Recent laboratory studies: No results for input(s): WBC, HGB, HCT, PLT, NA, K, CL, CO2, BUN, CREATININE, GLUCOSE, INR, CALCIUM in the last 72 hours.  Invalid input(s): PT, 2   Discharge Medications:     Medication List    STOP taking these medications        allopurinol 100 MG tablet  Commonly known as:  ZYLOPRIM     traMADol 50 MG tablet  Commonly known as:  ULTRAM      TAKE these medications        amLODipine 5 MG tablet  Commonly known as:  NORVASC  take 1 tablet by mouth once daily     glipiZIDE 5 MG 24 hr tablet  Commonly known as:  GLUCOTROL XL  Take 1 tablet (5 mg total) by mouth daily with breakfast.     HYDROmorphone 2 MG tablet  Commonly known as:  DILAUDID  Take 1-2 tabs po q4-6 hours prn pain     levothyroxine 100 MCG tablet  Commonly known as:  SYNTHROID, LEVOTHROID  TAKE 1 TABLET BY MOUTH ONCE DAILY BEFORE BREAKFAST     lisinopril-hydrochlorothiazide 20-12.5 MG tablet  Commonly known as:  PRINZIDE,ZESTORETIC  take 1 tablet by mouth once daily     ondansetron 4 MG tablet  Commonly known as:  ZOFRAN  Take 1 tablet (4 mg total) by mouth every 8 (eight) hours as needed for nausea or vomiting.     tamoxifen 20 MG tablet  Commonly known as:  NOLVADEX  Take 1 tablet (20 mg total) by mouth daily.         Diagnostic Studies: Dg Shoulder Right Port  2016/06/11  CLINICAL DATA:  Right shoulder arthroplasty. EXAM: PORTABLE RIGHT SHOULDER - 2+ VIEW COMPARISON:  04/10/2016 MR. FINDINGS: Right shoulder arthroplasty changes identified. No complicating features are noted. Soft tissue postoperative changes are present. IMPRESSION: Right shoulder arthroplasty changes without complicating features. Electronically Signed   By: Margarette Canada M.D.   On: 06-11-16 14:28    Disposition: 01-Home or Self Care    Follow-up Information    Follow up with Ninetta Lights, MD. Schedule an appointment as soon as possible for a visit in 1 week.   Specialty:  Orthopedic Surgery   Contact information:   [address] [place] [address] [phone]        Signed: Fannie Knee 05/22/2016, 7:02 AM

## 2016-05-21 NOTE — Anesthesia Preprocedure Evaluation (Signed)
Anesthesia Evaluation  Patient identified by MRN, date of birth, ID band Patient awake    Reviewed: Allergy & Precautions, NPO status , Patient's Chart, lab work & pertinent test results  Airway Mallampati: II  TM Distance: <3 FB Neck ROM: Full    Dental no notable dental hx.    Pulmonary neg pulmonary ROS,    Pulmonary exam normal breath sounds clear to auscultation       Cardiovascular hypertension, Normal cardiovascular exam Rhythm:Regular Rate:Normal     Neuro/Psych negative neurological ROS  negative psych ROS   GI/Hepatic negative GI ROS, Neg liver ROS,   Endo/Other  diabetesHypothyroidism Morbid obesity  Renal/GU negative Renal ROS  negative genitourinary   Musculoskeletal negative musculoskeletal ROS (+)   Abdominal   Peds negative pediatric ROS (+)  Hematology negative hematology ROS (+)   Anesthesia Other Findings   Reproductive/Obstetrics negative OB ROS                             Anesthesia Physical Anesthesia Plan  ASA: III  Anesthesia Plan: General   Post-op Pain Management: GA combined w/ Regional for post-op pain   Induction: Intravenous  Airway Management Planned: Oral ETT  Additional Equipment:   Intra-op Plan:   Post-operative Plan: Extubation in OR  Informed Consent: I have reviewed the patients History and Physical, chart, labs and discussed the procedure including the risks, benefits and alternatives for the proposed anesthesia with the patient or authorized representative who has indicated his/her understanding and acceptance.   Dental advisory given  Plan Discussed with: CRNA and Surgeon  Anesthesia Plan Comments:         Anesthesia Quick Evaluation

## 2016-05-21 NOTE — Interval H&P Note (Signed)
History and Physical Interval Note:  05/21/2016 8:27 AM  Karla Willis  has presented today for surgery, with the diagnosis of djd right shoulder  The various methods of treatment have been discussed with the patient and family. After consideration of risks, benefits and other options for treatment, the patient has consented to  Procedure(s): RIGHT TOTAL SHOULDER ARTHROPLASTY (Right) as a surgical intervention .  The patient's history has been reviewed, patient examined, no change in status, stable for surgery.  I have reviewed the patient's chart and labs.  Questions were answered to the patient's satisfaction.     Ninetta Lights

## 2016-05-22 ENCOUNTER — Encounter (HOSPITAL_COMMUNITY): Payer: Self-pay | Admitting: Orthopedic Surgery

## 2016-05-22 LAB — BASIC METABOLIC PANEL
Anion gap: 6 (ref 5–15)
BUN: 13 mg/dL (ref 6–20)
CO2: 24 mmol/L (ref 22–32)
Calcium: 8.3 mg/dL — ABNORMAL LOW (ref 8.9–10.3)
Chloride: 104 mmol/L (ref 101–111)
Creatinine, Ser: 1.16 mg/dL — ABNORMAL HIGH (ref 0.44–1.00)
GFR calc Af Amer: 51 mL/min — ABNORMAL LOW (ref >60)
GFR calc non Af Amer: 44 mL/min — ABNORMAL LOW (ref >60)
Glucose, Bld: 140 mg/dL — ABNORMAL HIGH (ref 65–99)
Potassium: 3.7 mmol/L (ref 3.5–5.1)
Sodium: 134 mmol/L — ABNORMAL LOW (ref 135–145)

## 2016-05-22 LAB — CBC
HCT: 25.3 % — ABNORMAL LOW (ref 36.0–46.0)
Hemoglobin: 8.6 g/dL — ABNORMAL LOW (ref 12.0–15.0)
MCH: 28.7 pg (ref 26.0–34.0)
MCHC: 34 g/dL (ref 30.0–36.0)
MCV: 84.3 fL (ref 78.0–100.0)
Platelets: 161 10*3/uL (ref 150–400)
RBC: 3 MIL/uL — ABNORMAL LOW (ref 3.87–5.11)
RDW: 13.9 % (ref 11.5–15.5)
WBC: 8.3 10*3/uL (ref 4.0–10.5)

## 2016-05-22 LAB — GLUCOSE, CAPILLARY
Glucose-Capillary: 151 mg/dL — ABNORMAL HIGH (ref 65–99)
Glucose-Capillary: 142 mg/dL — ABNORMAL HIGH (ref 65–99)

## 2016-05-22 NOTE — Care Management Important Message (Signed)
Important Message  Patient Details  Name: Karla Willis MRN: [id number] Date of Birth: 1935-06-27   Medicare Important Message Given:  Yes    Loann Quill 05/22/2016, 8:22 AM

## 2016-05-22 NOTE — Evaluation (Signed)
Occupational Therapy Evaluation Patient Details Name: Karla Willis MRN: [id number] DOB: 1934-11-15 Today's Date: 05/22/2016    History of Present Illness Right shoulder total shoulder replacement. Pt reports she has a bad hip on left and bad knee on right   Clinical Impression   This 81 yo female admitted and underwent above presents to acute OT with deficits below (see OT problem list) thus affecting her PLOF of Independent to Mod I with all basic ADLs. She will benefit from acute OT with follow up [place] and HHPT to get back to PLOF.    Follow Up Recommendations  Home health OT;Other (comment) (HHPT as well)          Precautions / Restrictions Precautions Precautions: Fall;Shoulder Shoulder Interventions: Shoulder sling/immobilizer;Off for dressing/bathing/exercises Precaution Comments: No PROM/AROM of shoulder; No elbow, wrist, or hand ROM, no pendulums, shower in sling Restrictions Weight Bearing Restrictions: Yes RUE Weight Bearing: Non weight bearing      Mobility Bed Mobility Overal bed mobility: Needs Assistance Bed Mobility: Supine to Sit     Supine to sit: Mod assist;HOB elevated (and use of rail)        Transfers Overall transfer level: Needs assistance Equipment used: 1 person hand held assist Transfers: Sit to/from Stand Sit to Stand: Min assist              Balance Overall balance assessment: Needs assistance Sitting-balance support: No upper extremity supported;Feet supported Sitting balance-Leahy Scale: Fair     Standing balance support: Bilateral upper extremity supported Standing balance-Leahy Scale: Poor Standing balance comment: reliant on HHA  on one side                            ADL Overall ADL's : Needs assistance/impaired                         Toilet Transfer: Minimal assistance;Ambulation (+1 HHA, 5 feet bed>recliner)                             Pertinent Vitals/Pain Pain Assessment:  0-10 Pain Score: 6  Pain Location: right hand more than shoulder Pain Descriptors / Indicators: Sharp;Shooting (intermittently--nothing in particular sets it off) Pain Intervention(s): Monitored during session;Repositioned;Premedicated before session     Hand Dominance Right   Extremity/Trunk Assessment Upper Extremity Assessment Upper Extremity Assessment: RUE deficits/detail RUE Deficits / Details: shoulder sx this admission, moves hand within constraints of sling--no other ROM allowed at  this time per orders RUE Coordination: decreased gross motor           Communication Communication Communication: No difficulties   Cognition Arousal/Alertness: Awake/alert Behavior During Therapy: WFL for tasks assessed/performed Overall Cognitive Status: Within Functional Limits for tasks assessed                           Shoulder Instructions Shoulder Instructions Pendulum exercises (written home exercise program):  (NA) Dressing change:  (NA)    Home Living Family/patient expects to be discharged to:: Private residence Living Arrangements: Children (works 4pm-12 am) Available Help at Discharge: Family Type of Home: House Home Access: Stairs to enter           Bathroom Shower/Tub: Tub/shower unit;Curtain Shower/tub characteristics: [place]: Environmental consultant - 2 wheels;Cane - single point (lift chair)  Prior Functioning/Environment Level of Independence: Independent             OT Diagnosis: Generalized weakness;Acute pain   OT Problem List: Decreased strength;Decreased range of motion;Impaired balance (sitting and/or standing);Pain;Impaired UE functional use;Obesity;Decreased knowledge of use of DME or AE;Decreased knowledge of precautions   OT Treatment/Interventions: Self-care/ADL training;Patient/family education;Balance training;Therapeutic activities;DME and/or AE instruction    OT Goals(Current goals can be found in the care  plan section) Acute Rehab OT Goals Patient Stated Goal: to go home today OT Goal Formulation: With patient Time For Goal Achievement: 05/29/16 Potential to Achieve Goals: Good  OT Frequency: Min 2X/week              End of Session Equipment Utilized During Treatment: Gait belt (immoblizer) Nurse Communication:  (need for PT consult prior to D/C and another sling for showering)  Activity Tolerance: Patient tolerated treatment well Patient left: in chair;with call bell/phone within reach   Time: 0816-0835 OT Time Calculation (min): 19 min Charges:  OT General Charges $OT Visit: 1 Procedure OT Evaluation $OT Eval Moderate Complexity: 1 Procedure  Almon Register [id number] 05/22/2016, 9:34 AM

## 2016-05-22 NOTE — Evaluation (Signed)
Physical Therapy Evaluation Patient Details Name: Karla Willis MRN: [id number] DOB: December 19, 1934 Today's Date: 05/22/2016   History of Present Illness  Right shoulder total shoulder replacement. Pt reports she has a bad hip on left and bad knee on right.  pt with hx of HTN, DM, and Breast CA.    Clinical Impression  Pt very motivated to work on her mobility and return to her independence.  Recommend that pt continues to use a cane for ambulation and balance.  Feel pt is ready for D/C to home from PT stand point.  Will sign off.      Follow Up Recommendations Home health PT;Supervision - Intermittent    Equipment Recommendations  Cane    Recommendations for Other Services       Precautions / Restrictions Precautions Precautions: Fall;Shoulder Shoulder Interventions: Shoulder sling/immobilizer;Off for dressing/bathing/exercises (except on to shower, off to sponge bath) Precaution Comments: No PROM/AROM of shoulder; No elbow, wrist, or hand ROM, no pendulums, shower in sling Required Braces or Orthoses: Sling Restrictions Weight Bearing Restrictions: Yes RUE Weight Bearing: Non weight bearing      Mobility  Bed Mobility Overal bed mobility: Needs Assistance Bed Mobility: Supine to Sit     Supine to sit: Mod assist;HOB elevated (and use of rail)     General bed mobility comments: pt up in recliner upon arrival  Transfers Overall transfer level: Needs assistance Equipment used: Straight cane Transfers: Sit to/from Stand Sit to Stand: Supervision         General transfer comment: pt moves slowly and with strong anterior weight shift.  Ambulation/Gait Ambulation/Gait assistance: Supervision Ambulation Distance (Feet): 100 Feet Assistive device: Straight cane Gait Pattern/deviations: Step-through pattern     General Gait Details: pt moves slowly and with mild bil sway, though this seems to be baseline due to body habitus.    Stairs Stairs: Yes Stairs  assistance: Min guard Stair Management: One rail Left;Step to pattern;Forwards;With cane Number of Stairs: 12 General stair comments: pt able to perform stairs with single rail and then descend with a cane.  No physical A needed, but cues for pt and daughter for safe technique.    Wheelchair Mobility    Modified Rankin (Stroke Patients Only)       Balance Overall balance assessment: Needs assistance Sitting-balance support: No upper extremity supported;Feet supported Sitting balance-Leahy Scale: Good     Standing balance support: Single extremity supported;No upper extremity supported;During functional activity Standing balance-Leahy Scale: Fair Standing balance comment: reliant on LUE  on arm of recliner or on SPC                             Pertinent Vitals/Pain Pain Assessment: 0-10 Pain Score: 3  Pain Location: R shoulder Pain Descriptors / Indicators: Aching Pain Intervention(s): Monitored during session;Premedicated before session;Repositioned    Home Living Family/patient expects to be discharged to:: Private residence Living Arrangements: Children (2 daughters and 2 granddaughters.) Available Help at Discharge: Family;Available PRN/intermittently Type of Home: House Home Access: Stairs to enter Entrance Stairs-Rails: Psychiatric nurse of Steps: 8 Home Layout: Multi-level Home Equipment: Walker - 2 wheels;Cane - single point      Prior Function Level of Independence: Independent               Hand Dominance   Dominant Hand: Right    Extremity/Trunk Assessment   Upper Extremity Assessment: Defer to OT evaluation RUE Deficits / Details: shoulder sx  this admission, moves hand within constraints of sling--no other ROM allowed at  this time per orders         Lower Extremity Assessment: Generalized weakness      Cervical / Trunk Assessment: Normal  Communication   Communication: No difficulties  Cognition  Arousal/Alertness: Awake/alert Behavior During Therapy: WFL for tasks assessed/performed Overall Cognitive Status: Within Functional Limits for tasks assessed                      General Comments      Exercises Donning/doffing shirt without moving shoulder: Caregiver independent with task Method for sponge bathing under operated UE: Caregiver independent with task Donning/doffing sling/immobilizer: Caregiver independent with task Correct positioning of sling/immobilizer: Caregiver independent with task Pendulum exercises (written home exercise program):  (NA) ROM for elbow, wrist and digits of operated UE:  (NA) Sling wearing schedule (on at all times/off for ADL's): Caregiver independent with task Proper positioning of operated UE when showering: Caregiver independent with task Dressing change:  (NA) Positioning of UE while sleeping: Caregiver independent with task      Assessment/Plan    PT Assessment All further PT needs can be met in the next venue of care  PT Diagnosis Difficulty walking   PT Problem List Decreased strength;Decreased activity tolerance;Decreased balance;Decreased mobility;Decreased knowledge of use of DME  PT Treatment Interventions     PT Goals (Current goals can be found in the Care Plan section) Acute Rehab PT Goals Patient Stated Goal: to go home today PT Goal Formulation: All assessment and education complete, DC therapy    Frequency     Barriers to discharge        Co-evaluation               End of Session Equipment Utilized During Treatment: Gait belt Activity Tolerance: Patient tolerated treatment well Patient left: in chair;with call bell/phone within reach;with family/visitor present Nurse Communication: Mobility status         Time: 6226-3335 PT Time Calculation (min) (ACUTE ONLY): 21 min   Charges:   PT Evaluation $PT Eval Low Complexity: 1 Procedure     PT G CodesCatarina Hartshorn,  [place] 05/22/2016, 11:30 AM

## 2016-05-22 NOTE — Op Note (Signed)
NAMEAUDRA, HERT NO.:  [id number]  MEDICAL RECORD NO.:  [id number]  LOCATION:  5N02C                        FACILITY:  [facility]  PHYSICIAN:  Ninetta Lights, M.D. DATE OF BIRTH:  11-15-1934  DATE OF PROCEDURE:  05/21/2016 DATE OF DISCHARGE:                              OPERATIVE REPORT   PREOPERATIVE DIAGNOSES:  Right shoulder end-stage arthritis, primary localized.  Diffuse tendinopathy, rotator cuff, but no full-thickness tears.  POSTOPERATIVE DIAGNOSES:  Right shoulder end-stage arthritis, primary localized.  Diffuse tendinopathy, rotator cuff, but no full-thickness tears with considerable attrition, long head biceps tendon intra- articularly.  PROCEDURES:  Right shoulder total shoulder replacement utilizing Stryker prosthesis.  Cemented pegged 40-mm glenoid component.  Press-fit 14-mm stem with a 40 x 20 humeral head.  Resection of intra-articular portion of biceps with tenodesis in the bicipital groove.  SURGEON:  Ninetta Lights, M.D.  ASSISTANT:  Elmyra Ricks, PA, present throughout the entire case and necessary for timely completion of procedure.  ANESTHESIA:  General.  BLOOD LOSS:  100 mL.  BLOOD GIVEN:  None.  SPECIMENS:  None.  CULTURES:  None.  COMPLICATIONS:  None.  DRESSINGS:  Soft compressive shoulder immobilizer.  DESCRIPTION OF PROCEDURE:  The patient was brought to the operating room and placed on the operating table in supine position.  After adequate anesthesia had been obtained, placed in a beach-chair position. Shoulder positioner, prepped and draped in usual sterile fashion. Shoulder examined.  About 75% of motion limited by arthritis.  Anterior approach, deltopectoral.  Retractor put in place.  Conjoined tendon retracted.  Subscap taken down, retracted medially.  Marked grade 4 changes throughout.  Humeral head was cut at 30 degrees of retroversion, in line with definitive components.  Glenoid exposed.  The  biceps evaluated.  Intra-articular portion resected.  At the end of the case, I tenodesis within the bicipital groove utilizing the sutures, wire, repaired the subscap.  Although thinning throughout the rest of the cuff, there were no other full-thickness tears.  Extensive tearing labrum debrided.  Glenoid exposed.  Spurs removed.  Utilizing drills and reamers brought up to good bleeding bone, good positioning.  Drilled and fitted with a 40-mm glenoid component, which was firmly cemented in place.  Excessive cement removed.  Attention turned back to humerus. Everything done by hand.  Broaches and reamers bring it up to fitting with a 14-mm stem with a 40 x 20 head.  With this construct, good motion, good stability, good fitting throughout.  Definitive components were assembled after the trials removed.  The head attached, shoulder reduced.  Wound irrigated.  Subscap repaired anatomically with FiberWire incorporating the biceps tendon for tenodesis.  Wound irrigated. Deltopectoral interval closed.  Subcutaneous and subcuticular closure. Margins were injected with Marcaine.  Sterile compressive dressing applied.  Shoulder immobilizer applied.  Anesthesia reversed.  Brought to the recovery room.  Tolerated the surgery well.  No complications.     Ninetta Lights, M.D.     DFM/MEDQ  D:  05/21/2016  T:  05/22/2016  Job:  [id number]

## 2016-05-22 NOTE — Progress Notes (Signed)
Occupational Therapy Treatment and Discharge Patient Details Name: Karla Willis MRN: [id number] DOB: 1935-07-01 Today's Date: 05/22/2016    History of present illness Right shoulder total shoulder replacement. Pt reports she has a bad hip on left and bad knee on right   OT comments  This 81 yo female admitted and underwent above presents to acute OT with all education completed with pt and daughter. She will benefit from continued OT at home. We will D/C from acute OT due to pt to D/C today.  Follow Up Recommendations  Home health OT;Supervision/Assistance - 24 hour    Equipment Recommendations  3 in 1 bedside comode;Tub/shower bench;Other (comment) [facility])       Precautions / Restrictions Precautions Precautions: Fall;Shoulder Shoulder Interventions: Shoulder sling/immobilizer;Off for dressing/bathing/exercises (except on to shower, off to sponge bath) Precaution Comments: No PROM/AROM of shoulder; No elbow, wrist, or hand ROM, no pendulums, shower in sling Restrictions Weight Bearing Restrictions: Yes RUE Weight Bearing: Non weight bearing       Mobility Bed Mobility     General bed mobility comments: pt up in recliner upon arrival  Transfers Overall transfer level: Needs assistance Equipment used: 1 person hand held assist Transfers: Sit to/from Stand Sit to Stand: Min guard (increased time and readjustment to get legs repositioned from wide stance of getting up)              Balance Overall balance assessment: Needs assistance Sitting-balance support: No upper extremity supported;Feet supported Sitting balance-Leahy Scale: Good     Standing balance support: Single extremity supported Standing balance-Leahy Scale: Poor Standing balance comment: reliant on LUE  on arm of recliner or on SPC                   ADL Overall ADL's : Needs assistance/impaired                                                      Cognition    Behavior During Therapy: WFL for tasks assessed/performed Overall Cognitive Status: Within Functional Limits for tasks assessed                       Extremity/Trunk Assessment  Upper Extremity Assessment Upper Extremity Assessment: RUE deficits/detail RUE Deficits / Details: shoulder sx this admission, moves hand within constraints of sling--no other ROM allowed at  this time per orders RUE Coordination: decreased gross motor               Shoulder Instructions Shoulder Instructions Donning/doffing shirt without moving shoulder: Caregiver independent with task Method for sponge bathing under operated UE: Caregiver independent with task Donning/doffing sling/immobilizer: Caregiver independent with task Correct positioning of sling/immobilizer: Caregiver independent with task Pendulum exercises (written home exercise program):  (NA) ROM for elbow, wrist and digits of operated UE:  (NA) Sling wearing schedule (on at all times/off for ADL's): Caregiver independent with task Proper positioning of operated UE when showering: Caregiver independent with task Dressing change:  (NA) Positioning of UE while sleeping: Caregiver independent with task          Pertinent Vitals/ Pain       Pain Assessment: Faces Pain Score: 2  Pain Location: shoulder Pain Descriptors / Indicators: Aching;Sore Pain Intervention(s): Monitored during session;Repositioned  Home Living Family/patient expects to be discharged to:: Private  residence Living Arrangements: Children (works 4pm-12 am) Available Help at Discharge: Family Type of Home: House Home Access: Stairs to enter           Bathroom Shower/Tub: Tub/shower unit;Curtain Shower/tub characteristics: [place]: Environmental consultant - 2 wheels;Cane - single point (lift chair)          Prior Functioning/Environment Level of Independence: Independent            Frequency Min 2X/week     Progress Toward  Goals  OT Goals(current goals can now be found in the care plan section)  Progress towards OT goals: Goals met/education completed, patient discharged from OT  Acute Rehab OT Goals Patient Stated Goal: to go home today OT Goal Formulation: With patient Time For Goal Achievement: 05/29/16 Potential to Achieve Goals: Good ADL Goals Additional ADL Goal #1: Pt's caregiver will be independent with A'ing pt with basic  ADLs Additional ADL Goal #2: Pt's caregiver will understand proper positioning of pt in bed or recliner Additional ADL Goal #3: Pt's caregiver will be aware of precautions that pt is to follow Additional ADL Goal #4: Pt and caregiver will be aware of how to properly position slip  Plan Discharge plan remains appropriate       End of Session Equipment Utilized During Treatment: Gait belt (shoulder immobilizer)   Activity Tolerance Patient tolerated treatment well   Patient Left in chair;with call bell/phone within reach;with family/visitor present   Nurse Communication  (ready to go from OT standpoint;)        Time: 4383-8184 OT Time Calculation (min): 40 min  Charges: OT General Charges $OT Visit: 1 Procedure OT Evaluation $OT Eval Moderate Complexity: 1 Procedure OT Treatments $Self Care/Home Management : 38-52 mins  Almon Register [phone] 05/22/2016, 10:49 AM

## 2016-05-22 NOTE — Progress Notes (Signed)
Subjective: 1 Day Post-Op Procedure(s) (LRB): RIGHT TOTAL SHOULDER ARTHROPLASTY (Right) Patient reports pain as mild to shoulder.  Constant pain to right hand since immediate post-op period.  No nausea/vomiting, lightheadedness/dizziness, chest pain/sob.    Objective: Vital signs in last 24 hours: Temp:  [97.5 F (36.4 C)-99.9 F (37.7 C)] 99.9 F (37.7 C) (07/13 0457) Pulse Rate:  [53-82] 82 (07/13 0457) Resp:  [9-19] 17 (07/13 0457) BP: (71-125)/(39-57) 101/39 mmHg (07/13 0457) SpO2:  [98 %-100 %] 99 % (07/13 0457)  Intake/Output from previous day: 07/12 0701 - 07/13 0700 In: 3190 [P.O.:240; I.V.:2700; IV Piggyback:250] Out: 600 [Urine:500; Blood:100] Intake/Output this shift:     Recent Labs  05/22/16 0541  HGB 8.6*    Recent Labs  05/22/16 0541  WBC 8.3  RBC 3.00*  HCT 25.3*  PLT 161    Recent Labs  05/22/16 0541  NA 134*  K 3.7  CL 104  CO2 24  BUN 13  CREATININE 1.16*  GLUCOSE 140*  CALCIUM 8.3*   No results for input(s): LABPT, INR in the last 72 hours.  Neurologically intact Neurovascular intact Sensation intact distally Intact pulses distally Compartment soft  Right hand-no swelling, erythema or extreme ttp.  Full rom  Assessment/Plan: 1 Day Post-Op Procedure(s) (LRB): RIGHT TOTAL SHOULDER ARTHROPLASTY (Right) Advance diet Up with therapy D/C IV fluids  D/C home  Unsure pathology of hand pain, but should not be related to procedure.  Possibly related to block.  Continue with warm moist heat.   Fannie Knee 05/22/2016, 9:17 AM

## 2016-05-22 NOTE — Progress Notes (Signed)
Discharge instructions, RX's and follow up appts explained and provided to patient and daughter verbalized understanding. Home health set up for patient, patient left floor via wheelchair accompanied by volunteers no c/o pain or shortness of breath at d/c.  Athanasios Heldman, Tivis Ringer, RN

## 2016-05-22 NOTE — Progress Notes (Signed)
Orthopedic Tech Progress Note Patient Details:  Karla Willis [birth year] [id number]  Ortho Devices Type of Ortho Device: Arm sling Ortho Device/Splint Location: rue Ortho Device/Splint Interventions: [name], [name] 05/22/2016, 9:47 AM

## 2016-05-22 NOTE — Anesthesia Postprocedure Evaluation (Signed)
Anesthesia Post Note  Patient: Karla Willis  Procedure(s) Performed: Procedure(s) (LRB): RIGHT TOTAL SHOULDER ARTHROPLASTY (Right)  Patient location during evaluation: PACU Anesthesia Type: General and Regional Level of consciousness: awake and alert Pain management: pain level controlled Vital Signs Assessment: post-procedure vital signs reviewed and stable Respiratory status: spontaneous breathing, nonlabored ventilation, respiratory function stable and patient connected to nasal cannula oxygen Cardiovascular status: blood pressure returned to baseline and stable Postop Assessment: no signs of nausea or vomiting Anesthetic complications: no    Last Vitals:  Filed Vitals:   05/22/16 0009 05/22/16 0457  BP: 118/55 101/39  Pulse: 79 82  Temp: 37.3 C 37.7 C  Resp: 16 17    Last Pain:  Filed Vitals:   05/22/16 0458  PainSc: Asleep                 Brielyn Bosak S

## 2016-05-24 ENCOUNTER — Inpatient Hospital Stay (HOSPITAL_COMMUNITY)
Admission: EM | Admit: 2016-05-24 | Discharge: 2016-05-27 | DRG: 683 | Disposition: A | Discharge status: Home / Self Care | Payer: Medicare Other | Attending: Internal Medicine | Admitting: Internal Medicine

## 2016-05-24 ENCOUNTER — Encounter (HOSPITAL_COMMUNITY): Payer: Self-pay | Admitting: Emergency Medicine

## 2016-05-24 ENCOUNTER — Emergency Department (HOSPITAL_COMMUNITY): Payer: Medicare Other

## 2016-05-24 DIAGNOSIS — M25511 Pain in right shoulder: Secondary | ICD-10-CM | POA: Diagnosis present

## 2016-05-24 DIAGNOSIS — I129 Hypertensive chronic kidney disease with stage 1 through stage 4 chronic kidney disease, or unspecified chronic kidney disease: Secondary | ICD-10-CM | POA: Diagnosis present

## 2016-05-24 DIAGNOSIS — Z6841 Body Mass Index (BMI) 40.0 and over, adult: Secondary | ICD-10-CM

## 2016-05-24 DIAGNOSIS — I959 Hypotension, unspecified: Secondary | ICD-10-CM | POA: Diagnosis not present

## 2016-05-24 DIAGNOSIS — Z96611 Presence of right artificial shoulder joint: Secondary | ICD-10-CM | POA: Diagnosis present

## 2016-05-24 DIAGNOSIS — Z886 Allergy status to analgesic agent status: Secondary | ICD-10-CM | POA: Diagnosis not present

## 2016-05-24 DIAGNOSIS — E039 Hypothyroidism, unspecified: Secondary | ICD-10-CM | POA: Diagnosis present

## 2016-05-24 DIAGNOSIS — N189 Chronic kidney disease, unspecified: Secondary | ICD-10-CM | POA: Diagnosis present

## 2016-05-24 DIAGNOSIS — Z79899 Other long term (current) drug therapy: Secondary | ICD-10-CM

## 2016-05-24 DIAGNOSIS — C50311 Malignant neoplasm of lower-inner quadrant of right female breast: Secondary | ICD-10-CM | POA: Diagnosis present

## 2016-05-24 DIAGNOSIS — H9190 Unspecified hearing loss, unspecified ear: Secondary | ICD-10-CM | POA: Diagnosis present

## 2016-05-24 DIAGNOSIS — D638 Anemia in other chronic diseases classified elsewhere: Secondary | ICD-10-CM | POA: Diagnosis present

## 2016-05-24 DIAGNOSIS — R0609 Other forms of dyspnea: Secondary | ICD-10-CM | POA: Diagnosis not present

## 2016-05-24 DIAGNOSIS — N179 Acute kidney failure, unspecified: Principal | ICD-10-CM | POA: Diagnosis present

## 2016-05-24 DIAGNOSIS — R0602 Shortness of breath: Secondary | ICD-10-CM

## 2016-05-24 DIAGNOSIS — D649 Anemia, unspecified: Secondary | ICD-10-CM | POA: Diagnosis present

## 2016-05-24 DIAGNOSIS — D0511 Intraductal carcinoma in situ of right breast: Secondary | ICD-10-CM | POA: Diagnosis present

## 2016-05-24 DIAGNOSIS — Z91010 Allergy to peanuts: Secondary | ICD-10-CM | POA: Diagnosis not present

## 2016-05-24 DIAGNOSIS — Z888 Allergy status to other drugs, medicaments and biological substances status: Secondary | ICD-10-CM

## 2016-05-24 DIAGNOSIS — R39198 Other difficulties with micturition: Secondary | ICD-10-CM | POA: Diagnosis present

## 2016-05-24 DIAGNOSIS — E1122 Type 2 diabetes mellitus with diabetic chronic kidney disease: Secondary | ICD-10-CM | POA: Diagnosis present

## 2016-05-24 DIAGNOSIS — K5900 Constipation, unspecified: Secondary | ICD-10-CM | POA: Diagnosis not present

## 2016-05-24 DIAGNOSIS — Z885 Allergy status to narcotic agent status: Secondary | ICD-10-CM | POA: Diagnosis not present

## 2016-05-24 DIAGNOSIS — N19 Unspecified kidney failure: Secondary | ICD-10-CM

## 2016-05-24 DIAGNOSIS — R0600 Dyspnea, unspecified: Secondary | ICD-10-CM | POA: Diagnosis present

## 2016-05-24 DIAGNOSIS — Z79810 Long term (current) use of selective estrogen receptor modulators (SERMs): Secondary | ICD-10-CM

## 2016-05-24 DIAGNOSIS — Z833 Family history of diabetes mellitus: Secondary | ICD-10-CM

## 2016-05-24 DIAGNOSIS — E119 Type 2 diabetes mellitus without complications: Secondary | ICD-10-CM

## 2016-05-24 DIAGNOSIS — Z7984 Long term (current) use of oral hypoglycemic drugs: Secondary | ICD-10-CM

## 2016-05-24 DIAGNOSIS — Z66 Do not resuscitate: Secondary | ICD-10-CM | POA: Diagnosis present

## 2016-05-24 DIAGNOSIS — I10 Essential (primary) hypertension: Secondary | ICD-10-CM | POA: Diagnosis present

## 2016-05-24 LAB — COMPREHENSIVE METABOLIC PANEL
ALT: 16 U/L (ref 14–54)
AST: 32 U/L (ref 15–41)
Albumin: 2.4 g/dL — ABNORMAL LOW (ref 3.5–5.0)
Alkaline Phosphatase: 52 U/L (ref 38–126)
Anion gap: 10 (ref 5–15)
BUN: 30 mg/dL — ABNORMAL HIGH (ref 6–20)
CO2: 23 mmol/L (ref 22–32)
Calcium: 9.1 mg/dL (ref 8.9–10.3)
Chloride: 99 mmol/L — ABNORMAL LOW (ref 101–111)
Creatinine, Ser: 2.98 mg/dL — ABNORMAL HIGH (ref 0.44–1.00)
GFR calc Af Amer: 16 mL/min — ABNORMAL LOW (ref >60)
GFR calc non Af Amer: 14 mL/min — ABNORMAL LOW (ref >60)
Glucose, Bld: 123 mg/dL — ABNORMAL HIGH (ref 65–99)
Potassium: 4 mmol/L (ref 3.5–5.1)
Sodium: 132 mmol/L — ABNORMAL LOW (ref 135–145)
Total Bilirubin: 0.7 mg/dL (ref 0.3–1.2)
Total Protein: 5.7 g/dL — ABNORMAL LOW (ref 6.5–8.1)

## 2016-05-24 LAB — CBC WITH DIFFERENTIAL/PLATELET
Basophils Absolute: 0 10*3/uL (ref 0.0–0.1)
Basophils Relative: 0 %
Eosinophils Absolute: 0.1 10*3/uL (ref 0.0–0.7)
Eosinophils Relative: 1 %
HCT: 26 % — ABNORMAL LOW (ref 36.0–46.0)
Hemoglobin: 9.1 g/dL — ABNORMAL LOW (ref 12.0–15.0)
Lymphocytes Relative: 9 %
Lymphs Abs: 1.1 10*3/uL (ref 0.7–4.0)
MCH: 29.1 pg (ref 26.0–34.0)
MCHC: 35 g/dL (ref 30.0–36.0)
MCV: 83.1 fL (ref 78.0–100.0)
Monocytes Absolute: 1.7 10*3/uL — ABNORMAL HIGH (ref 0.1–1.0)
Monocytes Relative: 14 %
Neutro Abs: 9 10*3/uL — ABNORMAL HIGH (ref 1.7–7.7)
Neutrophils Relative %: 76 %
Platelets: 201 10*3/uL (ref 150–400)
RBC: 3.13 MIL/uL — ABNORMAL LOW (ref 3.87–5.11)
RDW: 14 % (ref 11.5–15.5)
WBC: 11.9 10*3/uL — ABNORMAL HIGH (ref 4.0–10.5)

## 2016-05-24 MED ORDER — SODIUM CHLORIDE 0.9 % IV BOLUS (SEPSIS)
1000.0000 mL | Freq: Once | INTRAVENOUS | Status: AC
  Administered 2016-05-25: 1000 mL via INTRAVENOUS

## 2016-05-24 NOTE — ED Notes (Signed)
Pt had right shoulder surg on Wed.   St's she has not had a BM in 6 days  Also has not urinated since 2am today.  Pt denies any nausea or vomiting

## 2016-05-24 NOTE — ED Provider Notes (Signed)
CSN: [id number]     Arrival date & time 05/24/16  2004 History   First MD Initiated Contact with Patient 05/24/16 2211     Chief Complaint  Patient presents with  . Post-op Problem     (Consider location/radiation/quality/duration/timing/severity/associated sxs/prior Treatment) HPI Karla Willis is a 81 y.o. female with hx of DM, HTN, presents To emergency department complaining of difficulty urinating, constipation, pain and swelling in her legs, and intermittent headaches. Patient states that she was discharged from hospital 2 days ago after she underwent a right shoulder replacement surgery. She states while in the hospital she was able to urinate and had a bowel movement prior to discharge. She states she hasn't been able to have a bowel movement since then and she has only been urinating about twice a day. She states that she has a urge to urinate and feels like her bladder is full but states when she goes only small dribbles come out. Patient was started on Dilaudid after surgery. She states she was taking tramadol prior to her surgery. She takes 2-4 mg of Dilaudid every 4 hours for her pain. She states her shoulder pain is managed. She also noticed swelling in her both legs which is new, and reports pain in bilateral lower legs. She also has had intermittent headache, denies any headache at this time. States each time headache improved after applying ice pack. Pain was in the back of the head. Patient denies any fever or chills. She denies any abdominal pain. She denies any chest pain or shortness of breath. She has no other complaints at this time.  Past Medical History  Diagnosis Date  . Allergy   . Cancer ([place])   . Diabetes mellitus without complication ([place])   . Thyroid disease   . Hypertension   . Wears glasses   . HOH (hard of hearing)   . Arthritis   . Gout   . 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 Pneumonia     hx  . Hypothyroidism   . GERD  (gastroesophageal reflux disease)     occ  . History of hiatal hernia    Past Surgical History  Procedure Laterality Date  . Gallbladder sugery    . Thyroid surgery  1970  . Cholecystectomy  1970  . Tubal ligation    . Finger surgery      right hand-  . Shoulder arthroscopy  2011    left  . Colonoscopy    . Breast lumpectomy with needle localization Right 12/16/2013    Procedure: BREAST LUMPECTOMY WITH NEEDLE LOCALIZATION;  Surgeon: Edward Jolly, MD;  Location: [place];  Service: General;  Laterality: Right;  . Breast biopsy Right     remote, benign  . Re-excision of breast lumpectomy Right 01/30/2014    Procedure: RE-EXCISION OF BREAST LUMPECTOMY;  Surgeon: Edward Jolly, MD;  Location: [place];  Service: General;  Laterality: Right;  . Re-excision of breast lumpectomy Right 03/06/2014    Procedure: RE-EXCISION OF BREAST LUMPECTOMY;  Surgeon: Edward Jolly, MD;  Location: [place];  Service: General;  Laterality: Right;  . Total shoulder arthroplasty Right 05/21/2016  . Total shoulder arthroplasty Right 05/21/2016    Procedure: RIGHT TOTAL SHOULDER ARTHROPLASTY;  Surgeon: Ninetta Lights, MD;  Location: [place];  Service: Orthopedics;  Laterality: Right;   Family History  Problem Relation Age of Onset  . Seizures Sister   .  Cancer Sister     breast  . Multiple sclerosis Son     Deceased   . Arthritis Father   . Diabetes Mother    Social History  Substance Use Topics  . Smoking status: Never Smoker   . Smokeless tobacco: Never Used  . Alcohol Use: No   OB History    No data available     Review of Systems  Constitutional: Negative for fever and chills.  Respiratory: Negative for cough, chest tightness and shortness of breath.   Cardiovascular: Positive for leg swelling. Negative for chest pain and palpitations.  Gastrointestinal: Positive for constipation. Negative for nausea, vomiting, abdominal pain  and diarrhea.  Genitourinary: Positive for difficulty urinating. Negative for dysuria, frequency and flank pain.  Musculoskeletal: Negative for myalgias, arthralgias, neck pain and neck stiffness.  Skin: Negative for rash.  Neurological: Positive for headaches. Negative for dizziness and weakness.  All other systems reviewed and are negative.     Allergies  Actos; Hydrocodone; Codeine; Neurontin; Other; and Peanut-containing drug products  Home Medications   Prior to Admission medications   Medication Sig Start Date End Date Taking? Authorizing Provider  amLODipine (NORVASC) 5 MG tablet take 1 tablet by mouth once daily 01/21/16  Yes Gildardo Cranker, DO  glipiZIDE (GLUCOTROL XL) 5 MG 24 hr tablet Take 1 tablet (5 mg total) by mouth daily with breakfast. 01/11/16  Yes Gildardo Cranker, DO  HYDROmorphone (DILAUDID) 2 MG tablet Take 1-2 tabs po q4-6 hours prn pain Patient taking differently: Take 2-4 mg by mouth every 4 (four) hours as needed for moderate pain.  05/21/16  Yes Aundra Dubin, PA-C  levothyroxine (SYNTHROID, LEVOTHROID) 100 MCG tablet TAKE 1 TABLET BY MOUTH ONCE DAILY BEFORE BREAKFAST 04/08/16  Yes Gildardo Cranker, DO  lisinopril-hydrochlorothiazide (PRINZIDE,ZESTORETIC) 20-12.5 MG tablet take 1 tablet by mouth once daily 01/21/16  Yes Monica Carter, DO  ondansetron (ZOFRAN) 4 MG tablet Take 1 tablet (4 mg total) by mouth every 8 (eight) hours as needed for nausea or vomiting. 05/21/16  Yes Aundra Dubin, PA-C  tamoxifen (NOLVADEX) 20 MG tablet Take 1 tablet (20 mg total) by mouth daily. 02/27/16  Yes Nicholas Lose, MD   BP 115/52 mmHg  Pulse 78  Temp(Src) 98.2 F (36.8 C) (Oral)  Resp 18  SpO2 93% Physical Exam  Constitutional: She is oriented to person, place, and time. She appears well-developed and well-nourished. No distress.  HENT:  Head: Normocephalic.  Eyes: Conjunctivae are normal.  Neck: Neck supple.  Cardiovascular: Normal rate, regular rhythm and normal heart sounds.    Pulmonary/Chest: Effort normal and breath sounds normal. No respiratory distress. She has no wheezes. She has no rales.  Abdominal: Soft. Bowel sounds are normal. She exhibits no distension. There is no tenderness. There is no rebound and no guarding.  Musculoskeletal: She exhibits no edema.  Dressing to the right anterior shoulder, incision appears to be healing well with no signs of infection or dehiscence. Diffuse tenderness to palpation.  Neurological: She is alert and oriented to person, place, and time.  Skin: Skin is warm and dry.  Psychiatric: She has a normal mood and affect. Her behavior is normal.  Nursing note and vitals reviewed.   ED Course  Procedures (including critical care time) Labs Review Labs Reviewed  CBC WITH DIFFERENTIAL/PLATELET  COMPREHENSIVE METABOLIC PANEL  URINALYSIS, ROUTINE W REFLEX MICROSCOPIC (NOT AT [facility])    Imaging Review No results found. I have personally reviewed and evaluated these images and lab results  as part of my medical decision-making.   EKG Interpretation None      MDM   Final diagnoses:  Constipation  Renal failure   Pt post right shoulder replacement 3 days ago, went home 2 days ago. Here with decreased urination and constipation. Exam unremarkable. Will get post void residual. Will get labs. UA. Abd xray.   Pt's Urinalysis unremarkable. Her postvoid residual only showed 15 mL of urine. Her creatinine is elevated. Most likely dehydration. Patient is taking a large amount of opiates this time. Will hydrate, admitted for further evaluation and treatment. X-ray with no acute findings.  Filed Vitals:   05/25/16 1158 05/25/16 1739 05/25/16 2006 05/26/16 0100  BP: 118/59 102/41 109/50   Pulse:  70 68   Temp:  98.9 F (37.2 C) 98.8 F (37.1 C)   TempSrc:  Oral Oral   Resp:  17 17   Height:    5\' 2"  (1.575 m)  Weight:    112.038 kg  SpO2:  97% 100%      Jeannett Senior, PA-C 05/26/16 0615  Milton Ferguson,  MD 05/27/16 1317

## 2016-05-25 ENCOUNTER — Encounter (HOSPITAL_COMMUNITY): Payer: Self-pay | Admitting: Internal Medicine

## 2016-05-25 ENCOUNTER — Inpatient Hospital Stay (HOSPITAL_COMMUNITY): Payer: Medicare Other

## 2016-05-25 DIAGNOSIS — D638 Anemia in other chronic diseases classified elsewhere: Secondary | ICD-10-CM | POA: Diagnosis not present

## 2016-05-25 DIAGNOSIS — I959 Hypotension, unspecified: Secondary | ICD-10-CM | POA: Diagnosis not present

## 2016-05-25 DIAGNOSIS — E1122 Type 2 diabetes mellitus with diabetic chronic kidney disease: Secondary | ICD-10-CM

## 2016-05-25 DIAGNOSIS — Z885 Allergy status to narcotic agent status: Secondary | ICD-10-CM | POA: Diagnosis not present

## 2016-05-25 DIAGNOSIS — D0511 Intraductal carcinoma in situ of right breast: Secondary | ICD-10-CM | POA: Diagnosis not present

## 2016-05-25 DIAGNOSIS — H9190 Unspecified hearing loss, unspecified ear: Secondary | ICD-10-CM | POA: Diagnosis not present

## 2016-05-25 DIAGNOSIS — C50311 Malignant neoplasm of lower-inner quadrant of right female breast: Secondary | ICD-10-CM | POA: Diagnosis not present

## 2016-05-25 DIAGNOSIS — Z888 Allergy status to other drugs, medicaments and biological substances status: Secondary | ICD-10-CM | POA: Diagnosis not present

## 2016-05-25 DIAGNOSIS — R0609 Other forms of dyspnea: Secondary | ICD-10-CM | POA: Diagnosis present

## 2016-05-25 DIAGNOSIS — Z7984 Long term (current) use of oral hypoglycemic drugs: Secondary | ICD-10-CM | POA: Diagnosis not present

## 2016-05-25 DIAGNOSIS — Z79899 Other long term (current) drug therapy: Secondary | ICD-10-CM | POA: Diagnosis not present

## 2016-05-25 DIAGNOSIS — N179 Acute kidney failure, unspecified: Secondary | ICD-10-CM | POA: Diagnosis present

## 2016-05-25 DIAGNOSIS — Z6841 Body Mass Index (BMI) 40.0 and over, adult: Secondary | ICD-10-CM | POA: Diagnosis not present

## 2016-05-25 DIAGNOSIS — N189 Chronic kidney disease, unspecified: Secondary | ICD-10-CM | POA: Diagnosis not present

## 2016-05-25 DIAGNOSIS — Z886 Allergy status to analgesic agent status: Secondary | ICD-10-CM | POA: Diagnosis not present

## 2016-05-25 DIAGNOSIS — Z91010 Allergy to peanuts: Secondary | ICD-10-CM | POA: Diagnosis not present

## 2016-05-25 DIAGNOSIS — D649 Anemia, unspecified: Secondary | ICD-10-CM

## 2016-05-25 DIAGNOSIS — Z79810 Long term (current) use of selective estrogen receptor modulators (SERMs): Secondary | ICD-10-CM | POA: Diagnosis not present

## 2016-05-25 DIAGNOSIS — R0600 Dyspnea, unspecified: Secondary | ICD-10-CM | POA: Diagnosis present

## 2016-05-25 DIAGNOSIS — R39198 Other difficulties with micturition: Secondary | ICD-10-CM | POA: Diagnosis present

## 2016-05-25 DIAGNOSIS — Z96611 Presence of right artificial shoulder joint: Secondary | ICD-10-CM | POA: Diagnosis not present

## 2016-05-25 DIAGNOSIS — I10 Essential (primary) hypertension: Secondary | ICD-10-CM

## 2016-05-25 DIAGNOSIS — N182 Chronic kidney disease, stage 2 (mild): Secondary | ICD-10-CM

## 2016-05-25 LAB — COMPREHENSIVE METABOLIC PANEL
ALT: 16 U/L (ref 14–54)
AST: 35 U/L (ref 15–41)
Albumin: 2.4 g/dL — ABNORMAL LOW (ref 3.5–5.0)
Alkaline Phosphatase: 51 U/L (ref 38–126)
Anion gap: 8 (ref 5–15)
BUN: 32 mg/dL — ABNORMAL HIGH (ref 6–20)
CO2: 22 mmol/L (ref 22–32)
Calcium: 8.5 mg/dL — ABNORMAL LOW (ref 8.9–10.3)
Chloride: 100 mmol/L — ABNORMAL LOW (ref 101–111)
Creatinine, Ser: 2.47 mg/dL — ABNORMAL HIGH (ref 0.44–1.00)
GFR calc Af Amer: 20 mL/min — ABNORMAL LOW (ref >60)
GFR calc non Af Amer: 18 mL/min — ABNORMAL LOW (ref >60)
Glucose, Bld: 102 mg/dL — ABNORMAL HIGH (ref 65–99)
Potassium: 3.7 mmol/L (ref 3.5–5.1)
Sodium: 130 mmol/L — ABNORMAL LOW (ref 135–145)
Total Bilirubin: 0.8 mg/dL (ref 0.3–1.2)
Total Protein: 5.3 g/dL — ABNORMAL LOW (ref 6.5–8.1)

## 2016-05-25 LAB — RETICULOCYTES
RBC.: 2.6 MIL/uL — ABNORMAL LOW (ref 3.87–5.11)
Retic Count, Absolute: 36.4 10*3/uL (ref 19.0–186.0)
Retic Ct Pct: 1.4 % (ref 0.4–3.1)

## 2016-05-25 LAB — TYPE AND SCREEN
ABO/RH(D): B POS
Antibody Screen: NEGATIVE

## 2016-05-25 LAB — CBC
HCT: 21.5 % — ABNORMAL LOW (ref 36.0–46.0)
HCT: 24.5 % — ABNORMAL LOW (ref 36.0–46.0)
Hemoglobin: 7.4 g/dL — ABNORMAL LOW (ref 12.0–15.0)
Hemoglobin: 8.4 g/dL — ABNORMAL LOW (ref 12.0–15.0)
MCH: 28.5 pg (ref 26.0–34.0)
MCH: 28.4 pg (ref 26.0–34.0)
MCHC: 34.4 g/dL (ref 30.0–36.0)
MCHC: 34.3 g/dL (ref 30.0–36.0)
MCV: 82.7 fL (ref 78.0–100.0)
MCV: 82.8 fL (ref 78.0–100.0)
Platelets: 210 10*3/uL (ref 150–400)
Platelets: 215 10*3/uL (ref 150–400)
RBC: 2.6 MIL/uL — ABNORMAL LOW (ref 3.87–5.11)
RBC: 2.96 MIL/uL — ABNORMAL LOW (ref 3.87–5.11)
RDW: 13.5 % (ref 11.5–15.5)
RDW: 13.6 % (ref 11.5–15.5)
WBC: 9 10*3/uL (ref 4.0–10.5)
WBC: 7.8 10*3/uL (ref 4.0–10.5)

## 2016-05-25 LAB — GLUCOSE, CAPILLARY
Glucose-Capillary: 111 mg/dL — ABNORMAL HIGH (ref 65–99)
Glucose-Capillary: 102 mg/dL — ABNORMAL HIGH (ref 65–99)
Glucose-Capillary: 111 mg/dL — ABNORMAL HIGH (ref 65–99)
Glucose-Capillary: 121 mg/dL — ABNORMAL HIGH (ref 65–99)
Glucose-Capillary: 127 mg/dL — ABNORMAL HIGH (ref 65–99)

## 2016-05-25 LAB — URINALYSIS, ROUTINE W REFLEX MICROSCOPIC
Glucose, UA: NEGATIVE mg/dL
Hgb urine dipstick: NEGATIVE
Ketones, ur: 15 mg/dL — AB
Leukocytes, UA: NEGATIVE
Nitrite: NEGATIVE
Protein, ur: NEGATIVE mg/dL
Specific Gravity, Urine: 1.021 (ref 1.005–1.030)
pH: 5 (ref 5.0–8.0)

## 2016-05-25 LAB — IRON AND TIBC
Iron: 15 ug/dL — ABNORMAL LOW (ref 28–170)
Saturation Ratios: 10 % — ABNORMAL LOW (ref 10.4–31.8)
TIBC: 155 ug/dL — ABNORMAL LOW (ref 250–450)
UIBC: 140 ug/dL

## 2016-05-25 LAB — PREALBUMIN: Prealbumin: 5.1 mg/dL — ABNORMAL LOW (ref 18–38)

## 2016-05-25 LAB — FERRITIN: Ferritin: 457 ng/mL — ABNORMAL HIGH (ref 11–307)

## 2016-05-25 LAB — SODIUM, URINE, RANDOM: Sodium, Ur: <10 mmol/L

## 2016-05-25 LAB — FOLATE: Folate: 10.1 ng/mL (ref >5.9)

## 2016-05-25 LAB — TSH: TSH: 1.442 u[IU]/mL (ref 0.350–4.500)

## 2016-05-25 LAB — MAGNESIUM: Magnesium: 1.7 mg/dL (ref 1.7–2.4)

## 2016-05-25 LAB — VITAMIN B12: Vitamin B-12: 569 pg/mL (ref 180–914)

## 2016-05-25 LAB — CREATININE, URINE, RANDOM: Creatinine, Urine: 292.31 mg/dL

## 2016-05-25 LAB — PHOSPHORUS: Phosphorus: 3.2 mg/dL (ref 2.5–4.6)

## 2016-05-25 MED ORDER — ACETAMINOPHEN 650 MG RE SUPP: 650.0000 mg | Freq: Four times a day (QID) | RECTAL | Status: DC | PRN

## 2016-05-25 MED ORDER — INSULIN ASPART 100 UNIT/ML ~~LOC~~ SOLN
0.0000 [IU] | Freq: Three times a day (TID) | SUBCUTANEOUS | Status: DC
  Administered 2016-05-25: 1 [IU] via SUBCUTANEOUS

## 2016-05-25 MED ORDER — BISACODYL 10 MG RE SUPP: 10.0000 mg | Freq: Every day | RECTAL | Status: DC | PRN

## 2016-05-25 MED ORDER — SODIUM CHLORIDE 0.9 % IV SOLN
INTRAVENOUS | Status: AC
  Administered 2016-05-25: 17:00:00 via INTRAVENOUS

## 2016-05-25 MED ORDER — INSULIN ASPART 100 UNIT/ML ~~LOC~~ SOLN: 0.0000 [IU] | Freq: Every day | SUBCUTANEOUS | Status: DC

## 2016-05-25 MED ORDER — ACETAMINOPHEN 325 MG PO TABS
650.0000 mg | ORAL_TABLET | Freq: Four times a day (QID) | ORAL | Status: DC | PRN
  Administered 2016-05-25 – 2016-05-26 (×4): 650 mg via ORAL
  Filled 2016-05-25 (×4): qty 2

## 2016-05-25 MED ORDER — SODIUM CHLORIDE 0.9 % IV SOLN
Freq: Once | INTRAVENOUS | Status: AC
  Administered 2016-05-25: 06:00:00 via INTRAVENOUS

## 2016-05-25 MED ORDER — ONDANSETRON HCL 4 MG PO TABS: 4.0000 mg | ORAL_TABLET | Freq: Four times a day (QID) | ORAL | Status: DC | PRN

## 2016-05-25 MED ORDER — SENNA 8.6 MG PO TABS
1.0000 | ORAL_TABLET | Freq: Two times a day (BID) | ORAL | Status: DC
  Administered 2016-05-25 – 2016-05-27 (×5): 8.6 mg via ORAL
  Filled 2016-05-25 (×5): qty 1

## 2016-05-25 MED ORDER — TAMOXIFEN CITRATE 10 MG PO TABS
20.0000 mg | ORAL_TABLET | Freq: Every day | ORAL | Status: DC
  Administered 2016-05-25 – 2016-05-27 (×3): 20 mg via ORAL
  Filled 2016-05-25 (×3): qty 2

## 2016-05-25 MED ORDER — SODIUM CHLORIDE 0.9% FLUSH: 3.0000 mL | Freq: Two times a day (BID) | INTRAVENOUS | Status: DC

## 2016-05-25 MED ORDER — TRAMADOL HCL 50 MG PO TABS
100.0000 mg | ORAL_TABLET | Freq: Four times a day (QID) | ORAL | Status: DC | PRN
  Administered 2016-05-26 (×2): 100 mg via ORAL
  Filled 2016-05-25 (×4): qty 2

## 2016-05-25 MED ORDER — POLYETHYLENE GLYCOL 3350 17 G PO PACK
17.0000 g | PACK | Freq: Every day | ORAL | Status: DC | PRN
  Administered 2016-05-26: 17 g via ORAL
  Filled 2016-05-25: qty 1

## 2016-05-25 MED ORDER — GLIPIZIDE ER 5 MG PO TB24
5.0000 mg | ORAL_TABLET | Freq: Every day | ORAL | Status: DC
  Administered 2016-05-25 – 2016-05-27 (×3): 5 mg via ORAL
  Filled 2016-05-25 (×3): qty 1

## 2016-05-25 MED ORDER — ONDANSETRON HCL 4 MG/2ML IJ SOLN: 4.0000 mg | Freq: Four times a day (QID) | INTRAMUSCULAR | Status: DC | PRN

## 2016-05-25 MED ORDER — LEVOTHYROXINE SODIUM 100 MCG PO TABS
100.0000 ug | ORAL_TABLET | Freq: Every day | ORAL | Status: DC
  Administered 2016-05-25 – 2016-05-27 (×3): 100 ug via ORAL
  Filled 2016-05-25 (×3): qty 1

## 2016-05-25 MED ORDER — DOCUSATE SODIUM 100 MG PO CAPS
100.0000 mg | ORAL_CAPSULE | Freq: Two times a day (BID) | ORAL | Status: DC
  Administered 2016-05-25 – 2016-05-27 (×5): 100 mg via ORAL
  Filled 2016-05-25 (×5): qty 1

## 2016-05-25 MED ORDER — SODIUM CHLORIDE 0.9 % IV SOLN: INTRAVENOUS | Status: AC

## 2016-05-25 MED ORDER — AMLODIPINE BESYLATE 5 MG PO TABS
5.0000 mg | ORAL_TABLET | Freq: Every day | ORAL | Status: DC
  Administered 2016-05-25 – 2016-05-27 (×3): 5 mg via ORAL
  Filled 2016-05-25 (×3): qty 1

## 2016-05-25 MED ORDER — FERROUS SULFATE 325 (65 FE) MG PO TABS
325.0000 mg | ORAL_TABLET | Freq: Every day | ORAL | Status: DC
  Administered 2016-05-26 – 2016-05-27 (×2): 325 mg via ORAL
  Filled 2016-05-25 (×2): qty 1

## 2016-05-25 NOTE — Progress Notes (Signed)
New Admission Note:   Arrival Method: Via stretcher from ED Mental Orientation:  A & O x 4 Telemetry:  Tele #[id number] - NSR Assessment: Completed Skin:  Rt shoulder surgical incision IV: Left Hand Pain: c/o occipital headache Tubes:  None Safety Measures: Safety Fall Prevention Plan has been given, discussed and signed Admission: Completed [facility] [place] Orientation: Patient has been orientated to the room, unit and staff.  Family:  From home with 2 daughters and 2 grand-daughters  Patient has bilateral hearing aids and glasses.  Both are at home.  She did bring her straight cane with her to the hospital.  The cane is in her hospital closet.  She has a sling that she wears at all times to support her right shoulder.  Orders have been reviewed and implemented. Will continue to monitor the patient. Call light has been placed within reach and bed alarm has been activated.   Earleen Reaper RN- [place], [place] Phone number: [phone]

## 2016-05-25 NOTE — Progress Notes (Signed)
Repeat CBC noted to have improved Hgb.  Would monitor closely overnight for any acute blood loss.

## 2016-05-25 NOTE — H&P (Signed)
Karla Willis [id number] DOB: 05/23/1935 DOA: 05/24/2016     PCP: Gildardo Cranker, DO   Outpatient Specialists: [name] Patient coming from:  home Lives   With family    Chief Complaint: decreased urination  HPI: Karla Willis is a 81 y.o. female with medical history significant of right breast cancer sp lumpectomy on tamoxifen  DM 2, HTN, CKD   Presented with decreased PO intake and decreased urination of dark urine. Family states she have not had a BM since prior to the operation. Urinating only once a day. She has not drank enough water only 4 oz a day. She reports her feet and legs have been swelling this has been chronic but gotten better now the swelling have increased.  NO chest pain no shortness of breath. Yesterday family felt she was hot to touch but family unsure if she had a fever, patient denies.    Regarding pertinent Chronic problems: Recently hospitalized for Right shoulder replacement on 7/12 due to severe OA and rotator Cuff disease. Regarding Dm well controlled Last A1C 6.1, She has hx of early breast CA followed by oncology [facility] - no obtruction  IN ER: afebrile, HR 74 BP initially 90/49 WBC 11.9 Hg 9.1, NA 132, Cr up from 1.16 2 days ago to 2.98 NA 132, Alb   Bladder scan only showed 12 ml of urine  Hospitalist was called for admission for acute on chronic renal faliure  Review of Systems:    Pertinent positives include: decreased PO intake, decreased urination.  Bilateral lower extremity swelling   Constitutional:  No weight loss, night sweats, Fevers, chills, fatigue, weight loss  HEENT:  No headaches, Difficulty swallowing,Tooth/dental problems,Sore throat,  No sneezing, itching, ear ache, nasal congestion, post nasal drip,  Cardio-vascular:  No chest pain, Orthopnea, PND, anasarca, dizziness, palpitations.no GI:  No heartburn, indigestion, abdominal pain, nausea, vomiting, diarrhea, change in bowel habits, loss of appetite, melena,  blood in stool, hematemesis Resp:  no shortness of breath at rest. No dyspnea on exertion, No excess mucus, no productive cough, No non-productive cough, No coughing up of blood.No change in color of mucus.No wheezing. Skin:  no rash or lesions. No jaundice GU:  no dysuria, change in color of urine, no urgency or frequency. No straining to urinate.  No flank pain.  Musculoskeletal:  No joint pain or no joint swelling. No decreased range of motion. No back pain.  Psych:  No change in mood or affect. No depression or anxiety. No memory loss.  Neuro: no localizing neurological complaints, no tingling, no weakness, no double vision, no gait abnormality, no slurred speech, no confusion  As per HPI otherwise 10 point review of systems negative.   Past Medical History: Past Medical History  Diagnosis Date  . Allergy   . Cancer ([place])   . Diabetes mellitus without complication ([place])   . Thyroid disease   . Hypertension   . Wears glasses   . HOH (hard of hearing)   . Arthritis   . Gout   . Breast cancer ([place]) 10/24/13    right upper inner- DCIS  . Pneumonia     hx  . Hypothyroidism   . GERD (gastroesophageal reflux disease)     occ  . History of hiatal hernia    Past Surgical History  Procedure Laterality Date  . Gallbladder sugery    . Thyroid surgery  1970  . Cholecystectomy  1970  . Tubal ligation    . Finger  surgery      right hand-  . Shoulder arthroscopy  2011    left  . Colonoscopy    . Breast lumpectomy with needle localization Right 12/16/2013    Procedure: BREAST LUMPECTOMY WITH NEEDLE LOCALIZATION;  Surgeon: Edward Jolly, MD;  Location: [facility];  Service: General;  Laterality: Right;  . Breast biopsy Right     remote, benign  . Re-excision of breast lumpectomy Right 01/30/2014    Procedure: RE-EXCISION OF BREAST LUMPECTOMY;  Surgeon: Edward Jolly, MD;  Location: [facility];  Service: General;  Laterality: Right;  .  Re-excision of breast lumpectomy Right 03/06/2014    Procedure: RE-EXCISION OF BREAST LUMPECTOMY;  Surgeon: Edward Jolly, MD;  Location: [facility];  Service: General;  Laterality: Right;  . Total shoulder arthroplasty Right 05/21/2016  . Total shoulder arthroplasty Right 05/21/2016    Procedure: RIGHT TOTAL SHOULDER ARTHROPLASTY;  Surgeon: Ninetta Lights, MD;  Location: [facility];  Service: Orthopedics;  Laterality: Right;     Social History:  Ambulatory  Cane or walker if needed    reports that she has never smoked. She has never used smokeless tobacco. She reports that she does not drink alcohol or use illicit drugs.  Allergies:   Allergies  Allergen Reactions  . Actos [Pioglitazone] Swelling  . Hydrocodone Nausea Only and Other (See Comments)    "bloated"  . Codeine Rash  . Neurontin [Gabapentin] Palpitations and Other (See Comments)    dizziness  . Other Rash    Nuts--PECANS  . Peanut-Containing Drug Products Rash    Family History:    Family History  Problem Relation Age of Onset  . Seizures Sister   . Cancer Sister     breast  . Multiple sclerosis Son     Deceased   . Arthritis Father   . Diabetes Mother     Medications: Prior to Admission medications   Medication Sig Start Date End Date Taking? Authorizing Provider  amLODipine (NORVASC) 5 MG tablet take 1 tablet by mouth once daily 01/21/16  Yes Gildardo Cranker, DO  glipiZIDE (GLUCOTROL XL) 5 MG 24 hr tablet Take 1 tablet (5 mg total) by mouth daily with breakfast. 01/11/16  Yes Gildardo Cranker, DO  HYDROmorphone (DILAUDID) 2 MG tablet Take 1-2 tabs po q4-6 hours prn pain Patient taking differently: Take 2-4 mg by mouth every 4 (four) hours as needed for moderate pain.  05/21/16  Yes Aundra Dubin, PA-C  levothyroxine (SYNTHROID, LEVOTHROID) 100 MCG tablet TAKE 1 TABLET BY MOUTH ONCE DAILY BEFORE BREAKFAST 04/08/16  Yes Gildardo Cranker, DO  lisinopril-hydrochlorothiazide (PRINZIDE,ZESTORETIC) 20-12.5  MG tablet take 1 tablet by mouth once daily 01/21/16  Yes Monica Carter, DO  ondansetron (ZOFRAN) 4 MG tablet Take 1 tablet (4 mg total) by mouth every 8 (eight) hours as needed for nausea or vomiting. 05/21/16  Yes Aundra Dubin, PA-C  tamoxifen (NOLVADEX) 20 MG tablet Take 1 tablet (20 mg total) by mouth daily. 02/27/16  Yes Nicholas Lose, MD    Physical Exam: Patient Vitals for the past 24 hrs:  BP Temp Temp src Pulse Resp SpO2  05/25/16 0024 - 98.7 F (37.1 C) Oral 74 - 95 %  05/24/16 2214 - - - 78 - 93 %  05/24/16 2209 (!) 115/52 mmHg - - - - -  05/24/16 2145 (!) 92/41 mmHg - - 89 18 98 %  05/24/16 2030 (!) 90/49 mmHg 98.2 F (36.8 C) Oral  96 16 99 %    1. General:  in No Acute distress 2. Psychological: Alert and   Oriented 3. Head/ENT:     Dry Mucous Membranes                          Head Non traumatic, neck supple                        Poor Dentition 4. SKIN:   decreased Skin turgor,  Skin clean Dry and intact no rash 5. Heart: Regular rate and rhythm no  Murmur, Rub or gallop 6. Lungs:  Clear to auscultation bilaterally, no wheezes or crackles   7. Abdomen: Soft, non-tender,   Distended Obese 8. Lower extremities: no clubbing, cyanosis, Mild 1+ edema 9. Neurologically Grossly intact, moving all 4 extremities equally 10. MSK: Normal range of motion right-hand in a sling   body mass index is unknown because there is no weight on file.  Labs on Admission:   Labs on Admission: I have personally reviewed following labs and imaging studies  CBC:  Recent Labs Lab 05/22/16 0541 05/24/16 2306  WBC 8.3 11.9*  NEUTROABS  --  9.0*  HGB 8.6* 9.1*  HCT 25.3* 26.0*  MCV 84.3 83.1  PLT 161 [id number]   Basic Metabolic Panel:  Recent Labs Lab 05/22/16 0541 05/24/16 2306  NA 134* 132*  K 3.7 4.0  CL 104 99*  CO2 24 23  GLUCOSE 140* 123*  BUN 13 30*  CREATININE 1.16* 2.98*  CALCIUM 8.3* 9.1   GFR: Estimated Creatinine Clearance: 18.1 mL/min (by C-G formula based on Cr  of 2.98). Liver Function Tests:  Recent Labs Lab 05/24/16 2306  AST 32  ALT 16  ALKPHOS 52  BILITOT 0.7  PROT 5.7*  ALBUMIN 2.4*   No results for input(s): LIPASE, AMYLASE in the last 168 hours. No results for input(s): AMMONIA in the last 168 hours. Coagulation Profile: No results for input(s): INR, PROTIME in the last 168 hours. Cardiac Enzymes: No results for input(s): CKTOTAL, CKMB, CKMBINDEX, TROPONINI in the last 168 hours. BNP (last 3 results) No results for input(s): PROBNP in the last 8760 hours. HbA1C: No results for input(s): HGBA1C in the last 72 hours. CBG:  Recent Labs Lab 05/21/16 1108 05/21/16 1605 05/21/16 2127 05/22/16 0625 05/22/16 1135  GLUCAP 182* 161* 140* 151* 142*   Lipid Profile: No results for input(s): CHOL, HDL, LDLCALC, TRIG, CHOLHDL, LDLDIRECT in the last 72 hours. Thyroid Function Tests: No results for input(s): TSH, T4TOTAL, FREET4, T3FREE, THYROIDAB in the last 72 hours. Anemia Panel: No results for input(s): VITAMINB12, FOLATE, FERRITIN, TIBC, IRON, RETICCTPCT in the last 72 hours. Urine analysis:    Component Value Date/Time   COLORURINE AMBER* 05/25/2016 0002   APPEARANCEUR CLOUDY* 05/25/2016 0002   LABSPEC 1.021 05/25/2016 0002   PHURINE 5.0 05/25/2016 0002   GLUCOSEU NEGATIVE 05/25/2016 0002   HGBUR NEGATIVE 05/25/2016 0002   BILIRUBINUR SMALL* 05/25/2016 0002   KETONESUR 15* 05/25/2016 0002   PROTEINUR NEGATIVE 05/25/2016 0002   UROBILINOGEN 1.0 03/03/2011 1410   NITRITE NEGATIVE 05/25/2016 0002   LEUKOCYTESUR NEGATIVE 05/25/2016 0002   Sepsis Labs: @LABRCNTIP (procalcitonin:4,lacticidven:4) )No results found for this or any previous visit (from the past 240 hour(s)).     UA   no evidence of UTI, concentrated  Lab Results  Component Value Date   HGBA1C 6.3* 04/18/2016    Estimated Creatinine Clearance: 18.1 mL/min (  by C-G formula based on Cr of 2.98).  BNP (last 3 results) No results for input(s): PROBNP in  the last 8760 hours.   ECG REPORT Not obtained  There were no vitals filed for this visit.   Cultures: No results found for: SDES, SPECREQUEST, CULT, REPTSTATUS   Radiological Exams on Admission: Dg Abd 2 Views  05/24/2016  CLINICAL DATA:  81 year old female with constipation x4 days EXAM: ABDOMEN - 2 VIEW COMPARISON:  None. FINDINGS: Moderate stool noted in the ascending and descending colon. The transverse colon is filled with air. There is no bowel dilatation or evidence of obstruction. No free air or radiopaque calculi. There is degenerative changes of the spine and hips. No acute fracture. IMPRESSION: No bowel obstruction. Electronically Signed   By: Anner Crete M.D.   On: 05/24/2016 23:42    Chart has been reviewed    Assessment/Plan  81 y.o. female with medical history significant of right breast cancer sp lumpectomy on tamoxifen  DM 2, HTN, CKD here with acure on chronic renal failure thought to be due to dehydration  Present on Admission:   . Acute on chronic renal failure (HCC) - worsening renal function in the setting of decrease fluid intake before discontinuing this and about hydrochlorothiazide give gentle fluids obtained urine electrolytes and renal ultrasound if cracking does not improve would obtain neurology consult , discontinue Dilaudid to avoid urinary retention  . Dyspnea on exertion - persistent chronic patient also endorses lower extremity edema below about a negative adequate and sure patient does not have underlying heart failure  . Anemia - obtain anemia panel and Hemoccult stool to evaluate cause patient denies history of melena or blood in stool . Breast cancer of lower-inner quadrant of right female breast ([place]) stable we'll continue tamoxifen  . Essential hypertension, hold lisinopril  Diabetes mellitus continue glipizide and sliding scale Other plan as per orders.  DVT prophylaxis:  SCD    Code Status:   DNR/DNI  as per patient  Family is  aware   Family Communication:   Family   at  Bedside  plan of care was discussed with   Daughter Japera Wahls [phone] and Arlo Fabricius [phone]  Disposition Plan:     To home once workup is complete and patient is stable   Consults called: none    Admission status:   Inpatient    Level of care    tele      I have spent a total of 58 min on this admission   Theadora Noyes 05/25/2016, 1:32 AM    [facility]  Pager [phone]   after 2 AM please page floor coverage PA If 7AM-7PM, please contact the day team taking care of the patient  [email or web address]  Password [email or web address]

## 2016-05-25 NOTE — ED Notes (Signed)
Hospitalist MD at bedside. 

## 2016-05-25 NOTE — Progress Notes (Addendum)
Brief [facility] Progress Note.    Have reviewed Dr. Rueben Bash note.   Updated plan  Acute on chronic renal failure - Agree likely related to decreased PO intake and continued BP medication - IVF with NS at 125cc/hr, decrease to 75cc/hr overnight.  She has no signs of volume overload at this time - Strict I/O - Follow up urine urea  Anemia - Anemia panel showed low iron and sat and high ferritin, likely mixed chronic disease and iron deficiency - Repeat Hgb 7.4, will repeat this evening, if drops < 7 will transfuse patient - Type and screen - Start iron sulfate - She is on a bowel regimen - FOBT pending  HTN with hypotension - Hold HCTZ and lisinopril - BP is improving with fluids - Amlodipine tmw morning, hold for low BP  DM2 - If not eating well, consider holding oral medications overnight - SSI - Appears that she ate 100% of lunch today  DOE - Chronic issues, with renal function all TTE was ordered by admitting physician, follow up when done  Breast Ca - Continue tamoxifen  Recent shoulder surger - Attempt to reduce narcotic dose given issues above, if needed, can do low dose morphine - Tramadol ordered.   Discussed with patient and family at bedside.    Gilles Chiquito, MD  4:30 PM

## 2016-05-26 ENCOUNTER — Observation Stay (HOSPITAL_BASED_OUTPATIENT_CLINIC_OR_DEPARTMENT_OTHER): Payer: Medicare Other

## 2016-05-26 ENCOUNTER — Inpatient Hospital Stay (HOSPITAL_COMMUNITY): Payer: Medicare Other

## 2016-05-26 DIAGNOSIS — D0511 Intraductal carcinoma in situ of right breast: Secondary | ICD-10-CM | POA: Diagnosis not present

## 2016-05-26 DIAGNOSIS — Z79899 Other long term (current) drug therapy: Secondary | ICD-10-CM | POA: Diagnosis not present

## 2016-05-26 DIAGNOSIS — D508 Other iron deficiency anemias: Secondary | ICD-10-CM | POA: Diagnosis not present

## 2016-05-26 DIAGNOSIS — C50311 Malignant neoplasm of lower-inner quadrant of right female breast: Secondary | ICD-10-CM | POA: Diagnosis not present

## 2016-05-26 DIAGNOSIS — E1122 Type 2 diabetes mellitus with diabetic chronic kidney disease: Secondary | ICD-10-CM | POA: Diagnosis not present

## 2016-05-26 DIAGNOSIS — Z888 Allergy status to other drugs, medicaments and biological substances status: Secondary | ICD-10-CM | POA: Diagnosis not present

## 2016-05-26 DIAGNOSIS — N179 Acute kidney failure, unspecified: Secondary | ICD-10-CM | POA: Insufficient documentation

## 2016-05-26 DIAGNOSIS — R0609 Other forms of dyspnea: Secondary | ICD-10-CM | POA: Diagnosis not present

## 2016-05-26 DIAGNOSIS — R0600 Dyspnea, unspecified: Secondary | ICD-10-CM

## 2016-05-26 DIAGNOSIS — Z96611 Presence of right artificial shoulder joint: Secondary | ICD-10-CM | POA: Diagnosis not present

## 2016-05-26 DIAGNOSIS — D638 Anemia in other chronic diseases classified elsewhere: Secondary | ICD-10-CM | POA: Diagnosis not present

## 2016-05-26 DIAGNOSIS — N189 Chronic kidney disease, unspecified: Secondary | ICD-10-CM | POA: Diagnosis not present

## 2016-05-26 DIAGNOSIS — Z885 Allergy status to narcotic agent status: Secondary | ICD-10-CM | POA: Diagnosis not present

## 2016-05-26 DIAGNOSIS — N170 Acute kidney failure with tubular necrosis: Secondary | ICD-10-CM

## 2016-05-26 DIAGNOSIS — Z886 Allergy status to analgesic agent status: Secondary | ICD-10-CM | POA: Diagnosis not present

## 2016-05-26 DIAGNOSIS — R39198 Other difficulties with micturition: Secondary | ICD-10-CM | POA: Diagnosis not present

## 2016-05-26 DIAGNOSIS — Z91010 Allergy to peanuts: Secondary | ICD-10-CM | POA: Diagnosis not present

## 2016-05-26 DIAGNOSIS — Z7984 Long term (current) use of oral hypoglycemic drugs: Secondary | ICD-10-CM | POA: Diagnosis not present

## 2016-05-26 DIAGNOSIS — Z79810 Long term (current) use of selective estrogen receptor modulators (SERMs): Secondary | ICD-10-CM | POA: Diagnosis not present

## 2016-05-26 DIAGNOSIS — H9190 Unspecified hearing loss, unspecified ear: Secondary | ICD-10-CM | POA: Diagnosis not present

## 2016-05-26 DIAGNOSIS — R05 Cough: Secondary | ICD-10-CM | POA: Diagnosis not present

## 2016-05-26 DIAGNOSIS — R0602 Shortness of breath: Secondary | ICD-10-CM | POA: Diagnosis not present

## 2016-05-26 DIAGNOSIS — I959 Hypotension, unspecified: Secondary | ICD-10-CM | POA: Diagnosis not present

## 2016-05-26 LAB — GLUCOSE, CAPILLARY
Glucose-Capillary: 74 mg/dL (ref 65–99)
Glucose-Capillary: 101 mg/dL — ABNORMAL HIGH (ref 65–99)
Glucose-Capillary: 109 mg/dL — ABNORMAL HIGH (ref 65–99)
Glucose-Capillary: 115 mg/dL — ABNORMAL HIGH (ref 65–99)

## 2016-05-26 LAB — BASIC METABOLIC PANEL
Anion gap: 4 — ABNORMAL LOW (ref 5–15)
BUN: 29 mg/dL — ABNORMAL HIGH (ref 6–20)
CO2: 25 mmol/L (ref 22–32)
Calcium: 8.7 mg/dL — ABNORMAL LOW (ref 8.9–10.3)
Chloride: 106 mmol/L (ref 101–111)
Creatinine, Ser: 1.66 mg/dL — ABNORMAL HIGH (ref 0.44–1.00)
GFR calc Af Amer: 33 mL/min — ABNORMAL LOW (ref >60)
GFR calc non Af Amer: 28 mL/min — ABNORMAL LOW (ref >60)
Glucose, Bld: 86 mg/dL (ref 65–99)
Potassium: 4.1 mmol/L (ref 3.5–5.1)
Sodium: 135 mmol/L (ref 135–145)

## 2016-05-26 LAB — CBC
HCT: 24.2 % — ABNORMAL LOW (ref 36.0–46.0)
Hemoglobin: 8.2 g/dL — ABNORMAL LOW (ref 12.0–15.0)
MCH: 28 pg (ref 26.0–34.0)
MCHC: 33.9 g/dL (ref 30.0–36.0)
MCV: 82.6 fL (ref 78.0–100.0)
Platelets: 248 10*3/uL (ref 150–400)
RBC: 2.93 MIL/uL — ABNORMAL LOW (ref 3.87–5.11)
RDW: 13.5 % (ref 11.5–15.5)
WBC: 6 10*3/uL (ref 4.0–10.5)

## 2016-05-26 LAB — ECHOCARDIOGRAM COMPLETE
Height: 62 in
Weight: 3952 oz

## 2016-05-26 LAB — HEMOGLOBIN A1C
Hgb A1c MFr Bld: 5.7 % — ABNORMAL HIGH (ref 4.8–5.6)
Mean Plasma Glucose: 117 mg/dL

## 2016-05-26 MED ORDER — BISACODYL 10 MG RE SUPP
10.0000 mg | Freq: Two times a day (BID) | RECTAL | Status: AC
Start: 2016-05-26 — End: 2016-05-27
  Administered 2016-05-26: 10 mg via RECTAL
  Filled 2016-05-26 (×2): qty 1

## 2016-05-26 MED ORDER — SODIUM CHLORIDE 0.9 % IV SOLN
INTRAVENOUS | Status: AC
  Administered 2016-05-26: 10:00:00 via INTRAVENOUS

## 2016-05-26 MED ORDER — SORBITOL 70 % SOLN
30.0000 mL | Freq: Every day | Status: DC
  Administered 2016-05-26 – 2016-05-27 (×2): 30 mL via ORAL
  Filled 2016-05-26 (×2): qty 30

## 2016-05-26 MED ORDER — GLUCERNA SHAKE PO LIQD
237.0000 mL | Freq: Two times a day (BID) | ORAL | Status: DC
  Administered 2016-05-26 – 2016-05-27 (×2): 237 mL via ORAL

## 2016-05-26 NOTE — Care Management CC44 (Signed)
yes       Condition Code 44 Documentation Completed  Patient Details  Name: Karla Willis MRN: [id number] Date of Birth: 05/12/1935   Condition Code 44 given:   yes Patient signature on Condition Code 44 notice:   yes Documentation of 2 MD's agreement:   yes Code 44 added to claim:   yes    Adron Bene, RN 05/26/2016, 3:35 PM

## 2016-05-26 NOTE — Care Management Important Message (Signed)
Important Message  Patient Details  Name: Karla Willis MRN: [id number] Date of Birth: [birth year]   Medicare Important Message Given:  Yes    Loann Quill 05/26/2016, 3:06 PM

## 2016-05-26 NOTE — Progress Notes (Signed)
Echocardiogram 2D Echocardiogram has been performed.  Karla Willis 05/26/2016, 4:47 PM

## 2016-05-26 NOTE — Progress Notes (Signed)
Initial Nutrition Assessment  DOCUMENTATION CODES:   Morbid obesity  INTERVENTION:  Provide Glucerna Shake po BID, each supplement provides 220 kcal and 10 grams of protein  Encourage adequate PO intake.   NUTRITION DIAGNOSIS:   Increased nutrient needs related to cancer and cancer related treatments as evidenced by estimated needs.  GOAL:   Patient will meet greater than or equal to 90% of their needs  MONITOR:   PO intake, Supplement acceptance, Weight trends, Labs, I & O's  REASON FOR ASSESSMENT:   Malnutrition Screening Tool    ASSESSMENT:   81 y.o. female with medical history significant of right breast cancer sp lumpectomy on tamoxifen DM 2, HTN, CKD. Presented with decreased PO intake and decreased urination of dark urine. Family states she have not had a BM since prior to the operation. Patient admitted for acute kidney injury  Meal completion has been mostly 100%. Pt reports appetite is fine currently and PTA with no other difficulties. Pt reports usual consuming 2 meals a day. Pt does report weight loss associated with her cancer. Usual body weight reported to be ~265 lbs 6 months ago. Pt with a 6.8% weight loss in 6 months (not significant for time frame). RD to order nutritional supplementation to aid in protein needs.   Pt with no observed significant fat or muscle mass loss.   Labs an medications reviewed.   Diet Order:  Diet Carb Modified Fluid consistency:: Thin; Room service appropriate?: Yes  Skin:   (Incision on R shoulder)  Last BM:  7/10  Height:   Ht Readings from Last 1 Encounters:  05/26/16 5\' 2"  (1.575 m)    Weight:   Wt Readings from Last 1 Encounters:  05/26/16 247 lb (112.038 kg)    Ideal Body Weight:  50 kg  BMI:  Body mass index is 45.17 kg/(m^2).  Estimated Nutritional Needs:   Kcal:  1800-2100  Protein:  110-120 grams  Fluid:  1.8 - 2.1 L/day  EDUCATION NEEDS:   No education needs identified at this  time  Corrin Parker, MS, RD, LDN Pager # [phone] After hours/ weekend pager # [phone]

## 2016-05-26 NOTE — Progress Notes (Signed)
Occupational Therapy Evaluation Patient Details Name: Karla Willis MRN: [id number] DOB: 1935-10-19 Today's Date: 05/26/2016    History of Present Illness 81yo F Presented with decreased PO intake and decreased urination of dark urine. Had R total shoulder replacement last week.   Clinical Impression   Patient presents to OT with decreased ADL independence and safety due to the deficits listed below. Will benefit from skilled OT to maximize function and to facilitate a safe discharge. OT will follow.    Follow Up Recommendations  Home health OT;Supervision/Assistance - 24 hour    Equipment Recommendations  3 in 1 bedside comode    Recommendations for Other Services       Precautions / Restrictions Precautions Precautions: Fall;Shoulder Shoulder Interventions: Shoulder sling/immobilizer;Off for dressing/bathing/exercises Precaution Comments: No PROM/AROM of shoulder; No elbow, wrist, or hand ROM, no pendulums, shower in sling Required Braces or Orthoses: Sling Restrictions Weight Bearing Restrictions: Yes RUE Weight Bearing: Non weight bearing      Mobility Bed Mobility               General bed mobility comments: pt up in recliner upon arrival  Transfers Overall transfer level: Needs assistance   Transfers: Sit to/from Stand;Stand Pivot Transfers Sit to Stand: Min guard Stand pivot transfers: Min guard       General transfer comment: to/from [facility]    Balance                                            ADL Overall ADL's : Needs assistance/impaired Eating/Feeding: Set up;Sitting   Grooming: Wash/dry hands;Wash/dry face;Set up;Sitting   Upper Body Bathing: Moderate assistance;Maximal assistance;Sitting   Lower Body Bathing: Moderate assistance;Sit to/from stand   Upper Body Dressing : Moderate assistance;Maximal assistance;Sitting   Lower Body Dressing: Moderate assistance;Sit to/from stand   Toilet Transfer: Min  guard;Stand-pivot;BSC   Toileting- Water quality scientist and Hygiene: Min guard;Sitting/lateral lean       Functional mobility during ADLs: Min guard General ADL Comments: Patient assisted with toileting/grooming tasks in room via [facility]. Tolerated well. Patient reports family has been assisting with bathing, dressing, toileting at home since surgery. Sleeps in lift chair. Patient reports she feels better today and has more strength.     Vision     Perception     Praxis      Pertinent Vitals/Pain Pain Assessment: 0-10 Pain Score: 3  Pain Location: R hand, R shoulder Pain Descriptors / Indicators: Aching;Tightness;Sore Pain Intervention(s): Monitored during session;Repositioned;Limited activity within patient's tolerance     Hand Dominance Right   Extremity/Trunk Assessment Upper Extremity Assessment Upper Extremity Assessment: RUE deficits/detail RUE Deficits / Details: shoulder sx this admission, moves hand within constraints of sling--no other ROM allowed at this time per orders   Lower Extremity Assessment Lower Extremity Assessment: Defer to PT evaluation   Cervical / Trunk Assessment Cervical / Trunk Assessment: Normal   Communication Communication Communication: No difficulties   Cognition Arousal/Alertness: Awake/alert Behavior During Therapy: WFL for tasks assessed/performed Overall Cognitive Status: Within Functional Limits for tasks assessed                     General Comments       Exercises       Shoulder Instructions Shoulder Instructions Donning/doffing shirt without moving shoulder: Caregiver independent with task Method for sponge bathing under operated UE: Caregiver independent with  task Donning/doffing sling/immobilizer: Caregiver independent with task Correct positioning of sling/immobilizer: Caregiver independent with task Pendulum exercises (written home exercise program):  (NA) ROM for elbow, wrist and digits of operated UE:   (NA) Sling wearing schedule (on at all times/off for ADL's): Caregiver independent with task Proper positioning of operated UE when showering: Caregiver independent with task Dressing change:  (NA) Positioning of UE while sleeping: Caregiver independent with task    Home Living Family/patient expects to be discharged to:: Private residence Living Arrangements: Children Available Help at Discharge: Family;Available PRN/intermittently Type of Home: House Home Access: Stairs to enter [employer] of Steps: 8 Entrance Stairs-Rails: Right;Left Home Layout: Multi-level Alternate Level Stairs-Number of Steps: 6 Alternate Level Stairs-Rails: Left Bathroom Shower/Tub: Tub/shower unit;Curtain Shower/tub characteristics: Architectural technologist: Standard Bathroom Accessibility: Yes   Home Equipment: Environmental consultant - 2 wheels;Cane - single point;Tub bench          Prior Functioning/Environment Level of Independence: Needs assistance  Gait / Transfers Assistance Needed: has been sleeping in lift chair at home since surgery ADL's / Homemaking Assistance Needed: has required assistance with BADLs since shoulder surgery by family        OT Diagnosis: Generalized weakness;Acute pain   OT Problem List: Decreased strength;Decreased range of motion;Impaired balance (sitting and/or standing);Pain;Impaired UE functional use;Obesity;Decreased knowledge of use of DME or AE;Decreased knowledge of precautions   OT Treatment/Interventions: Self-care/ADL training;Patient/family education;Balance training;Therapeutic activities;DME and/or AE instruction    OT Goals(Current goals can be found in the care plan section) Acute Rehab OT Goals Patient Stated Goal: feel better OT Goal Formulation: With patient Time For Goal Achievement: 06/09/16 Potential to Achieve Goals: Good  OT Frequency: Min 2X/week   Barriers to D/C:            Co-evaluation              End of Session    Activity  Tolerance: Patient tolerated treatment well Patient left: in chair;with call bell/phone within reach   Time: 1315-1332 OT Time Calculation (min): 17 min Charges:  OT General Charges $OT Visit: 1 Procedure OT Evaluation $OT Eval Low Complexity: 1 Procedure G-Codes: OT G-codes **NOT FOR INPATIENT CLASS** Functional Assessment Tool Used: clinical judgment Functional Limitation: Self care Self Care Current Status [id number]): At least 40 percent but less than 60 percent impaired, limited or restricted Self Care Goal Status [id number]): At least 20 percent but less than 40 percent impaired, limited or restricted  Ticia Virgo A 05/26/2016, 2:17 PM

## 2016-05-26 NOTE — Evaluation (Signed)
Physical Therapy Evaluation Patient Details Name: Karla Willis MRN: [id number] DOB: 03-10-1935 Today's Date: 05/26/2016   History of Present Illness  81yo F Presented with hypotension and weakness, recent decreased PO intake and decreased urination of dark urine. Had R total shoulder replacement last week (7/12).  Has thyroid nodule, R Breast CA on tamoxifen, B shoulder surgeries (L rotator cuff repair).    Clinical Impression  Pt is notably moving fairly well but has some lateral shift with wide turns to move on IV pole and maintain min guard with PT.  Her plan is to increase her activity level with acute therapy and continue on with HHPT at discharge, especially since pt is unable to stand alone and has concerning habits with use of walking devices that indicate a higher fall risk.  Pt is also hindered by sling on RUE with her recent shoulder replacement.    Follow Up Recommendations Home health PT;Other (comment) (has SPC at home already)    Equipment Recommendations  None recommended by PT    Recommendations for Other Services Rehab consult     Precautions / Restrictions Precautions Precautions: Fall;Shoulder Type of Shoulder Precautions: shoulder sling when OOB and off for dressing bathing Shoulder Interventions: Shoulder sling/immobilizer;Off for dressing/bathing/exercises Precaution Comments: No PROM/AROM of shoulder; No elbow, wrist, or hand ROM, no pendulums, shower in sling Required Braces or Orthoses: Sling Restrictions Weight Bearing Restrictions: Yes RUE Weight Bearing: Non weight bearing      Mobility  Bed Mobility               General bed mobility comments: Pt in  chair when PT arrived  Transfers Overall transfer level: Needs assistance Equipment used: 1 person hand held assist Transfers: Sit to/from [employer] Transfers Sit to Stand: Min guard (reminded pt to use LUE to support standing) Stand pivot transfers: Min guard       General  transfer comment: used IV pole to steady briefly  Ambulation/Gait Ambulation/Gait assistance: Min guard Ambulation Distance (Feet): 50 Feet Assistive device: 1 person hand held assist (IV pole) Gait Pattern/deviations: Step-through pattern;Wide base of support (wide turns) Gait velocity: reduced Gait velocity interpretation: Below normal speed for age/gender General Gait Details: Pt is guarded with movement but no outright LOB  Stairs Stairs:  (did these previous admission)          Wheelchair Mobility    Modified Rankin (Stroke Patients Only)       Balance Overall balance assessment: Needs assistance Sitting-balance support: Feet supported Sitting balance-Leahy Scale: Good     Standing balance support: Single extremity supported Standing balance-Leahy Scale: Fair Standing balance comment: previously used SPC                             Pertinent Vitals/Pain Pain Assessment: Faces Pain Score: 3  Faces Pain Scale: Hurts a little bit Pain Location: R shoulder Pain Descriptors / Indicators: Operative site guarding;Aching Pain Intervention(s): Monitored during session;Premedicated before session;Repositioned    Home Living Family/patient expects to be discharged to:: Private residence Living Arrangements: Children Available Help at Discharge: Family;Available PRN/intermittently Type of Home: House Home Access: Stairs to enter Entrance Stairs-Rails: Psychiatric nurse of Steps: 8 Home Layout: Multi-level Home Equipment: Walker - 2 wheels;Cane - single point;Tub bench      Prior Function Level of Independence: Independent   Gait / Transfers Assistance Needed: has been sleeping in lift chair at home since surgery  ADL's / Homemaking  Assistance Needed: has required assistance with BADLs since shoulder surgery by family        Hand Dominance   Dominant Hand: Right    Extremity/Trunk Assessment   Upper Extremity Assessment: RUE  deficits/detail RUE Deficits / Details: R total shoulder replacement 7/12 RUE: Unable to fully assess due to immobilization       Lower Extremity Assessment: Overall WFL for tasks assessed      Cervical / Trunk Assessment: Normal  Communication   Communication: No difficulties  Cognition Arousal/Alertness: Awake/alert Behavior During Therapy: WFL for tasks assessed/performed Overall Cognitive Status: Within Functional Limits for tasks assessed                      General Comments General comments (skin integrity, edema, etc.): Pt is up to walk from chair with good control of gait with IV pole to push and PT supervising safety    Exercises Donning/doffing shirt without moving shoulder: Caregiver independent with task Method for sponge bathing under operated UE: Caregiver independent with task Donning/doffing sling/immobilizer: Caregiver independent with task Correct positioning of sling/immobilizer: Caregiver independent with task Pendulum exercises (written home exercise program):  (NA) ROM for elbow, wrist and digits of operated UE:  (NA) Sling wearing schedule (on at all times/off for ADL's): Caregiver independent with task Proper positioning of operated UE when showering: Caregiver independent with task Dressing change:  (NA) Positioning of UE while sleeping: Caregiver independent with task      Assessment/Plan    PT Assessment Patient needs continued PT services  PT Diagnosis Difficulty walking   PT Problem List Decreased strength;Decreased range of motion;Decreased activity tolerance;Decreased balance;Decreased mobility;Decreased coordination;Decreased knowledge of use of DME;Decreased safety awareness;Cardiopulmonary status limiting activity;Obesity;Decreased skin integrity;Pain  PT Treatment Interventions DME instruction;Gait training;Stair training;Functional mobility training;Therapeutic activities;Therapeutic exercise;Balance training;Neuromuscular  re-education;Patient/family education   PT Goals (Current goals can be found in the Care Plan section) Acute Rehab PT Goals Patient Stated Goal: to get her strength back PT Goal Formulation: With patient Time For Goal Achievement: 06/09/16 Potential to Achieve Goals: Good    Frequency Min 2X/week   Barriers to discharge Inaccessible home environment;Decreased caregiver support has stairs and low endurance    Co-evaluation               End of Session Equipment Utilized During Treatment: Gait belt Activity Tolerance: Patient tolerated treatment well Patient left: in chair;with call bell/phone within reach;with chair alarm set Nurse Communication: Mobility status    Functional Assessment Tool Used: clinical judgment Functional Limitation: Mobility: Walking and moving around Mobility: Walking and Moving Around Current Status [id number]): At least 20 percent but less than 40 percent impaired, limited or restricted Mobility: Walking and Moving Around Goal Status [phone]): At least 1 percent but less than 20 percent impaired, limited or restricted    Time: 1340-1401 PT Time Calculation (min) (ACUTE ONLY): 21 min   Charges:   PT Evaluation $PT Eval Moderate Complexity: 1 Procedure     PT G Codes:   PT G-Codes **NOT FOR INPATIENT CLASS** Functional Assessment Tool Used: clinical judgment Functional Limitation: Mobility: Walking and moving around Mobility: Walking and Moving Around Current Status [id number]): At least 20 percent but less than 40 percent impaired, limited or restricted Mobility: Walking and Moving Around Goal Status [phone]): At least 1 percent but less than 20 percent impaired, limited or restricted    Ramond Dial 05/26/2016, 4:20 PM    Mee Hives, PT MS Acute Rehab Dept. Number: [facility]  [id number] and [place]

## 2016-05-26 NOTE — Progress Notes (Signed)
[facility] PROGRESS NOTE  Karla Willis [id number] DOB: May 17, 1935 DOA: 05/24/2016   PCP: Gildardo Cranker, DO     Assessment/Plan: Active Problems:   Breast cancer of lower-inner quadrant of right female breast ([place])   Essential hypertension, benign   Diabetes mellitus, type 2 ([place])   Acute on chronic renal failure ([place])   ARF (acute renal failure) (HCC)   Dyspnea on exertion   Anemia   Acute renal failure (ARF) ([place])   81 y.o. female with medical history significant of right breast cancer sp lumpectomy on tamoxifen DM 2, HTN, CKD Presented with decreased PO intake and decreased urination of dark urine. Family states she have not had a BM since prior to the operation. Patient admitted for acute kidney injury  Assessment and plan  . Acute on chronic renal failure (HCC) - worsening renal function in the setting of decrease fluid intake , holding hydrochlorothiazide , renal failure improving, 2.47> 1.66, continue give gentle fluids  Holding Dilaudid to avoid urinary retention . Renal ultrasound without hydronephrosis  . Dyspnea on exertion -likely multifactorial secondary to anemia, deconditioning, morbid obesity, chest x-ray negative for pneumonia, 2-D echo pending, persistent chronic patient also endorses lower extremity edema below about a negative adequate and sure patient does not have underlying heart failure . Limit fluids for volume overload and hypoxemia   . anemia of chronic disease -hemoglobin was 10.8 on  6/30, anemia panel consistent with anemia of chronic disease, now hemoglobin around 8.2 , Hemoccult stool to evaluate cause patient denies history of melena or blood in stool   . Breast cancer of lower-inner quadrant of right female breast ([place]) stable we'll continue tamoxifen   HTN with hypotension - Hold HCTZ and lisinopril - BP is improving     DM2 - If not eating well, consider holding oral medications overnight - SSI - Appears that she ate  100% of lunch today  Recent shoulder surger - Attempt to reduce narcotic dose given issues above, if needed, can do low dose morphine - Tramadol ordered  DVT prophylaxsis  SCDs  Code Status:  DO NOT RESUSCITATE     Family Communication: Discussed in detail with the patient, all imaging results, lab results explained to the patient   Disposition Plan:  Anticipate discharge tomorrow , PT eval      Consultants:  None  Procedures:  None  Antibiotics: Anti-infectives    None         HPI/Subjective: Patient still constipated, denies any shortness of breath  Objective: Filed Vitals:   05/25/16 1739 05/25/16 2006 05/26/16 0100 05/26/16 0621  BP: 102/41 109/50  114/50  Pulse: 70 68  88  Temp: 98.9 F (37.2 C) 98.8 F (37.1 C)  98.3 F (36.8 C)  TempSrc: Oral Oral  Oral  Resp: 17 17  18   Height:   5\' 2"  (1.575 m)   Weight:   112.038 kg (247 lb)   SpO2: 97% 100%  99%    Intake/Output Summary (Last 24 hours) at 05/26/16 0853 Last data filed at 05/26/16 [id number]  Gross per 24 hour  Intake 1132.5 ml  Output    500 ml  Net  632.5 ml    Exam:  Examination:  General exam: Appears calm and comfortable  Respiratory system: Clear to auscultation. Respiratory effort normal. Cardiovascular system: S1 & S2 heard, RRR. No JVD, murmurs, rubs, gallops or clicks. No pedal edema. Gastrointestinal system: Abdomen is nondistended, soft and nontender. No organomegaly  or masses felt. Normal bowel sounds heard. Central nervous system: Alert and oriented. No focal neurological deficits. Extremities: Symmetric 5 x 5 power. Skin: No rashes, lesions or ulcers Psychiatry: Judgement and insight appear normal. Mood & affect appropriate.     Data Reviewed: I have personally reviewed following labs and imaging studies  Micro Results No results found for this or any previous visit (from the past 240 hour(s)).  Radiology Reports US Renal  05/25/2016  CLINICAL DATA:  Acute renal  failure. Hypertension, diabetes. History of breast cancer. EXAM: RENAL / URINARY TRACT ULTRASOUND COMPLETE COMPARISON:  None. FINDINGS: Right Kidney: Length: 10.0 cm.  Renal echogenicity is normal.  No hydronephrosis. Left Kidney: Length: 10.4 cm. Normal renal parenchymal edge echogenicity. No hydronephrosis. Midpole cyst is 3.3 x 3.6 x 3.6 cm. Lower pole is difficult to evaluate because of overlying bowel gas. Bladder: Bladder is decompressed. Additional: Study quality is degraded by patient body habitus. IMPRESSION: 1. No hydronephrosis or suspicious renal mass. 2. Left renal cyst. Electronically Signed   By: Nolon Nations M.D.   On: 05/25/2016 11:31   Dg Shoulder Right Port  05/21/2016  CLINICAL DATA:  Right shoulder arthroplasty. EXAM: PORTABLE RIGHT SHOULDER - 2+ VIEW COMPARISON:  04/10/2016 MR. FINDINGS: Right shoulder arthroplasty changes identified. No complicating features are noted. Soft tissue postoperative changes are present. IMPRESSION: Right shoulder arthroplasty changes without complicating features. Electronically Signed   By: Margarette Canada M.D.   On: 05/21/2016 14:28   Dg Abd 2 Views  05/24/2016  CLINICAL DATA:  81 year old female with constipation x4 days EXAM: ABDOMEN - 2 VIEW COMPARISON:  None. FINDINGS: Moderate stool noted in the ascending and descending colon. The transverse colon is filled with air. There is no bowel dilatation or evidence of obstruction. No free air or radiopaque calculi. There is degenerative changes of the spine and hips. No acute fracture. IMPRESSION: No bowel obstruction. Electronically Signed   By: Anner Crete M.D.   On: 05/24/2016 23:42     CBC  Recent Labs Lab 05/22/16 0541 05/24/16 2306 05/25/16 0645 05/25/16 1638 05/26/16 0357  WBC 8.3 11.9* 9.0 7.8 6.0  HGB 8.6* 9.1* 7.4* 8.4* 8.2*  HCT 25.3* 26.0* 21.5* 24.5* 24.2*  PLT 161 201 210 215 248  MCV 84.3 83.1 82.7 82.8 82.6  MCH 28.7 29.1 28.5 28.4 28.0  MCHC 34.0 35.0 34.4 34.3 33.9   RDW 13.9 14.0 13.5 13.6 13.5  LYMPHSABS  --  1.1  --   --   --   MONOABS  --  1.7*  --   --   --   EOSABS  --  0.1  --   --   --   BASOSABS  --  0.0  --   --   --     Chemistries   Recent Labs Lab 05/22/16 0541 05/24/16 2306 05/25/16 0645 05/26/16 0357  NA 134* 132* 130* 135  K 3.7 4.0 3.7 4.1  CL 104 99* 100* 106  CO2 24 23 22 25   GLUCOSE 140* 123* 102* 86  BUN 13 30* 32* 29*  CREATININE 1.16* 2.98* 2.47* 1.66*  CALCIUM 8.3* 9.1 8.5* 8.7*  MG  --   --  1.7  --   AST  --  32 35  --   ALT  --  16 16  --   ALKPHOS  --  52 51  --   BILITOT  --  0.7 0.8  --    ------------------------------------------------------------------------------------------------------------------ estimated creatinine clearance is  33 mL/min (by C-G formula based on Cr of 1.66). ------------------------------------------------------------------------------------------------------------------ No results for input(s): HGBA1C in the last 72 hours. ------------------------------------------------------------------------------------------------------------------ No results for input(s): CHOL, HDL, LDLCALC, TRIG, CHOLHDL, LDLDIRECT in the last 72 hours. ------------------------------------------------------------------------------------------------------------------  Recent Labs  05/25/16 0645  TSH 1.442   ------------------------------------------------------------------------------------------------------------------  Recent Labs  05/25/16 0645  VITAMINB12 569  FOLATE 10.1  FERRITIN 457*  TIBC 155*  IRON 15*  RETICCTPCT 1.4    Coagulation profile No results for input(s): INR, PROTIME in the last 168 hours.  No results for input(s): DDIMER in the last 72 hours.  Cardiac Enzymes No results for input(s): CKMB, TROPONINI, MYOGLOBIN in the last 168 hours.  Invalid input(s):  CK ------------------------------------------------------------------------------------------------------------------ Invalid input(s): POCBNP   CBG:  Recent Labs Lab 05/25/16 0757 05/25/16 1128 05/25/16 1639 05/25/16 2004 05/26/16 0755  GLUCAP 102* 111* 121* 127* 74       Studies: US Renal  05/25/2016  CLINICAL DATA:  Acute renal failure. Hypertension, diabetes. History of breast cancer. EXAM: RENAL / URINARY TRACT ULTRASOUND COMPLETE COMPARISON:  None. FINDINGS: Right Kidney: Length: 10.0 cm.  Renal echogenicity is normal.  No hydronephrosis. Left Kidney: Length: 10.4 cm. Normal renal parenchymal edge echogenicity. No hydronephrosis. Midpole cyst is 3.3 x 3.6 x 3.6 cm. Lower pole is difficult to evaluate because of overlying bowel gas. Bladder: Bladder is decompressed. Additional: Study quality is degraded by patient body habitus. IMPRESSION: 1. No hydronephrosis or suspicious renal mass. 2. Left renal cyst. Electronically Signed   By: Nolon Nations M.D.   On: 05/25/2016 11:31   Dg Abd 2 Views  05/24/2016  CLINICAL DATA:  81 year old female with constipation x4 days EXAM: ABDOMEN - 2 VIEW COMPARISON:  None. FINDINGS: Moderate stool noted in the ascending and descending colon. The transverse colon is filled with air. There is no bowel dilatation or evidence of obstruction. No free air or radiopaque calculi. There is degenerative changes of the spine and hips. No acute fracture. IMPRESSION: No bowel obstruction. Electronically Signed   By: Anner Crete M.D.   On: 05/24/2016 23:42      Lab Results  Component Value Date   HGBA1C 6.3* 04/18/2016   HGBA1C 5.9* 01/11/2016   HGBA1C 6.1* 09/07/2015   Lab Results  Component Value Date   LDLCALC 90 04/18/2016   CREATININE 1.66* 05/26/2016       Scheduled Meds: . amLODipine  5 mg Oral Daily  . docusate sodium  100 mg Oral BID  . ferrous sulfate  325 mg Oral Q breakfast  . glipiZIDE  5 mg Oral Q breakfast  . insulin  aspart  0-5 Units Subcutaneous QHS  . insulin aspart  0-9 Units Subcutaneous TID WC  . levothyroxine  100 mcg Oral QAC breakfast  . senna  1 tablet Oral BID  . sodium chloride flush  3 mL Intravenous Q12H  . tamoxifen  20 mg Oral Daily   Continuous Infusions:    LOS: 1 day    Time spent: >30 MINS    [facility]  [facility] Pager [phone]. If 7PM-7AM, please contact night-coverage at [email or web address], password [facility] 05/26/2016, 8:53 AM  LOS: 1 day

## 2016-05-26 NOTE — Care Management Obs Status (Signed)
[place] NOTIFICATION   Patient Details  Name: VALENCIA RAUCH MRN: [id number] Date of Birth: June 26, 1935   Medicare Observation Status Notification Given:  Yes    Chris Narasimhan, Rory Percy, RN 05/26/2016, 3:35 PM

## 2016-05-27 DIAGNOSIS — R39198 Other difficulties with micturition: Secondary | ICD-10-CM | POA: Diagnosis not present

## 2016-05-27 DIAGNOSIS — N189 Chronic kidney disease, unspecified: Secondary | ICD-10-CM | POA: Diagnosis not present

## 2016-05-27 DIAGNOSIS — Z886 Allergy status to analgesic agent status: Secondary | ICD-10-CM | POA: Diagnosis not present

## 2016-05-27 DIAGNOSIS — E1122 Type 2 diabetes mellitus with diabetic chronic kidney disease: Secondary | ICD-10-CM | POA: Diagnosis not present

## 2016-05-27 DIAGNOSIS — D0511 Intraductal carcinoma in situ of right breast: Secondary | ICD-10-CM | POA: Diagnosis not present

## 2016-05-27 DIAGNOSIS — D638 Anemia in other chronic diseases classified elsewhere: Secondary | ICD-10-CM | POA: Diagnosis not present

## 2016-05-27 DIAGNOSIS — Z888 Allergy status to other drugs, medicaments and biological substances status: Secondary | ICD-10-CM | POA: Diagnosis not present

## 2016-05-27 DIAGNOSIS — Z885 Allergy status to narcotic agent status: Secondary | ICD-10-CM | POA: Diagnosis not present

## 2016-05-27 DIAGNOSIS — I959 Hypotension, unspecified: Secondary | ICD-10-CM | POA: Diagnosis not present

## 2016-05-27 DIAGNOSIS — H9190 Unspecified hearing loss, unspecified ear: Secondary | ICD-10-CM | POA: Diagnosis not present

## 2016-05-27 DIAGNOSIS — Z79899 Other long term (current) drug therapy: Secondary | ICD-10-CM | POA: Diagnosis not present

## 2016-05-27 DIAGNOSIS — Z79810 Long term (current) use of selective estrogen receptor modulators (SERMs): Secondary | ICD-10-CM | POA: Diagnosis not present

## 2016-05-27 DIAGNOSIS — D508 Other iron deficiency anemias: Secondary | ICD-10-CM | POA: Diagnosis not present

## 2016-05-27 DIAGNOSIS — R0609 Other forms of dyspnea: Secondary | ICD-10-CM | POA: Diagnosis not present

## 2016-05-27 DIAGNOSIS — Z91010 Allergy to peanuts: Secondary | ICD-10-CM | POA: Diagnosis not present

## 2016-05-27 DIAGNOSIS — N179 Acute kidney failure, unspecified: Secondary | ICD-10-CM | POA: Diagnosis not present

## 2016-05-27 DIAGNOSIS — N170 Acute kidney failure with tubular necrosis: Secondary | ICD-10-CM | POA: Diagnosis not present

## 2016-05-27 DIAGNOSIS — Z7984 Long term (current) use of oral hypoglycemic drugs: Secondary | ICD-10-CM | POA: Diagnosis not present

## 2016-05-27 DIAGNOSIS — Z96611 Presence of right artificial shoulder joint: Secondary | ICD-10-CM | POA: Diagnosis not present

## 2016-05-27 LAB — COMPREHENSIVE METABOLIC PANEL
ALT: 23 U/L (ref 14–54)
AST: 33 U/L (ref 15–41)
Albumin: 2.3 g/dL — ABNORMAL LOW (ref 3.5–5.0)
Alkaline Phosphatase: 50 U/L (ref 38–126)
Anion gap: 6 (ref 5–15)
BUN: 16 mg/dL (ref 6–20)
CO2: 26 mmol/L (ref 22–32)
Calcium: 9 mg/dL (ref 8.9–10.3)
Chloride: 109 mmol/L (ref 101–111)
Creatinine, Ser: 1.07 mg/dL — ABNORMAL HIGH (ref 0.44–1.00)
GFR calc Af Amer: 56 mL/min — ABNORMAL LOW (ref >60)
GFR calc non Af Amer: 48 mL/min — ABNORMAL LOW (ref >60)
Glucose, Bld: 77 mg/dL (ref 65–99)
Potassium: 4.3 mmol/L (ref 3.5–5.1)
Sodium: 141 mmol/L (ref 135–145)
Total Bilirubin: 0.4 mg/dL (ref 0.3–1.2)
Total Protein: 5.2 g/dL — ABNORMAL LOW (ref 6.5–8.1)

## 2016-05-27 LAB — CBC
HCT: 24.9 % — ABNORMAL LOW (ref 36.0–46.0)
Hemoglobin: 8.4 g/dL — ABNORMAL LOW (ref 12.0–15.0)
MCH: 28.3 pg (ref 26.0–34.0)
MCHC: 33.7 g/dL (ref 30.0–36.0)
MCV: 83.8 fL (ref 78.0–100.0)
Platelets: 279 10*3/uL (ref 150–400)
RBC: 2.97 MIL/uL — ABNORMAL LOW (ref 3.87–5.11)
RDW: 13.7 % (ref 11.5–15.5)
WBC: 5.8 10*3/uL (ref 4.0–10.5)

## 2016-05-27 LAB — GLUCOSE, CAPILLARY
Glucose-Capillary: 91 mg/dL (ref 65–99)
Glucose-Capillary: 119 mg/dL — ABNORMAL HIGH (ref 65–99)

## 2016-05-27 MED ORDER — GLUCERNA SHAKE PO LIQD: 237.0000 mL | Freq: Two times a day (BID) | ORAL | Status: DC

## 2016-05-27 MED ORDER — SENNA 8.6 MG PO TABS: 1.0000 | ORAL_TABLET | Freq: Two times a day (BID) | ORAL | Status: DC

## 2016-05-27 MED ORDER — TRAMADOL HCL 50 MG PO TABS: 100.0000 mg | ORAL_TABLET | Freq: Four times a day (QID) | ORAL | Status: DC | PRN

## 2016-05-27 MED ORDER — FERROUS SULFATE 325 (65 FE) MG PO TABS: 325.0000 mg | ORAL_TABLET | Freq: Every day | ORAL | Status: DC

## 2016-05-27 MED ORDER — POLYETHYLENE GLYCOL 3350 17 G PO PACK: 17.0000 g | PACK | Freq: Every day | ORAL | Status: DC | PRN

## 2016-05-27 NOTE — Care Management Note (Signed)
Case Management Note  Patient Details  Name: Karla Willis MRN: [id number] Date of Birth: 08-Oct-1935  Subjective/Objective:           CM following for progression and d/c planning.         Action/Plan: 05/27/2016 Met with pt on 05/26/2016 re [place] and DME needs as well as Code 44 explained and given to pt. Code 56 also explained to pt daughter per pt request.  This am the pt stated that she had selected [facility] for [facility] services. [place] notified.   Expected Discharge Date:  05/25/16               Expected Discharge Plan:  [place]  In-House Referral:  NA  Discharge planning Services  CM Consult  Post Acute Care Choice:  Home Health Choice offered to:  Patient  DME Arranged:  N/A DME Agency:  NA  HH Arranged:  PT, OT HH Agency:  [facility]  Status of Service:  Completed, signed off  If discussed at [place] of Stay Meetings, dates discussed:    Additional Comments:  Adron Bene, RN 05/27/2016, 10:20 AM

## 2016-05-27 NOTE — Progress Notes (Signed)
Discharge instructions RX's and follow up appts explained provided to patient verbalized understanding. Patient left floor via wheelchair accompanied be volunteers no c.o pain or shortness of breath at discharge.  Marlies Ligman, Tivis Ringer, RN

## 2016-05-27 NOTE — Care Management CC44 (Signed)
Condition Code 44 Documentation Completed  Patient Details  Name: Karla Willis MRN: [id number] Date of Birth: [birth year]   Condition Code 44 given:   yes Patient signature on Condition Code 44 notice:   yes Documentation of 2 MD's agreement:   yes Code 44 added to claim:   yes    Adron Bene, RN 05/27/2016, 10:09 AM

## 2016-05-27 NOTE — Discharge Summary (Signed)
Physician Discharge Summary  Karla Willis MRN: [id number] DOB/AGE: 07-17-1935 81 y.o.  PCP: Gildardo Cranker, DO   Admit date: 05/24/2016 Discharge date: 05/27/2016  Discharge Diagnoses:     Active Problems:   Breast cancer of lower-inner quadrant of right female breast ([place])   Essential hypertension, benign   Diabetes mellitus, type 2 (HCC)   Acute on chronic renal failure (HCC)   ARF (acute renal failure) (HCC)   Dyspnea on exertion   Anemia   Acute renal failure (ARF) ([place])    Follow-up recommendations Follow-up with PCP in 3-5 days , including all  additional recommended appointments as below Follow-up CBC, CMP in 3-5 days Lisinopril-HCTZ stopped, request  PCP to review if this medication needs to be continued Patient was discharged with home health PT services     Current Discharge Medication List    START taking these medications   Details  feeding supplement, GLUCERNA SHAKE, (GLUCERNA SHAKE) LIQD Take 237 mLs by mouth 2 (two) times daily between meals. Qty: 30 Can, Refills: 0    ferrous sulfate 325 (65 FE) MG tablet Take 1 tablet (325 mg total) by mouth daily with breakfast. Qty: 30 tablet, Refills: 3    polyethylene glycol (MIRALAX / GLYCOLAX) packet Take 17 g by mouth daily as needed for mild constipation. Qty: 14 each, Refills: 0    senna (SENOKOT) 8.6 MG TABS tablet Take 1 tablet (8.6 mg total) by mouth 2 (two) times daily. Qty: 120 each, Refills: 0    traMADol (ULTRAM) 50 MG tablet Take 2 tablets (100 mg total) by mouth every 6 (six) hours as needed for moderate pain. Qty: 60 tablet, Refills: 0      CONTINUE these medications which have NOT CHANGED   Details  amLODipine (NORVASC) 5 MG tablet take 1 tablet by mouth once daily Qty: 30 tablet, Refills: 5    glipiZIDE (GLUCOTROL XL) 5 MG 24 hr tablet Take 1 tablet (5 mg total) by mouth daily with breakfast. Qty: 30 tablet, Refills: 6   Associated Diagnoses: Type 2 diabetes mellitus without  complication, without long-term current use of insulin (HCC)    HYDROmorphone (DILAUDID) 2 MG tablet Take 1-2 tabs po q4-6 hours prn pain Qty: 60 tablet, Refills: 0    levothyroxine (SYNTHROID, LEVOTHROID) 100 MCG tablet TAKE 1 TABLET BY MOUTH ONCE DAILY BEFORE BREAKFAST Qty: 30 tablet, Refills: 5    ondansetron (ZOFRAN) 4 MG tablet Take 1 tablet (4 mg total) by mouth every 8 (eight) hours as needed for nausea or vomiting. Qty: 40 tablet, Refills: 0    tamoxifen (NOLVADEX) 20 MG tablet Take 1 tablet (20 mg total) by mouth daily. Qty: 90 tablet, Refills: 1      STOP taking these medications     lisinopril-hydrochlorothiazide (PRINZIDE,ZESTORETIC) 20-12.5 MG tablet          Discharge Condition: Stable   Discharge Instructions Get Medicines reviewed and adjusted: Please take all your medications with you for your next visit with your Primary MD  Please request your Primary MD to go over all hospital tests and procedure/radiological results at the follow up, please ask your Primary MD to get all Hospital records sent to his/her office.  If you experience worsening of your admission symptoms, develop shortness of breath, life threatening emergency, suicidal or homicidal thoughts you must seek medical attention immediately by calling 911 or calling your MD immediately if symptoms less severe.  You must read complete instructions/literature along with all the possible adverse reactions/side effects  for all the Medicines you take and that have been prescribed to you. Take any new Medicines after you have completely understood and accpet all the possible adverse reactions/side effects.   Do not drive when taking Pain medications.   Do not take more than prescribed Pain, Sleep and Anxiety Medications  Special Instructions: If you have smoked or chewed Tobacco in the last 2 yrs please stop smoking, stop any regular Alcohol and or any Recreational drug use.  Wear Seat belts while  driving.  Please note  You were cared for by a hospitalist during your hospital stay. Once you are discharged, your primary care physician will handle any further medical issues. Please note that NO REFILLS for any discharge medications will be authorized once you are discharged, as it is imperative that you return to your primary care physician (or establish a relationship with a primary care physician if you do not have one) for your aftercare needs so that they can reassess your need for medications and monitor your lab values.  Discharge Instructions    Diet - low sodium heart healthy    Complete by:  As directed      Increase activity slowly    Complete by:  As directed             Allergies  Allergen Reactions  . Actos [Pioglitazone] Swelling  . Hydrocodone Nausea Only and Other (See Comments)    "bloated"  . Codeine Rash  . Neurontin [Gabapentin] Palpitations and Other (See Comments)    dizziness  . Other Rash    Nuts--PECANS  . Peanut-Containing Drug Products Rash      Disposition: Home with home health   Consults:  None    Significant Diagnostic Studies:  US Renal  05/25/2016  CLINICAL DATA:  Acute renal failure. Hypertension, diabetes. History of breast cancer. EXAM: RENAL / URINARY TRACT ULTRASOUND COMPLETE COMPARISON:  None. FINDINGS: Right Kidney: Length: 10.0 cm.  Renal echogenicity is normal.  No hydronephrosis. Left Kidney: Length: 10.4 cm. Normal renal parenchymal edge echogenicity. No hydronephrosis. Midpole cyst is 3.3 x 3.6 x 3.6 cm. Lower pole is difficult to evaluate because of overlying bowel gas. Bladder: Bladder is decompressed. Additional: Study quality is degraded by patient body habitus. IMPRESSION: 1. No hydronephrosis or suspicious renal mass. 2. Left renal cyst. Electronically Signed   By: Nolon Nations M.D.   On: 05/25/2016 11:31   Dg Chest Port 1 View  05/26/2016  CLINICAL DATA:  81 year old female with shortness of breath on exertion.  Dry cough. Fever with acute renal failure. EXAM: PORTABLE CHEST 1 VIEW COMPARISON:  Chest x-ray 03/03/2011. FINDINGS: Lung volumes are low. No consolidative airspace disease. No pleural effusions. No evidence of pulmonary edema. Mild cardiomegaly. Upper mediastinal contours are again remarkable for some prominence of soft tissue in the high right paratracheal station at the level of the thoracic inlet, which causes some deviation of the proximal trachea, similar to prior study from 03/03/2011. Atherosclerosis in the thoracic aorta. Bilateral shoulder arthroplasties. IMPRESSION: 1. No radiographic evidence of acute cardiopulmonary disease. 2. Mild cardiomegaly. 3. Aortic atherosclerosis. 4. Prominent soft tissue at the level of the thoracic inlet, similar to prior study, likely related to right sided thyroid enlargement. Correlation with patient's history (note is made of prior ultrasound-guided biopsy of right thyroid nodule on 04/18/2015). Electronically Signed   By: Vinnie Langton M.D.   On: 05/26/2016 09:43   Dg Shoulder Right Port  05/21/2016  CLINICAL DATA:  Right shoulder arthroplasty.  EXAM: PORTABLE RIGHT SHOULDER - 2+ VIEW COMPARISON:  04/10/2016 MR. FINDINGS: Right shoulder arthroplasty changes identified. No complicating features are noted. Soft tissue postoperative changes are present. IMPRESSION: Right shoulder arthroplasty changes without complicating features. Electronically Signed   By: Margarette Canada M.D.   On: 05/21/2016 14:28   Dg Abd 2 Views  05/24/2016  CLINICAL DATA:  81 year old female with constipation x4 days EXAM: ABDOMEN - 2 VIEW COMPARISON:  None. FINDINGS: Moderate stool noted in the ascending and descending colon. The transverse colon is filled with air. There is no bowel dilatation or evidence of obstruction. No free air or radiopaque calculi. There is degenerative changes of the spine and hips. No acute fracture. IMPRESSION: No bowel obstruction. Electronically Signed   By: Anner Crete M.D.   On: 05/24/2016 23:42      Filed Weights   05/26/16 0100 05/26/16 2148  Weight: 112.038 kg (247 lb) 112.7 kg (248 lb 7.3 oz)     Microbiology: No results found for this or any previous visit (from the past 240 hour(s)).     Blood Culture No results found for: SDES, [place], CULT, REPTSTATUS    Labs: Results for orders placed or performed during the hospital encounter of 05/24/16 (from the past 48 hour(s))  Glucose, capillary     Status: Abnormal   Collection Time: 05/25/16 11:28 AM  Result Value Ref Range   Glucose-Capillary 111 (H) 65 - 99 mg/dL  CBC     Status: Abnormal   Collection Time: 05/25/16  4:38 PM  Result Value Ref Range   WBC 7.8 4.0 - 10.5 K/uL   RBC 2.96 (L) 3.87 - 5.11 MIL/uL   Hemoglobin 8.4 (L) 12.0 - 15.0 g/dL   HCT 24.5 (L) 36.0 - 46.0 %   MCV 82.8 78.0 - 100.0 fL   MCH 28.4 26.0 - 34.0 pg   MCHC 34.3 30.0 - 36.0 g/dL   RDW 13.6 11.5 - 15.5 %   Platelets 215 150 - 400 K/uL  Glucose, capillary     Status: Abnormal   Collection Time: 05/25/16  4:39 PM  Result Value Ref Range   Glucose-Capillary 121 (H) 65 - 99 mg/dL  Type and screen [place]     Status: None   Collection Time: 05/25/16  4:45 PM  Result Value Ref Range   ABO/RH(D) B POS    Antibody Screen NEG    Sample Expiration 05/28/2016   Glucose, capillary     Status: Abnormal   Collection Time: 05/25/16  8:04 PM  Result Value Ref Range   Glucose-Capillary 127 (H) 65 - 99 mg/dL  Basic metabolic panel     Status: Abnormal   Collection Time: 05/26/16  3:57 AM  Result Value Ref Range   Sodium 135 135 - 145 mmol/L   Potassium 4.1 3.5 - 5.1 mmol/L   Chloride 106 101 - 111 mmol/L   CO2 25 22 - 32 mmol/L   Glucose, Bld 86 65 - 99 mg/dL   BUN 29 (H) 6 - 20 mg/dL   Creatinine, Ser 1.66 (H) 0.44 - 1.00 mg/dL   Calcium 8.7 (L) 8.9 - 10.3 mg/dL   GFR calc non Af Amer 28 (L) >60 mL/min   GFR calc Af Amer 33 (L) >60 mL/min    Comment: (NOTE) The eGFR has  been calculated using the CKD EPI equation. This calculation has not been validated in all clinical situations. eGFR's persistently <60 mL/min signify possible Chronic Kidney Disease.  Anion gap 4 (L) 5 - 15  CBC     Status: Abnormal   Collection Time: 05/26/16  3:57 AM  Result Value Ref Range   WBC 6.0 4.0 - 10.5 K/uL   RBC 2.93 (L) 3.87 - 5.11 MIL/uL   Hemoglobin 8.2 (L) 12.0 - 15.0 g/dL   HCT 24.2 (L) 36.0 - 46.0 %   MCV 82.6 78.0 - 100.0 fL   MCH 28.0 26.0 - 34.0 pg   MCHC 33.9 30.0 - 36.0 g/dL   RDW 13.5 11.5 - 15.5 %   Platelets 248 150 - 400 K/uL  Glucose, capillary     Status: None   Collection Time: 05/26/16  7:55 AM  Result Value Ref Range   Glucose-Capillary 74 65 - 99 mg/dL  Glucose, capillary     Status: Abnormal   Collection Time: 05/26/16 11:44 AM  Result Value Ref Range   Glucose-Capillary 101 (H) 65 - 99 mg/dL  Glucose, capillary     Status: Abnormal   Collection Time: 05/26/16  5:16 PM  Result Value Ref Range   Glucose-Capillary 109 (H) 65 - 99 mg/dL  Glucose, capillary     Status: Abnormal   Collection Time: 05/26/16  9:44 PM  Result Value Ref Range   Glucose-Capillary 115 (H) 65 - 99 mg/dL  Comprehensive metabolic panel     Status: Abnormal   Collection Time: 05/27/16  5:14 AM  Result Value Ref Range   Sodium 141 135 - 145 mmol/L   Potassium 4.3 3.5 - 5.1 mmol/L   Chloride 109 101 - 111 mmol/L   CO2 26 22 - 32 mmol/L   Glucose, Bld 77 65 - 99 mg/dL   BUN 16 6 - 20 mg/dL   Creatinine, Ser 1.07 (H) 0.44 - 1.00 mg/dL   Calcium 9.0 8.9 - 10.3 mg/dL   Total Protein 5.2 (L) 6.5 - 8.1 g/dL   Albumin 2.3 (L) 3.5 - 5.0 g/dL   AST 33 15 - 41 U/L   ALT 23 14 - 54 U/L   Alkaline Phosphatase 50 38 - 126 U/L   Total Bilirubin 0.4 0.3 - 1.2 mg/dL   GFR calc non Af Amer 48 (L) >60 mL/min   GFR calc Af Amer 56 (L) >60 mL/min    Comment: (NOTE) The eGFR has been calculated using the CKD EPI equation. This calculation has not been validated in all clinical  situations. eGFR's persistently <60 mL/min signify possible Chronic Kidney Disease.    Anion gap 6 5 - 15  CBC     Status: Abnormal   Collection Time: 05/27/16  5:14 AM  Result Value Ref Range   WBC 5.8 4.0 - 10.5 K/uL   RBC 2.97 (L) 3.87 - 5.11 MIL/uL   Hemoglobin 8.4 (L) 12.0 - 15.0 g/dL   HCT 24.9 (L) 36.0 - 46.0 %   MCV 83.8 78.0 - 100.0 fL   MCH 28.3 26.0 - 34.0 pg   MCHC 33.7 30.0 - 36.0 g/dL   RDW 13.7 11.5 - 15.5 %   Platelets 279 150 - 400 K/uL  Glucose, capillary     Status: None   Collection Time: 05/27/16  8:06 AM  Result Value Ref Range   Glucose-Capillary 91 65 - 99 mg/dL     Lipid Panel     Component Value Date/Time   CHOL 170 04/18/2016 1201   TRIG 98 04/18/2016 1201   HDL 60 04/18/2016 1201   CHOLHDL 2.8 04/18/2016 1201   LDLCALC 90  04/18/2016 1201     Lab Results  Component Value Date   HGBA1C 5.7* 05/25/2016   HGBA1C 6.3* 04/18/2016   HGBA1C 5.9* 01/11/2016        81 y.o. female with medical history significant of right breast cancer sp lumpectomy on tamoxifen DM 2, HTN, CKD Presented with decreased PO intake and decreased urination of dark urine. Family states she have not had a BM since prior to the operation. Patient admitted for acute kidney injury  Assessment and plan  . Acute on chronic renal failure (HCC) - worsening renal function in the setting of decrease fluid intake on the day of admission , holding hydrochlorothiazide/lisinopril , renal failure improving, 2.47> 1.66>1.07,  Renal ultrasound without hydronephrosis. Stable for discharge from a renal perspective  . Dyspnea on exertion -likely multifactorial secondary to anemia, deconditioning, morbid obesity, chest x-ray negative for pneumonia, 2-D echo shows EF of 65-70%, no regional wall motion abnormalities, mild pulmonary hypertension,  should benefit from home health PT  . anemia of chronic disease -hemoglobin was 10.8 on 6/30, anemia panel consistent with anemia of chronic  disease, now hemoglobin around 8.4 , Hemoccult stool to evaluate cause patient denies history of melena or blood in stool   . Breast cancer of lower-inner quadrant of right female breast ([place]) stable we'll continue tamoxifen   HTN with hypotension - Hold HCTZ and lisinopril, continue Norvasc - BP  soft   DM2 - If not eating well, consider holding oral medications overnight - SSI    Recent shoulder surger - Attempt to reduce narcotic dose given issues above, if needed,  - Tramadol as needed  DVT prophylaxsis SCDs  Code Status: DO NOT RESUSCITATE   Discharge Exam: *   Blood pressure 114/46, pulse 70, temperature 98 F (36.7 C), temperature source Oral, resp. rate 16, height '5\' 2"'  (1.575 m), weight 112.7 kg (248 lb 7.3 oz), SpO2 97 %.  General exam: Appears calm and comfortable  Respiratory system: Clear to auscultation. Respiratory effort normal. Cardiovascular system: S1 & S2 heard, RRR. No JVD, murmurs, rubs, gallops or clicks. No pedal edema. Gastrointestinal system: Abdomen is nondistended, soft and nontender. No organomegaly or masses felt. Normal bowel sounds heard. Central nervous system: Alert and oriented. No focal neurological deficits. Extremities: Symmetric 5 x 5 power. Skin: No rashes, lesions or ulcers Psychiatry: Judgement and insight appear normal. Mood & affect appropriate.     Follow-up Information    Follow up with Gildardo Cranker, DO. Schedule an appointment as soon as possible for a visit in 3 days.   Specialty:  Internal Medicine   Contact information:   [address] [place] [address] [phone]       Signed: Reyne Dumas 05/27/2016, 11:09 AM        Time spent >45 mins

## 2016-05-29 ENCOUNTER — Telehealth: Payer: Self-pay

## 2016-05-29 ENCOUNTER — Ambulatory Visit (INDEPENDENT_AMBULATORY_CARE_PROVIDER_SITE_OTHER): Payer: Medicare Other | Admitting: Internal Medicine

## 2016-05-29 ENCOUNTER — Encounter: Payer: Self-pay | Admitting: Internal Medicine

## 2016-05-29 VITALS — BP 148/70 | HR 81 | Temp 98.2°F | Wt 246.0 lb

## 2016-05-29 DIAGNOSIS — Z1211 Encounter for screening for malignant neoplasm of colon: Secondary | ICD-10-CM

## 2016-05-29 DIAGNOSIS — N179 Acute kidney failure, unspecified: Secondary | ICD-10-CM | POA: Diagnosis not present

## 2016-05-29 DIAGNOSIS — E1122 Type 2 diabetes mellitus with diabetic chronic kidney disease: Secondary | ICD-10-CM | POA: Diagnosis not present

## 2016-05-29 DIAGNOSIS — D649 Anemia, unspecified: Secondary | ICD-10-CM | POA: Diagnosis not present

## 2016-05-29 DIAGNOSIS — I10 Essential (primary) hypertension: Secondary | ICD-10-CM | POA: Diagnosis not present

## 2016-05-29 DIAGNOSIS — N182 Chronic kidney disease, stage 2 (mild): Secondary | ICD-10-CM

## 2016-05-29 DIAGNOSIS — I110 Hypertensive heart disease with heart failure: Secondary | ICD-10-CM | POA: Diagnosis not present

## 2016-05-29 DIAGNOSIS — C50311 Malignant neoplasm of lower-inner quadrant of right female breast: Secondary | ICD-10-CM | POA: Diagnosis not present

## 2016-05-29 DIAGNOSIS — N189 Chronic kidney disease, unspecified: Secondary | ICD-10-CM

## 2016-05-29 DIAGNOSIS — E119 Type 2 diabetes mellitus without complications: Secondary | ICD-10-CM | POA: Diagnosis not present

## 2016-05-29 DIAGNOSIS — N170 Acute kidney failure with tubular necrosis: Secondary | ICD-10-CM

## 2016-05-29 DIAGNOSIS — K219 Gastro-esophageal reflux disease without esophagitis: Secondary | ICD-10-CM | POA: Diagnosis not present

## 2016-05-29 DIAGNOSIS — E039 Hypothyroidism, unspecified: Secondary | ICD-10-CM | POA: Diagnosis not present

## 2016-05-29 NOTE — Telephone Encounter (Signed)
Message left on triage voicemail: Referral order was placed for home health PT/OT. Patient seen Orthopedic doctor today and he advised for patient to have out patient PT/OT vs at home. This is a FYI message from [place]

## 2016-05-29 NOTE — Progress Notes (Signed)
Location:  [facility] clinic Provider: Dixon Luczak L. Mariea Clonts, D.O., C.M.D.  Code Status: DNR Goals of Care:  Advanced Directives 05/29/2016  Does patient have an advance directive? Yes  Type of Advance Directive Healthcare Power of Attorney  Copy of advanced directive(s) in chart? Yes     Chief Complaint  Patient presents with  . Hospitalization Follow-up    renal failure    HPI: Patient is a 81 y.o. female pt of Dr. Vale [place] with h/o right breast ca s/p lumpectomy on tamoxifen, DMII, HTN, CKD seen today for hospital follow-up s/p admission from 7/15-18 for decreased po intake and decreased output of dark urine, as well as constipation.  Her feet were very swollen.  She was admitted with acute on chronic renal failure.  Cr 2.47 improved to 1.07 with hydration, holding lisinopril/hctz, and checking renal US which was negative for obstruction.  Her dyspnea on exertion was felt to be due to anemia and deconditioning.  Echo was unremarkable.  Home health therapy was recommended.  hbg was down to 8.4 from 10.8 6/30.  BP remained low at d/c so ace/hctz still held and left up to primary care about resuming.  Of note, she also had recent shoulder surgery (right shoulder replacement) and is wearing a sling.  Tramadol only for pain as needed was ordered due to her above problems.  Pain not too bad in her shoulder.  Right hand is swelling real bad.  Feet are also swelling very badly.  Did start drinking more water yesterday.  They don't use much salt.  They do have bacon in the morning.  Feet had gone down to normal after surgery.  When left hospital the last time, they were swollen in comparison, but are worse now.  Sees her endocrinologist next week.    Past Medical History  Diagnosis Date  . Allergy   . Cancer ([place])   . Diabetes mellitus without complication ([place])   . Thyroid disease   . Hypertension   . Wears glasses   . HOH (hard of hearing)   . Arthritis   . Gout   . 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 Pneumonia     hx  . Hypothyroidism   . GERD (gastroesophageal reflux disease)     occ  . History of hiatal hernia     Past Surgical History  Procedure Laterality Date  . Gallbladder sugery    . Thyroid surgery  1970  . Cholecystectomy  1970  . Tubal ligation    . Finger surgery      right hand-  . Shoulder arthroscopy  2011    left  . Colonoscopy    . Breast lumpectomy with needle localization Right 12/16/2013    Procedure: BREAST LUMPECTOMY WITH NEEDLE LOCALIZATION;  Surgeon: Edward Jolly, MD;  Location: [place];  Service: General;  Laterality: Right;  . Breast biopsy Right     remote, benign  . Re-excision of breast lumpectomy Right 01/30/2014    Procedure: RE-EXCISION OF BREAST LUMPECTOMY;  Surgeon: Edward Jolly, MD;  Location: [place];  Service: General;  Laterality: Right;  . Re-excision of breast lumpectomy Right 03/06/2014    Procedure: RE-EXCISION OF BREAST LUMPECTOMY;  Surgeon: Edward Jolly, MD;  Location: [place];  Service: General;  Laterality: Right;  . Total shoulder arthroplasty Right 05/21/2016  . Total shoulder arthroplasty Right 05/21/2016    Procedure: RIGHT TOTAL SHOULDER ARTHROPLASTY;  Surgeon: Ninetta Lights, MD;  Location: [place];  Service: Orthopedics;  Laterality: Right;    Allergies  Allergen Reactions  . Actos [Pioglitazone] Swelling  . Hydrocodone Nausea Only and Other (See Comments)    "bloated"  . Codeine Rash  . Neurontin [Gabapentin] Palpitations and Other (See Comments)    dizziness  . Other Rash    Nuts--PECANS  . Peanut-Containing Drug Products Rash      Medication List       This list is accurate as of: 05/29/16  2:11 PM.  Always use your most recent med list.               allopurinol 100 MG tablet  Commonly known as:  ZYLOPRIM  Take 100 mg by mouth daily.     amLODipine 5 MG tablet  Commonly known as:  NORVASC  take 1 tablet  by mouth once daily     feeding supplement (GLUCERNA SHAKE) Liqd  Take 237 mLs by mouth 2 (two) times daily between meals.     ferrous sulfate 325 (65 FE) MG tablet  Take 1 tablet (325 mg total) by mouth daily with breakfast.     glipiZIDE 5 MG 24 hr tablet  Commonly known as:  GLUCOTROL XL  Take 1 tablet (5 mg total) by mouth daily with breakfast.     HYDROmorphone 2 MG tablet  Commonly known as:  DILAUDID  Take 2-4 mg by mouth every 4 (four) hours as needed for moderate pain or severe pain.     levothyroxine 100 MCG tablet  Commonly known as:  SYNTHROID, LEVOTHROID  TAKE 1 TABLET BY MOUTH ONCE DAILY BEFORE BREAKFAST     lisinopril-hydrochlorothiazide 20-12.5 MG tablet  Commonly known as:  PRINZIDE,ZESTORETIC  Take 1 tablet by mouth daily.     ondansetron 4 MG tablet  Commonly known as:  ZOFRAN  Take 1 tablet (4 mg total) by mouth every 8 (eight) hours as needed for nausea or vomiting.     polyethylene glycol packet  Commonly known as:  MIRALAX / GLYCOLAX  Take 17 g by mouth daily as needed for mild constipation.     senna 8.6 MG Tabs tablet  Commonly known as:  SENOKOT  Take 1 tablet (8.6 mg total) by mouth 2 (two) times daily.     tamoxifen 20 MG tablet  Commonly known as:  NOLVADEX  Take 1 tablet (20 mg total) by mouth daily.     traMADol 50 MG tablet  Commonly known as:  ULTRAM  Take 2 tablets (100 mg total) by mouth every 6 (six) hours as needed for moderate pain.        Review of Systems:  Review of Systems  Constitutional: Negative for fever, chills and malaise/fatigue.  HENT: Negative for congestion.   Respiratory: Negative for cough and shortness of breath.   Cardiovascular: Negative for chest pain and leg swelling.  Gastrointestinal: Negative for abdominal pain and constipation.  Genitourinary: Negative for dysuria, urgency and frequency.  Musculoskeletal: Positive for joint pain. Negative for falls.  Skin: Negative for rash.  Neurological: Negative  for dizziness and weakness.  Endo/Heme/Allergies: Does not bruise/bleed easily.  Psychiatric/Behavioral: Negative for depression and memory loss.    Health Maintenance  Topic Date Due  . OPHTHALMOLOGY EXAM  11/06/1945  . TETANUS/TDAP  11/05/1956  . DEXA SCAN  11/05/2002  . INFLUENZA VACCINE  06/10/2016  . URINE MICROALBUMIN  09/06/2016  . HEMOGLOBIN A1C  11/25/2016  . FOOT EXAM  04/18/2017  .  PNA vac Low Risk Adult (2 of 2 - PCV13) 04/18/2017  . ZOSTAVAX  Completed    Physical Exam: Filed Vitals:   05/29/16 1353  BP: 148/70  Pulse: 81  Temp: 98.2 F (36.8 C)  TempSrc: Oral  Weight: 246 lb (111.585 kg)  SpO2: 98%   Body mass index is 44.98 kg/(m^2). Physical Exam  Constitutional: She is oriented to person, place, and time. She appears well-developed and well-nourished.  Obese female  Cardiovascular: Normal rate, regular rhythm, normal heart sounds and intact distal pulses.   Pulmonary/Chest: Effort normal and breath sounds normal. She has no rales.  Musculoskeletal: She exhibits tenderness.  Right shoulder in sling, dressings in place  Neurological: She is alert and oriented to person, place, and time.  Skin: Skin is warm and dry.  Psychiatric: She has a normal mood and affect.    Labs reviewed: Basic Metabolic Panel:  Recent Labs  01/11/16 1108 04/18/16 1201  05/25/16 0645 05/26/16 0357 05/27/16 0514  NA 146* 142  < > 130* 135 141  K 5.0 3.9  < > 3.7 4.1 4.3  CL 108* 103  < > 100* 106 109  CO2 17* 23  < > 22 25 26   GLUCOSE 98 92  < > 102* 86 77  BUN 19 28*  < > 32* 29* 16  CREATININE 1.16* 1.30*  < > 2.47* 1.66* 1.07*  CALCIUM 9.5 9.5  < > 8.5* 8.7* 9.0  MG  --   --   --  1.7  --   --   PHOS  --   --   --  3.2  --   --   TSH 2.470 0.574  --  1.442  --   --   < > = values in this interval not displayed. Liver Function Tests:  Recent Labs  05/24/16 2306 05/25/16 0645 05/27/16 0514  AST 32 35 33  ALT 16 16 23   ALKPHOS 52 51 50  BILITOT 0.7 0.8  0.4  PROT 5.7* 5.3* 5.2*  ALBUMIN 2.4* 2.4* 2.3*   No results for input(s): LIPASE, AMYLASE in the last 8760 hours. No results for input(s): AMMONIA in the last 8760 hours. CBC:  Recent Labs  04/18/16 1201  05/24/16 2306  05/25/16 1638 05/26/16 0357 05/27/16 0514  WBC 5.7  < > 11.9*  < > 7.8 6.0 5.8  NEUTROABS 4.1  --  9.0*  --   --   --   --   HGB  --   < > 9.1*  < > 8.4* 8.2* 8.4*  HCT 36.0  < > 26.0*  < > 24.5* 24.2* 24.9*  MCV 89  < > 83.1  < > 82.8 82.6 83.8  PLT 185  < > 201  < > 215 248 279  < > = values in this interval not displayed. Lipid Panel:  Recent Labs  09/07/15 1519 01/11/16 1108 04/18/16 1201  CHOL 171 180 170  HDL 57 60 60  LDLCALC 88 103* 90  TRIG 129 86 98  CHOLHDL 3.0 3.0 2.8   Lab Results  Component Value Date   HGBA1C 5.7* 05/25/2016    Procedures since last visit: US Renal  05/25/2016  CLINICAL DATA:  Acute renal failure. Hypertension, diabetes. History of breast cancer. EXAM: RENAL / URINARY TRACT ULTRASOUND COMPLETE COMPARISON:  None. FINDINGS: Right Kidney: Length: 10.0 cm.  Renal echogenicity is normal.  No hydronephrosis. Left Kidney: Length: 10.4 cm. Normal renal parenchymal edge echogenicity. No hydronephrosis. Midpole  cyst is 3.3 x 3.6 x 3.6 cm. Lower pole is difficult to evaluate because of overlying bowel gas. Bladder: Bladder is decompressed. Additional: Study quality is degraded by patient body habitus. IMPRESSION: 1. No hydronephrosis or suspicious renal mass. 2. Left renal cyst. Electronically Signed   By: Nolon Nations M.D.   On: 05/25/2016 11:31   Dg Abd 2 Views  05/24/2016  CLINICAL DATA:  81 year old female with constipation x4 days EXAM: ABDOMEN - 2 VIEW COMPARISON:  None. FINDINGS: Moderate stool noted in the ascending and descending colon. The transverse colon is filled with air. There is no bowel dilatation or evidence of obstruction. No free air or radiopaque calculi. There is degenerative changes of the spine and hips.  No acute fracture. IMPRESSION: No bowel obstruction. Electronically Signed   By: Anner Crete M.D.   On: 05/24/2016 23:42    Assessment/Plan 1. Acute on chronic renal failure (HCC) - had improved at discharge -now pt with more edema but has been off ace/hctz and she may need a diuretic of some type to help the swelling - recheck bmp to make sure renal function has not again worsened - Basic metabolic panel - if renal function remains improved, will restart ace/hctz  2. Anemia, unspecified anemia type - was acutely worse on top of her baseline chronic renal insufficiency - Fecal occult blood, imunochemical; Future sent with patient to complete to r/o any overlap with GI source  3. Type 2 diabetes mellitus without complication, without long-term current use of insulin ([place]) -cont current therapy and f/u with endocrine next week as scheduled  4. Essential hypertension, benign -bp also slighly elevated today off medication so hopefully renal function remains improved so ace/hctz can be resumed  Labs/tests ordered:   Orders Placed This Encounter  Procedures  . Fecal occult blood, imunochemical    Standing Status: Future     Number of Occurrences:      Standing Expiration Date: 09/14/2016  . Basic metabolic panel    Order Specific Question:  Has the patient fasted?    Answer:  No  . POC Hemoccult Bld/Stl (3-Cd Home Screen)    Standing Status: Future     Number of Occurrences:      Standing Expiration Date: 05/29/2017    Next appt:  07/25/2016  Keep as scheduled  Conrado Nance L. Layza Summa, D.O. [facility] [address], [place] [address] Cell Phone (Mon-Fri 8am-5pm):  [phone] On Call:  [phone] & follow prompts after 5pm & weekends Office Phone:  [phone] Office Fax:  [phone]

## 2016-05-30 LAB — BASIC METABOLIC PANEL
BUN: 14 mg/dL (ref 7–25)
CO2: 25 mmol/L (ref 20–31)
Calcium: 9.1 mg/dL (ref 8.6–10.4)
Chloride: 106 mmol/L (ref 98–110)
Creat: 1.08 mg/dL — ABNORMAL HIGH (ref 0.60–0.93)
Glucose, Bld: 117 mg/dL — ABNORMAL HIGH (ref 65–99)
Potassium: 4.4 mmol/L (ref 3.5–5.3)
Sodium: 141 mmol/L (ref 135–146)

## 2016-06-03 ENCOUNTER — Other Ambulatory Visit: Payer: Self-pay

## 2016-06-03 ENCOUNTER — Other Ambulatory Visit: Payer: Medicare Other

## 2016-06-03 DIAGNOSIS — S46111D Strain of muscle, fascia and tendon of long head of biceps, right arm, subsequent encounter: Secondary | ICD-10-CM | POA: Diagnosis not present

## 2016-06-03 DIAGNOSIS — Z1211 Encounter for screening for malignant neoplasm of colon: Secondary | ICD-10-CM | POA: Diagnosis not present

## 2016-06-04 ENCOUNTER — Encounter: Payer: Self-pay | Admitting: *Deleted

## 2016-06-04 LAB — FECAL OCCULT BLOOD, IMMUNOCHEMICAL: Fecal Occult Blood: NEGATIVE

## 2016-06-06 ENCOUNTER — Other Ambulatory Visit: Payer: Medicare Other

## 2016-06-06 DIAGNOSIS — N182 Chronic kidney disease, stage 2 (mild): Secondary | ICD-10-CM | POA: Diagnosis not present

## 2016-06-06 DIAGNOSIS — N170 Acute kidney failure with tubular necrosis: Secondary | ICD-10-CM | POA: Diagnosis not present

## 2016-06-06 DIAGNOSIS — E1122 Type 2 diabetes mellitus with diabetic chronic kidney disease: Secondary | ICD-10-CM | POA: Diagnosis not present

## 2016-06-06 LAB — BASIC METABOLIC PANEL WITH GFR
BUN: 19 mg/dL (ref 7–25)
CO2: 24 mmol/L (ref 20–31)
Calcium: 9.4 mg/dL (ref 8.6–10.4)
Chloride: 106 mmol/L (ref 98–110)
Creat: 1.08 mg/dL — ABNORMAL HIGH (ref 0.60–0.93)
GFR, Est African American: 57 mL/min — ABNORMAL LOW (ref >=60)
GFR, Est Non African American: 49 mL/min — ABNORMAL LOW (ref >=60)
Glucose, Bld: 101 mg/dL — ABNORMAL HIGH (ref 65–99)
Potassium: 4.2 mmol/L (ref 3.5–5.3)
Sodium: 140 mmol/L (ref 135–146)

## 2016-06-09 DIAGNOSIS — M19011 Primary osteoarthritis, right shoulder: Secondary | ICD-10-CM | POA: Diagnosis not present

## 2016-06-09 DIAGNOSIS — M25511 Pain in right shoulder: Secondary | ICD-10-CM | POA: Diagnosis not present

## 2016-06-09 DIAGNOSIS — M25611 Stiffness of right shoulder, not elsewhere classified: Secondary | ICD-10-CM | POA: Diagnosis not present

## 2016-06-09 DIAGNOSIS — Z96611 Presence of right artificial shoulder joint: Secondary | ICD-10-CM | POA: Diagnosis not present

## 2016-06-13 DIAGNOSIS — M19011 Primary osteoarthritis, right shoulder: Secondary | ICD-10-CM | POA: Diagnosis not present

## 2016-06-13 DIAGNOSIS — M25511 Pain in right shoulder: Secondary | ICD-10-CM | POA: Diagnosis not present

## 2016-06-13 DIAGNOSIS — M25611 Stiffness of right shoulder, not elsewhere classified: Secondary | ICD-10-CM | POA: Diagnosis not present

## 2016-06-13 DIAGNOSIS — Z96611 Presence of right artificial shoulder joint: Secondary | ICD-10-CM | POA: Diagnosis not present

## 2016-06-16 DIAGNOSIS — M25611 Stiffness of right shoulder, not elsewhere classified: Secondary | ICD-10-CM | POA: Diagnosis not present

## 2016-06-16 DIAGNOSIS — Z96611 Presence of right artificial shoulder joint: Secondary | ICD-10-CM | POA: Diagnosis not present

## 2016-06-16 DIAGNOSIS — M19011 Primary osteoarthritis, right shoulder: Secondary | ICD-10-CM | POA: Diagnosis not present

## 2016-06-16 DIAGNOSIS — M25511 Pain in right shoulder: Secondary | ICD-10-CM | POA: Diagnosis not present

## 2016-06-18 DIAGNOSIS — Z96611 Presence of right artificial shoulder joint: Secondary | ICD-10-CM | POA: Diagnosis not present

## 2016-06-18 DIAGNOSIS — M25511 Pain in right shoulder: Secondary | ICD-10-CM | POA: Diagnosis not present

## 2016-06-18 DIAGNOSIS — M25611 Stiffness of right shoulder, not elsewhere classified: Secondary | ICD-10-CM | POA: Diagnosis not present

## 2016-06-18 DIAGNOSIS — M19011 Primary osteoarthritis, right shoulder: Secondary | ICD-10-CM | POA: Diagnosis not present

## 2016-06-23 DIAGNOSIS — Z96611 Presence of right artificial shoulder joint: Secondary | ICD-10-CM | POA: Diagnosis not present

## 2016-06-23 DIAGNOSIS — M25611 Stiffness of right shoulder, not elsewhere classified: Secondary | ICD-10-CM | POA: Diagnosis not present

## 2016-06-23 DIAGNOSIS — M25511 Pain in right shoulder: Secondary | ICD-10-CM | POA: Diagnosis not present

## 2016-06-23 DIAGNOSIS — M19011 Primary osteoarthritis, right shoulder: Secondary | ICD-10-CM | POA: Diagnosis not present

## 2016-06-25 DIAGNOSIS — M25511 Pain in right shoulder: Secondary | ICD-10-CM | POA: Diagnosis not present

## 2016-06-25 DIAGNOSIS — Z96611 Presence of right artificial shoulder joint: Secondary | ICD-10-CM | POA: Diagnosis not present

## 2016-06-25 DIAGNOSIS — M25611 Stiffness of right shoulder, not elsewhere classified: Secondary | ICD-10-CM | POA: Diagnosis not present

## 2016-06-25 DIAGNOSIS — M19011 Primary osteoarthritis, right shoulder: Secondary | ICD-10-CM | POA: Diagnosis not present

## 2016-06-30 DIAGNOSIS — M25611 Stiffness of right shoulder, not elsewhere classified: Secondary | ICD-10-CM | POA: Diagnosis not present

## 2016-06-30 DIAGNOSIS — M25511 Pain in right shoulder: Secondary | ICD-10-CM | POA: Diagnosis not present

## 2016-06-30 DIAGNOSIS — M19011 Primary osteoarthritis, right shoulder: Secondary | ICD-10-CM | POA: Diagnosis not present

## 2016-06-30 DIAGNOSIS — Z96611 Presence of right artificial shoulder joint: Secondary | ICD-10-CM | POA: Diagnosis not present

## 2016-07-01 DIAGNOSIS — M19011 Primary osteoarthritis, right shoulder: Secondary | ICD-10-CM | POA: Diagnosis not present

## 2016-07-02 DIAGNOSIS — Z96611 Presence of right artificial shoulder joint: Secondary | ICD-10-CM | POA: Diagnosis not present

## 2016-07-02 DIAGNOSIS — M25611 Stiffness of right shoulder, not elsewhere classified: Secondary | ICD-10-CM | POA: Diagnosis not present

## 2016-07-02 DIAGNOSIS — M19011 Primary osteoarthritis, right shoulder: Secondary | ICD-10-CM | POA: Diagnosis not present

## 2016-07-02 DIAGNOSIS — M25511 Pain in right shoulder: Secondary | ICD-10-CM | POA: Diagnosis not present

## 2016-07-02 NOTE — Progress Notes (Signed)
OT Eval - Addendum - late entry 11/26/16    05/26/16 1400  OT Assessment  OT Therapy Diagnosis  Generalized weakness;Acute pain (pain in R shoulder)  OT Recommendation/Assessment Patient needs continued OT Services  OT Problem List Decreased strength;Decreased range of motion;Impaired balance (sitting and/or standing);Pain;Impaired UE functional use;Obesity;Decreased knowledge of use of DME or AE;Decreased knowledge of precautions  [facility], OTR/L  [phone] 07/02/2016

## 2016-07-03 ENCOUNTER — Encounter: Payer: Self-pay | Admitting: Nurse Practitioner

## 2016-07-03 ENCOUNTER — Ambulatory Visit (INDEPENDENT_AMBULATORY_CARE_PROVIDER_SITE_OTHER): Payer: Medicare Other | Admitting: Nurse Practitioner

## 2016-07-03 VITALS — BP 118/62 | HR 78 | Temp 98.4°F | Ht 62.0 in | Wt 247.4 lb

## 2016-07-03 DIAGNOSIS — R600 Localized edema: Secondary | ICD-10-CM | POA: Diagnosis not present

## 2016-07-03 DIAGNOSIS — E46 Unspecified protein-calorie malnutrition: Secondary | ICD-10-CM

## 2016-07-03 DIAGNOSIS — I10 Essential (primary) hypertension: Secondary | ICD-10-CM | POA: Diagnosis not present

## 2016-07-03 NOTE — Progress Notes (Signed)
Careteam: Patient Care Team: Gildardo Cranker, DO as PCP - General (Internal Medicine) Nicholas Lose, MD as Consulting Physician (Hematology and Oncology)  Advanced Directive information    Allergies  Allergen Reactions  . Actos [Pioglitazone] Swelling  . Hydrocodone Nausea Only and Other (See Comments)    "bloated"  . Codeine Rash  . Neurontin [Gabapentin] Palpitations and Other (See Comments)    dizziness  . Other Rash    Nuts--PECANS  . Peanut-Containing Drug Products Rash    Chief Complaint  Patient presents with  . Follow-up    C/o Bilateral foot and leg swelling x 1 month     HPI: Patient is a 81 y.o. female seen in the office today due to leg and foot swelling for 1 month. Pt with hx of swelling in lower extremities, HTN, DM, renal insuff, anemia and more. Pt was hospitalized in July for shoulder surgery, had right total shoulder arthroplasty and then was readmitted for acute renal failure. Ace/hctz was placed on hold until she was reevaluated in office and was restarted. Swelling has not improved since medication was restarted.  Has had swelling on and off for a long time but it generally will go down but this is the worst that it has been. Generally sitting at home in a recliner with legs down.  Does not sleep in the bed, sleeps in recliner because it is hard for her to get out of bed.  Eating 2 pieces of bacon a day otherwise does not eat a lot of sodium. No frozen dinners or can foods.   Review of Systems:  Review of Systems  Constitutional: Negative for activity change, appetite change, chills, diaphoresis, fatigue and fever.  Respiratory: Negative for cough, chest tightness and shortness of breath.   Cardiovascular: Positive for leg swelling. Negative for chest pain and palpitations.  Gastrointestinal: Negative for abdominal pain, blood in stool, constipation, diarrhea, nausea and vomiting.  Genitourinary: Negative for difficulty urinating and dysuria.    Musculoskeletal: Positive for arthralgias and gait problem.  Psychiatric/Behavioral: Negative for sleep disturbance. The patient is not nervous/anxious.     Past Medical History:  Diagnosis Date  . Allergy   . Arthritis   . 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 Cancer ([place])   . Diabetes mellitus without complication ([place])   . GERD (gastroesophageal reflux disease)    occ  . Gout   . History of hiatal hernia   . HOH (hard of hearing)   . Hypertension   . Hypothyroidism   . Pneumonia    hx  . Thyroid disease   . Wears glasses    Past Surgical History:  Procedure Laterality Date  . BREAST BIOPSY Right    remote, benign  . BREAST LUMPECTOMY WITH NEEDLE LOCALIZATION Right 12/16/2013   Procedure: BREAST LUMPECTOMY WITH NEEDLE LOCALIZATION;  Surgeon: Edward Jolly, MD;  Location: [place];  Service: General;  Laterality: Right;  . CHOLECYSTECTOMY  1970  . COLONOSCOPY    . FINGER SURGERY     right hand-  . gallbladder sugery    . RE-EXCISION OF BREAST LUMPECTOMY Right 01/30/2014   Procedure: RE-EXCISION OF BREAST LUMPECTOMY;  Surgeon: Edward Jolly, MD;  Location: [place];  Service: General;  Laterality: Right;  . RE-EXCISION OF BREAST LUMPECTOMY Right 03/06/2014   Procedure: RE-EXCISION OF BREAST LUMPECTOMY;  Surgeon: Edward Jolly, MD;  Location: [place];  Service: General;  Laterality:  Right;  [facility] SHOULDER ARTHROSCOPY  2011   left  . THYROID SURGERY  1970  . TOTAL SHOULDER ARTHROPLASTY Right 05/21/2016  . TOTAL SHOULDER ARTHROPLASTY Right 05/21/2016   Procedure: RIGHT TOTAL SHOULDER ARTHROPLASTY;  Surgeon: Ninetta Lights, MD;  Location: [place];  Service: Orthopedics;  Laterality: Right;  . TUBAL LIGATION     Social History:   reports that she has never smoked. She has never used smokeless tobacco. She reports that she does not drink alcohol or use drugs.  Family History  Problem  Relation Age of Onset  . Diabetes Mother   . Arthritis Father   . Seizures Sister   . Cancer Sister     breast  . Multiple sclerosis Son     Deceased     Medications: Patient's Medications  New Prescriptions   No medications on file  Previous Medications   ALLOPURINOL (ZYLOPRIM) 100 MG TABLET    Take 100 mg by mouth daily.    AMLODIPINE (NORVASC) 5 MG TABLET    take 1 tablet by mouth once daily   FEEDING SUPPLEMENT, GLUCERNA SHAKE, (GLUCERNA SHAKE) LIQD    Take 237 mLs by mouth 2 (two) times daily between meals.   FERROUS SULFATE 325 (65 FE) MG TABLET    Take 1 tablet (325 mg total) by mouth daily with breakfast.   GLIPIZIDE (GLUCOTROL XL) 5 MG 24 HR TABLET    Take 1 tablet (5 mg total) by mouth daily with breakfast.   HYDROMORPHONE (DILAUDID) 2 MG TABLET    Take 2-4 mg by mouth every 4 (four) hours as needed for moderate pain or severe pain.   LEVOTHYROXINE (SYNTHROID, LEVOTHROID) 100 MCG TABLET    TAKE 1 TABLET BY MOUTH ONCE DAILY BEFORE BREAKFAST   LISINOPRIL-HYDROCHLOROTHIAZIDE (PRINZIDE,ZESTORETIC) 20-12.5 MG TABLET    Take 1 tablet by mouth daily.    ONDANSETRON (ZOFRAN) 4 MG TABLET    Take 1 tablet (4 mg total) by mouth every 8 (eight) hours as needed for nausea or vomiting.   POLYETHYLENE GLYCOL (MIRALAX / GLYCOLAX) PACKET    Take 17 g by mouth daily as needed for mild constipation.   SENNA (SENOKOT) 8.6 MG TABS TABLET    Take 1 tablet (8.6 mg total) by mouth 2 (two) times daily.   TAMOXIFEN (NOLVADEX) 20 MG TABLET    Take 1 tablet (20 mg total) by mouth daily.   TRAMADOL (ULTRAM) 50 MG TABLET    Take 2 tablets (100 mg total) by mouth every 6 (six) hours as needed for moderate pain.  Modified Medications   No medications on file  Discontinued Medications   No medications on file     Physical Exam:  Vitals:   07/03/16 0844  BP: 118/62  Pulse: 78  Temp: 98.4 F (36.9 C)  TempSrc: Oral  Weight: 247 lb 6 oz (112.2 kg)  Height: 5\' 2"  (1.575 m)   Body mass index is  45.25 kg/m.  Physical Exam  Constitutional: She is oriented to person, place, and time. She appears well-developed and well-nourished.  Obese female  Cardiovascular: Normal rate, regular rhythm and normal heart sounds.   Pulmonary/Chest: Effort normal and breath sounds normal. She has no rales.  Abdominal: Soft. Bowel sounds are normal. She exhibits no distension.  Musculoskeletal: She exhibits edema (2+ bilaterally) and tenderness.  Neurological: She is alert and oriented to person, place, and time.  Skin: Skin is warm and dry.  Psychiatric: She has a normal mood and affect.  Labs reviewed: Basic Metabolic Panel:  Recent Labs  01/11/16 1108 04/18/16 1201  05/25/16 0645  05/27/16 0514 05/29/16 1354 06/06/16 0814  NA 146* 142  < > 130*  < > 141 141 140  K 5.0 3.9  < > 3.7  < > 4.3 4.4 4.2  CL 108* 103  < > 100*  < > 109 106 106  CO2 17* 23  < > 22  < > 26 25 24   GLUCOSE 98 92  < > 102*  < > 77 117* 101*  BUN 19 28*  < > 32*  < > 16 14 19   CREATININE 1.16* 1.30*  < > 2.47*  < > 1.07* 1.08* 1.08*  CALCIUM 9.5 9.5  < > 8.5*  < > 9.0 9.1 9.4  MG  --   --   --  1.7  --   --   --   --   PHOS  --   --   --  3.2  --   --   --   --   TSH 2.470 0.574  --  1.442  --   --   --   --   < > = values in this interval not displayed. Liver Function Tests:  Recent Labs  05/24/16 2306 05/25/16 0645 05/27/16 0514  AST 32 35 33  ALT 16 16 23   ALKPHOS 52 51 50  BILITOT 0.7 0.8 0.4  PROT 5.7* 5.3* 5.2*  ALBUMIN 2.4* 2.4* 2.3*   No results for input(s): LIPASE, AMYLASE in the last 8760 hours. No results for input(s): AMMONIA in the last 8760 hours. CBC:  Recent Labs  04/18/16 1201  05/24/16 2306  05/25/16 1638 05/26/16 0357 05/27/16 0514  WBC 5.7  < > 11.9*  < > 7.8 6.0 5.8  NEUTROABS 4.1  --  9.0*  --   --   --   --   HGB  --   < > 9.1*  < > 8.4* 8.2* 8.4*  HCT 36.0  < > 26.0*  < > 24.5* 24.2* 24.9*  MCV 89  < > 83.1  < > 82.8 82.6 83.8  PLT 185  < > 201  < > 215 248 279   < > = values in this interval not displayed. Lipid Panel:  Recent Labs  09/07/15 1519 01/11/16 1108 04/18/16 1201  CHOL 171 180 170  HDL 57 60 60  LDLCALC 88 103* 90  TRIG 129 86 98  CHOLHDL 3.0 3.0 2.8   TSH:  Recent Labs  01/11/16 1108 04/18/16 1201 05/25/16 0645  TSH 2.470 0.574 1.442   A1C: Lab Results  Component Value Date   HGBA1C 5.7 (H) 05/25/2016     Assessment/Plan 1. Bilateral edema of lower extremity -multiple factors contributing to this, pt not sleeping in bed and have legs dependant throughout the day and low protein as well as eating salty foods -encourage to elevate legs when sit -TED hose during the day -increase protein -sleep in bed with legs elevated  2. Essential hypertension, benign Blood pressure remains stable, to cont current regimen. -pt has been restarted on ace/htz  3. Protein calorie malnutrition (HCC) -albumin and protein low, discussed proper food choices that will help increase protein and not to take empty calories -cont on Glucerna nutritional supplement    Ezio Wieck K. Harle Battiest  [facility] [phone] 8 am - 5 pm) [phone] (after hours)

## 2016-07-03 NOTE — Patient Instructions (Addendum)
  Stop bacon and other salty food Elevate legs when sitting Sleep in bed with leg elevated above level to heart To use compression hose during the day, remove at night Make sure to be eating enough protein in your diet   Peripheral Edema You have swelling in your legs (peripheral edema). This swelling is due to excess accumulation of salt and water in your body. Edema may be a sign of heart, kidney or liver disease, or a side effect of a medication. It may also be due to problems in the leg veins. Elevating your legs and using special support stockings may be very helpful, if the cause of the swelling is due to poor venous circulation. Avoid long periods of standing, whatever the cause. Treatment of edema depends on identifying the cause. Chips, pretzels, pickles and other salty foods should be avoided. Restricting salt in your diet is almost always needed. Water pills (diuretics) are often used to remove the excess salt and water from your body via urine. These medicines prevent the kidney from reabsorbing sodium. This increases urine flow. Diuretic treatment may also result in lowering of potassium levels in your body. Potassium supplements may be needed if you have to use diuretics daily. Daily weights can help you keep track of your progress in clearing your edema. You should call your caregiver for follow up care as recommended. SEEK IMMEDIATE MEDICAL CARE IF:   You have increased swelling, pain, redness, or heat in your legs.  You develop shortness of breath, especially when lying down.  You develop chest or abdominal pain, weakness, or fainting.  You have a fever.   This information is not intended to replace advice given to you by your health care provider. Make sure you discuss any questions you have with your health care provider.   Document Released: 12/04/2004 Document Revised: 01/19/2012 Document Reviewed: 05/09/2015 Elsevier Interactive Patient Education [employer].

## 2016-07-07 DIAGNOSIS — Z96611 Presence of right artificial shoulder joint: Secondary | ICD-10-CM | POA: Diagnosis not present

## 2016-07-07 DIAGNOSIS — M25511 Pain in right shoulder: Secondary | ICD-10-CM | POA: Diagnosis not present

## 2016-07-07 DIAGNOSIS — M25611 Stiffness of right shoulder, not elsewhere classified: Secondary | ICD-10-CM | POA: Diagnosis not present

## 2016-07-07 DIAGNOSIS — M19011 Primary osteoarthritis, right shoulder: Secondary | ICD-10-CM | POA: Diagnosis not present

## 2016-07-09 DIAGNOSIS — Z96611 Presence of right artificial shoulder joint: Secondary | ICD-10-CM | POA: Diagnosis not present

## 2016-07-09 DIAGNOSIS — M25611 Stiffness of right shoulder, not elsewhere classified: Secondary | ICD-10-CM | POA: Diagnosis not present

## 2016-07-09 DIAGNOSIS — M19011 Primary osteoarthritis, right shoulder: Secondary | ICD-10-CM | POA: Diagnosis not present

## 2016-07-09 DIAGNOSIS — M25511 Pain in right shoulder: Secondary | ICD-10-CM | POA: Diagnosis not present

## 2016-07-15 ENCOUNTER — Other Ambulatory Visit: Payer: Self-pay | Admitting: Internal Medicine

## 2016-07-15 DIAGNOSIS — M25611 Stiffness of right shoulder, not elsewhere classified: Secondary | ICD-10-CM | POA: Diagnosis not present

## 2016-07-15 DIAGNOSIS — M25511 Pain in right shoulder: Secondary | ICD-10-CM | POA: Diagnosis not present

## 2016-07-15 DIAGNOSIS — Z471 Aftercare following joint replacement surgery: Secondary | ICD-10-CM | POA: Diagnosis not present

## 2016-07-15 DIAGNOSIS — M19011 Primary osteoarthritis, right shoulder: Secondary | ICD-10-CM | POA: Diagnosis not present

## 2016-07-15 NOTE — Telephone Encounter (Signed)
rx sent to pharmacy by e-script  

## 2016-07-17 DIAGNOSIS — M25511 Pain in right shoulder: Secondary | ICD-10-CM | POA: Diagnosis not present

## 2016-07-17 DIAGNOSIS — M19011 Primary osteoarthritis, right shoulder: Secondary | ICD-10-CM | POA: Diagnosis not present

## 2016-07-17 DIAGNOSIS — Z96611 Presence of right artificial shoulder joint: Secondary | ICD-10-CM | POA: Diagnosis not present

## 2016-07-17 DIAGNOSIS — M25611 Stiffness of right shoulder, not elsewhere classified: Secondary | ICD-10-CM | POA: Diagnosis not present

## 2016-07-20 ENCOUNTER — Other Ambulatory Visit: Payer: Self-pay | Admitting: Internal Medicine

## 2016-07-21 DIAGNOSIS — M19011 Primary osteoarthritis, right shoulder: Secondary | ICD-10-CM | POA: Diagnosis not present

## 2016-07-21 DIAGNOSIS — Z96611 Presence of right artificial shoulder joint: Secondary | ICD-10-CM | POA: Diagnosis not present

## 2016-07-21 DIAGNOSIS — M25511 Pain in right shoulder: Secondary | ICD-10-CM | POA: Diagnosis not present

## 2016-07-21 DIAGNOSIS — M25611 Stiffness of right shoulder, not elsewhere classified: Secondary | ICD-10-CM | POA: Diagnosis not present

## 2016-07-23 DIAGNOSIS — M25611 Stiffness of right shoulder, not elsewhere classified: Secondary | ICD-10-CM | POA: Diagnosis not present

## 2016-07-23 DIAGNOSIS — M25511 Pain in right shoulder: Secondary | ICD-10-CM | POA: Diagnosis not present

## 2016-07-23 DIAGNOSIS — Z96611 Presence of right artificial shoulder joint: Secondary | ICD-10-CM | POA: Diagnosis not present

## 2016-07-23 DIAGNOSIS — M19011 Primary osteoarthritis, right shoulder: Secondary | ICD-10-CM | POA: Diagnosis not present

## 2016-07-25 ENCOUNTER — Ambulatory Visit (INDEPENDENT_AMBULATORY_CARE_PROVIDER_SITE_OTHER): Payer: Medicare Other | Admitting: Internal Medicine

## 2016-07-25 ENCOUNTER — Encounter: Payer: Self-pay | Admitting: Internal Medicine

## 2016-07-25 VITALS — BP 122/70 | HR 80 | Temp 97.3°F | Ht 62.0 in | Wt 245.6 lb

## 2016-07-25 DIAGNOSIS — Z23 Encounter for immunization: Secondary | ICD-10-CM | POA: Diagnosis not present

## 2016-07-25 DIAGNOSIS — Z7189 Other specified counseling: Secondary | ICD-10-CM

## 2016-07-25 DIAGNOSIS — I10 Essential (primary) hypertension: Secondary | ICD-10-CM | POA: Diagnosis not present

## 2016-07-25 DIAGNOSIS — M159 Polyosteoarthritis, unspecified: Secondary | ICD-10-CM

## 2016-07-25 DIAGNOSIS — M8949 Other hypertrophic osteoarthropathy, multiple sites: Secondary | ICD-10-CM

## 2016-07-25 DIAGNOSIS — E038 Other specified hypothyroidism: Secondary | ICD-10-CM

## 2016-07-25 DIAGNOSIS — R600 Localized edema: Secondary | ICD-10-CM | POA: Diagnosis not present

## 2016-07-25 DIAGNOSIS — E034 Atrophy of thyroid (acquired): Secondary | ICD-10-CM | POA: Diagnosis not present

## 2016-07-25 DIAGNOSIS — M150 Primary generalized (osteo)arthritis: Secondary | ICD-10-CM | POA: Diagnosis not present

## 2016-07-25 DIAGNOSIS — E46 Unspecified protein-calorie malnutrition: Secondary | ICD-10-CM

## 2016-07-25 MED ORDER — TORSEMIDE 20 MG PO TABS: 20.0000 mg | ORAL_TABLET | Freq: Every day | ORAL | 3 refills | Status: DC

## 2016-07-25 MED ORDER — HYDROMORPHONE HCL 2 MG PO TABS: 2.0000 mg | ORAL_TABLET | ORAL | 0 refills | Status: DC | PRN

## 2016-07-25 NOTE — Progress Notes (Addendum)
Patient ID: Karla Willis, female   DOB: 1935-03-25, 81 y.o.   MRN: [id number]    Location:  [facility] Place of Service: OFFICE  Chief Complaint  Patient presents with  . Medical Management of Chronic Issues    3 month follow up    HPI:  81 yo female seen today for f/u. She c/o severe joint pains - knees, right hand/elbow. She had right total shoulder replacement in July 2017.  She was then readmitted into hospital in July for ARF. Albumin 2.3. She reports unchanged marked edema in b/l LE with occasional aching, worse when lying in bed. She has stiffness in b/l LE. She noted marked swelling in RUE post procedure but that is improving. Occasional clear d/c from right shoulder incision. No bloody or pus d/c.   DM - BS at home stable. No low BS reactions. She takes glipizide xl. She has some swelling in L>R foot. She takes actos. Occasional  numbness or tingling. Last A1c 5.7%. LDL 90  Leg cramps - she had to stop gabapentin due to next day dizziness. She is taking tramadol prn which helps  HTN - stable on amlodipine, lisinopril HCT  Thyroid - stable on levothyroxine. TSH 1.44  Arthritis - stable on tramadol. Takes allopurinol for gout. No recent gout attacks  Hx breast CA - followed by oncology. Last mammogram in Nov 2016 was benign. She takes tamoxifen and has occasional night sweats and hot flashes. No signs of recurrence  Weight loss - albumin 2.3. She gets glucerna. Appetite improved since family moved into larger home. She has gained about 8 lbs.  Past Medical History:  Diagnosis Date  . Allergy   . Arthritis   . 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 Cancer ([place])   . Diabetes mellitus without complication ([address])   . GERD (gastroesophageal reflux disease)    occ  . Gout   . History of hiatal hernia   . HOH (hard of hearing)   . Hypertension   . Hypothyroidism   . Pneumonia    hx  . Thyroid disease   . Wears glasses     Past Surgical History:    Procedure Laterality Date  . BREAST BIOPSY Right    remote, benign  . BREAST LUMPECTOMY WITH NEEDLE LOCALIZATION Right 12/16/2013   Procedure: BREAST LUMPECTOMY WITH NEEDLE LOCALIZATION;  Surgeon: Edward Jolly, MD;  Location: [facility];  Service: General;  Laterality: Right;  . CHOLECYSTECTOMY  1970  . COLONOSCOPY    . FINGER SURGERY     right hand-  . gallbladder sugery    . RE-EXCISION OF BREAST LUMPECTOMY Right 01/30/2014   Procedure: RE-EXCISION OF BREAST LUMPECTOMY;  Surgeon: Edward Jolly, MD;  Location: [facility];  Service: General;  Laterality: Right;  . RE-EXCISION OF BREAST LUMPECTOMY Right 03/06/2014   Procedure: RE-EXCISION OF BREAST LUMPECTOMY;  Surgeon: Edward Jolly, MD;  Location: [facility];  Service: General;  Laterality: Right;  . SHOULDER ARTHROSCOPY  2011   left  . THYROID SURGERY  1970  . TOTAL SHOULDER ARTHROPLASTY Right 05/21/2016  . TOTAL SHOULDER ARTHROPLASTY Right 05/21/2016   Procedure: RIGHT TOTAL SHOULDER ARTHROPLASTY;  Surgeon: Ninetta Lights, MD;  Location: [facility];  Service: Orthopedics;  Laterality: Right;  . TUBAL LIGATION      Patient Care Team: Gildardo Cranker, DO as PCP - General (Internal Medicine) Nicholas Lose, MD as Consulting Physician (Hematology and Oncology)  Social History   Social History  . Marital status: Widowed    Spouse name: N/A  . Number of children: N/A  . Years of education: N/A   Occupational History  . Not on file.   Social History Main Topics  . Smoking status: Never Smoker  . Smokeless tobacco: Never Used  . Alcohol use No  . Drug use: No  . Sexual activity: Not Currently     Comment: menarche age 65, G61, P41, first live birth age 61, menopause at 53, no HRT   Other Topics Concern  . Not on file   Social History Narrative   Diet: No      Do you drink/ eat things with caffeine? Yes      Marital status:    Widowed                           What  year were you married ?  1956      Do you live in a house, apartment,assistred living, condo, trailer, etc.)?  apartment      Is it one or more stories?  one      How many persons live in your home ?  3      Do you have any pets in your home ?(please list)  No      Current or past profession:  Bank, Caregiver      Do you exercise?  No                            Type & how often:        Do you have a living will?  Yes      Do you have a DNR form?  Yes                     If not, do you want to discuss one?       Do you have signed POA?HPOA forms?                 If so, please bring to your        appointment           reports that she has never smoked. She has never used smokeless tobacco. She reports that she does not drink alcohol or use drugs.  Family History  Problem Relation Age of Onset  . Diabetes Mother   . Arthritis Father   . Seizures Sister   . Cancer Sister     breast  . Multiple sclerosis Son     Deceased    Family Status  Relation Status  . Mother Deceased at age 38  . Father Deceased  . Sister Alive   69years old  . Sister Alive   67years old  . Son Alive   60 years old  . Daughter Alive   10 years old  . Son Alive   67 years old  . Daughter Alive   63 years old  . Sister   . Son      Allergies  Allergen Reactions  . Actos [Pioglitazone] Swelling  . Hydrocodone Nausea Only and Other (See Comments)    "bloated"  . Codeine Rash  . Neurontin [Gabapentin] Palpitations and Other (See Comments)    dizziness  . Other Rash    Nuts--PECANS  . Peanut-Containing Drug Products Rash    Medications:  Patient's Medications  New Prescriptions   No medications on file  Previous Medications   ALLOPURINOL (ZYLOPRIM) 100 MG TABLET    take 1 tablet by mouth once daily for GOUT PREVENTION   AMLODIPINE (NORVASC) 5 MG TABLET    take 1 tablet by mouth once daily   FEEDING SUPPLEMENT, GLUCERNA SHAKE, (GLUCERNA SHAKE) LIQD    Take 237 mLs by mouth 2  (two) times daily between meals.   FERROUS SULFATE 325 (65 FE) MG TABLET    Take 1 tablet (325 mg total) by mouth daily with breakfast.   GLIPIZIDE (GLUCOTROL XL) 5 MG 24 HR TABLET    Take 1 tablet (5 mg total) by mouth daily with breakfast.   HYDROMORPHONE (DILAUDID) 2 MG TABLET    Take 2-4 mg by mouth every 4 (four) hours as needed for moderate pain or severe pain.   LEVOTHYROXINE (SYNTHROID, LEVOTHROID) 100 MCG TABLET    TAKE 1 TABLET BY MOUTH ONCE DAILY BEFORE BREAKFAST   LISINOPRIL-HYDROCHLOROTHIAZIDE (PRINZIDE,ZESTORETIC) 20-12.5 MG TABLET    Take 1 tablet by mouth daily.    ONDANSETRON (ZOFRAN) 4 MG TABLET    Take 1 tablet (4 mg total) by mouth every 8 (eight) hours as needed for nausea or vomiting.   POLYETHYLENE GLYCOL (MIRALAX / GLYCOLAX) PACKET    Take 17 g by mouth daily as needed for mild constipation.   SENNA (SENOKOT) 8.6 MG TABS TABLET    Take 1 tablet (8.6 mg total) by mouth 2 (two) times daily.   TAMOXIFEN (NOLVADEX) 20 MG TABLET    Take 1 tablet (20 mg total) by mouth daily.   TRAMADOL (ULTRAM) 50 MG TABLET    Take 2 tablets (100 mg total) by mouth every 6 (six) hours as needed for moderate pain.  Modified Medications   No medications on file  Discontinued Medications   LISINOPRIL-HYDROCHLOROTHIAZIDE (PRINZIDE,ZESTORETIC) 20-12.5 MG TABLET    take 1 tablet by mouth once daily    Review of Systems  HENT: Positive for hearing loss.   Musculoskeletal: Positive for arthralgias, gait problem and joint swelling.  Neurological: Positive for weakness and numbness.  All other systems reviewed and are negative.   Vitals:   07/25/16 1347  BP: 122/70  Pulse: 80  Temp: 97.3 F (36.3 C)  TempSrc: Oral  SpO2: 97%  Weight: 245 lb 9.6 oz (111.4 kg)  Height: _0  (1.575 m)   Body mass index is 44.92 kg/m.  Physical Exam  Constitutional: She is oriented to person, place, and time. She appears well-developed and well-nourished. No distress.  HENT:  Mouth/Throat: Oropharynx is  clear and moist. No oropharyngeal exudate.  Eyes: Pupils are equal, round, and reactive to light. No scleral icterus.  Neck: Neck supple. Carotid bruit is not present. No tracheal deviation present. Thyromegaly (mild) present.  Cardiovascular: Normal rate, regular rhythm and intact distal pulses.  Exam reveals no gallop and no friction rub.   Murmur (2/6 SEM) heard. +2 pitting LE edema. No calf TTP  Pulmonary/Chest: Effort normal and breath sounds normal. No stridor. No respiratory distress. She has no wheezes. She has no rales.  Abdominal: Soft. Bowel sounds are normal. She exhibits no distension and no mass. There is no hepatomegaly. There is no tenderness. There is no rebound and no guarding.  Musculoskeletal: She exhibits edema and tenderness.  Right shoulder reduced ROM and swelling with TTP  Lymphadenopathy:    She has no cervical adenopathy.  Neurological: She is alert and oriented to person, place, and  time.  Skin: Skin is warm and dry. Rash (eczematous rash on right supraclavicular area. no vesicular formation) noted.     Egg sized freely mobile lipoma at sternoclavicular notch; right shoulder incisional scar with tiny area of eczema. No d/c  Psychiatric: She has a normal mood and affect. Her behavior is normal. Judgment and thought content normal.     Labs reviewed: Appointment on 06/06/2016  Component Date Value Ref Range Status  . Sodium 06/06/2016 140  135 - 146 mmol/L Final  . Potassium 06/06/2016 4.2  3.5 - 5.3 mmol/L Final  . Chloride 06/06/2016 106  98 - 110 mmol/L Final  . CO2 06/06/2016 24  20 - 31 mmol/L Final  . Glucose, Bld 06/06/2016 101* 65 - 99 mg/dL Final  . BUN 06/06/2016 19  7 - 25 mg/dL Final  . Creat 06/06/2016 1.08* 0.60 - 0.93 mg/dL Final   Comment:   For patients > or = 79 years of age: The upper reference limit for Creatinine is approximately 13% higher for people identified as African-American.     . Calcium 06/06/2016 9.4  8.6 - 10.4 mg/dL  Final  . GFR, Est African American 06/06/2016 57* >=60 mL/min Final  . GFR, Est Non African American 06/06/2016 49* >=60 mL/min Final  Orders Only on 06/03/2016  Component Date Value Ref Range Status  . Fecal Occult Blood 06/04/2016 NEG  Negative Final  Office Visit on 05/29/2016  Component Date Value Ref Range Status  . Sodium 05/30/2016 141  135 - 146 mmol/L Final  . Potassium 05/30/2016 4.4  3.5 - 5.3 mmol/L Final  . Chloride 05/30/2016 106  98 - 110 mmol/L Final  . CO2 05/30/2016 25  20 - 31 mmol/L Final  . Glucose, Bld 05/30/2016 117* 65 - 99 mg/dL Final  . BUN 05/30/2016 14  7 - 25 mg/dL Final  . Creat 05/30/2016 1.08* 0.60 - 0.93 mg/dL Final   Comment:   For patients > or = 79 years of age: The upper reference limit for Creatinine is approximately 13% higher for people identified as African-American.     . Calcium 05/30/2016 9.1  8.6 - 10.4 mg/dL Final  Admission on 05/24/2016, Discharged on 05/27/2016  Component Date Value Ref Range Status  . WBC 05/24/2016 11.9* 4.0 - 10.5 K/uL Final  . RBC 05/24/2016 3.13* 3.87 - 5.11 MIL/uL Final  . Hemoglobin 05/24/2016 9.1* 12.0 - 15.0 g/dL Final  . HCT 05/24/2016 26.0* 36.0 - 46.0 % Final  . MCV 05/24/2016 83.1  78.0 - 100.0 fL Final  . MCH 05/24/2016 29.1  26.0 - 34.0 pg Final  . MCHC 05/24/2016 35.0  30.0 - 36.0 g/dL Final  . RDW 05/24/2016 14.0  11.5 - 15.5 % Final  . Platelets 05/24/2016 201  150 - 400 K/uL Final  . Neutrophils Relative % 05/24/2016 76  % Final  . Neutro Abs 05/24/2016 9.0* 1.7 - 7.7 K/uL Final  . Lymphocytes Relative 05/24/2016 9  % Final  . Lymphs Abs 05/24/2016 1.1  0.7 - 4.0 K/uL Final  . Monocytes Relative 05/24/2016 14  % Final  . Monocytes Absolute 05/24/2016 1.7* 0.1 - 1.0 K/uL Final  . Eosinophils Relative 05/24/2016 1  % Final  . Eosinophils Absolute 05/24/2016 0.1  0.0 - 0.7 K/uL Final  . Basophils Relative 05/24/2016 0  % Final  . Basophils Absolute 05/24/2016 0.0  0.0 - 0.1 K/uL Final  .  Sodium 05/24/2016 132* 135 - 145 mmol/L Final  . Potassium 05/24/2016  4.0  3.5 - 5.1 mmol/L Final  . Chloride 05/24/2016 99* 101 - 111 mmol/L Final  . CO2 05/24/2016 23  22 - 32 mmol/L Final  . Glucose, Bld 05/24/2016 123* 65 - 99 mg/dL Final  . BUN 05/24/2016 30* 6 - 20 mg/dL Final  . Creatinine, Ser 05/24/2016 2.98* 0.44 - 1.00 mg/dL Final  . Calcium 05/24/2016 9.1  8.9 - 10.3 mg/dL Final  . Total Protein 05/24/2016 5.7* 6.5 - 8.1 g/dL Final  . Albumin 05/24/2016 2.4* 3.5 - 5.0 g/dL Final  . AST 05/24/2016 32  15 - 41 U/L Final  . ALT 05/24/2016 16  14 - 54 U/L Final  . Alkaline Phosphatase 05/24/2016 52  38 - 126 U/L Final  . Total Bilirubin 05/24/2016 0.7  0.3 - 1.2 mg/dL Final  . GFR calc non Af Amer 05/24/2016 14* >60 mL/min Final  . GFR calc Af Amer 05/24/2016 16* >60 mL/min Final   Comment: (NOTE) The eGFR has been calculated using the CKD EPI equation. This calculation has not been validated in all clinical situations. eGFR's persistently <60 mL/min signify possible Chronic Kidney Disease.   . Anion gap 05/24/2016 10  5 - 15 Final  . Color, Urine 05/25/2016 AMBER* YELLOW Final  . APPearance 05/25/2016 CLOUDY* CLEAR Final  . Specific Gravity, Urine 05/25/2016 1.021  1.005 - 1.030 Final  . pH 05/25/2016 5.0  5.0 - 8.0 Final  . Glucose, UA 05/25/2016 NEGATIVE  NEGATIVE mg/dL Final  . Hgb urine dipstick 05/25/2016 NEGATIVE  NEGATIVE Final  . Bilirubin Urine 05/25/2016 SMALL* NEGATIVE Final  . Ketones, ur 05/25/2016 15* NEGATIVE mg/dL Final  . Protein, ur 05/25/2016 NEGATIVE  NEGATIVE mg/dL Final  . Nitrite 05/25/2016 NEGATIVE  NEGATIVE Final  . Leukocytes, UA 05/25/2016 NEGATIVE  NEGATIVE Final  . Creatinine, Urine 05/25/2016 292.31  mg/dL Final  . Sodium, Ur 05/25/2016 <10  mmol/L Final  . Prealbumin 05/25/2016 5.1* 18 - 38 mg/dL Final  . Glucose-Capillary 05/25/2016 111* 65 - 99 mg/dL Final  . Vitamin B-12 05/25/2016 569  180 - 914 pg/mL Final   Comment: (NOTE) This  assay is not validated for testing neonatal or myeloproliferative syndrome specimens for Vitamin B12 levels.   . Folate 05/25/2016 10.1  >5.9 ng/mL Final  . Iron 05/25/2016 15* 28 - 170 ug/dL Final  . TIBC 05/25/2016 155* 250 - 450 ug/dL Final  . Saturation Ratios 05/25/2016 10* 10.4 - 31.8 % Final  . UIBC 05/25/2016 140  ug/dL Final  . Ferritin 05/25/2016 457* 11 - 307 ng/mL Final  . Retic Ct Pct 05/25/2016 1.4  0.4 - 3.1 % Final  . RBC. 05/25/2016 2.60* 3.87 - 5.11 MIL/uL Final  . Retic Count, Manual 05/25/2016 36.4  19.0 - 186.0 K/uL Final  . Magnesium 05/25/2016 1.7  1.7 - 2.4 mg/dL Final  . Phosphorus 05/25/2016 3.2  2.5 - 4.6 mg/dL Final  . TSH 05/25/2016 1.442  0.350 - 4.500 uIU/mL Final  . Sodium 05/25/2016 130* 135 - 145 mmol/L Final  . Potassium 05/25/2016 3.7  3.5 - 5.1 mmol/L Final  . Chloride 05/25/2016 100* 101 - 111 mmol/L Final  . CO2 05/25/2016 22  22 - 32 mmol/L Final  . Glucose, Bld 05/25/2016 102* 65 - 99 mg/dL Final  . BUN 05/25/2016 32* 6 - 20 mg/dL Final  . Creatinine, Ser 05/25/2016 2.47* 0.44 - 1.00 mg/dL Final  . Calcium 05/25/2016 8.5* 8.9 - 10.3 mg/dL Final  . Total Protein 05/25/2016 5.3* 6.5 - 8.1 g/dL  Final  . Albumin 05/25/2016 2.4* 3.5 - 5.0 g/dL Final  . AST 05/25/2016 35  15 - 41 U/L Final  . ALT 05/25/2016 16  14 - 54 U/L Final  . Alkaline Phosphatase 05/25/2016 51  38 - 126 U/L Final  . Total Bilirubin 05/25/2016 0.8  0.3 - 1.2 mg/dL Final  . GFR calc non Af Amer 05/25/2016 18* >60 mL/min Final  . GFR calc Af Amer 05/25/2016 20* >60 mL/min Final   Comment: (NOTE) The eGFR has been calculated using the CKD EPI equation. This calculation has not been validated in all clinical situations. eGFR's persistently <60 mL/min signify possible Chronic Kidney Disease.   . Anion gap 05/25/2016 8  5 - 15 Final  . WBC 05/25/2016 9.0  4.0 - 10.5 K/uL Final  . RBC 05/25/2016 2.60* 3.87 - 5.11 MIL/uL Final  . Hemoglobin 05/25/2016 7.4* 12.0 - 15.0 g/dL Final   . HCT 05/25/2016 21.5* 36.0 - 46.0 % Final  . MCV 05/25/2016 82.7  78.0 - 100.0 fL Final  . MCH 05/25/2016 28.5  26.0 - 34.0 pg Final  . MCHC 05/25/2016 34.4  30.0 - 36.0 g/dL Final  . RDW 05/25/2016 13.5  11.5 - 15.5 % Final  . Platelets 05/25/2016 210  150 - 400 K/uL Final  . Hgb A1c MFr Bld 05/26/2016 5.7* 4.8 - 5.6 % Final   Comment: (NOTE)         Pre-diabetes: 5.7 - 6.4         Diabetes: >6.4         Glycemic control for adults with diabetes: <7.0   . Mean Plasma Glucose 05/26/2016 117  mg/dL Final   Comment: (NOTE) Performed At: [facility] [place], [place] [id number] Lindon Romp MD [id number]   . Weight 05/26/2016 3952  oz Final  . Height 05/26/2016 62  in Final  . BP 05/26/2016 115/60  mmHg Final  . Glucose-Capillary 05/25/2016 102* 65 - 99 mg/dL Final  . Glucose-Capillary 05/25/2016 111* 65 - 99 mg/dL Final  . WBC 05/25/2016 7.8  4.0 - 10.5 K/uL Final  . RBC 05/25/2016 2.96* 3.87 - 5.11 MIL/uL Final  . Hemoglobin 05/25/2016 8.4* 12.0 - 15.0 g/dL Final  . HCT 05/25/2016 24.5* 36.0 - 46.0 % Final  . MCV 05/25/2016 82.8  78.0 - 100.0 fL Final  . MCH 05/25/2016 28.4  26.0 - 34.0 pg Final  . MCHC 05/25/2016 34.3  30.0 - 36.0 g/dL Final  . RDW 05/25/2016 13.6  11.5 - 15.5 % Final  . Platelets 05/25/2016 215  150 - 400 K/uL Final  . ABO/RH(D) 05/25/2016 B POS   Final  . Antibody Screen 05/25/2016 NEG   Final  . Sample Expiration 05/25/2016 05/28/2016   Final  . Sodium 05/26/2016 135  135 - 145 mmol/L Final  . Potassium 05/26/2016 4.1  3.5 - 5.1 mmol/L Final  . Chloride 05/26/2016 106  101 - 111 mmol/L Final  . CO2 05/26/2016 25  22 - 32 mmol/L Final  . Glucose, Bld 05/26/2016 86  65 - 99 mg/dL Final  . BUN 05/26/2016 29* 6 - 20 mg/dL Final  . Creatinine, Ser 05/26/2016 1.66* 0.44 - 1.00 mg/dL Final  . Calcium 05/26/2016 8.7* 8.9 - 10.3 mg/dL Final  . GFR calc non Af Amer 05/26/2016 28* >60 mL/min Final  . GFR calc Af Amer 05/26/2016 33*  >60 mL/min Final   Comment: (NOTE) The eGFR has been calculated using the CKD EPI equation.  This calculation has not been validated in all clinical situations. eGFR's persistently <60 mL/min signify possible Chronic Kidney Disease.   . Anion gap 05/26/2016 4* 5 - 15 Final  . WBC 05/26/2016 6.0  4.0 - 10.5 K/uL Final  . RBC 05/26/2016 2.93* 3.87 - 5.11 MIL/uL Final  . Hemoglobin 05/26/2016 8.2* 12.0 - 15.0 g/dL Final  . HCT 05/26/2016 24.2* 36.0 - 46.0 % Final  . MCV 05/26/2016 82.6  78.0 - 100.0 fL Final  . MCH 05/26/2016 28.0  26.0 - 34.0 pg Final  . MCHC 05/26/2016 33.9  30.0 - 36.0 g/dL Final  . RDW 05/26/2016 13.5  11.5 - 15.5 % Final  . Platelets 05/26/2016 248  150 - 400 K/uL Final  . Glucose-Capillary 05/25/2016 121* 65 - 99 mg/dL Final  . Glucose-Capillary 05/25/2016 127* 65 - 99 mg/dL Final  . Glucose-Capillary 05/26/2016 74  65 - 99 mg/dL Final  . Glucose-Capillary 05/26/2016 101* 65 - 99 mg/dL Final  . Sodium 05/27/2016 141  135 - 145 mmol/L Final  . Potassium 05/27/2016 4.3  3.5 - 5.1 mmol/L Final  . Chloride 05/27/2016 109  101 - 111 mmol/L Final  . CO2 05/27/2016 26  22 - 32 mmol/L Final  . Glucose, Bld 05/27/2016 77  65 - 99 mg/dL Final  . BUN 05/27/2016 16  6 - 20 mg/dL Final  . Creatinine, Ser 05/27/2016 1.07* 0.44 - 1.00 mg/dL Final  . Calcium 05/27/2016 9.0  8.9 - 10.3 mg/dL Final  . Total Protein 05/27/2016 5.2* 6.5 - 8.1 g/dL Final  . Albumin 05/27/2016 2.3* 3.5 - 5.0 g/dL Final  . AST 05/27/2016 33  15 - 41 U/L Final  . ALT 05/27/2016 23  14 - 54 U/L Final  . Alkaline Phosphatase 05/27/2016 50  38 - 126 U/L Final  . Total Bilirubin 05/27/2016 0.4  0.3 - 1.2 mg/dL Final  . GFR calc non Af Amer 05/27/2016 48* >60 mL/min Final  . GFR calc Af Amer 05/27/2016 56* >60 mL/min Final   Comment: (NOTE) The eGFR has been calculated using the CKD EPI equation. This calculation has not been validated in all clinical situations. eGFR's persistently <60 mL/min signify  possible Chronic Kidney Disease.   . Anion gap 05/27/2016 6  5 - 15 Final  . WBC 05/27/2016 5.8  4.0 - 10.5 K/uL Final  . RBC 05/27/2016 2.97* 3.87 - 5.11 MIL/uL Final  . Hemoglobin 05/27/2016 8.4* 12.0 - 15.0 g/dL Final  . HCT 05/27/2016 24.9* 36.0 - 46.0 % Final  . MCV 05/27/2016 83.8  78.0 - 100.0 fL Final  . MCH 05/27/2016 28.3  26.0 - 34.0 pg Final  . MCHC 05/27/2016 33.7  30.0 - 36.0 g/dL Final  . RDW 05/27/2016 13.7  11.5 - 15.5 % Final  . Platelets 05/27/2016 279  150 - 400 K/uL Final  . Glucose-Capillary 05/26/2016 109* 65 - 99 mg/dL Final  . Glucose-Capillary 05/26/2016 115* 65 - 99 mg/dL Final  . Glucose-Capillary 05/27/2016 91  65 - 99 mg/dL Final  . Glucose-Capillary 05/27/2016 119* 65 - 99 mg/dL Final  Admission on 05/21/2016, Discharged on 05/22/2016  Component Date Value Ref Range Status  . Glucose-Capillary 05/21/2016 96  65 - 99 mg/dL Final  . Glucose-Capillary 05/21/2016 182* 65 - 99 mg/dL Final  . Glucose-Capillary 05/21/2016 161* 65 - 99 mg/dL Final  . WBC 05/22/2016 8.3  4.0 - 10.5 K/uL Final  . RBC 05/22/2016 3.00* 3.87 - 5.11 MIL/uL Final  . Hemoglobin 05/22/2016 8.6*  12.0 - 15.0 g/dL Final  . HCT 05/22/2016 25.3* 36.0 - 46.0 % Final  . MCV 05/22/2016 84.3  78.0 - 100.0 fL Final  . MCH 05/22/2016 28.7  26.0 - 34.0 pg Final  . MCHC 05/22/2016 34.0  30.0 - 36.0 g/dL Final  . RDW 05/22/2016 13.9  11.5 - 15.5 % Final  . Platelets 05/22/2016 161  150 - 400 K/uL Final  . Sodium 05/22/2016 134* 135 - 145 mmol/L Final  . Potassium 05/22/2016 3.7  3.5 - 5.1 mmol/L Final  . Chloride 05/22/2016 104  101 - 111 mmol/L Final  . CO2 05/22/2016 24  22 - 32 mmol/L Final  . Glucose, Bld 05/22/2016 140* 65 - 99 mg/dL Final  . BUN 05/22/2016 13  6 - 20 mg/dL Final  . Creatinine, Ser 05/22/2016 1.16* 0.44 - 1.00 mg/dL Final  . Calcium 05/22/2016 8.3* 8.9 - 10.3 mg/dL Final  . GFR calc non Af Amer 05/22/2016 44* >60 mL/min Final  . GFR calc Af Amer 05/22/2016 51* >60 mL/min  Final   Comment: (NOTE) The eGFR has been calculated using the CKD EPI equation. This calculation has not been validated in all clinical situations. eGFR's persistently <60 mL/min signify possible Chronic Kidney Disease.   . Anion gap 05/22/2016 6  5 - 15 Final  . Glucose-Capillary 05/21/2016 140* 65 - 99 mg/dL Final  . Glucose-Capillary 05/22/2016 151* 65 - 99 mg/dL Final  . Glucose-Capillary 05/22/2016 142* 65 - 99 mg/dL Final  Hospital Outpatient Visit on 05/09/2016  Component Date Value Ref Range Status  . Glucose-Capillary 05/09/2016 137* 65 - 99 mg/dL Final  . ABO/RH(D) 05/09/2016 B POS   Final  . Antibody Screen 05/09/2016 NEG   Final  . Sample Expiration 05/09/2016 05/23/2016   Final  . Extend sample reason 05/09/2016 NO TRANSFUSIONS OR PREGNANCY IN THE PAST 3 MONTHS   Final  . WBC 05/09/2016 5.7  4.0 - 10.5 K/uL Final  . RBC 05/09/2016 3.75* 3.87 - 5.11 MIL/uL Final  . Hemoglobin 05/09/2016 10.8* 12.0 - 15.0 g/dL Final  . HCT 05/09/2016 32.2* 36.0 - 46.0 % Final  . MCV 05/09/2016 85.9  78.0 - 100.0 fL Final  . MCH 05/09/2016 28.8  26.0 - 34.0 pg Final  . MCHC 05/09/2016 33.5  30.0 - 36.0 g/dL Final  . RDW 05/09/2016 14.2  11.5 - 15.5 % Final  . Platelets 05/09/2016 185  150 - 400 K/uL Final  . Sodium 05/09/2016 140  135 - 145 mmol/L Final  . Potassium 05/09/2016 3.9  3.5 - 5.1 mmol/L Final  . Chloride 05/09/2016 111  101 - 111 mmol/L Final  . CO2 05/09/2016 23  22 - 32 mmol/L Final  . Glucose, Bld 05/09/2016 140* 65 - 99 mg/dL Final  . BUN 05/09/2016 22* 6 - 20 mg/dL Final  . Creatinine, Ser 05/09/2016 1.20* 0.44 - 1.00 mg/dL Final  . Calcium 05/09/2016 9.1  8.9 - 10.3 mg/dL Final  . GFR calc non Af Amer 05/09/2016 42* >60 mL/min Final  . GFR calc Af Amer 05/09/2016 49* >60 mL/min Final   Comment: (NOTE) The eGFR has been calculated using the CKD EPI equation. This calculation has not been validated in all clinical situations. eGFR's persistently <60 mL/min signify  possible Chronic Kidney Disease.   . Anion gap 05/09/2016 6  5 - 15 Final  . MRSA, PCR 05/09/2016 NEGATIVE  NEGATIVE Final  . Staphylococcus aureus 05/09/2016 POSITIVE* NEGATIVE Final   Comment:  The Xpert SA Assay (FDA approved for NASAL specimens in patients over 39 years of age), is one component of a comprehensive surveillance program.  Test performance has been validated by [facility] for patients greater than or equal to 63 year old. It is not intended to diagnose infection nor to guide or monitor treatment.     No results found.   Assessment/Plan   ICD-9-CM ICD-10-CM   1. Bilateral edema of lower extremity 782.3 R60.0 CMP with eGFR     torsemide (DEMADEX) 20 MG tablet  2. Encounter for immunization Z23 Z23 Flu Vaccine QUAD 36+ mos IM  3. Protein calorie malnutrition (HCC) 263.9 E46 CMP with eGFR     Prealbumin  4. Primary osteoarthritis involving multiple joints 715.09 M15.0 HYDROmorphone (DILAUDID) 2 MG tablet  5. Hypothyroidism due to acquired atrophy of thyroid 244.8 E03.8    246.8 E03.4   6. Essential hypertension, benign 401.1 I10 CMP with eGFR  7. Advanced directives, counseling/discussion V65.49 Z71.89     Take 2 pain pills at least 30 minutes prior to each therapy appt  Cont current meds as ordered  Follow up with specialists as scheduled  Influenza vaccine given today  Advanced directives discussed. DNR and MOST forms completed. Both forms to be scanned into chart  Follow up in 3 mos for routine visit or sooner if you have concerns. Will call with lab results  [facility] S. Perlie Gold  [facility] and [facility] [address] [place], [place] [address] [phone] Cell (Monday-Friday 8 AM - 5 PM) [phone] After 5 PM and follow prompts

## 2016-07-25 NOTE — Patient Instructions (Addendum)
Take 2 pain pills at least 30 minutes prior to each therapy appt  Follow up with specialists as scheduled  Will call with lab results   Follow up in 3 mos for routine visit or sooner if you have concerns. Will call with lab results

## 2016-07-26 LAB — COMPLETE METABOLIC PANEL WITH GFR
ALT: 7 U/L (ref 6–29)
AST: 11 U/L (ref 10–35)
Albumin: 3.7 g/dL (ref 3.6–5.1)
Alkaline Phosphatase: 53 U/L (ref 33–130)
BUN: 24 mg/dL (ref 7–25)
CO2: 24 mmol/L (ref 20–31)
Calcium: 9.4 mg/dL (ref 8.6–10.4)
Chloride: 107 mmol/L (ref 98–110)
Creat: 1.13 mg/dL — ABNORMAL HIGH (ref 0.60–0.93)
GFR, Est African American: 54 mL/min — ABNORMAL LOW (ref >=60)
GFR, Est Non African American: 47 mL/min — ABNORMAL LOW (ref >=60)
Glucose, Bld: 87 mg/dL (ref 65–99)
Potassium: 4.3 mmol/L (ref 3.5–5.3)
Sodium: 141 mmol/L (ref 135–146)
Total Bilirubin: 0.4 mg/dL (ref 0.2–1.2)
Total Protein: 6.7 g/dL (ref 6.1–8.1)

## 2016-07-28 DIAGNOSIS — M19011 Primary osteoarthritis, right shoulder: Secondary | ICD-10-CM | POA: Diagnosis not present

## 2016-07-28 DIAGNOSIS — Z96611 Presence of right artificial shoulder joint: Secondary | ICD-10-CM | POA: Diagnosis not present

## 2016-07-28 DIAGNOSIS — M25511 Pain in right shoulder: Secondary | ICD-10-CM | POA: Diagnosis not present

## 2016-07-28 DIAGNOSIS — M25611 Stiffness of right shoulder, not elsewhere classified: Secondary | ICD-10-CM | POA: Diagnosis not present

## 2016-07-28 LAB — PREALBUMIN: Prealbumin: 17 mg/dL (ref 17–34)

## 2016-07-30 DIAGNOSIS — Z96611 Presence of right artificial shoulder joint: Secondary | ICD-10-CM | POA: Diagnosis not present

## 2016-07-30 DIAGNOSIS — M19011 Primary osteoarthritis, right shoulder: Secondary | ICD-10-CM | POA: Diagnosis not present

## 2016-07-30 DIAGNOSIS — M25611 Stiffness of right shoulder, not elsewhere classified: Secondary | ICD-10-CM | POA: Diagnosis not present

## 2016-07-30 DIAGNOSIS — M25511 Pain in right shoulder: Secondary | ICD-10-CM | POA: Diagnosis not present

## 2016-08-02 ENCOUNTER — Other Ambulatory Visit: Payer: Self-pay | Admitting: Nurse Practitioner

## 2016-08-04 DIAGNOSIS — M19011 Primary osteoarthritis, right shoulder: Secondary | ICD-10-CM | POA: Diagnosis not present

## 2016-08-04 DIAGNOSIS — M25611 Stiffness of right shoulder, not elsewhere classified: Secondary | ICD-10-CM | POA: Diagnosis not present

## 2016-08-04 DIAGNOSIS — Z96611 Presence of right artificial shoulder joint: Secondary | ICD-10-CM | POA: Diagnosis not present

## 2016-08-04 DIAGNOSIS — M25511 Pain in right shoulder: Secondary | ICD-10-CM | POA: Diagnosis not present

## 2016-08-06 DIAGNOSIS — M25511 Pain in right shoulder: Secondary | ICD-10-CM | POA: Diagnosis not present

## 2016-08-06 DIAGNOSIS — M25611 Stiffness of right shoulder, not elsewhere classified: Secondary | ICD-10-CM | POA: Diagnosis not present

## 2016-08-06 DIAGNOSIS — Z96611 Presence of right artificial shoulder joint: Secondary | ICD-10-CM | POA: Diagnosis not present

## 2016-08-06 DIAGNOSIS — M19011 Primary osteoarthritis, right shoulder: Secondary | ICD-10-CM | POA: Diagnosis not present

## 2016-08-11 DIAGNOSIS — M19011 Primary osteoarthritis, right shoulder: Secondary | ICD-10-CM | POA: Diagnosis not present

## 2016-08-11 DIAGNOSIS — Z471 Aftercare following joint replacement surgery: Secondary | ICD-10-CM | POA: Diagnosis not present

## 2016-08-11 DIAGNOSIS — M25511 Pain in right shoulder: Secondary | ICD-10-CM | POA: Diagnosis not present

## 2016-08-11 DIAGNOSIS — M25611 Stiffness of right shoulder, not elsewhere classified: Secondary | ICD-10-CM | POA: Diagnosis not present

## 2016-08-13 DIAGNOSIS — M25511 Pain in right shoulder: Secondary | ICD-10-CM | POA: Diagnosis not present

## 2016-08-13 DIAGNOSIS — M19011 Primary osteoarthritis, right shoulder: Secondary | ICD-10-CM | POA: Diagnosis not present

## 2016-08-13 DIAGNOSIS — Z96611 Presence of right artificial shoulder joint: Secondary | ICD-10-CM | POA: Diagnosis not present

## 2016-08-13 DIAGNOSIS — M25611 Stiffness of right shoulder, not elsewhere classified: Secondary | ICD-10-CM | POA: Diagnosis not present

## 2016-08-15 IMAGING — CR DG CHEST 1V PORT
1 series · 1 of 1 positions shown · non-contrast
Comparison: Chest x-ray 03/03/2011.

CLINICAL DATA: 80-year-old female with shortness of breath on
exertion. Dry cough. Fever with acute renal failure.

EXAM:
PORTABLE CHEST 1 VIEW

[AP]
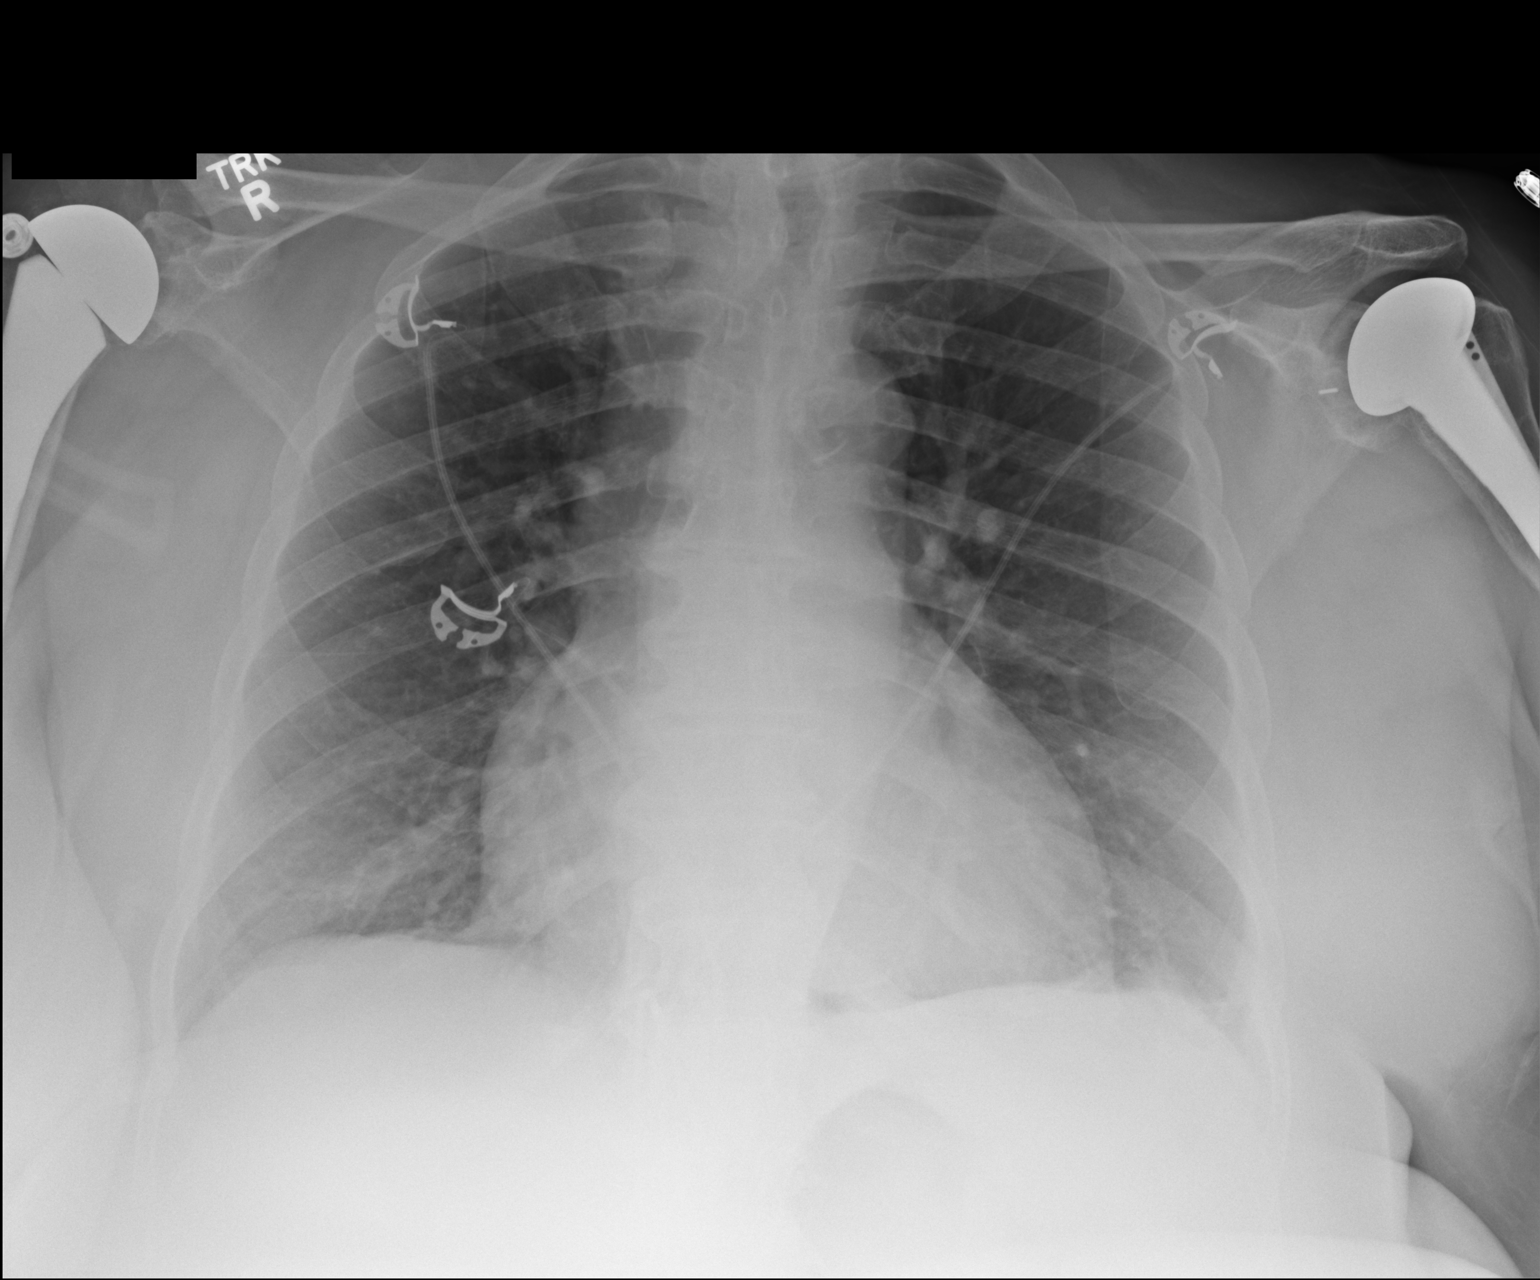

[1 of 1 positions shown; findings below may reference images not displayed]

FINDINGS: Lung volumes are low. No consolidative airspace disease. No pleural
effusions. No evidence of pulmonary edema. Mild cardiomegaly. Upper
mediastinal contours are again remarkable for some prominence of
soft tissue in the high right paratracheal station at the level of
the thoracic inlet, which causes some deviation of the proximal
trachea, similar to prior study from 03/03/2011. Atherosclerosis in
the thoracic aorta. Bilateral shoulder arthroplasties.
IMPRESSION: 1. No radiographic evidence of acute cardiopulmonary disease.
2. Mild cardiomegaly.
3. Aortic atherosclerosis.
4. Prominent soft tissue at the level of the thoracic inlet, similar
to prior study, likely related to right sided thyroid enlargement.
Correlation with patient's history (note is made of prior
ultrasound-guided biopsy of right thyroid nodule on 04/18/2015).

## 2016-08-19 ENCOUNTER — Other Ambulatory Visit: Payer: Self-pay | Admitting: Internal Medicine

## 2016-08-19 DIAGNOSIS — E119 Type 2 diabetes mellitus without complications: Secondary | ICD-10-CM

## 2016-08-26 DIAGNOSIS — M19011 Primary osteoarthritis, right shoulder: Secondary | ICD-10-CM | POA: Diagnosis not present

## 2016-08-26 DIAGNOSIS — M25511 Pain in right shoulder: Secondary | ICD-10-CM | POA: Diagnosis not present

## 2016-08-26 DIAGNOSIS — Z96611 Presence of right artificial shoulder joint: Secondary | ICD-10-CM | POA: Diagnosis not present

## 2016-08-26 DIAGNOSIS — M25611 Stiffness of right shoulder, not elsewhere classified: Secondary | ICD-10-CM | POA: Diagnosis not present

## 2016-08-27 DIAGNOSIS — M25611 Stiffness of right shoulder, not elsewhere classified: Secondary | ICD-10-CM | POA: Diagnosis not present

## 2016-08-27 DIAGNOSIS — M25511 Pain in right shoulder: Secondary | ICD-10-CM | POA: Diagnosis not present

## 2016-08-27 DIAGNOSIS — M19011 Primary osteoarthritis, right shoulder: Secondary | ICD-10-CM | POA: Diagnosis not present

## 2016-08-27 DIAGNOSIS — Z96611 Presence of right artificial shoulder joint: Secondary | ICD-10-CM | POA: Diagnosis not present

## 2016-09-01 DIAGNOSIS — M25611 Stiffness of right shoulder, not elsewhere classified: Secondary | ICD-10-CM | POA: Diagnosis not present

## 2016-09-01 DIAGNOSIS — Z96611 Presence of right artificial shoulder joint: Secondary | ICD-10-CM | POA: Diagnosis not present

## 2016-09-01 DIAGNOSIS — M19011 Primary osteoarthritis, right shoulder: Secondary | ICD-10-CM | POA: Diagnosis not present

## 2016-09-01 DIAGNOSIS — M25511 Pain in right shoulder: Secondary | ICD-10-CM | POA: Diagnosis not present

## 2016-09-02 ENCOUNTER — Other Ambulatory Visit: Payer: Self-pay

## 2016-09-02 MED ORDER — TAMOXIFEN CITRATE 20 MG PO TABS: 20.0000 mg | ORAL_TABLET | Freq: Every day | ORAL | 3 refills | Status: DC

## 2016-09-03 DIAGNOSIS — M25611 Stiffness of right shoulder, not elsewhere classified: Secondary | ICD-10-CM | POA: Diagnosis not present

## 2016-09-03 DIAGNOSIS — M19011 Primary osteoarthritis, right shoulder: Secondary | ICD-10-CM | POA: Diagnosis not present

## 2016-09-03 DIAGNOSIS — M25511 Pain in right shoulder: Secondary | ICD-10-CM | POA: Diagnosis not present

## 2016-09-03 DIAGNOSIS — Z96611 Presence of right artificial shoulder joint: Secondary | ICD-10-CM | POA: Diagnosis not present

## 2016-09-08 DIAGNOSIS — M25611 Stiffness of right shoulder, not elsewhere classified: Secondary | ICD-10-CM | POA: Diagnosis not present

## 2016-09-08 DIAGNOSIS — Z96611 Presence of right artificial shoulder joint: Secondary | ICD-10-CM | POA: Diagnosis not present

## 2016-09-08 DIAGNOSIS — M25511 Pain in right shoulder: Secondary | ICD-10-CM | POA: Diagnosis not present

## 2016-09-08 DIAGNOSIS — M19011 Primary osteoarthritis, right shoulder: Secondary | ICD-10-CM | POA: Diagnosis not present

## 2016-09-10 DIAGNOSIS — M25611 Stiffness of right shoulder, not elsewhere classified: Secondary | ICD-10-CM | POA: Diagnosis not present

## 2016-09-10 DIAGNOSIS — Z96611 Presence of right artificial shoulder joint: Secondary | ICD-10-CM | POA: Diagnosis not present

## 2016-09-10 DIAGNOSIS — M19011 Primary osteoarthritis, right shoulder: Secondary | ICD-10-CM | POA: Diagnosis not present

## 2016-09-10 DIAGNOSIS — M25511 Pain in right shoulder: Secondary | ICD-10-CM | POA: Diagnosis not present

## 2016-09-29 ENCOUNTER — Telehealth: Payer: Self-pay

## 2016-09-29 NOTE — Telephone Encounter (Signed)
Patient called c/o cough (non- productive), chills, weak, dizziness, vomiting episodes x 2 and overall not feeling well. Patient denies fever. Patient checked her B/P 106/63.  Patient tried Vicks cough medication.    No available appointment's this week please advise

## 2016-09-29 NOTE — Telephone Encounter (Signed)
She really needs to be evaluated by a provider. Recommend she goes to urgent care

## 2016-09-30 ENCOUNTER — Ambulatory Visit (INDEPENDENT_AMBULATORY_CARE_PROVIDER_SITE_OTHER): Payer: Medicare Other | Admitting: Nurse Practitioner

## 2016-09-30 ENCOUNTER — Encounter: Payer: Self-pay | Admitting: Nurse Practitioner

## 2016-09-30 VITALS — BP 116/62 | HR 100 | Temp 97.5°F | Ht 62.0 in | Wt 224.0 lb

## 2016-09-30 DIAGNOSIS — J069 Acute upper respiratory infection, unspecified: Secondary | ICD-10-CM | POA: Diagnosis not present

## 2016-09-30 LAB — CBC WITH DIFFERENTIAL/PLATELET
Basophils Absolute: 0 cells/uL (ref 0–200)
Basophils Relative: 0 %
Eosinophils Absolute: 146 cells/uL (ref 15–500)
Eosinophils Relative: 2 %
HCT: 37 % (ref 35.0–45.0)
Hemoglobin: 11.7 g/dL (ref 11.7–15.5)
Lymphocytes Relative: 18 %
Lymphs Abs: 1314 cells/uL (ref 850–3900)
MCH: 27 pg (ref 27.0–33.0)
MCHC: 31.6 g/dL — ABNORMAL LOW (ref 32.0–36.0)
MCV: 85.5 fL (ref 80.0–100.0)
MPV: 10.8 fL (ref 7.5–12.5)
Monocytes Absolute: 657 cells/uL (ref 200–950)
Monocytes Relative: 9 %
Neutro Abs: 5183 cells/uL (ref 1500–7800)
Neutrophils Relative %: 71 %
Platelets: 226 10*3/uL (ref 140–400)
RBC: 4.33 MIL/uL (ref 3.80–5.10)
RDW: 17.7 % — ABNORMAL HIGH (ref 11.0–15.0)
WBC: 7.3 10*3/uL (ref 3.8–10.8)

## 2016-09-30 LAB — BASIC METABOLIC PANEL WITH GFR
BUN: 79 mg/dL — ABNORMAL HIGH (ref 7–25)
CO2: 24 mmol/L (ref 20–31)
Calcium: 9.6 mg/dL (ref 8.6–10.4)
Chloride: 101 mmol/L (ref 98–110)
Creat: 2.54 mg/dL — ABNORMAL HIGH (ref 0.60–0.93)
GFR, Est African American: 20 mL/min — ABNORMAL LOW (ref >=60)
GFR, Est Non African American: 18 mL/min — ABNORMAL LOW (ref >=60)
Glucose, Bld: 134 mg/dL — ABNORMAL HIGH (ref 65–99)
Potassium: 4.4 mmol/L (ref 3.5–5.3)
Sodium: 139 mmol/L (ref 135–146)

## 2016-09-30 MED ORDER — AMOXICILLIN-POT CLAVULANATE 875-125 MG PO TABS: 1.0000 | ORAL_TABLET | Freq: Two times a day (BID) | ORAL | 0 refills | Status: AC

## 2016-09-30 NOTE — Telephone Encounter (Signed)
Appointment was available for Karla Mustache, NP at 3:45 pm today. Patient scheduled, patient will arrive at 3:30 pm for check-in

## 2016-09-30 NOTE — Patient Instructions (Addendum)
Stay hydrated, increase water intake while not feeling well mucinex DM 1 tablet twice daily with full glass of water for cough and congestion augmentin 1 tablet twice daily for 7 days  If symptoms worsen or fail to improve go to the emergency department/ URGENT CARE   Sinusitis, Adult Sinusitis is soreness and inflammation of your sinuses. Sinuses are hollow spaces in the bones around your face. They are located:  Around your eyes.  In the middle of your forehead.  Behind your nose.  In your cheekbones. Your sinuses and nasal passages are lined with a stringy fluid (mucus). Mucus normally drains out of your sinuses. When your nasal tissues get inflamed or swollen, the mucus can get trapped or blocked so air cannot flow through your sinuses. This lets bacteria, viruses, and funguses grow, and that leads to infection. Follow these instructions at home: Medicines  Take, use, or apply over-the-counter and prescription medicines only as told by your doctor. These may include nasal sprays.  If you were prescribed an antibiotic medicine, take it as told by your doctor. Do not stop taking the antibiotic even if you start to feel better. Hydrate and Humidify  Drink enough water to keep your pee (urine) clear or pale yellow.  Use a cool mist humidifier to keep the humidity level in your home above 50%.  Breathe in steam for 10-15 minutes, 3-4 times a day or as told by your doctor. You can do this in the bathroom while a hot shower is running.  Try not to spend time in cool or dry air. Rest  Rest as much as possible.  Sleep with your head raised (elevated).  Make sure to get enough sleep each night. General instructions  Put a warm, moist washcloth on your face 3-4 times a day or as told by your doctor. This will help with discomfort.  Wash your hands often with soap and water. If there is no soap and water, use hand sanitizer.  Do not smoke. Avoid being around people who are  smoking (secondhand smoke).  Keep all follow-up visits as told by your doctor. This is important. Contact a doctor if:  You have a fever.  Your symptoms get worse.  Your symptoms do not get better within 10 days. Get help right away if:  You have a very bad headache.  You cannot stop throwing up (vomiting).  You have pain or swelling around your face or eyes.  You have trouble seeing.  You feel confused.  Your neck is stiff.  You have trouble breathing. This information is not intended to replace advice given to you by your health care provider. Make sure you discuss any questions you have with your health care provider. Document Released: 04/14/2008 Document Revised: 06/22/2016 Document Reviewed: 08/22/2015 Elsevier Interactive Patient Education  2017 [employer].

## 2016-09-30 NOTE — Progress Notes (Signed)
Careteam: Patient Care Team: Gildardo Cranker, DO as PCP - General (Internal Medicine) Nicholas Lose, MD as Consulting Physician (Hematology and Oncology)    Allergies  Allergen Reactions  . Actos [Pioglitazone] Swelling  . Hydrocodone Nausea Only and Other (See Comments)    "bloated"  . Codeine Rash  . Neurontin [Gabapentin] Palpitations and Other (See Comments)    dizziness  . Other Rash    Nuts--PECANS  . Peanut-Containing Drug Products Rash    Chief Complaint  Patient presents with  . Acute Visit    URI non productive cough, chills, weak and 2 vomiting episodes x 1 week. Tried Vicks 56 OTC     HPI: Patient is a 81 y.o. female seen in the office today  8 days ago Had self-diagnosed bronchitis that began 8 days ago and couldn't talk. The patient could not keep anything down last week, and vomited several times. Currently no nausea/vomiting/diarrhea She is still hoarse, has a nonproductive cough, sneezing, runny nose, nasal congestion, and chills. Denies facial pain or pressure, and fever. She had a sore throat but it has resolved. She began getting dizzy and feeling weak last week, which has continued. BP at home 100s/60s. Pulse over 100 the past couple days. No energy. Decreased appetite stating that nothing tastes good. The only medication the patient tried was vicks 44, which she reports was not effective. Overall patient feels a little better, but still pretty bad.     Review of Systems:  Review of Systems  Constitutional: Positive for appetite change (decreased) and chills. Negative for fever.  HENT: Positive for congestion, rhinorrhea, sneezing and voice change. Negative for sinus pain, sinus pressure and sore throat.   Respiratory: Positive for cough. Negative for chest tightness, shortness of breath and wheezing.   Cardiovascular: Negative for chest pain.  Gastrointestinal: Positive for nausea and vomiting (last week). Negative for diarrhea.  Neurological:  Positive for dizziness and weakness.    Past Medical History:  Diagnosis Date  . Allergy   . Arthritis   . 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 Cancer ([place])   . Diabetes mellitus without complication ([place])   . GERD (gastroesophageal reflux disease)    occ  . Gout   . History of hiatal hernia   . HOH (hard of hearing)   . Hypertension   . Hypothyroidism   . Pneumonia    hx  . Thyroid disease   . Wears glasses    Past Surgical History:  Procedure Laterality Date  . BREAST BIOPSY Right    remote, benign  . BREAST LUMPECTOMY WITH NEEDLE LOCALIZATION Right 12/16/2013   Procedure: BREAST LUMPECTOMY WITH NEEDLE LOCALIZATION;  Surgeon: Edward Jolly, MD;  Location: [facility];  Service: General;  Laterality: Right;  . CHOLECYSTECTOMY  1970  . COLONOSCOPY    . FINGER SURGERY     right hand-  . gallbladder sugery    . RE-EXCISION OF BREAST LUMPECTOMY Right 01/30/2014   Procedure: RE-EXCISION OF BREAST LUMPECTOMY;  Surgeon: Edward Jolly, MD;  Location: [facility];  Service: General;  Laterality: Right;  . RE-EXCISION OF BREAST LUMPECTOMY Right 03/06/2014   Procedure: RE-EXCISION OF BREAST LUMPECTOMY;  Surgeon: Edward Jolly, MD;  Location: [facility];  Service: General;  Laterality: Right;  . SHOULDER ARTHROSCOPY  2011   left  . THYROID SURGERY  1970  . TOTAL SHOULDER ARTHROPLASTY Right 05/21/2016  . TOTAL SHOULDER ARTHROPLASTY  Right 05/21/2016   Procedure: RIGHT TOTAL SHOULDER ARTHROPLASTY;  Surgeon: Ninetta Lights, MD;  Location: [facility];  Service: Orthopedics;  Laterality: Right;  . TUBAL LIGATION     Social History:   reports that she has never smoked. She has never used smokeless tobacco. She reports that she does not drink alcohol or use drugs.  Family History  Problem Relation Age of Onset  . Diabetes Mother   . Arthritis Father   . Seizures Sister   . Cancer Sister     breast  .  Multiple sclerosis Son     Deceased     Medications: Patient's Medications  New Prescriptions   No medications on file  Previous Medications   ALLOPURINOL (ZYLOPRIM) 100 MG TABLET    take 1 tablet by mouth once daily for GOUT PREVENTION   AMLODIPINE (NORVASC) 5 MG TABLET    take 1 tablet by mouth once daily   FEEDING SUPPLEMENT, GLUCERNA SHAKE, (GLUCERNA SHAKE) LIQD    Take 237 mLs by mouth 2 (two) times daily between meals.   FERROUS SULFATE 325 (65 FE) MG TABLET    Take 1 tablet (325 mg total) by mouth daily with breakfast.   GLIPIZIDE (GLUCOTROL XL) 5 MG 24 HR TABLET    take 1 tablet by mouth once daily WITH BREAKFAST   HYDROMORPHONE (DILAUDID) 2 MG TABLET    Take 1-2 tablets (2-4 mg total) by mouth every 4 (four) hours as needed for moderate pain or severe pain.   LEVOTHYROXINE (SYNTHROID, LEVOTHROID) 100 MCG TABLET    TAKE 1 TABLET BY MOUTH ONCE DAILY BEFORE BREAKFAST   LISINOPRIL-HYDROCHLOROTHIAZIDE (PRINZIDE,ZESTORETIC) 20-12.5 MG TABLET    Take 1 tablet by mouth daily.    ONDANSETRON (ZOFRAN) 4 MG TABLET    Take 1 tablet (4 mg total) by mouth every 8 (eight) hours as needed for nausea or vomiting.   POLYETHYLENE GLYCOL (MIRALAX / GLYCOLAX) PACKET    Take 17 g by mouth daily as needed for mild constipation.   SENNA (SENOKOT) 8.6 MG TABS TABLET    Take 1 tablet (8.6 mg total) by mouth 2 (two) times daily.   TAMOXIFEN (NOLVADEX) 20 MG TABLET    Take 1 tablet (20 mg total) by mouth daily.   TORSEMIDE (DEMADEX) 20 MG TABLET    Take 1 tablet (20 mg total) by mouth daily.   TRAMADOL (ULTRAM) 50 MG TABLET    take 1 tablet by mouth three times a day with meals if needed for MODERATE TO SEVERE PAIN  Modified Medications   No medications on file  Discontinued Medications   No medications on file     Physical Exam:  Vitals:   09/30/16 1556  BP: 116/62  Pulse: 100  Temp: 97.5 F (36.4 C)  TempSrc: Oral  SpO2: 98%  Weight: 224 lb (101.6 kg)  Height: '5\' 2"'$  (1.575 m)   Body mass  index is 40.97 kg/m.  Physical Exam  Constitutional: She is oriented to person, place, and time. She appears well-developed and well-nourished.  HENT:  Head: Normocephalic.  Right Ear: External ear normal.  Left Ear: External ear normal.  Mouth/Throat: Oropharynx is clear and moist.  Eyes: Pupils are equal, round, and reactive to light.  Neck: Normal range of motion.  Cardiovascular: Normal rate, regular rhythm and normal heart sounds.   Pulmonary/Chest: Effort normal and breath sounds normal.  Abdominal: Soft. Bowel sounds are normal.  Lymphadenopathy:    She has no cervical adenopathy.  Neurological: She  is alert and oriented to person, place, and time.  Skin: Skin is warm and dry.  Psychiatric: She has a normal mood and affect. Her behavior is normal. Judgment and thought content normal.    Labs reviewed: Basic Metabolic Panel:  Recent Labs  01/11/16 1108 04/18/16 1201  05/25/16 0645  05/29/16 1354 06/06/16 0814 07/25/16 1454  NA 146* 142  < > 130*  < > 141 140 141  K 5.0 3.9  < > 3.7  < > 4.4 4.2 4.3  CL 108* 103  < > 100*  < > 106 106 107  CO2 17* 23  < > 22  < > '25 24 24  '$ GLUCOSE 98 92  < > 102*  < > 117* 101* 87  BUN 19 28*  < > 32*  < > '14 19 24  '$ CREATININE 1.16* 1.30*  < > 2.47*  < > 1.08* 1.08* 1.13*  CALCIUM 9.5 9.5  < > 8.5*  < > 9.1 9.4 9.4  MG  --   --   --  1.7  --   --   --   --   PHOS  --   --   --  3.2  --   --   --   --   TSH 2.470 0.574  --  1.442  --   --   --   --   < > = values in this interval not displayed. Liver Function Tests:  Recent Labs  05/25/16 0645 05/27/16 0514 07/25/16 1454  AST 35 33 11  ALT '16 23 7  '$ ALKPHOS 51 50 53  BILITOT 0.8 0.4 0.4  PROT 5.3* 5.2* 6.7  ALBUMIN 2.4* 2.3* 3.7   No results for input(s): LIPASE, AMYLASE in the last 8760 hours. No results for input(s): AMMONIA in the last 8760 hours. CBC:  Recent Labs  04/18/16 1201  05/24/16 2306  05/25/16 1638 05/26/16 0357 05/27/16 0514  WBC 5.7  < > 11.9*   < > 7.8 6.0 5.8  NEUTROABS 4.1  --  9.0*  --   --   --   --   HGB  --   < > 9.1*  < > 8.4* 8.2* 8.4*  HCT 36.0  < > 26.0*  < > 24.5* 24.2* 24.9*  MCV 89  < > 83.1  < > 82.8 82.6 83.8  PLT 185  < > 201  < > 215 248 279  < > = values in this interval not displayed. Lipid Panel:  Recent Labs  01/11/16 1108 04/18/16 1201  CHOL 180 170  HDL 60 60  LDLCALC 103* 90  TRIG 86 98  CHOLHDL 3.0 2.8   TSH:  Recent Labs  01/11/16 1108 04/18/16 1201 05/25/16 0645  TSH 2.470 0.574 1.442   A1C: Lab Results  Component Value Date   HGBA1C 5.7 (H) 05/25/2016     Assessment/Plan 1. Acute URI - encouraged proper hydration, increase fluid intake.  - BMP with eGFR - CBC with Differential/Platelets - amoxicillin-clavulanate (AUGMENTIN) 875-125 MG tablet; Take 1 tablet by mouth 2 (two) times daily.  Dispense: 14 tablet; Refill: 0 - to take with food -to use probiotic twice daily for 2 weeks.  - educated on when to seek medical attention, to go to the ED or urgent care if symptoms worsen or do not improve or unable to take down food or water  Aram Domzalski K. Harle Battiest  [facility] & Adult Medicine [phone] 8 am - 5  pm) [phone] (after hours)

## 2016-10-06 ENCOUNTER — Encounter: Payer: Self-pay | Admitting: Nurse Practitioner

## 2016-10-06 ENCOUNTER — Ambulatory Visit (INDEPENDENT_AMBULATORY_CARE_PROVIDER_SITE_OTHER): Payer: Medicare Other | Admitting: Nurse Practitioner

## 2016-10-06 VITALS — BP 126/82 | HR 97 | Temp 97.8°F | Resp 17 | Ht 62.0 in | Wt 226.8 lb

## 2016-10-06 DIAGNOSIS — R600 Localized edema: Secondary | ICD-10-CM | POA: Diagnosis not present

## 2016-10-06 DIAGNOSIS — N289 Disorder of kidney and ureter, unspecified: Secondary | ICD-10-CM

## 2016-10-06 DIAGNOSIS — J069 Acute upper respiratory infection, unspecified: Secondary | ICD-10-CM | POA: Diagnosis not present

## 2016-10-06 DIAGNOSIS — I10 Essential (primary) hypertension: Secondary | ICD-10-CM

## 2016-10-06 LAB — BASIC METABOLIC PANEL WITH GFR
BUN: 44 mg/dL — ABNORMAL HIGH (ref 7–25)
CO2: 28 mmol/L (ref 20–31)
Calcium: 9.5 mg/dL (ref 8.6–10.4)
Chloride: 107 mmol/L (ref 98–110)
Creat: 1.31 mg/dL — ABNORMAL HIGH (ref 0.60–0.93)
GFR, Est African American: 45 mL/min — ABNORMAL LOW (ref >=60)
GFR, Est Non African American: 39 mL/min — ABNORMAL LOW (ref >=60)
Glucose, Bld: 94 mg/dL (ref 65–99)
Potassium: 4.4 mmol/L (ref 3.5–5.3)
Sodium: 141 mmol/L (ref 135–146)

## 2016-10-06 NOTE — Patient Instructions (Addendum)
Finish antibiotics as prescribed.   Tylenol 650 mg every 6 hours as needed for the headache. Do not take more than 9 tablets in 24 hours.   Cont to make sure you stay hydrated, we will be in touch with lab results

## 2016-10-06 NOTE — Progress Notes (Signed)
Careteam: Patient Care Team: Gildardo Cranker, DO as PCP - General (Internal Medicine) Nicholas Lose, MD as Consulting Physician (Hematology and Oncology)  Advanced Directive information Does Patient Have a Medical Advance Directive?: Yes, Type of Advance Directive: [place];Living will  Allergies  Allergen Reactions  . Actos [Pioglitazone] Swelling  . Hydrocodone Nausea Only and Other (See Comments)    "bloated"  . Codeine Rash  . Neurontin [Gabapentin] Palpitations and Other (See Comments)    dizziness  . Other Rash    Nuts--PECANS  . Peanut-Containing Drug Products Rash    Chief Complaint  Patient presents with  . Acute Visit    Pt felt dehydrated but is feeling better now. Pt is drinking 8 oz of water every 2 hours     HPI: Patient is a 81 y.o. female seen in the office today to follow-up on her kidney function. Pt was seen on 09/30/16 due to acute URI,  when lab resulted renal function had worsened, Cr went from 1.13 to 2.54. Pt had also had demadex added due to LE edema in September. Demadex, lisinopril/hctz was stopped and pt was instructed to significantly increase fluid intake. She is here today to follow up on this. The patient reports she was able to increase her fluid intake after being notified on Wednesday (5 days ago). She has been drinking throughout the day as opposed to only with meals as she was before. Denies dizziness, weakness, nausea, vomiting, sneezing, and nasal congestion. The patient reports her appetite has returned. No increase in LE edema since she has stopped Demadex.  She does continue to have a nagging headache, productive cough, but with clear sputum and a slight runny nose. Overall the patient states she feels much better.   Review of Systems:  Review of Systems  Constitutional: Positive for appetite change (appetite has returned). Negative for chills, fatigue, fever and unexpected weight change.  HENT: Positive for  rhinorrhea. Negative for congestion, postnasal drip, sinus pain, sinus pressure, sneezing, sore throat and voice change.   Respiratory: Positive for cough (clear sputum). Negative for shortness of breath and wheezing.   Cardiovascular: Negative for chest pain and leg swelling.  Gastrointestinal: Negative for nausea and vomiting.  Neurological: Positive for headaches. Negative for dizziness, weakness and light-headedness.    Past Medical History:  Diagnosis Date  . Allergy   . Arthritis   . 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 Cancer ([place])   . Diabetes mellitus without complication ([place])   . GERD (gastroesophageal reflux disease)    occ  . Gout   . History of hiatal hernia   . HOH (hard of hearing)   . Hypertension   . Hypothyroidism   . Pneumonia    hx  . Thyroid disease   . Wears glasses    Past Surgical History:  Procedure Laterality Date  . BREAST BIOPSY Right    remote, benign  . BREAST LUMPECTOMY WITH NEEDLE LOCALIZATION Right 12/16/2013   Procedure: BREAST LUMPECTOMY WITH NEEDLE LOCALIZATION;  Surgeon: Edward Jolly, MD;  Location: [facility];  Service: General;  Laterality: Right;  . CHOLECYSTECTOMY  1970  . COLONOSCOPY    . FINGER SURGERY     right hand-  . gallbladder sugery    . RE-EXCISION OF BREAST LUMPECTOMY Right 01/30/2014   Procedure: RE-EXCISION OF BREAST LUMPECTOMY;  Surgeon: Edward Jolly, MD;  Location: [facility];  Service: General;  Laterality:  Right;  . RE-EXCISION OF BREAST LUMPECTOMY Right 03/06/2014   Procedure: RE-EXCISION OF BREAST LUMPECTOMY;  Surgeon: Benjamin T Hoxworth, MD;  Location: [facility];  Service: General;  Laterality: Right;  . SHOULDER ARTHROSCOPY  2011   left  . THYROID SURGERY  1970  . TOTAL SHOULDER ARTHROPLASTY Right 05/21/2016  . TOTAL SHOULDER ARTHROPLASTY Right 05/21/2016   Procedure: RIGHT TOTAL SHOULDER ARTHROPLASTY;  Surgeon: Daniel F Murphy, MD;   Location[facility];  Service: Orthopedics;  Laterality: Right;  . TUBAL LIGATION     Social History:   reports that she has never smoked. She has never used smokeless tobacco. She reports that she does not drink alcohol or use drugs.  Family History  Problem Relation Age of Onset  . Diabetes Mother   . Arthritis Father   . Seizures Sister   . Cancer Sister     breast  . Multiple sclerosis Son     Deceased     Medications: Patient's Medications  New Prescriptions   No medications on file  Previous Medications   ALLOPURINOL (ZYLOPRIM) 100 MG TABLET    take 1 tablet by mouth once daily for GOUT PREVENTION   AMLODIPINE (NORVASC) 5 MG TABLET    take 1 tablet by mouth once daily   AMOXICILLIN-CLAVULANATE (AUGMENTIN) 875-125 MG TABLET    Take 1 tablet by mouth 2 (two) times daily.   FEEDING SUPPLEMENT, GLUCERNA SHAKE, (GLUCERNA SHAKE) LIQD    Take 237 mLs by mouth 2 (two) times daily between meals.   FERROUS SULFATE 325 (65 FE) MG TABLET    Take 1 tablet (325 mg total) by mouth daily with breakfast.   GLIPIZIDE (GLUCOTROL XL) 5 MG 24 HR TABLET    take 1 tablet by mouth once daily WITH BREAKFAST   HYDROMORPHONE (DILAUDID) 2 MG TABLET    Take 1-2 tablets (2-4 mg total) by mouth every 4 (four) hours as needed for moderate pain or severe pain.   LEVOTHYROXINE (SYNTHROID, LEVOTHROID) 100 MCG TABLET    TAKE 1 TABLET BY MOUTH ONCE DAILY BEFORE BREAKFAST   ONDANSETRON (ZOFRAN) 4 MG TABLET    Take 1 tablet (4 mg total) by mouth every 8 (eight) hours as needed for nausea or vomiting.   POLYETHYLENE GLYCOL (MIRALAX / GLYCOLAX) PACKET    Take 17 g by mouth daily as needed for mild constipation.   SENNA (SENOKOT) 8.6 MG TABS TABLET    Take 1 tablet (8.6 mg total) by mouth 2 (two) times daily.   TAMOXIFEN (NOLVADEX) 20 MG TABLET    Take 1 tablet (20 mg total) by mouth daily.   TRAMADOL (ULTRAM) 50 MG TABLET    take 1 tablet by mouth three times a day with meals if needed for MODERATE TO SEVERE PAIN    Modified Medications   No medications on file  Discontinued Medications   LISINOPRIL-HYDROCHLOROTHIAZIDE (PRINZIDE,ZESTORETIC) 20-12.5 MG TABLET    Take 1 tablet by mouth daily.    TORSEMIDE (DEMADEX) 20 MG TABLET    Take 1 tablet (20 mg total) by mouth daily.     Physical Exam:  Vitals:   10/06/16 0842  BP: 126/82  Pulse: 97  Resp: 17  Temp: 97.8 F (36.6 C)  TempSrc: Oral  SpO2: 98%  Weight: 226 lb 12.8 oz (102.9 kg)  Height: 5' 2" (1.575 m)   Body mass index is 41.48 kg/m.  Physical Exam  Constitutional: She is oriented to person, place, and time. She appears well-developed and   well-nourished.  HENT:  Head: Normocephalic.  Nose: Nose normal.  Mouth/Throat: Oropharynx is clear and moist.  Neck: Normal range of motion.  Cardiovascular: Normal rate, regular rhythm and normal heart sounds.   Pulmonary/Chest: Effort normal and breath sounds normal.  Abdominal: Soft. Bowel sounds are normal.  Musculoskeletal: She exhibits no edema.  Lymphadenopathy:    She has no cervical adenopathy.  Neurological: She is alert and oriented to person, place, and time.  Skin: Skin is warm and dry.  Psychiatric: She has a normal mood and affect. Her behavior is normal. Judgment and thought content normal.    Labs reviewed: Basic Metabolic Panel:  Recent Labs  01/11/16 1108 04/18/16 1201  05/25/16 0645  06/06/16 0814 07/25/16 1454 09/30/16 1629  NA 146* 142  < > 130*  < > 140 141 139  K 5.0 3.9  < > 3.7  < > 4.2 4.3 4.4  CL 108* 103  < > 100*  < > 106 107 101  CO2 17* 23  < > 22  < > 24 24 24  GLUCOSE 98 92  < > 102*  < > 101* 87 134*  BUN 19 28*  < > 32*  < > 19 24 79*  CREATININE 1.16* 1.30*  < > 2.47*  < > 1.08* 1.13* 2.54*  CALCIUM 9.5 9.5  < > 8.5*  < > 9.4 9.4 9.6  MG  --   --   --  1.7  --   --   --   --   PHOS  --   --   --  3.2  --   --   --   --   TSH 2.470 0.574  --  1.442  --   --   --   --   < > = values in this interval not displayed. Liver Function  Tests:  Recent Labs  05/25/16 0645 05/27/16 0514 07/25/16 1454  AST 35 33 11  ALT 16 23 7  ALKPHOS 51 50 53  BILITOT 0.8 0.4 0.4  PROT 5.3* 5.2* 6.7  ALBUMIN 2.4* 2.3* 3.7   No results for input(s): LIPASE, AMYLASE in the last 8760 hours. No results for input(s): AMMONIA in the last 8760 hours. CBC:  Recent Labs  04/18/16 1201  05/24/16 2306  05/26/16 0357 05/27/16 0514 09/30/16 1629  WBC 5.7  < > 11.9*  < > 6.0 5.8 7.3  NEUTROABS 4.1  --  9.0*  --   --   --  5,183  HGB  --   < > 9.1*  < > 8.2* 8.4* 11.7  HCT 36.0  < > 26.0*  < > 24.2* 24.9* 37.0  MCV 89  < > 83.1  < > 82.6 83.8 85.5  PLT 185  < > 201  < > 248 279 226  < > = values in this interval not displayed. Lipid Panel:  Recent Labs  01/11/16 1108 04/18/16 1201  CHOL 180 170  HDL 60 60  LDLCALC 103* 90  TRIG 86 98  CHOLHDL 3.0 2.8   TSH:  Recent Labs  01/11/16 1108 04/18/16 1201 05/25/16 0645  TSH 2.470 0.574 1.442   A1C: Lab Results  Component Value Date   HGBA1C 5.7 (H) 05/25/2016     Assessment/Plan 1. Acute renal insufficiency - The patient has been able to increase her fluid intake over the past 5 days.  - Denies dizziness and weakness - BMP with eGFR today to re-evaluate kidney function. -   Encouraged to keep drinking plenty of water to stay hydrated.  2. Acute URI - Improving, does continue to have nagging headache, productive cough with clear sputum, and a slight running nose.  - Continues on the antibiotics until 7 day course completed. - No N/V/D reported - May take Tylenol 650 mg Q6H PRN for headache with a maximum of 3,000 mg in 24 hours  3. Bilateral edema of lower extremity - No increase in lower extremity edema since holding Demedex and Lisinopril-HCTZ  4. Essential hypertension, benign - Controlled without the use of Demedex and Lisinopril-HCTZ; BP 126/82 today - Continue Norvasc as prescribed - BMP with eGFR   Keep routine follow up with Dr Carter and Follow-up  sooner if needed  Jessica K. Eubanks, AGNP  [facility] [phone](Monday-Friday 8 am - 5 pm) [phone] (after hours)  

## 2016-10-07 ENCOUNTER — Other Ambulatory Visit: Payer: Self-pay

## 2016-10-07 DIAGNOSIS — I10 Essential (primary) hypertension: Secondary | ICD-10-CM

## 2016-10-07 DIAGNOSIS — N289 Disorder of kidney and ureter, unspecified: Secondary | ICD-10-CM

## 2016-10-07 MED ORDER — LISINOPRIL 20 MG PO TABS: 20.0000 mg | ORAL_TABLET | Freq: Every day | ORAL | 3 refills | Status: DC

## 2016-10-09 ENCOUNTER — Telehealth: Payer: Self-pay

## 2016-10-09 NOTE — Telephone Encounter (Signed)
I called patient to see how she was feeling and also to see if she had finished antibiotics. Patient stated that she is still having a dry cough but is feeling better. Patient stated that she did complete the full course of antibiotic. She stated that she is not have any chest congestion with the cough. Patient did not have any other concerns at this time.

## 2016-10-17 ENCOUNTER — Other Ambulatory Visit: Payer: Self-pay | Admitting: Internal Medicine

## 2016-10-24 ENCOUNTER — Ambulatory Visit: Payer: Medicare Other | Admitting: Internal Medicine

## 2016-10-24 DIAGNOSIS — M19011 Primary osteoarthritis, right shoulder: Secondary | ICD-10-CM | POA: Diagnosis not present

## 2016-10-29 ENCOUNTER — Other Ambulatory Visit: Payer: Medicare Other

## 2016-10-29 DIAGNOSIS — I10 Essential (primary) hypertension: Secondary | ICD-10-CM | POA: Diagnosis not present

## 2016-10-29 DIAGNOSIS — N289 Disorder of kidney and ureter, unspecified: Secondary | ICD-10-CM | POA: Diagnosis not present

## 2016-10-29 LAB — BASIC METABOLIC PANEL WITH GFR
BUN: 14 mg/dL (ref 7–25)
CO2: 20 mmol/L (ref 20–31)
Calcium: 9.2 mg/dL (ref 8.6–10.4)
Chloride: 109 mmol/L (ref 98–110)
Creat: 0.93 mg/dL (ref 0.60–0.93)
GFR, Est African American: 68 mL/min (ref >=60)
GFR, Est Non African American: 59 mL/min — ABNORMAL LOW (ref >=60)
Glucose, Bld: 83 mg/dL (ref 65–99)
Potassium: 4.3 mmol/L (ref 3.5–5.3)
Sodium: 139 mmol/L (ref 135–146)

## 2016-10-31 ENCOUNTER — Other Ambulatory Visit: Payer: Self-pay | Admitting: Internal Medicine

## 2016-10-31 DIAGNOSIS — Z853 Personal history of malignant neoplasm of breast: Secondary | ICD-10-CM

## 2016-11-01 ENCOUNTER — Other Ambulatory Visit: Payer: Self-pay | Admitting: Nurse Practitioner

## 2016-11-05 ENCOUNTER — Ambulatory Visit (INDEPENDENT_AMBULATORY_CARE_PROVIDER_SITE_OTHER): Payer: Medicare Other | Admitting: Internal Medicine

## 2016-11-05 ENCOUNTER — Ambulatory Visit: Payer: Medicare Other | Admitting: Internal Medicine

## 2016-11-05 ENCOUNTER — Encounter: Payer: Self-pay | Admitting: Internal Medicine

## 2016-11-05 VITALS — BP 118/74 | HR 84 | Temp 98.0°F | Ht 62.0 in | Wt 234.8 lb

## 2016-11-05 DIAGNOSIS — E1122 Type 2 diabetes mellitus with diabetic chronic kidney disease: Secondary | ICD-10-CM | POA: Diagnosis not present

## 2016-11-05 DIAGNOSIS — R252 Cramp and spasm: Secondary | ICD-10-CM | POA: Diagnosis not present

## 2016-11-05 DIAGNOSIS — I10 Essential (primary) hypertension: Secondary | ICD-10-CM

## 2016-11-05 DIAGNOSIS — E034 Atrophy of thyroid (acquired): Secondary | ICD-10-CM | POA: Diagnosis not present

## 2016-11-05 DIAGNOSIS — M150 Primary generalized (osteo)arthritis: Secondary | ICD-10-CM

## 2016-11-05 DIAGNOSIS — M159 Polyosteoarthritis, unspecified: Secondary | ICD-10-CM

## 2016-11-05 DIAGNOSIS — N182 Chronic kidney disease, stage 2 (mild): Secondary | ICD-10-CM | POA: Diagnosis not present

## 2016-11-05 DIAGNOSIS — E440 Moderate protein-calorie malnutrition: Secondary | ICD-10-CM

## 2016-11-05 DIAGNOSIS — M8949 Other hypertrophic osteoarthropathy, multiple sites: Secondary | ICD-10-CM

## 2016-11-05 MED ORDER — TRAMADOL HCL 50 MG PO TABS: ORAL_TABLET | ORAL | 1 refills | Status: DC

## 2016-11-05 MED ORDER — FERROUS SULFATE 325 (65 FE) MG PO TABS: 325.0000 mg | ORAL_TABLET | Freq: Every day | ORAL | 3 refills | Status: DC

## 2016-11-05 NOTE — Progress Notes (Signed)
Patient ID: Karla Willis, female   DOB: 1935-05-02, 81 y.o.   MRN: [id number]    Location:  [facility] Place of Service: OFFICE  Chief Complaint  Patient presents with  . Medical Management of Chronic Issues    HPI:  81 yo female seen today for f/u. She has night time pain in her leg and foot on left that gets better with getting up and walking around. Tramadol helps.  DM - BS at home stable. No low BS reactions. She takes glipizide xl. She has some swelling in L>R foot. She takes actos. Occasional  numbness or tingling. Last A1c 5.7%. LDL 90  Leg cramps - she had to stop gabapentin due to next day dizziness. She is taking tramadol prn which helps  HTN - stable on amlodipine, lisinopril HCT  Thyroid - stable on levothyroxine. TSH 1.44  Arthritis - stable on tramadol. Takes allopurinol for gout. No recent gout attacks.   Hx breast CA - followed by oncology. Last mammogram in Nov 2016 was benign. She takes tamoxifen and has occasional night sweats and hot flashes. No signs of recurrence  Weight loss - albumin 3.7. She gets glucerna. Appetite improved since family moved into larger home. Weight Is stable    Past Medical History:  Diagnosis Date  . Allergy   . Arthritis   . 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 Cancer ([place])   . Diabetes mellitus without complication ([place])   . GERD (gastroesophageal reflux disease)    occ  . Gout   . History of hiatal hernia   . HOH (hard of hearing)   . Hypertension   . Hypothyroidism   . Pneumonia    hx  . Thyroid disease   . Wears glasses     Past Surgical History:  Procedure Laterality Date  . BREAST BIOPSY Right    remote, benign  . BREAST LUMPECTOMY WITH NEEDLE LOCALIZATION Right 12/16/2013   Procedure: BREAST LUMPECTOMY WITH NEEDLE LOCALIZATION;  Surgeon: Edward Jolly, MD;  Location: [facility];  Service: General;  Laterality: Right;  . CHOLECYSTECTOMY  1970  . COLONOSCOPY    . FINGER  SURGERY     right hand-  . gallbladder sugery    . RE-EXCISION OF BREAST LUMPECTOMY Right 01/30/2014   Procedure: RE-EXCISION OF BREAST LUMPECTOMY;  Surgeon: Edward Jolly, MD;  Location: [facility];  Service: General;  Laterality: Right;  . RE-EXCISION OF BREAST LUMPECTOMY Right 03/06/2014   Procedure: RE-EXCISION OF BREAST LUMPECTOMY;  Surgeon: Edward Jolly, MD;  Location: [facility];  Service: General;  Laterality: Right;  . SHOULDER ARTHROSCOPY  2011   left  . THYROID SURGERY  1970  . TOTAL SHOULDER ARTHROPLASTY Right 05/21/2016  . TOTAL SHOULDER ARTHROPLASTY Right 05/21/2016   Procedure: RIGHT TOTAL SHOULDER ARTHROPLASTY;  Surgeon: Ninetta Lights, MD;  Location: [facility];  Service: Orthopedics;  Laterality: Right;  . TUBAL LIGATION      Patient Care Team: Gildardo Cranker, DO as PCP - General (Internal Medicine) Nicholas Lose, MD as Consulting Physician (Hematology and Oncology)  Social History   Social History  . Marital status: Widowed    Spouse name: N/A  . Number of children: N/A  . Years of education: N/A   Occupational History  . Not on file.   Social History Main Topics  . Smoking status: Never Smoker  . Smokeless tobacco: Never Used  . Alcohol use No  .  Drug use: No  . Sexual activity: Not Currently     Comment: menarche age 21, G58, P33, first live birth age 83, menopause at 68, no HRT   Other Topics Concern  . Not on file   Social History Narrative   Diet: No      Do you drink/ eat things with caffeine? Yes      Marital status:    Widowed                           What year were you married ?  1956      Do you live in a house, apartment,assistred living, condo, trailer, etc.)?  apartment      Is it one or more stories?  one      How many persons live in your home ?  3      Do you have any pets in your home ?(please list)  No      Current or past profession:  Bank, Caregiver      Do you exercise?  No                             Type & how often:        Do you have a living will?  Yes      Do you have a DNR form?  Yes                     If not, do you want to discuss one?       Do you have signed POA?HPOA forms?                 If so, please bring to your        appointment           reports that she has never smoked. She has never used smokeless tobacco. She reports that she does not drink alcohol or use drugs.  Family History  Problem Relation Age of Onset  . Diabetes Mother   . Arthritis Father   . Seizures Sister   . Cancer Sister     breast  . Multiple sclerosis Son     Deceased    Family Status  Relation Status  . Mother Deceased at age 79  . Father Deceased  . Sister Deceased   59years old  . Sister Alive   26years old  . Son Alive   68 years old  . Daughter Alive   79 years old  . Son Alive   68 years old  . Daughter Alive   10 years old  . Sister   . Son      Allergies  Allergen Reactions  . Actos [Pioglitazone] Swelling  . Hydrocodone Nausea Only and Other (See Comments)    "bloated"  . Codeine Rash  . Neurontin [Gabapentin] Palpitations and Other (See Comments)    dizziness  . Other Rash    Nuts--PECANS  . Peanut-Containing Drug Products Rash    Medications: Patient's Medications  New Prescriptions   No medications on file  Previous Medications   ALLOPURINOL (ZYLOPRIM) 100 MG TABLET    take 1 tablet by mouth once daily for GOUT PREVENTION   AMLODIPINE (NORVASC) 5 MG TABLET    take 1 tablet by mouth once daily   FEEDING SUPPLEMENT, GLUCERNA SHAKE, (GLUCERNA SHAKE) LIQD  Take 237 mLs by mouth 2 (two) times daily between meals.   GLIPIZIDE (GLUCOTROL XL) 5 MG 24 HR TABLET    take 1 tablet by mouth once daily WITH BREAKFAST   HYDROMORPHONE (DILAUDID) 2 MG TABLET    Take 1-2 tablets (2-4 mg total) by mouth every 4 (four) hours as needed for moderate pain or severe pain.   LEVOTHYROXINE (SYNTHROID, LEVOTHROID) 100 MCG TABLET    take 1 tablet by mouth  once daily BEFORE BREAKFAST   LISINOPRIL (PRINIVIL,ZESTRIL) 20 MG TABLET    Take 1 tablet (20 mg total) by mouth daily.   ONDANSETRON (ZOFRAN) 4 MG TABLET    Take 1 tablet (4 mg total) by mouth every 8 (eight) hours as needed for nausea or vomiting.   POLYETHYLENE GLYCOL (MIRALAX / GLYCOLAX) PACKET    Take 17 g by mouth daily as needed for mild constipation.   SENNA (SENOKOT) 8.6 MG TABS TABLET    Take 1 tablet (8.6 mg total) by mouth 2 (two) times daily.   TAMOXIFEN (NOLVADEX) 20 MG TABLET    Take 1 tablet (20 mg total) by mouth daily.  Modified Medications   Modified Medication Previous Medication   FERROUS SULFATE 325 (65 FE) MG TABLET ferrous sulfate 325 (65 FE) MG tablet      Take 1 tablet (325 mg total) by mouth daily with breakfast.    Take 1 tablet (325 mg total) by mouth daily with breakfast.   TRAMADOL (ULTRAM) 50 MG TABLET traMADol (ULTRAM) 50 MG tablet      take 1 tablet by mouth three times a day with meals if needed for MODERATE TO SEVERE PAIN    take 1 tablet by mouth three times a day with meals if needed for MODERATE TO SEVERE PAIN  Discontinued Medications   No medications on file    Review of Systems  HENT: Positive for hearing loss.   Musculoskeletal: Positive for arthralgias, gait problem and joint swelling.  Neurological: Positive for weakness and numbness.  All other systems reviewed and are negative.   Vitals:   11/05/16 1144  BP: 118/74  Pulse: 84  Temp: 98 F (36.7 C)  TempSrc: Oral  SpO2: 98%  Weight: 234 lb 12.8 oz (106.5 kg)  Height: '5\' 2"'  (1.575 m)   Body mass index is 42.95 kg/m.  Physical Exam  Constitutional: She is oriented to person, place, and time. She appears well-developed and well-nourished. No distress.  HENT:  Mouth/Throat: Oropharynx is clear and moist. No oropharyngeal exudate.  Eyes: Pupils are equal, round, and reactive to light. No scleral icterus.  Neck: Neck supple. Carotid bruit is not present. No tracheal deviation present.  Thyromegaly (mild) present.  Cardiovascular: Normal rate, regular rhythm and intact distal pulses.  Exam reveals no gallop and no friction rub.   Murmur (2/6 SEM) heard. +2 pitting LE edema. No calf TTP  Pulmonary/Chest: Effort normal and breath sounds normal. No stridor. No respiratory distress. She has no wheezes. She has no rales.  Abdominal: Soft. Bowel sounds are normal. She exhibits no distension and no mass. There is no hepatomegaly. There is no tenderness. There is no rebound and no guarding.  Musculoskeletal: She exhibits edema and tenderness.  Right shoulder reduced ROM and swelling with TTP  Lymphadenopathy:    She has no cervical adenopathy.  Neurological: She is alert and oriented to person, place, and time.  Skin: Skin is warm and dry. Rash (eczematous rash on right supraclavicular area. no vesicular formation) noted.  Egg sized freely mobile lipoma at sternoclavicular notch; right shoulder incisional scar with tiny area of eczema. No d/c  Psychiatric: She has a normal mood and affect. Her behavior is normal. Judgment and thought content normal.   Diabetic Foot Exam - Simple   Simple Foot Form Diabetic Foot exam was performed with the following findings:  Yes 11/05/2016 12:28 PM  Visual Inspection See comments:  Yes Sensation Testing Intact to touch and monofilament testing bilaterally:  Yes Pulse Check Posterior Tibialis and Dorsalis pulse intact bilaterally:  Yes Comments B/l bunion with no calluses or ulcerations       Labs reviewed: Appointment on 10/29/2016  Component Date Value Ref Range Status  . Sodium 10/29/2016 139  135 - 146 mmol/L Final  . Potassium 10/29/2016 4.3  3.5 - 5.3 mmol/L Final  . Chloride 10/29/2016 109  98 - 110 mmol/L Final  . CO2 10/29/2016 20  20 - 31 mmol/L Final  . Glucose, Bld 10/29/2016 83  65 - 99 mg/dL Final  . BUN 10/29/2016 14  7 - 25 mg/dL Final  . Creat 10/29/2016 0.93  0.60 - 0.93 mg/dL Final   Comment:   For patients  > or = 79 years of age: The upper reference limit for Creatinine is approximately 13% higher for people identified as African-American.     . Calcium 10/29/2016 9.2  8.6 - 10.4 mg/dL Final  . GFR, Est African American 10/29/2016 68  >=60 mL/min Final  . GFR, Est Non African American 10/29/2016 59* >=60 mL/min Final  Office Visit on 10/06/2016  Component Date Value Ref Range Status  . Sodium 10/06/2016 141  135 - 146 mmol/L Final  . Potassium 10/06/2016 4.4  3.5 - 5.3 mmol/L Final  . Chloride 10/06/2016 107  98 - 110 mmol/L Final  . CO2 10/06/2016 28  20 - 31 mmol/L Final  . Glucose, Bld 10/06/2016 94  65 - 99 mg/dL Final  . BUN 10/06/2016 44* 7 - 25 mg/dL Final  . Creat 10/06/2016 1.31* 0.60 - 0.93 mg/dL Final   Comment:   For patients > or = 79 years of age: The upper reference limit for Creatinine is approximately 13% higher for people identified as African-American.     . Calcium 10/06/2016 9.5  8.6 - 10.4 mg/dL Final  . GFR, Est African American 10/06/2016 45* >=60 mL/min Final  . GFR, Est Non African American 10/06/2016 39* >=60 mL/min Final  Office Visit on 09/30/2016  Component Date Value Ref Range Status  . Sodium 09/30/2016 139  135 - 146 mmol/L Final  . Potassium 09/30/2016 4.4  3.5 - 5.3 mmol/L Final  . Chloride 09/30/2016 101  98 - 110 mmol/L Final  . CO2 09/30/2016 24  20 - 31 mmol/L Final  . Glucose, Bld 09/30/2016 134* 65 - 99 mg/dL Final  . BUN 09/30/2016 79* 7 - 25 mg/dL Final  . Creat 09/30/2016 2.54* 0.60 - 0.93 mg/dL Final   Comment:   For patients > or = 79 years of age: The upper reference limit for Creatinine is approximately 13% higher for people identified as African-American.     . Calcium 09/30/2016 9.6  8.6 - 10.4 mg/dL Final  . GFR, Est African American 09/30/2016 20* >=60 mL/min Final  . GFR, Est Non African American 09/30/2016 18* >=60 mL/min Final  . WBC 09/30/2016 7.3  3.8 - 10.8 K/uL Final  . RBC 09/30/2016 4.33  3.80 - 5.10 MIL/uL Final    . Hemoglobin 09/30/2016 11.7  11.7 - 15.5 g/dL Final  . HCT 09/30/2016 37.0  35.0 - 45.0 % Final  . MCV 09/30/2016 85.5  80.0 - 100.0 fL Final  . MCH 09/30/2016 27.0  27.0 - 33.0 pg Final  . MCHC 09/30/2016 31.6* 32.0 - 36.0 g/dL Final  . RDW 09/30/2016 17.7* 11.0 - 15.0 % Final  . Platelets 09/30/2016 226  140 - 400 K/uL Final  . MPV 09/30/2016 10.8  7.5 - 12.5 fL Final  . Neutro Abs 09/30/2016 5183  1,500 - 7,800 cells/uL Final  . Lymphs Abs 09/30/2016 1314  850 - 3,900 cells/uL Final  . Monocytes Absolute 09/30/2016 657  200 - 950 cells/uL Final  . Eosinophils Absolute 09/30/2016 146  15 - 500 cells/uL Final  . Basophils Absolute 09/30/2016 0  0 - 200 cells/uL Final  . Neutrophils Relative % 09/30/2016 71  % Final  . Lymphocytes Relative 09/30/2016 18  % Final  . Monocytes Relative 09/30/2016 9  % Final  . Eosinophils Relative 09/30/2016 2  % Final  . Basophils Relative 09/30/2016 0  % Final  . Smear Review 09/30/2016 Criteria for review not met   Final    No results found.   Assessment/Plan   ICD-9-CM ICD-10-CM   1. Leg cramps - stable on tramadol 729.82 R25.2   2. Primary osteoarthritis involving multiple joints 715.09 M15.0   3. Type 2 diabetes mellitus with stage 2 chronic kidney disease, without long-term current use of insulin (HCC) 250.40 E11.22    585.2 N18.2   4. Hypothyroidism due to acquired atrophy of thyroid 244.8 E03.4    246.8    5. Essential hypertension, benign 401.1 I10   6. Moderate protein-calorie malnutrition ([place]) 263.0 E44.0    Continue current medications as ordered. New Rx for tramadol given today  Follow up with specialists as scheduled   Follow up in 3 mos for routine OV. Fasting labs prior to appt (cmp, lipid panel, a1c, tsh, urine microalbumin/creatinine ratio, free t4 and ua)   Adelynn Gipe S. Perlie Gold  [facility] [address] [place], [place] [address] [phone] Cell (Monday-Friday 8  AM - 5 PM) [phone] After 5 PM and follow prompts

## 2016-11-05 NOTE — Patient Instructions (Addendum)
Continue current medications as ordered  Follow up with specialists as scheduled   Follow up in 3 mos for routine OV. Fasting labs prior to appt

## 2016-11-12 ENCOUNTER — Ambulatory Visit
Admission: RE | Admit: 2016-11-12 | Discharge: 2016-11-12 | Disposition: A | Discharge status: Home / Self Care | Payer: Medicare Other | Source: Ambulatory Visit | Attending: Internal Medicine | Admitting: Internal Medicine

## 2016-11-12 DIAGNOSIS — R928 Other abnormal and inconclusive findings on diagnostic imaging of breast: Secondary | ICD-10-CM | POA: Diagnosis not present

## 2016-11-12 DIAGNOSIS — Z853 Personal history of malignant neoplasm of breast: Secondary | ICD-10-CM

## 2016-11-14 DIAGNOSIS — M25562 Pain in left knee: Secondary | ICD-10-CM | POA: Diagnosis not present

## 2016-12-09 DIAGNOSIS — M545 Low back pain: Secondary | ICD-10-CM | POA: Diagnosis not present

## 2016-12-14 ENCOUNTER — Other Ambulatory Visit: Payer: Self-pay | Admitting: Internal Medicine

## 2016-12-18 DIAGNOSIS — M545 Low back pain: Secondary | ICD-10-CM | POA: Diagnosis not present

## 2016-12-24 ENCOUNTER — Other Ambulatory Visit: Payer: Self-pay

## 2016-12-24 DIAGNOSIS — E785 Hyperlipidemia, unspecified: Secondary | ICD-10-CM

## 2016-12-24 DIAGNOSIS — I10 Essential (primary) hypertension: Secondary | ICD-10-CM

## 2016-12-24 DIAGNOSIS — N182 Chronic kidney disease, stage 2 (mild): Secondary | ICD-10-CM

## 2016-12-24 DIAGNOSIS — E1122 Type 2 diabetes mellitus with diabetic chronic kidney disease: Secondary | ICD-10-CM

## 2016-12-24 DIAGNOSIS — E034 Atrophy of thyroid (acquired): Secondary | ICD-10-CM

## 2016-12-29 DIAGNOSIS — M5416 Radiculopathy, lumbar region: Secondary | ICD-10-CM | POA: Diagnosis not present

## 2016-12-29 DIAGNOSIS — M545 Low back pain: Secondary | ICD-10-CM | POA: Diagnosis not present

## 2017-01-02 DIAGNOSIS — M545 Low back pain: Secondary | ICD-10-CM | POA: Diagnosis not present

## 2017-01-02 DIAGNOSIS — M5416 Radiculopathy, lumbar region: Secondary | ICD-10-CM | POA: Diagnosis not present

## 2017-01-03 ENCOUNTER — Other Ambulatory Visit: Payer: Self-pay | Admitting: Internal Medicine

## 2017-01-12 ENCOUNTER — Other Ambulatory Visit: Payer: Medicare Other

## 2017-01-12 DIAGNOSIS — E785 Hyperlipidemia, unspecified: Secondary | ICD-10-CM

## 2017-01-12 DIAGNOSIS — N182 Chronic kidney disease, stage 2 (mild): Secondary | ICD-10-CM

## 2017-01-12 DIAGNOSIS — E034 Atrophy of thyroid (acquired): Secondary | ICD-10-CM | POA: Diagnosis not present

## 2017-01-12 DIAGNOSIS — E1122 Type 2 diabetes mellitus with diabetic chronic kidney disease: Secondary | ICD-10-CM

## 2017-01-12 LAB — COMPLETE METABOLIC PANEL WITH GFR
ALT: 7 U/L (ref 6–29)
AST: 12 U/L (ref 10–35)
Albumin: 3.3 g/dL — ABNORMAL LOW (ref 3.6–5.1)
Alkaline Phosphatase: 64 U/L (ref 33–130)
BUN: 16 mg/dL (ref 7–25)
CO2: 24 mmol/L (ref 20–31)
Calcium: 8.8 mg/dL (ref 8.6–10.4)
Chloride: 107 mmol/L (ref 98–110)
Creat: 0.86 mg/dL (ref 0.60–0.93)
GFR, Est African American: 74 mL/min (ref >=60)
GFR, Est Non African American: 64 mL/min (ref >=60)
Glucose, Bld: 78 mg/dL (ref 65–99)
Potassium: 4.1 mmol/L (ref 3.5–5.3)
Sodium: 140 mmol/L (ref 135–146)
Total Bilirubin: 0.3 mg/dL (ref 0.2–1.2)
Total Protein: 5.6 g/dL — ABNORMAL LOW (ref 6.1–8.1)

## 2017-01-12 LAB — LIPID PANEL
Cholesterol: 158 mg/dL (ref <200)
HDL: 59 mg/dL (ref >50)
LDL Cholesterol: 83 mg/dL (ref <100)
Total CHOL/HDL Ratio: 2.7 Ratio (ref <5.0)
Triglycerides: 80 mg/dL (ref <150)
VLDL: 16 mg/dL (ref <30)

## 2017-01-12 LAB — TSH: TSH: 0.67 mIU/L

## 2017-01-12 LAB — HEMOGLOBIN A1C
Hgb A1c MFr Bld: 5.7 % — ABNORMAL HIGH (ref <5.7)
Mean Plasma Glucose: 117 mg/dL

## 2017-01-12 LAB — T4, FREE: Free T4: 1.1 ng/dL (ref 0.8–1.8)

## 2017-01-13 LAB — URINALYSIS, MICROSCOPIC ONLY
Casts: NONE SEEN [LPF]
Crystals: NONE SEEN [HPF]
Yeast: NONE SEEN [HPF]

## 2017-01-13 LAB — MICROALBUMIN / CREATININE URINE RATIO
Creatinine, Urine: 66 mg/dL (ref 20–320)
Microalb Creat Ratio: 3 mcg/mg creat (ref <30)
Microalb, Ur: 0.2 mg/dL

## 2017-01-13 LAB — URINALYSIS, ROUTINE W REFLEX MICROSCOPIC
Bilirubin Urine: NEGATIVE
Glucose, UA: NEGATIVE
Hgb urine dipstick: NEGATIVE
Ketones, ur: NEGATIVE
Nitrite: NEGATIVE
Protein, ur: NEGATIVE
Specific Gravity, Urine: 1.009 (ref 1.001–1.035)
pH: 6 (ref 5.0–8.0)

## 2017-01-14 ENCOUNTER — Encounter: Payer: Self-pay | Admitting: Internal Medicine

## 2017-01-14 ENCOUNTER — Ambulatory Visit (INDEPENDENT_AMBULATORY_CARE_PROVIDER_SITE_OTHER): Payer: Medicare Other | Admitting: Internal Medicine

## 2017-01-14 ENCOUNTER — Ambulatory Visit (INDEPENDENT_AMBULATORY_CARE_PROVIDER_SITE_OTHER): Payer: Medicare Other

## 2017-01-14 VITALS — BP 136/70 | HR 84 | Temp 98.4°F | Ht 62.0 in | Wt 236.0 lb

## 2017-01-14 DIAGNOSIS — M8949 Other hypertrophic osteoarthropathy, multiple sites: Secondary | ICD-10-CM

## 2017-01-14 DIAGNOSIS — E1122 Type 2 diabetes mellitus with diabetic chronic kidney disease: Secondary | ICD-10-CM

## 2017-01-14 DIAGNOSIS — M150 Primary generalized (osteo)arthritis: Secondary | ICD-10-CM

## 2017-01-14 DIAGNOSIS — E034 Atrophy of thyroid (acquired): Secondary | ICD-10-CM

## 2017-01-14 DIAGNOSIS — Z23 Encounter for immunization: Secondary | ICD-10-CM

## 2017-01-14 DIAGNOSIS — Z0000 Encounter for general adult medical examination without abnormal findings: Secondary | ICD-10-CM | POA: Diagnosis not present

## 2017-01-14 DIAGNOSIS — N182 Chronic kidney disease, stage 2 (mild): Secondary | ICD-10-CM

## 2017-01-14 DIAGNOSIS — R252 Cramp and spasm: Secondary | ICD-10-CM

## 2017-01-14 DIAGNOSIS — Z135 Encounter for screening for eye and ear disorders: Secondary | ICD-10-CM | POA: Diagnosis not present

## 2017-01-14 DIAGNOSIS — R600 Localized edema: Secondary | ICD-10-CM | POA: Diagnosis not present

## 2017-01-14 DIAGNOSIS — M159 Polyosteoarthritis, unspecified: Secondary | ICD-10-CM

## 2017-01-14 DIAGNOSIS — I10 Essential (primary) hypertension: Secondary | ICD-10-CM | POA: Diagnosis not present

## 2017-01-14 DIAGNOSIS — E441 Mild protein-calorie malnutrition: Secondary | ICD-10-CM

## 2017-01-14 NOTE — Progress Notes (Signed)
Subjective:   Karla Willis is a 82 y.o. female who presents for an Initial Medicare Annual Wellness Visit.  Review of Systems     Cardiac Risk Factors include: advanced age (>26men, >30 women);diabetes mellitus;sedentary lifestyle;obesity (BMI >30kg/m2);hypertension     Objective:    Today's Vitals   01/14/17 1402  BP: 136/70  Pulse: 84  Temp: 98.4 F (36.9 C)  TempSrc: Oral  SpO2: 96%  Weight: 236 lb (107 kg)  Height: 5\' 2"  (1.575 m)  PainSc: 4    Body mass index is 43.16 kg/m.   Current Medications (verified) Outpatient Encounter Prescriptions as of 01/14/2017  Medication Sig  . allopurinol (ZYLOPRIM) 100 MG tablet take 1 tablet by mouth once daily FOR GOUT PREVENTION  . amLODipine (NORVASC) 5 MG tablet take 1 tablet by mouth once daily  . feeding supplement, GLUCERNA SHAKE, (GLUCERNA SHAKE) LIQD Take 237 mLs by mouth 2 (two) times daily between meals.  . ferrous sulfate 325 (65 FE) MG tablet Take 1 tablet (325 mg total) by mouth daily with breakfast.  . glipiZIDE (GLUCOTROL XL) 5 MG 24 hr tablet take 1 tablet by mouth once daily WITH BREAKFAST  . levothyroxine (SYNTHROID, LEVOTHROID) 100 MCG tablet take 1 tablet by mouth once daily BEFORE BREAKFAST  . lisinopril (PRINIVIL,ZESTRIL) 20 MG tablet Take 1 tablet (20 mg total) by mouth daily.  . tamoxifen (NOLVADEX) 20 MG tablet Take 1 tablet (20 mg total) by mouth daily.  . traMADol (ULTRAM) 50 MG tablet take 1 tablet by mouth three times a day with meals if needed for MODERATE TO SEVERE PAIN  . [DISCONTINUED] polyethylene glycol (MIRALAX / GLYCOLAX) packet Take 17 g by mouth daily as needed for mild constipation.  . [DISCONTINUED] HYDROmorphone (DILAUDID) 2 MG tablet Take 1-2 tablets (2-4 mg total) by mouth every 4 (four) hours as needed for moderate pain or severe pain.  . [DISCONTINUED] ondansetron (ZOFRAN) 4 MG tablet Take 1 tablet (4 mg total) by mouth every 8 (eight) hours as needed for nausea or vomiting.  .  [DISCONTINUED] senna (SENOKOT) 8.6 MG TABS tablet Take 1 tablet (8.6 mg total) by mouth 2 (two) times daily.   No facility-administered encounter medications on file as of 01/14/2017.     Allergies (verified) Actos [pioglitazone]; Hydrocodone; Codeine; Neurontin [gabapentin]; Other; and Peanut-containing drug products   History: Past Medical History:  Diagnosis Date  . Allergy   . Arthritis   . 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 Cancer ([place])   . Diabetes mellitus without complication ([place])   . GERD (gastroesophageal reflux disease)    occ  . Gout   . History of hiatal hernia   . HOH (hard of hearing)   . Hypertension   . Hypothyroidism   . Pneumonia    hx  . Thyroid disease   . Wears glasses    Past Surgical History:  Procedure Laterality Date  . BREAST BIOPSY Right    remote, benign  . BREAST LUMPECTOMY WITH NEEDLE LOCALIZATION Right 12/16/2013   Procedure: BREAST LUMPECTOMY WITH NEEDLE LOCALIZATION;  Surgeon: Edward Jolly, MD;  Location: [facility];  Service: General;  Laterality: Right;  . CHOLECYSTECTOMY  1970  . COLONOSCOPY    . FINGER SURGERY     right hand-  . gallbladder sugery    . RE-EXCISION OF BREAST LUMPECTOMY Right 01/30/2014   Procedure: RE-EXCISION OF BREAST LUMPECTOMY;  Surgeon: Edward Jolly, MD;  Location: [facility];  Service: General;  Laterality: Right;  . RE-EXCISION OF BREAST LUMPECTOMY Right 03/06/2014   Procedure: RE-EXCISION OF BREAST LUMPECTOMY;  Surgeon: Edward Jolly, MD;  Location: [facility];  Service: General;  Laterality: Right;  . SHOULDER ARTHROSCOPY  2011   left  . THYROID SURGERY  1970  . TOTAL SHOULDER ARTHROPLASTY Right 05/21/2016  . TOTAL SHOULDER ARTHROPLASTY Right 05/21/2016   Procedure: RIGHT TOTAL SHOULDER ARTHROPLASTY;  Surgeon: Ninetta Lights, MD;  Location: [facility];  Service: Orthopedics;  Laterality: Right;  . TUBAL LIGATION     Family  History  Problem Relation Age of Onset  . Diabetes Mother   . Arthritis Father   . Seizures Sister   . Cancer Sister     breast  . Multiple sclerosis Son     Deceased    Social History   Occupational History  . Not on file.   Social History Main Topics  . Smoking status: Never Smoker  . Smokeless tobacco: Never Used  . Alcohol use No  . Drug use: No  . Sexual activity: Not Currently     Comment: menarche age 60, G47, P66, first live birth age 89, menopause at 34, no HRT    Tobacco Counseling Counseling given: No   Activities of Daily Living In your present state of health, do you have any difficulty performing the following activities: 01/14/2017 05/25/2016  Hearing? Tempie Donning  Vision? Y N  Difficulty concentrating or making decisions? N N  Walking or climbing stairs? N Y  Dressing or bathing? N Y  Doing errands, shopping? N Y  Conservation officer, nature and eating ? N -  Using the Toilet? N -  In the past six months, have you accidently leaked urine? N -  Do you have problems with loss of bowel control? N -  Managing your Medications? N -  Managing your Finances? N -  Housekeeping or managing your Housekeeping? N -  Some recent data might be hidden    Immunizations and Health Maintenance Immunization History  Administered Date(s) Administered  . Influenza,inj,Quad PF,36+ Mos 09/07/2015, 07/25/2016  . Influenza-Unspecified 08/10/2014  . Pneumococcal Conjugate-13 01/14/2017  . Pneumococcal-Unspecified 11/10/2013  . Tdap 04/18/2016  . Zoster 04/18/2012   Health Maintenance Due  Topic Date Due  . OPHTHALMOLOGY EXAM  11/06/1945  . DEXA SCAN  11/05/2002    Patient Care Team: Gildardo Cranker, DO as PCP - General (Internal Medicine) Nicholas Lose, MD as Consulting Physician (Hematology and Oncology)  Indicate any recent Medical Services you may have received from other than [facility] providers in the past year (date may be approximate).     Assessment:   This is a routine wellness  examination for Karla Willis.  Hearing/Vision screen  Visual Acuity Screening   Right eye Left eye Both eyes  Without correction:     With correction: 20/70 20/50 20/40   Comments: Last eye exam was done 3 yrs ago. Pt failed office exam and requested to have a referral for ophthalmology.   Hearing Screening Comments: Pt has never had a hearing screen. States she has noticed a decrease in her hearing; she says she "ordered" some hearing aids for both ears but does not wear them often b/c she "hears too much". Pt declines having a referral for audiology at this time.   Dietary issues and exercise activities discussed: Current Exercise Habits: The patient does not participate in regular exercise at present  Goals    . Increase water intake  Starting 01/14/17, I will attempt to increase my water intake from 4 bottles of water to 6 bottles of water per day.       Depression Screen PHQ 2/9 Scores 01/14/2017 05/29/2016 04/18/2016 01/11/2016 03/09/2015  PHQ - 2 Score 0 0 0 0 0    Fall Risk Fall Risk  01/14/2017 11/05/2016 10/06/2016 07/25/2016 05/29/2016  Falls in the past year? No No No No No    Cognitive Function: MMSE - Mini Mental State Exam 01/14/2017 01/11/2016  Not completed: - (No Data)  Orientation to time 5 5  Orientation to Place 5 5  Registration 3 3  Attention/ Calculation 5 5  Recall 3 2  Language- name 2 objects 2 2  Language- repeat 1 1  Language- follow 3 step command 3 3  Language- read & follow direction 1 1  Write a sentence 1 1  Copy design 1 1  Total score 30 29        Screening Tests Health Maintenance  Topic Date Due  . OPHTHALMOLOGY EXAM  11/06/1945  . DEXA SCAN  11/05/2002  . PNA vac Low Risk Adult (2 of 2 - PCV13) 07/03/2017 (Originally 11/10/2014)  . HEMOGLOBIN A1C  07/15/2017  . FOOT EXAM  11/05/2017  . TETANUS/TDAP  04/18/2026  . INFLUENZA VACCINE  Completed      Plan:    I have personally reviewed and addressed the Medicare Annual Wellness  questionnaire and have noted the following in the patient's chart:  A. Medical and social history B. Use of alcohol, tobacco or illicit drugs  C. Current medications and supplements D. Functional ability and status E.  Nutritional status F.  Physical activity G. Advance directives H. List of other physicians I.  Hospitalizations, surgeries, and ER visits in previous 12 months J.  Nectar to include hearing, vision, cognitive, depression L. Referrals and appointments - none  In addition, I have reviewed and discussed with patient certain preventive protocols, quality metrics, and best practice recommendations. A written personalized care plan for preventive services as well as general preventive health recommendations were provided to patient.  See attached scanned questionnaire for additional information.   Signed,   Allyn Kenner, LPN Health Advisor   I have reviewed the health advisor's note and was available for consultation. I agree with documentation and plan.   Mateusz Neilan S. Perlie Gold  [facility] [address] [place], [place] [address] [phone] Cell (Monday-Friday 8 AM - 5 PM) [phone] After 5 PM and follow prompts

## 2017-01-14 NOTE — Progress Notes (Signed)
Quick Notes   Health Maintenance:  Pn13 given;     Abnormal Screen: MMSE-30/30 Passed Clock Test    Patient Concerns:  Pt still having left leg pain    Nurse Concerns:  None

## 2017-01-14 NOTE — Patient Instructions (Signed)
Encouraged her to exercise 30-45 minutes 4-5 times per week. Eat a well balanced diet. Avoid smoking. Limit alcohol intake. Wear seatbelt when riding in the car. Wear sun block (SPF >50) when spending extended times outside.  Continue current medications as ordered  Follow up with ortho as scheduled for leg pain  Continue diet and exercise program  Follow up in 4 mos for DM, hypothyroidism and HTN. Fasting labs prior to appt

## 2017-01-14 NOTE — Patient Instructions (Addendum)
Karla Willis , Thank you for taking time to come for your Medicare Wellness Visit. I appreciate your ongoing commitment to your health goals. Please review the following plan we discussed and let me know if I can assist you in the future.   These are the goals we discussed: Goals    . Increase water intake          Starting 01/14/17, I will attempt to increase my water intake from 4 bottles of water to 6 bottles of water per day.        This is a list of the screening recommended for you and due dates:  Health Maintenance  Topic Date Due  . Eye exam for diabetics  11/06/1979  . DEXA scan (bone density measurement)  11/06/1979  . Pneumonia vaccines (2 of 2 - PCV13) 07/03/2017*  . Hemoglobin A1C  07/15/2017  . Complete foot exam   11/06/1979  . Tetanus Vaccine  04/18/2026  . Flu Shot  Completed  *Topic was postponed. The date shown is not the original due date.  Preventive Care for Adults  A healthy lifestyle and preventive care can promote health and wellness. Preventive health guidelines for adults include the following key practices.  . A routine yearly physical is a good way to check with your health care provider about your health and preventive screening. It is a chance to share any concerns and updates on your health and to receive a thorough exam.  . Visit your dentist for a routine exam and preventive care every 6 months. Brush your teeth twice a day and floss once a day. Good oral hygiene prevents tooth decay and gum disease.  . The frequency of eye exams is based on your age, health, family medical history, use  of contact lenses, and other factors. Follow your health care provider's ecommendations for frequency of eye exams.  . Eat a healthy diet. Foods like vegetables, fruits, whole grains, low-fat dairy products, and lean protein foods contain the nutrients you need without too many calories. Decrease your intake of foods high in solid fats, added sugars, and salt. Eat the  right amount of calories for you. Get information about a proper diet from your health care provider, if necessary.  . Regular physical exercise is one of the most important things you can do for your health. Most adults should get at least 150 minutes of moderate-intensity exercise (any activity that increases your heart rate and causes you to sweat) each week. In addition, most adults need muscle-strengthening exercises on 2 or more days a week.  Silver Sneakers may be a benefit available to you. To determine eligibility, you may visit the website: www.silversneakers.com or contact program at (802) 246-8927 Mon-Fri between 8AM-8PM.   . Maintain a healthy weight. The body mass index (BMI) is a screening tool to identify possible weight problems. It provides an estimate of body fat based on height and weight. Your health care provider can find your BMI and can help you achieve or maintain a healthy weight.   For adults 20 years and older: ? A BMI below 18.5 is considered underweight. ? A BMI of 18.5 to 24.9 is normal. ? A BMI of 25 to 29.9 is considered overweight. ? A BMI of 30 and above is considered obese.   . Maintain normal blood lipids and cholesterol levels by exercising and minimizing your intake of saturated fat. Eat a balanced diet with plenty of fruit and vegetables. Blood tests for lipids and cholesterol  should begin at age 8 and be repeated every 5 years. If your lipid or cholesterol levels are high, you are over 50, or you are at high risk for heart disease, you may need your cholesterol levels checked more frequently. Ongoing high lipid and cholesterol levels should be treated with medicines if diet and exercise are not working.  . If you smoke, find out from your health care provider how to quit. If you do not use tobacco, please do not start.  . If you choose to drink alcohol, please do not consume more than 2 drinks per day. One drink is considered to be 12 ounces (355 mL) of  beer, 5 ounces (148 mL) of wine, or 1.5 ounces (44 mL) of liquor.  . If you are 80-80 years old, ask your health care provider if you should take aspirin to prevent strokes.  . Use sunscreen. Apply sunscreen liberally and repeatedly throughout the day. You should seek shade when your shadow is shorter than you. Protect yourself by wearing long sleeves, pants, a wide-brimmed hat, and sunglasses year round, whenever you are outdoors.  . Once a month, do a whole body skin exam, using a mirror to look at the skin on your back. Tell your health care provider of new moles, moles that have irregular borders, moles that are larger than a pencil eraser, or moles that have changed in shape or color.

## 2017-01-14 NOTE — Progress Notes (Signed)
Patient ID: Karla Willis, female   DOB: 05/04/1935, 82 y.o.   MRN: [id number]   Location:  [facility]  Place of Service:  OFFICE  Provider: Arletha Grippe, DO  Patient Care Team: Gildardo Cranker, DO as PCP - General (Internal Medicine) Nicholas Lose, MD as Consulting Physician (Hematology and Oncology) [facility] as Consulting Physician (Orthopedic Surgery)  Extended Emergency Contact Information Primary Emergency Contact: [name] Address: [address]          [place], [place] [address] [place] Phone: [phone] Mobile Phone: [phone] Relation: Daughter Secondary Emergency Contact: [name], [place] [address] [name] of [place] Phone: [phone] Relation: Daughter  Code Status: FULL CODE Goals of Care: Advanced Directive information Advanced Directives 01/14/2017  Does Patient Have a Medical Advance Directive? Yes  Type of Advance Directive [place]  Does patient want to make changes to medical advance directive? -  Copy of [place] in Chart? Yes     Chief Complaint  Patient presents with  . Annual Exam    extended visit  . MMSE    30/30 passed clock drawing    HPI: Patient is a 82 y.o. female seen in today for an annual wellness exam. She is still followed by ortho at [facility]. She rec'd left hip injection 2 weeks ago and will f/u Mar 13th for continued mx. Takes tramadol prn.  She has night time pain in her leg and foot on left that gets better with getting up and walking around. Tramadol helps.  DM - BS at home stable. No low BS reactions. She takes glipizide xl. She has some swelling in L>R foot. She takes actos. Occasional  numbness or tingling. Last A1c 5.7%. LDL 90  Leg cramps - improved. she had to stop gabapentin due to next day dizziness. She is taking tramadol prn which helps  HTN - stable on amlodipine, lisinopril  HCT  Thyroid - stable on levothyroxine. TSH 1.44  Arthritis - stable on tramadol. Takes allopurinol for gout. No recent gout attacks.   Hx breast CA - followed by oncology. Last mammogram in Dec 2017 was benign. She takes tamoxifen and has occasional night sweats and hot flashes. No signs of recurrence  Weight loss - albumin 3.3. She gets glucerna. Appetite improved since family moved into larger home. Weight Is stable     Depression screen [facility] 2/9 01/14/2017 01/14/2017 05/29/2016 04/18/2016 01/11/2016  Decreased Interest 0 0 0 0 0  Down, Depressed, Hopeless 0 0 0 0 0  PHQ - 2 Score 0 0 0 0 0    Fall Risk  01/14/2017 01/14/2017 11/05/2016 10/06/2016 07/25/2016  Falls in the past year? No No No No No   MMSE - Mini Mental State Exam 01/14/2017 01/11/2016  Not completed: - (No Data)  Orientation to time 5 5  Orientation to Place 5 5  Registration 3 3  Attention/ Calculation 5 5  Recall 3 2  Language- name 2 objects 2 2  Language- repeat 1 1  Language- follow 3 step command 3 3  Language- read & follow direction 1 1  Write a sentence 1 1  Copy design 1 1  Total score 30 29     Health Maintenance  Topic Date Due  . PNA vac Low Risk Adult (2 of 2 - PCV13) 07/03/2017 (Originally 11/10/2014)  . OPHTHALMOLOGY EXAM  01/08/2018 (Originally  11/06/1945)  . HEMOGLOBIN A1C  07/15/2017  . FOOT EXAM  11/05/2017  . TETANUS/TDAP  04/18/2026  . INFLUENZA VACCINE  Completed  . DEXA SCAN  Addressed    AWV note from 01/14/17 reviewed.    Past Medical History:  Diagnosis Date  . Allergy   . Arthritis   . 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 Cancer ([facility])   . Diabetes mellitus without complication ([facility])   . GERD (gastroesophageal reflux disease)    occ  . Gout   . History of hiatal hernia   . HOH (hard of hearing)   . Hypertension   . Hypothyroidism   . Pneumonia    hx  . Thyroid disease   . Wears glasses     Past Surgical History:  Procedure Laterality Date  . BREAST  BIOPSY Right    remote, benign  . BREAST LUMPECTOMY WITH NEEDLE LOCALIZATION Right 12/16/2013   Procedure: BREAST LUMPECTOMY WITH NEEDLE LOCALIZATION;  Surgeon: Edward Jolly, MD;  Location: [facility];  Service: General;  Laterality: Right;  . CHOLECYSTECTOMY  1970  . COLONOSCOPY    . FINGER SURGERY     right hand-  . gallbladder sugery    . RE-EXCISION OF BREAST LUMPECTOMY Right 01/30/2014   Procedure: RE-EXCISION OF BREAST LUMPECTOMY;  Surgeon: Edward Jolly, MD;  Location: [facility];  Service: General;  Laterality: Right;  . RE-EXCISION OF BREAST LUMPECTOMY Right 03/06/2014   Procedure: RE-EXCISION OF BREAST LUMPECTOMY;  Surgeon: Edward Jolly, MD;  Location: [facility];  Service: General;  Laterality: Right;  . SHOULDER ARTHROSCOPY  2011   left  . THYROID SURGERY  1970  . TOTAL SHOULDER ARTHROPLASTY Right 05/21/2016  . TOTAL SHOULDER ARTHROPLASTY Right 05/21/2016   Procedure: RIGHT TOTAL SHOULDER ARTHROPLASTY;  Surgeon: Ninetta Lights, MD;  Location: [facility];  Service: Orthopedics;  Laterality: Right;  . TUBAL LIGATION      Family History  Problem Relation Age of Onset  . Diabetes Mother   . Arthritis Father   . Seizures Sister   . Cancer Sister     breast  . Multiple sclerosis Son     Deceased    Family Status  Relation Status  . Mother Deceased at age 30  . Father Deceased  . Sister Deceased   55years old  . Sister Alive   28years old  . Son Alive   41 years old  . Daughter Alive   74 years old  . Son Alive   47 years old  . Daughter Alive   28 years old  . Sister   . Son     shoulder  Social History   Social History  . Marital status: Widowed    Spouse name: N/A  . Number of children: N/A  . Years of education: N/A   Occupational History  . Not on file.   Social History Main Topics  . Smoking status: Never Smoker  . Smokeless tobacco: Never Used  . Alcohol use No  . Drug use: No  .  Sexual activity: Not Currently     Comment: menarche age 4, G17, P26, first live birth age 76, menopause at 46, no HRT   Other Topics Concern  . Not on file   Social History Narrative   Diet: No      Do you drink/ eat things with caffeine? Yes      Marital status:  Widowed                           What year were you married ?  1956      Do you live in a house, apartment,assistred living, condo, trailer, etc.)?  apartment      Is it one or more stories?  one      How many persons live in your home ?  3      Do you have any pets in your home ?(please list)  No      Current or past profession:  Bank, Caregiver      Do you exercise?  No                            Type & how often:        Do you have a living will?  Yes      Do you have a DNR form?  Yes                     If not, do you want to discuss one?       Do you have signed POA?HPOA forms?                 If so, please bring to your        appointment          Allergies  Allergen Reactions  . Actos [Pioglitazone] Swelling  . Hydrocodone Nausea Only and Other (See Comments)    "bloated"  . Codeine Rash  . Neurontin [Gabapentin] Palpitations and Other (See Comments)    dizziness  . Other Rash    Nuts--PECANS  . Peanut-Containing Drug Products Rash    Allergies as of 01/14/2017      Reactions   Actos [pioglitazone] Swelling   Hydrocodone Nausea Only, Other (See Comments)   "bloated"   Codeine Rash   Neurontin [gabapentin] Palpitations, Other (See Comments)   dizziness   Other Rash   Nuts--PECANS   Peanut-containing Drug Products Rash      Medication List       Accurate as of 01/14/17  3:25 PM. Always use your most recent med list.          allopurinol 100 MG tablet Commonly known as:  ZYLOPRIM take 1 tablet by mouth once daily FOR GOUT PREVENTION   amLODipine 5 MG tablet Commonly known as:  NORVASC take 1 tablet by mouth once daily   feeding supplement (GLUCERNA SHAKE) Liqd Take 237 mLs by  mouth 2 (two) times daily between meals.   ferrous sulfate 325 (65 FE) MG tablet Take 1 tablet (325 mg total) by mouth daily with breakfast.   glipiZIDE 5 MG 24 hr tablet Commonly known as:  GLUCOTROL XL take 1 tablet by mouth once daily WITH BREAKFAST   levothyroxine 100 MCG tablet Commonly known as:  SYNTHROID, LEVOTHROID take 1 tablet by mouth once daily BEFORE BREAKFAST   lisinopril 20 MG tablet Commonly known as:  PRINIVIL,ZESTRIL Take 1 tablet (20 mg total) by mouth daily.   tamoxifen 20 MG tablet Commonly known as:  NOLVADEX Take 1 tablet (20 mg total) by mouth daily.   traMADol 50 MG tablet Commonly known as:  ULTRAM take 1 tablet by mouth three times a day with meals if needed for MODERATE TO SEVERE PAIN  Review of Systems:  Review of Systems  Physical Exam: Vitals:   01/14/17 1432  BP: 136/70  Pulse: 84  Temp: 98.4 F (36.9 C)  TempSrc: Oral  SpO2: 96%  Weight: 236 lb (107 kg)  Height: 5\' 2"  (1.575 m)   Body mass index is 43.16 kg/m. Physical Exam  Constitutional: She is oriented to person, place, and time. She appears well-developed and well-nourished. No distress.  HENT:  Head: Normocephalic and atraumatic.  Right Ear: Hearing, tympanic membrane, external ear and ear canal normal.  Left Ear: Hearing, tympanic membrane, external ear and ear canal normal.  Mouth/Throat: Uvula is midline, oropharynx is clear and moist and mucous membranes are normal. She does not have dentures. No oropharyngeal exudate.  Eyes: Conjunctivae, EOM and lids are normal. Pupils are equal, round, and reactive to light. No scleral icterus.  Neck: Trachea normal and normal range of motion. Neck supple. Carotid bruit is not present. No tracheal deviation present. Thyromegaly (mild) present. No thyroid mass present.  Cardiovascular: Normal rate, regular rhythm and intact distal pulses.  Exam reveals no gallop and no friction rub.   Murmur (2/6 SEM) heard. +2 pitting LE  edema. No calf TTP  Pulmonary/Chest: Effort normal and breath sounds normal. No stridor. No respiratory distress. She has no wheezes. She has no rhonchi. She has no rales. Right breast exhibits no inverted nipple, no mass, no nipple discharge, no skin change and no tenderness. Left breast exhibits no inverted nipple, no mass, no nipple discharge, no skin change and no tenderness. Breasts are symmetrical.  Abdominal: Soft. Normal appearance, normal aorta and bowel sounds are normal. She exhibits no distension, no pulsatile midline mass and no mass. There is no hepatosplenomegaly or hepatomegaly. There is no tenderness. There is no rigidity, no rebound and no guarding. No hernia.  Musculoskeletal: Normal range of motion. She exhibits edema and tenderness.  Right shoulder reduced ROM and swelling with TTP  Lymphadenopathy:       Head (right side): No posterior auricular adenopathy present.       Head (left side): No posterior auricular adenopathy present.    She has no cervical adenopathy.       Right: No supraclavicular adenopathy present.       Left: No supraclavicular adenopathy present.  Neurological: She is alert and oriented to person, place, and time. She has normal strength and normal reflexes. No cranial nerve deficit. Gait normal.  Skin: Skin is warm, dry and intact. Rash (eczematous rash on abdomen and acw. no vesicular formation) noted. Nails show no clubbing.     Egg sized freely mobile lipoma at sternoclavicular notch; right shoulder incisional scar with tiny area of eczema. No d/c  Psychiatric: She has a normal mood and affect. Her speech is normal and behavior is normal. Judgment and thought content normal. Cognition and memory are normal.    Labs reviewed:  Basic Metabolic Panel:  Recent Labs  04/18/16 1201  05/25/16 0645  10/06/16 0927 10/29/16 0830 01/12/17 0928  NA 142  < > 130*  < > 141 139 140  K 3.9  < > 3.7  < > 4.4 4.3 4.1  CL 103  < > 100*  < > 107 109 107  CO2  23  < > 22  < > 28 20 24   GLUCOSE 92  < > 102*  < > 94 83 78  BUN 28*  < > 32*  < > 44* 14 16  CREATININE 1.30*  < > 2.47*  < >  1.31* 0.93 0.86  CALCIUM 9.5  < > 8.5*  < > 9.5 9.2 8.8  MG  --   --  1.7  --   --   --   --   PHOS  --   --  3.2  --   --   --   --   TSH 0.574  --  1.442  --   --   --  0.67  < > = values in this interval not displayed. Liver Function Tests:  Recent Labs  05/27/16 0514 07/25/16 1454 01/12/17 0928  AST 33 11 12  ALT 23 7 7   ALKPHOS 50 53 64  BILITOT 0.4 0.4 0.3  PROT 5.2* 6.7 5.6*  ALBUMIN 2.3* 3.7 3.3*   No results for input(s): LIPASE, AMYLASE in the last 8760 hours. No results for input(s): AMMONIA in the last 8760 hours. CBC:  Recent Labs  04/18/16 1201  05/24/16 2306  05/26/16 0357 05/27/16 0514 09/30/16 1629  WBC 5.7  < > 11.9*  < > 6.0 5.8 7.3  NEUTROABS 4.1  --  9.0*  --   --   --  5,183  HGB  --   < > 9.1*  < > 8.2* 8.4* 11.7  HCT 36.0  < > 26.0*  < > 24.2* 24.9* 37.0  MCV 89  < > 83.1  < > 82.6 83.8 85.5  PLT 185  < > 201  < > 248 279 226  < > = values in this interval not displayed. Lipid Panel:  Recent Labs  04/18/16 1201 01/12/17 0928  CHOL 170 158  HDL 60 59  LDLCALC 90 83  TRIG 98 80  CHOLHDL 2.8 2.7   Lab Results  Component Value Date   HGBA1C 5.7 (H) 01/12/2017    Procedures: No results found. ECG OBTAINED AND REVIEWED BY MYSELF: NSR @ 66 bpm, LAD, LAE, poor R wave progression. No acute ischemic changes. No change since March 2017  Assessment/Plan   ICD-9-CM ICD-10-CM   1. Well adult exam V70.0 Z00.00   2. Essential hypertension, benign 401.1 I10 EKG 12-Lead     Basic Metabolic Panel  3. Leg cramps 729.82 R25.2   4. Type 2 diabetes mellitus with stage 2 chronic kidney disease, without long-term current use of insulin (HCC) 250.40 Q22.29 Basic Metabolic Panel   798.9 Q11.9 ALT     Lipid Panel     Hemoglobin A1c  5. Primary osteoarthritis involving multiple joints 715.09 M15.0   6. Hypothyroidism due to  acquired atrophy of thyroid 244.8 E03.4 TSH   246.8    7. Mild protein-calorie malnutrition (HCC) 263.1 E44.1   8. Bilateral edema of lower extremity 782.3 R60.0     Pt is UTD on health maintenance. Vaccinations are UTD. Pt maintains a healthy lifestyle. Encouraged pt to exercise 30-45 minutes 4-5 times per week. Eat a well balanced diet. Avoid smoking. Limit alcohol intake. Wear seatbelt when riding in the car. Wear sun block (SPF >50) when spending extended times outside.   Continue current medications as ordered  Follow up with ortho as scheduled for leg pain  Continue diet and exercise program  Follow up in 4 mos for DM, hypothyroidism and HTN. Fasting labs prior to appt   [place] S. Perlie Gold  [facility] [address] [place], [place] [address] [phone] Cell (Monday-Friday 8 AM - 5 PM) [phone] After 5 PM and follow prompts

## 2017-01-27 ENCOUNTER — Other Ambulatory Visit: Payer: Self-pay | Admitting: Nurse Practitioner

## 2017-01-27 DIAGNOSIS — M5416 Radiculopathy, lumbar region: Secondary | ICD-10-CM | POA: Diagnosis not present

## 2017-01-27 DIAGNOSIS — M545 Low back pain: Secondary | ICD-10-CM | POA: Diagnosis not present

## 2017-02-01 IMAGING — MG 2D DIGITAL DIAGNOSTIC BILATERAL MAMMOGRAM WITH CAD AND ADJUNCT T
8 of 18 series · 8 of 34 positions shown · non-contrast
Comparison: Previous exam(s).

CLINICAL DATA: Status post right lumpectomy for breast cancer in
0722. Previous right breast benign excisional biopsy.

EXAM:
2D DIGITAL DIAGNOSTIC BILATERAL MAMMOGRAM WITH CAD AND ADJUNCT TOMO

[L MLO (1 of 2)]
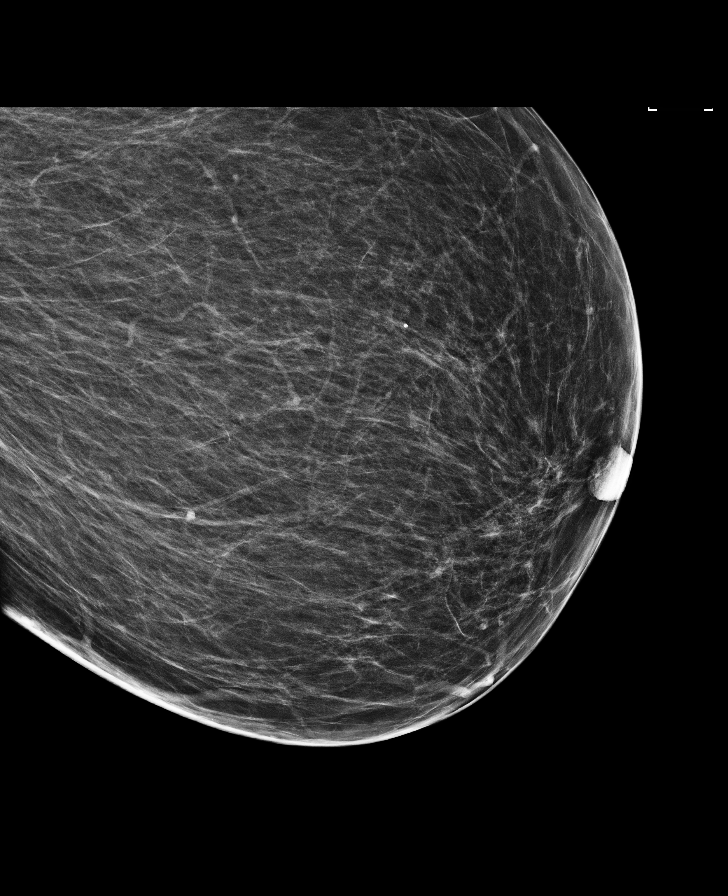

[L CC]
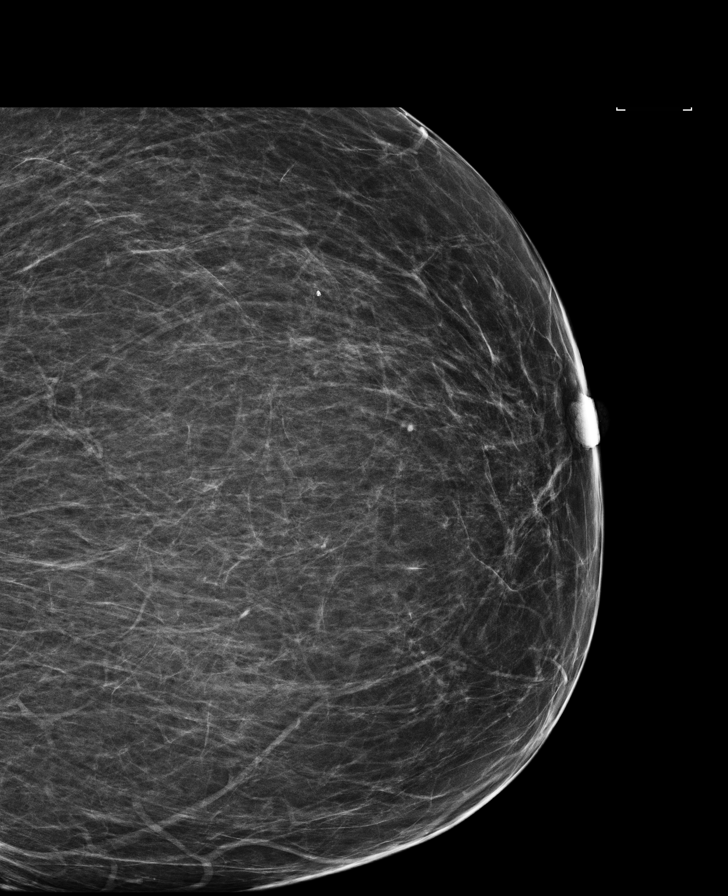

[R CC (1 of 2)]
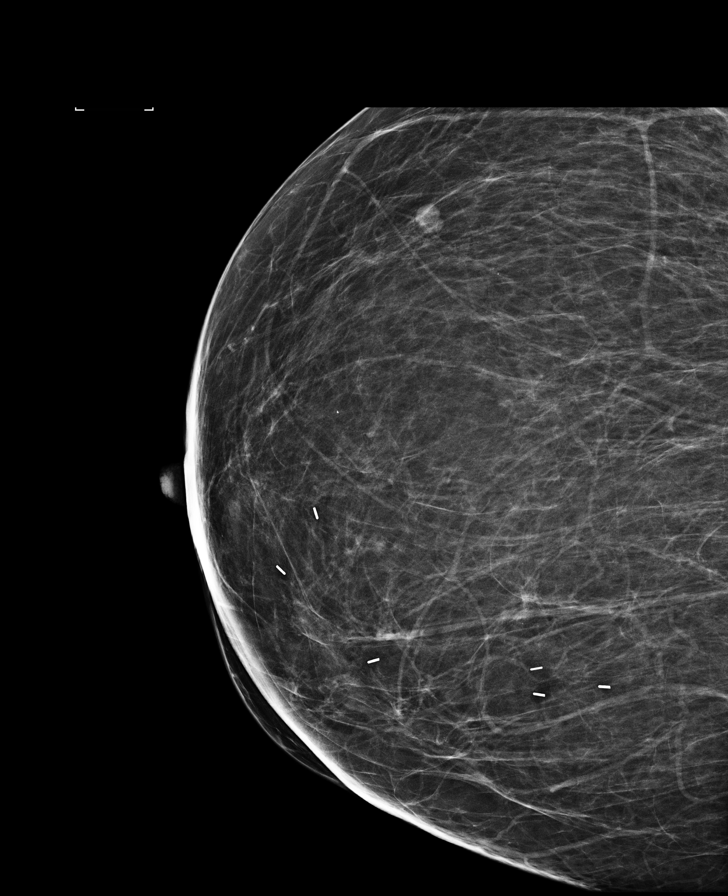

[R CC (2 of 2)]
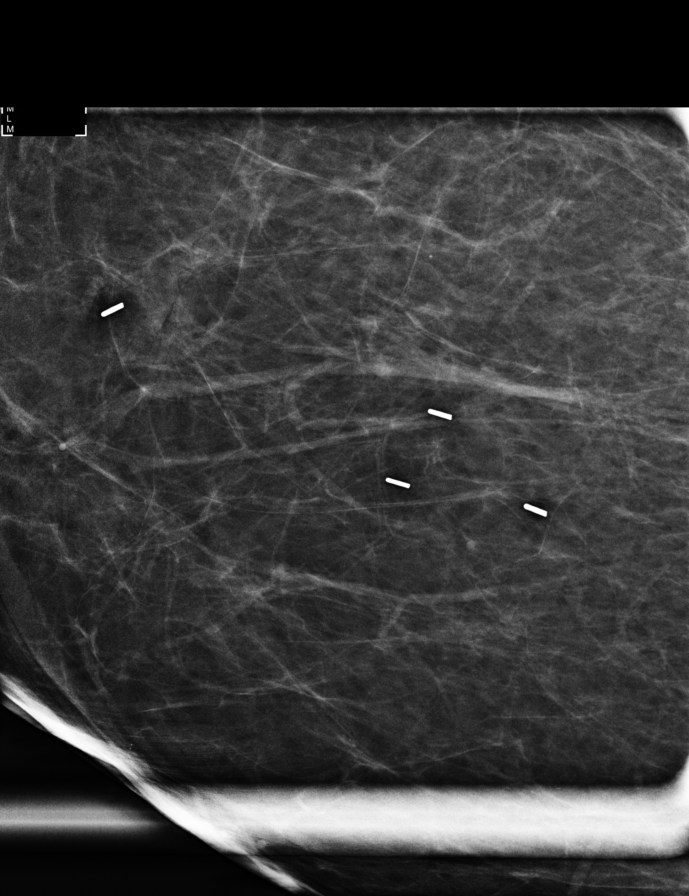

[R MLO]
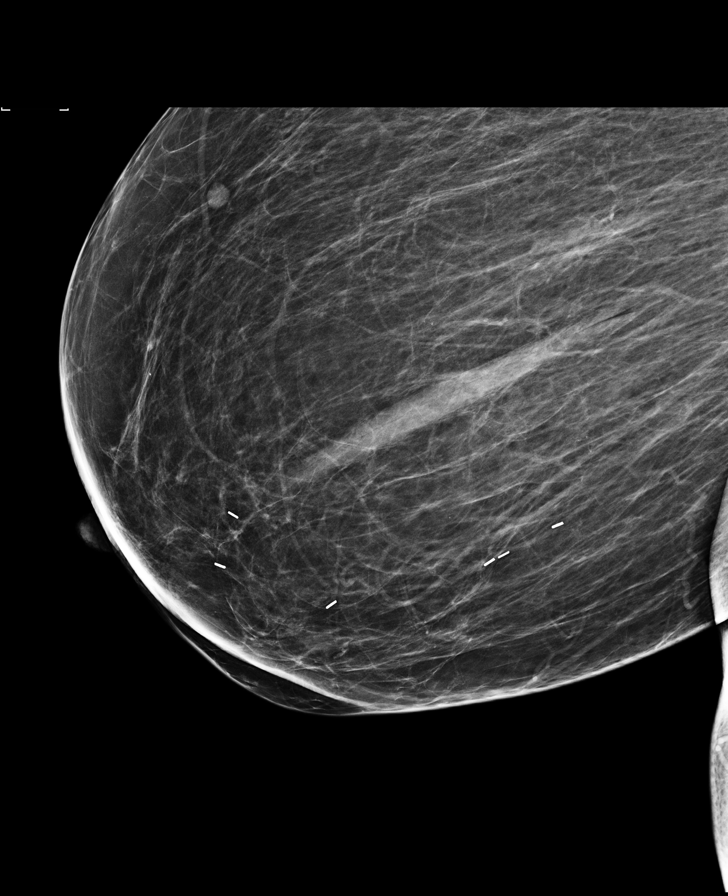

[R CV]
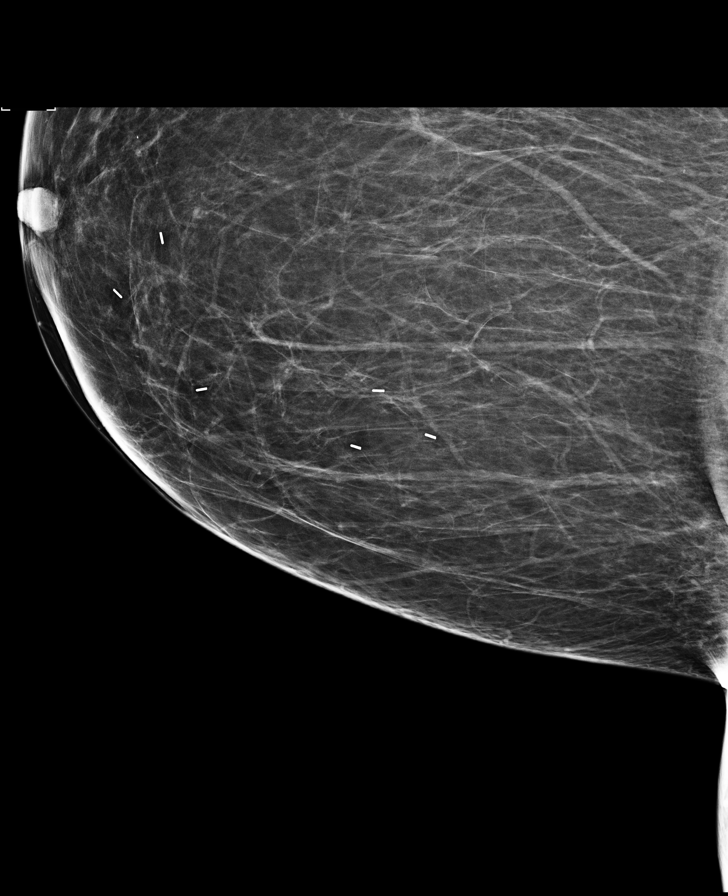

[L MLO (2 of 2)]
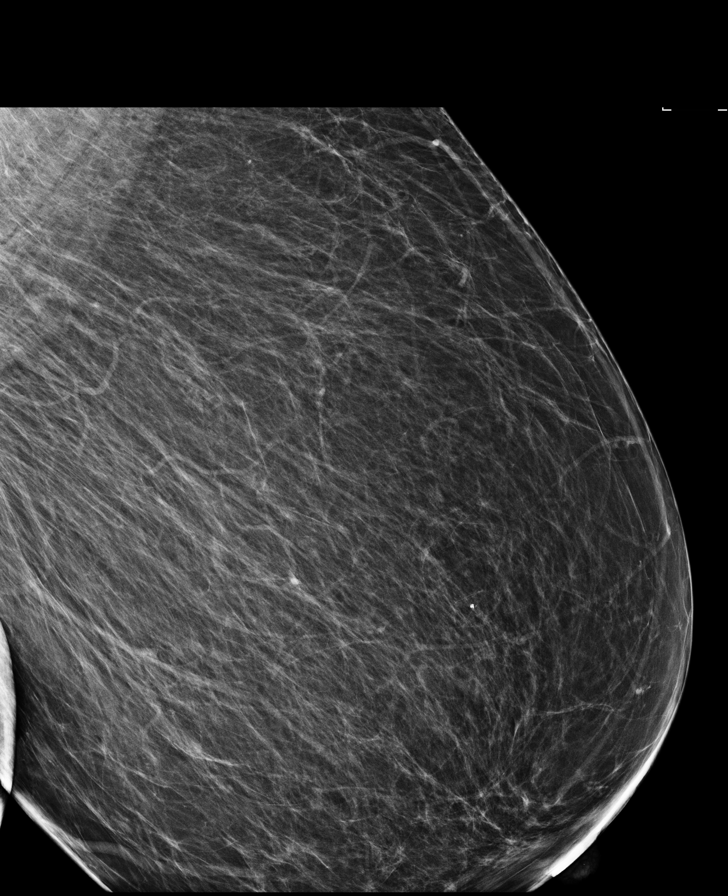

[L CC synth-2D]
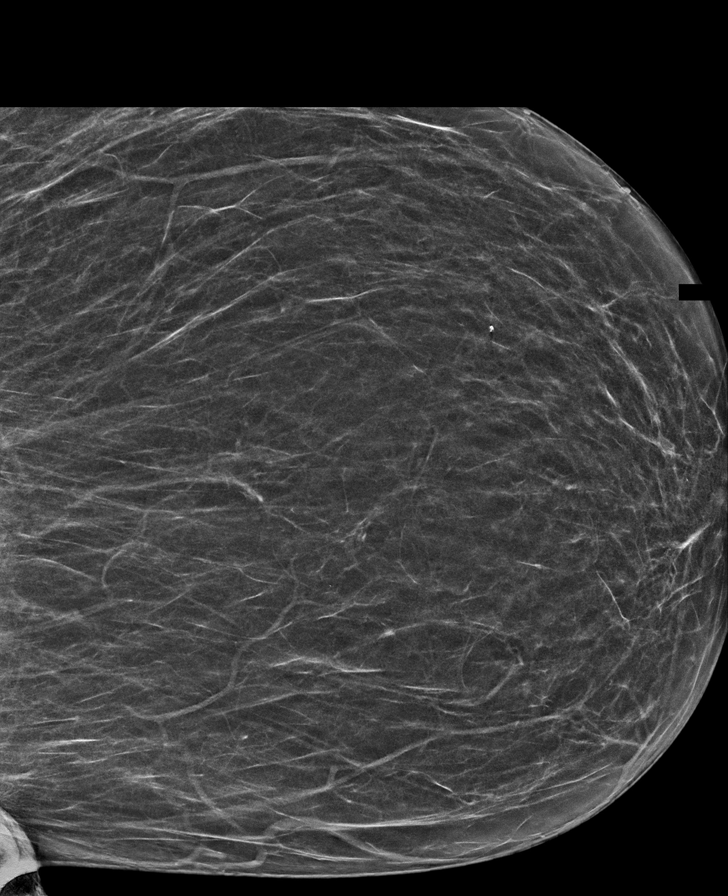

[8 of 34 positions shown; findings below may reference images not displayed]

ACR Breast Density Category b: There are scattered areas of
fibroglandular density.
FINDINGS: Stable post lumpectomy changes on the right. No interval findings
suspicious for malignancy in either breast.

Mammographic images were processed with CAD.
IMPRESSION: No evidence of malignancy.

RECOMMENDATION:
Bilateral diagnostic mammogram in 1 year.

I have discussed the findings and recommendations with the patient.
Results were also provided in writing at the conclusion of the
visit. If applicable, a reminder letter will be sent to the patient
regarding the next appointment.

BI-RADS CATEGORY  2: Benign.

## 2017-02-03 DIAGNOSIS — M79605 Pain in left leg: Secondary | ICD-10-CM | POA: Diagnosis not present

## 2017-02-03 DIAGNOSIS — M545 Low back pain: Secondary | ICD-10-CM | POA: Diagnosis not present

## 2017-02-03 DIAGNOSIS — M5416 Radiculopathy, lumbar region: Secondary | ICD-10-CM | POA: Diagnosis not present

## 2017-02-05 DIAGNOSIS — H40013 Open angle with borderline findings, low risk, bilateral: Secondary | ICD-10-CM | POA: Diagnosis not present

## 2017-02-05 DIAGNOSIS — H25812 Combined forms of age-related cataract, left eye: Secondary | ICD-10-CM | POA: Diagnosis not present

## 2017-02-05 DIAGNOSIS — E119 Type 2 diabetes mellitus without complications: Secondary | ICD-10-CM | POA: Diagnosis not present

## 2017-02-05 DIAGNOSIS — H25811 Combined forms of age-related cataract, right eye: Secondary | ICD-10-CM | POA: Diagnosis not present

## 2017-02-10 DIAGNOSIS — M79605 Pain in left leg: Secondary | ICD-10-CM | POA: Diagnosis not present

## 2017-02-10 DIAGNOSIS — M545 Low back pain: Secondary | ICD-10-CM | POA: Diagnosis not present

## 2017-02-10 DIAGNOSIS — M5416 Radiculopathy, lumbar region: Secondary | ICD-10-CM | POA: Diagnosis not present

## 2017-02-17 DIAGNOSIS — M5416 Radiculopathy, lumbar region: Secondary | ICD-10-CM | POA: Diagnosis not present

## 2017-02-17 DIAGNOSIS — M545 Low back pain: Secondary | ICD-10-CM | POA: Diagnosis not present

## 2017-02-17 DIAGNOSIS — M79605 Pain in left leg: Secondary | ICD-10-CM | POA: Diagnosis not present

## 2017-02-23 DIAGNOSIS — H2511 Age-related nuclear cataract, right eye: Secondary | ICD-10-CM | POA: Diagnosis not present

## 2017-02-24 ENCOUNTER — Telehealth: Payer: Self-pay | Admitting: Hematology and Oncology

## 2017-02-24 NOTE — Telephone Encounter (Signed)
sw pt to confirm r/s appt to 5/9 at 2 pm per MD out of office

## 2017-02-25 DIAGNOSIS — H25811 Combined forms of age-related cataract, right eye: Secondary | ICD-10-CM | POA: Diagnosis not present

## 2017-02-25 DIAGNOSIS — H2511 Age-related nuclear cataract, right eye: Secondary | ICD-10-CM | POA: Diagnosis not present

## 2017-02-27 ENCOUNTER — Ambulatory Visit: Payer: Medicare Other | Admitting: Hematology and Oncology

## 2017-02-28 ENCOUNTER — Other Ambulatory Visit: Payer: Self-pay | Admitting: Internal Medicine

## 2017-03-11 ENCOUNTER — Ambulatory Visit: Payer: Medicare Other | Admitting: Hematology and Oncology

## 2017-03-15 ENCOUNTER — Other Ambulatory Visit: Payer: Self-pay | Admitting: Internal Medicine

## 2017-03-15 DIAGNOSIS — E119 Type 2 diabetes mellitus without complications: Secondary | ICD-10-CM

## 2017-03-17 NOTE — Assessment & Plan Note (Deleted)
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 Patient did not undergo radiation therapy he 100% PR 60% positive. She was started on tamoxifen in April 2015.  Tamoxifen toxicities: 1. Hot flashes occasional  Breast cancer surveillance: 1. Mammograms 11/22/16 were normal 2. Breast exam 03/18/17 is normal  Hypertension and diabetes: Being managed by her primary care physician Weight loss: Patient has lost 22 pounds in 6 months. She is really not been doing anything to lead to the weight loss. She is very happy about it. She has good appetite. There are no clinical signs or symptoms of disease. I encouraged her to watch and monitor it.  Left leg pain: Much improved Return to clinic in 1 year for follow-up

## 2017-03-18 ENCOUNTER — Ambulatory Visit: Payer: Medicare Other | Admitting: Hematology and Oncology

## 2017-03-24 ENCOUNTER — Encounter: Payer: Self-pay | Admitting: Hematology and Oncology

## 2017-03-24 ENCOUNTER — Ambulatory Visit (HOSPITAL_BASED_OUTPATIENT_CLINIC_OR_DEPARTMENT_OTHER): Payer: Medicare Other | Admitting: Hematology and Oncology

## 2017-03-24 DIAGNOSIS — M79605 Pain in left leg: Secondary | ICD-10-CM

## 2017-03-24 DIAGNOSIS — E119 Type 2 diabetes mellitus without complications: Secondary | ICD-10-CM

## 2017-03-24 DIAGNOSIS — Z170 Estrogen receptor positive status [ER+]: Secondary | ICD-10-CM | POA: Diagnosis not present

## 2017-03-24 DIAGNOSIS — I10 Essential (primary) hypertension: Secondary | ICD-10-CM | POA: Diagnosis not present

## 2017-03-24 DIAGNOSIS — Z79810 Long term (current) use of selective estrogen receptor modulators (SERMs): Secondary | ICD-10-CM | POA: Diagnosis not present

## 2017-03-24 DIAGNOSIS — D0511 Intraductal carcinoma in situ of right breast: Secondary | ICD-10-CM | POA: Diagnosis not present

## 2017-03-24 DIAGNOSIS — C50311 Malignant neoplasm of lower-inner quadrant of right female breast: Secondary | ICD-10-CM

## 2017-03-24 DIAGNOSIS — E669 Obesity, unspecified: Secondary | ICD-10-CM

## 2017-03-24 MED ORDER — TAMOXIFEN CITRATE 20 MG PO TABS: 20.0000 mg | ORAL_TABLET | Freq: Every day | ORAL | 3 refills | Status: DC

## 2017-03-24 NOTE — Progress Notes (Signed)
Patient Care Team: Gildardo Cranker, DO as PCP - General (Internal Medicine) Nicholas Lose, MD as Consulting Physician (Hematology and Oncology) [facility] [employer] as Consulting Physician (Orthopedic Surgery)  DIAGNOSIS:  Encounter Diagnosis  Name Primary?  . Malignant neoplasm of lower-inner quadrant of right breast of female, estrogen receptor positive ([facility])     SUMMARY OF ONCOLOGIC HISTORY:   Breast cancer of lower-inner quadrant of right female breast ([facility])   10/19/2013 Initial Diagnosis    DCIS ER/PR positive low grade      12/16/2013 Surgery    Lumpectomy right breast: DCIS with calcifications low-grade 1.6 cm focally 0.1 cm to lateral margin, excision of right posterior margin continued to show DCIS that led to the third surgery for reexcision which was negative for DCIS ER 100% PR 60%      01/09/2014 -  Anti-estrogen oral therapy    Tamoxifen 20 mg daily (patient not a candidate for radiation because of body habitus)       CHIEF COMPLIANT: Follow-up on tamoxifen therapy  INTERVAL HISTORY: Karla Willis is a 82 year old with above-mentioned history of right breast DCIS underwent lumpectomy and is currently on tamoxifen therapy. She has been tolerating tamoxifen fairly well except for occasional hot flashes. She was hospitalized for acute on chronic renal failure in July 2017. She continues to have intermittent problems with leg pain.  REVIEW OF SYSTEMS:   Constitutional: Denies fevers, chills or abnormal weight loss Eyes: Denies blurriness of vision Ears, nose, mouth, throat, and face: Denies mucositis or sore throat Respiratory: Denies cough, dyspnea or wheezes Cardiovascular: Denies palpitation, chest discomfort Gastrointestinal:  Denies nausea, heartburn or change in bowel habits Skin: Denies abnormal skin rashes Lymphatics: Denies new lymphadenopathy or easy bruising Neurological:Denies numbness, tingling or new weaknesses Behavioral/Psych: Mood  is stable, no new changes  Extremities: Abdomen and left leg pain Breast:  denies any pain or lumps or nodules in either breasts All other systems were reviewed with the patient and are negative.  I have reviewed the past medical history, past surgical history, social history and family history with the patient and they are unchanged from previous note.  ALLERGIES:  is allergic to actos [pioglitazone]; hydrocodone; codeine; neurontin [gabapentin]; other; and peanut-containing drug products.  MEDICATIONS:  Current Outpatient Prescriptions  Medication Sig Dispense Refill  . allopurinol (ZYLOPRIM) 100 MG tablet take 1 tablet by mouth once daily FOR GOUT PREVENTION 30 tablet 4  . amLODipine (NORVASC) 5 MG tablet take 1 tablet by mouth once daily 30 tablet 5  . feeding supplement, GLUCERNA SHAKE, (GLUCERNA SHAKE) LIQD Take 237 mLs by mouth 2 (two) times daily between meals. 30 Can 0  . ferrous sulfate 325 (65 FE) MG tablet take 1 tablet by mouth once daily with BREAKFAST 30 tablet 3  . glipiZIDE (GLUCOTROL XL) 5 MG 24 hr tablet take 1 tablet by mouth once daily with BREAKFAST 30 tablet 6  . levothyroxine (SYNTHROID, LEVOTHROID) 100 MCG tablet take 1 tablet by mouth once daily BEFORE BREAKFAST 30 tablet 5  . lisinopril (PRINIVIL,ZESTRIL) 20 MG tablet take 1 tablet by mouth once daily 30 tablet 3  . tamoxifen (NOLVADEX) 20 MG tablet Take 1 tablet (20 mg total) by mouth daily. 90 tablet 3  . traMADol (ULTRAM) 50 MG tablet take 1 tablet by mouth three times a day with meals if needed for MODERATE TO SEVERE PAIN 90 tablet 1   No current facility-administered medications for this visit.     PHYSICAL EXAMINATION: ECOG  PERFORMANCE STATUS: 1 - Symptomatic but completely ambulatory  There were no vitals filed for this visit. There were no vitals filed for this visit.  GENERAL:alert, no distress and comfortable SKIN: skin color, texture, turgor are normal, no rashes or significant lesions EYES:  normal, Conjunctiva are pink and non-injected, sclera clear OROPHARYNX:no exudate, no erythema and lips, buccal mucosa, and tongue normal  NECK: supple, thyroid normal size, non-tender, without nodularity LYMPH:  no palpable lymphadenopathy in the cervical, axillary or inguinal LUNGS: clear to auscultation and percussion with normal breathing effort HEART: regular rate & rhythm and no murmurs and no lower extremity edema ABDOMEN:abdomen soft, non-tender and normal bowel sounds MUSCULOSKELETAL:no cyanosis of digits and no clubbing  NEURO: alert & oriented x 3 with fluent speech, no focal motor/sensory deficits EXTREMITIES: No lower extremity edema BREAST: No palpable masses or nodules in either right or left breasts. No palpable axillary supraclavicular or infraclavicular adenopathy no breast tenderness or nipple discharge. (exam performed in the presence of a chaperone)  LABORATORY DATA:  I have reviewed the data as listed   Chemistry      Component Value Date/Time   NA 140 01/12/2017 0928   NA 142 04/18/2016 1201   NA 143 02/07/2015 1106   K 4.1 01/12/2017 0928   K 4.6 02/07/2015 1106   CL 107 01/12/2017 0928   CO2 24 01/12/2017 0928   CO2 22 02/07/2015 1106   BUN 16 01/12/2017 0928   BUN 28 (H) 04/18/2016 1201   BUN 21.3 02/07/2015 1106   CREATININE 0.86 01/12/2017 0928   CREATININE 1.1 02/07/2015 1106      Component Value Date/Time   CALCIUM 8.8 01/12/2017 0928   CALCIUM 9.0 02/07/2015 1106   ALKPHOS 64 01/12/2017 0928   ALKPHOS 53 02/07/2015 1106   AST 12 01/12/2017 0928   AST 17 02/07/2015 1106   ALT 7 01/12/2017 0928   ALT 11 02/07/2015 1106   BILITOT 0.3 01/12/2017 0928   BILITOT 0.3 04/18/2016 1201   BILITOT 0.34 02/07/2015 1106       Lab Results  Component Value Date   WBC 7.3 09/30/2016   HGB 11.7 09/30/2016   HCT 37.0 09/30/2016   MCV 85.5 09/30/2016   PLT 226 09/30/2016   NEUTROABS 5,183 09/30/2016    ASSESSMENT & PLAN:  Breast cancer of  lower-inner quadrant of right female breast ([facility]) DCIS right breast 1.6 cm wide 3 reexcision surgeries were negative margins. Patient did not undergo radiation therapy he 100% PR 60% positive. She was started on tamoxifen in April 2015.  Tamoxifen toxicities: 1. Hot flashes occasional  Breast cancer surveillance: 1. Mammograms November 2017 were normal 2. Breast exam 03/24/2017 is normal Patient had right shoulder replacement surgeryand has improved pain and range of motion  Hypertension and diabetes: Being managed by her primary care physician Weight loss: Patient had significant weight loss last year but has stabilized at this current weight. She is still obese. Hospitalization07/15/2017 due to acute on chronic renal failure  Left leg pain: Probably related to sciatica. Return to clinic in 1 year for follow-up   I spent 15 minutes talking to the patient of which more than half was spent in counseling and coordination of care.  No orders of the defined types were placed in this encounter.  The patient has a good understanding of the overall plan. she agrees with it. she will call with any problems that may develop before the next visit here.   Rulon Eisenmenger, MD  03/24/17    

## 2017-03-24 NOTE — Assessment & Plan Note (Signed)
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 Patient did not undergo radiation therapy he 100% PR 60% positive. She was started on tamoxifen in April 2015.  Tamoxifen toxicities: 1. Hot flashes occasional  Breast cancer surveillance: 1. Mammograms November 2017 were normal 2. Breast exam 03/24/2017 is normal  Hypertension and diabetes: Being managed by her primary care physician Weight loss: Patient had significant weight loss Hospitalization07/15/2017 due to acute on chronic renal failure  Left leg pain: Much improved Return to clinic in 1 year for follow-up

## 2017-04-12 ENCOUNTER — Other Ambulatory Visit: Payer: Self-pay | Admitting: Internal Medicine

## 2017-04-23 ENCOUNTER — Ambulatory Visit (INDEPENDENT_AMBULATORY_CARE_PROVIDER_SITE_OTHER): Payer: Medicare Other | Admitting: Internal Medicine

## 2017-04-23 ENCOUNTER — Encounter: Payer: Self-pay | Admitting: Internal Medicine

## 2017-04-23 VITALS — BP 160/70 | HR 86 | Temp 98.6°F | Wt 243.0 lb

## 2017-04-23 DIAGNOSIS — M940 Chondrocostal junction syndrome [Tietze]: Secondary | ICD-10-CM | POA: Diagnosis not present

## 2017-04-23 DIAGNOSIS — R0782 Intercostal pain: Secondary | ICD-10-CM | POA: Diagnosis not present

## 2017-04-23 NOTE — Patient Instructions (Signed)
You may continue with ibuprofen 500mg  daily just through the weekend.  If your pain is not better on Monday, please call back so we can run more tests.

## 2017-04-23 NOTE — Progress Notes (Signed)
Location:  [facility] clinic Provider: Brainard Highfill L. Mariea Clonts, D.O., C.M.D.  Code Status: DNR Goals of Care:  Advanced Directives 03/24/2017  Does Patient Have a Medical Advance Directive? Yes  Type of Advance Directive -  Does patient want to make changes to medical advance directive? -  Copy of [place] in Chart? -    Chief Complaint  Patient presents with  . Acute Visit    chest pain    HPI: Patient is a 82 y.o. female with h/o hypertension, diabetes mellitus 2, hypothyroid, acute on chronic renal failure, prior right breast cancer seen today for an acute visit for chest pain radiating down her left arm.  She had walked in with this complaint after complaining of this symptom at the dentist's office. She has had the pain for 3 days.  BP elevated at 160/70.  She tells me that on Sunday, her left shoulder was painful down to her fingers.  Her hand was swelled and she could not bend her arm.  It got better by Tuesday.  Now, for 2 days, the right arm is hurting and her chest on that side is hurting.  She took ibuprofen the last two nights which did help. She could hardly get in bed to lie down the first night.  Today (Friday), it's not as bad as yesterday.  The ibuprofen she took was 500mg  from her daughter.  Yesterday, bp was 481 systolic.  She's had no cough, but does note that the pain worsens with deep breaths but also if she moves her arms or turns her body.  Pulse ox normal.  No URI symptoms.  No fevers, chills.    Past Medical History:  Diagnosis Date  . Allergy   . Arthritis   . Breast cancer ([place]) 10/24/13   right upper inner- DCIS  . Cancer ([place])   . Diabetes mellitus without complication ([place])   . GERD (gastroesophageal reflux disease)    occ  . Gout   . History of hiatal hernia   . HOH (hard of hearing)   . Hypertension   . Hypothyroidism   . Pneumonia    hx  . Thyroid disease   . Wears glasses     Past Surgical History:  Procedure Laterality Date  .  BREAST BIOPSY Right    remote, benign  . BREAST LUMPECTOMY WITH NEEDLE LOCALIZATION Right 12/16/2013   Procedure: BREAST LUMPECTOMY WITH NEEDLE LOCALIZATION;  Surgeon: Edward Jolly, MD;  Location: [place];  Service: General;  Laterality: Right;  . CHOLECYSTECTOMY  1970  . COLONOSCOPY    . FINGER SURGERY     right hand-  . gallbladder sugery    . RE-EXCISION OF BREAST LUMPECTOMY Right 01/30/2014   Procedure: RE-EXCISION OF BREAST LUMPECTOMY;  Surgeon: Edward Jolly, MD;  Location: [place];  Service: General;  Laterality: Right;  . RE-EXCISION OF BREAST LUMPECTOMY Right 03/06/2014   Procedure: RE-EXCISION OF BREAST LUMPECTOMY;  Surgeon: Edward Jolly, MD;  Location: [place];  Service: General;  Laterality: Right;  . SHOULDER ARTHROSCOPY  2011   left  . THYROID SURGERY  1970  . TOTAL SHOULDER ARTHROPLASTY Right 05/21/2016  . TOTAL SHOULDER ARTHROPLASTY Right 05/21/2016   Procedure: RIGHT TOTAL SHOULDER ARTHROPLASTY;  Surgeon: Ninetta Lights, MD;  Location: [place];  Service: Orthopedics;  Laterality: Right;  . TUBAL LIGATION      Allergies  Allergen Reactions  . Actos [Pioglitazone] Swelling  .  Hydrocodone Nausea Only and Other (See Comments)    "bloated"  . Codeine Rash  . Neurontin [Gabapentin] Palpitations and Other (See Comments)    dizziness  . Other Rash    Nuts--PECANS  . Peanut-Containing Drug Products Rash    Allergies as of 04/23/2017      Reactions   Actos [pioglitazone] Swelling   Hydrocodone Nausea Only, Other (See Comments)   "bloated"   Codeine Rash   Neurontin [gabapentin] Palpitations, Other (See Comments)   dizziness   Other Rash   Nuts--PECANS   Peanut-containing Drug Products Rash      Medication List       Accurate as of 04/23/17 11:52 AM. Always use your most recent med list.          allopurinol 100 MG tablet Commonly known as:  ZYLOPRIM take 1 tablet by mouth once daily FOR  GOUT PREVENTION   amLODipine 5 MG tablet Commonly known as:  NORVASC take 1 tablet by mouth once daily   feeding supplement (GLUCERNA SHAKE) Liqd Take 237 mLs by mouth 2 (two) times daily between meals.   ferrous sulfate 325 (65 FE) MG tablet take 1 tablet by mouth once daily with BREAKFAST   glipiZIDE 5 MG 24 hr tablet Commonly known as:  GLUCOTROL XL take 1 tablet by mouth once daily with BREAKFAST   levothyroxine 100 MCG tablet Commonly known as:  SYNTHROID, LEVOTHROID take 1 tablet by mouth once daily BEFORE BREAKFAST   lisinopril 20 MG tablet Commonly known as:  PRINIVIL,ZESTRIL take 1 tablet by mouth once daily   tamoxifen 20 MG tablet Commonly known as:  NOLVADEX Take 1 tablet (20 mg total) by mouth daily.   traMADol 50 MG tablet Commonly known as:  ULTRAM take 1 tablet by mouth three times a day with meals if needed for MODERATE TO SEVERE PAIN       Review of Systems:  Review of Systems  Constitutional: Negative for chills, diaphoresis, fever, malaise/fatigue and weight loss.  HENT: Negative for congestion.   Respiratory: Negative for cough, sputum production, shortness of breath and wheezing.   Cardiovascular: Positive for chest pain and leg swelling. Negative for palpitations, orthopnea, claudication and PND.  Gastrointestinal: Negative for abdominal pain.  Genitourinary: Negative for dysuria.  Musculoskeletal: Positive for joint pain and myalgias. Negative for falls.       Shoulders  Skin: Negative for itching and rash.  Neurological: Negative for dizziness, tingling, sensory change, loss of consciousness and weakness.  Endo/Heme/Allergies: Does not bruise/bleed easily.  Psychiatric/Behavioral: Negative for depression and memory loss. The patient is not nervous/anxious.        No increase in stress    Health Maintenance  Topic Date Due  . OPHTHALMOLOGY EXAM  01/08/2018 (Originally 11/06/1945)  . INFLUENZA VACCINE  06/10/2017  . HEMOGLOBIN A1C   07/15/2017  . FOOT EXAM  11/05/2017  . TETANUS/TDAP  04/18/2026  . DEXA SCAN  Completed  . PNA vac Low Risk Adult  Completed    Physical Exam: Vitals:   04/23/17 1134  BP: (!) 160/70  Pulse: 86  Temp: 98.6 F (37 C)  TempSrc: Oral  SpO2: 97%  Weight: 243 lb (110.2 kg)   Body mass index is 44.45 kg/m. Physical Exam  Constitutional: She is oriented to person, place, and time. She appears well-developed and well-nourished. No distress.  HENT:  Head: Normocephalic and atraumatic.  Neck: Neck supple. No JVD present.  Cardiovascular: Normal rate, regular rhythm, normal heart sounds and intact  distal pulses.   Pulmonary/Chest: Effort normal and breath sounds normal. No respiratory distress. She has no wheezes. She has no rales. She exhibits tenderness.  Tender on right side of chest, made worse with movement of both arms and when climbing up and down from exam table  Abdominal: Bowel sounds are normal.  Musculoskeletal: Normal range of motion. She exhibits tenderness.  Neurological: She is alert and oriented to person, place, and time.  Skin: Skin is warm and dry.  Psychiatric: She has a normal mood and affect.    Labs reviewed: Basic Metabolic Panel:  Recent Labs  05/25/16 0645  10/06/16 0927 10/29/16 0830 01/12/17 0928  NA 130*  < > 141 139 140  K 3.7  < > 4.4 4.3 4.1  CL 100*  < > 107 109 107  CO2 22  < > 28 20 24   GLUCOSE 102*  < > 94 83 78  BUN 32*  < > 44* 14 16  CREATININE 2.47*  < > 1.31* 0.93 0.86  CALCIUM 8.5*  < > 9.5 9.2 8.8  MG 1.7  --   --   --   --   PHOS 3.2  --   --   --   --   TSH 1.442  --   --   --  0.67  < > = values in this interval not displayed. Liver Function Tests:  Recent Labs  05/27/16 0514 07/25/16 1454 01/12/17 0928  AST 33 11 12  ALT 23 7 7   ALKPHOS 50 53 64  BILITOT 0.4 0.4 0.3  PROT 5.2* 6.7 5.6*  ALBUMIN 2.3* 3.7 3.3*   No results for input(s): LIPASE, AMYLASE in the last 8760 hours. No results for input(s): AMMONIA in  the last 8760 hours. CBC:  Recent Labs  05/24/16 2306  05/26/16 0357 05/27/16 0514 09/30/16 1629  WBC 11.9*  < > 6.0 5.8 7.3  NEUTROABS 9.0*  --   --   --  5,183  HGB 9.1*  < > 8.2* 8.4* 11.7  HCT 26.0*  < > 24.2* 24.9* 37.0  MCV 83.1  < > 82.6 83.8 85.5  PLT 201  < > 248 279 226  < > = values in this interval not displayed. Lipid Panel:  Recent Labs  01/12/17 0928  CHOL 158  HDL 59  LDLCALC 83  TRIG 80  CHOLHDL 2.7   Lab Results  Component Value Date   HGBA1C 5.7 (H) 01/12/2017    Procedures since last visit: EKG today with NSR at 79bpm  Assessment/Plan 1. Intercostal pain -pain reproducible on palpation and with ROM and deep breathing -advised to use the ibuprofen through the weekend and if not better by Monday, call back -also, if worse, develops shortness of breath, palpitations, sweats, fevers, to proceed to ED over weekend  2. Costochondritis, acute -see #1  25 minutes were spent seeing pt, obtaining history, exam, reviewing labs from past and EKG   Labs/tests ordered:  EKG  Next appt:  05/18/2017  Kjersti Dittmer L. Sharlett Lienemann, D.O. [facility] [address], [place] [address] Cell Phone (Mon-Fri 8am-5pm):  [phone] On Call:  [phone] & follow prompts after 5pm & weekends Office Phone:  [phone] Office Fax:  [phone]

## 2017-04-27 ENCOUNTER — Ambulatory Visit: Payer: Medicare Other | Admitting: Internal Medicine

## 2017-05-15 ENCOUNTER — Other Ambulatory Visit: Payer: Self-pay | Admitting: Internal Medicine

## 2017-05-18 ENCOUNTER — Other Ambulatory Visit: Payer: Medicare Other

## 2017-05-19 ENCOUNTER — Other Ambulatory Visit: Payer: Medicare Other

## 2017-05-19 DIAGNOSIS — I10 Essential (primary) hypertension: Secondary | ICD-10-CM | POA: Diagnosis not present

## 2017-05-19 DIAGNOSIS — E1122 Type 2 diabetes mellitus with diabetic chronic kidney disease: Secondary | ICD-10-CM

## 2017-05-19 DIAGNOSIS — N182 Chronic kidney disease, stage 2 (mild): Secondary | ICD-10-CM | POA: Diagnosis not present

## 2017-05-19 DIAGNOSIS — E034 Atrophy of thyroid (acquired): Secondary | ICD-10-CM | POA: Diagnosis not present

## 2017-05-20 ENCOUNTER — Encounter: Payer: Self-pay | Admitting: Internal Medicine

## 2017-05-20 ENCOUNTER — Ambulatory Visit (INDEPENDENT_AMBULATORY_CARE_PROVIDER_SITE_OTHER): Payer: Medicare Other | Admitting: Internal Medicine

## 2017-05-20 VITALS — BP 132/70 | HR 79 | Temp 98.6°F | Ht 62.0 in | Wt 242.8 lb

## 2017-05-20 DIAGNOSIS — R600 Localized edema: Secondary | ICD-10-CM

## 2017-05-20 DIAGNOSIS — I10 Essential (primary) hypertension: Secondary | ICD-10-CM | POA: Diagnosis not present

## 2017-05-20 DIAGNOSIS — Z853 Personal history of malignant neoplasm of breast: Secondary | ICD-10-CM

## 2017-05-20 DIAGNOSIS — E785 Hyperlipidemia, unspecified: Secondary | ICD-10-CM | POA: Diagnosis not present

## 2017-05-20 DIAGNOSIS — E034 Atrophy of thyroid (acquired): Secondary | ICD-10-CM

## 2017-05-20 DIAGNOSIS — M1000 Idiopathic gout, unspecified site: Secondary | ICD-10-CM | POA: Diagnosis not present

## 2017-05-20 DIAGNOSIS — M8949 Other hypertrophic osteoarthropathy, multiple sites: Secondary | ICD-10-CM

## 2017-05-20 DIAGNOSIS — E1122 Type 2 diabetes mellitus with diabetic chronic kidney disease: Secondary | ICD-10-CM | POA: Diagnosis not present

## 2017-05-20 DIAGNOSIS — N182 Chronic kidney disease, stage 2 (mild): Secondary | ICD-10-CM

## 2017-05-20 DIAGNOSIS — M150 Primary generalized (osteo)arthritis: Secondary | ICD-10-CM

## 2017-05-20 DIAGNOSIS — M159 Polyosteoarthritis, unspecified: Secondary | ICD-10-CM

## 2017-05-20 DIAGNOSIS — L255 Unspecified contact dermatitis due to plants, except food: Secondary | ICD-10-CM

## 2017-05-20 LAB — BASIC METABOLIC PANEL
BUN: 19 mg/dL (ref 7–25)
CO2: 23 mmol/L (ref 20–31)
Calcium: 9.4 mg/dL (ref 8.6–10.4)
Chloride: 109 mmol/L (ref 98–110)
Creat: 1.03 mg/dL — ABNORMAL HIGH (ref 0.60–0.93)
Glucose, Bld: 105 mg/dL — ABNORMAL HIGH (ref 65–99)
Potassium: 4.8 mmol/L (ref 3.5–5.3)
Sodium: 143 mmol/L (ref 135–146)

## 2017-05-20 LAB — LIPID PANEL
Cholesterol: 154 mg/dL (ref <200)
HDL: 60 mg/dL (ref >50)
LDL Cholesterol: 74 mg/dL (ref <100)
Total CHOL/HDL Ratio: 2.6 Ratio (ref <5.0)
Triglycerides: 98 mg/dL (ref <150)
VLDL: 20 mg/dL (ref <30)

## 2017-05-20 LAB — TSH: TSH: 0.39 mIU/L — ABNORMAL LOW

## 2017-05-20 LAB — HEMOGLOBIN A1C
Hgb A1c MFr Bld: 5.9 % — ABNORMAL HIGH (ref <5.7)
Mean Plasma Glucose: 123 mg/dL

## 2017-05-20 LAB — ALT: ALT: 8 U/L (ref 6–29)

## 2017-05-20 MED ORDER — TRIAMCINOLONE ACETONIDE 0.1 % EX CREA: 1.0000 "application " | TOPICAL_CREAM | Freq: Two times a day (BID) | CUTANEOUS | 3 refills | Status: DC

## 2017-05-20 NOTE — Patient Instructions (Addendum)
Start steroid cream (triamcinolone) to apply 2 times daily for rash  Continue other medications as ordered  Follow up with Ortho as needed for joint pain  Follow up with Dr [name] as scheduled for breast cancer  Follow up in 4 mos for DM, HTn, thyroid. Fasting labs prior to appt

## 2017-05-20 NOTE — Progress Notes (Signed)
Patient ID: Karla Willis, female   DOB: Mar 24, 1935, 82 y.o.   MRN: [id number]    Location:  [facility] Place of Service: OFFICE  Chief Complaint  Patient presents with  . Medical Management of Chronic Issues    4 month Routine Visit    HPI:  82 yo female seen today for f/u. She was seen in the office last month for CP. ECG showed no acute process. She was dx and tx for costochondritis. Pain resolved since then. She c/o scratching and itching x 1 week. She noticed small bumps that have resolved. She tried OTC itching cream that helped. She had OD cats sx with IOL in Apr 2018.   She fell in her garden 05/15/17 when her support stick broke. She was a little sore the next day but no injury and she did not seek medical attention. Fall witnessed.  She has night time pain in her leg and foot on left that gets better with getting up and walking around. Tramadol helps.  DM - BS at home stable. She does not check daily.  No low BS reactions. She takes glipizide xl. She has some swelling in L>R foot. She stopped actos. Occasional  numbness or tingling. Last A1c 5.9%. LDL 74; urine microalbumin/Cr ratio 3  Leg cramps - improved. she had to stop gabapentin due to next day dizziness. She is taking tramadol prn which helps  HTN - stable on amlodipine, lisinopril HCT  Thyroid - stable on levothyroxine. TSH 0.39; T4 free 1.1  Arthritis - stable on tramadol. Takes allopurinol for gout. No recent gout attacks. She is followed by [facility] and [facility]. Last left hip injection was in Mar 2017.  Hx breast CA - followed by oncology Dr [name]. Last mammogram in Dec 2017 was benign. She takes tamoxifen and has occasional night sweats and hot flashes. No signs of recurrence  Weight loss - albumin 3.3. She gets glucerna. Appetite improved since family moved into larger home. Weight Is improved. She gained 6 lbs since Mar 2018    Past Medical History:  Diagnosis Date  . Allergy   . Arthritis   . Breast cancer  ([place]) 10/24/13   right upper inner- DCIS  . Cancer ([place])   . Diabetes mellitus without complication ([place])   . GERD (gastroesophageal reflux disease)    occ  . Gout   . History of hiatal hernia   . HOH (hard of hearing)   . Hypertension   . Hypothyroidism   . Pneumonia    hx  . Thyroid disease   . Wears glasses     Past Surgical History:  Procedure Laterality Date  . BREAST BIOPSY Right    remote, benign  . BREAST LUMPECTOMY WITH NEEDLE LOCALIZATION Right 12/16/2013   Procedure: BREAST LUMPECTOMY WITH NEEDLE LOCALIZATION;  Surgeon: Edward Jolly, MD;  Location: [place];  Service: General;  Laterality: Right;  . CHOLECYSTECTOMY  1970  . COLONOSCOPY    . FINGER SURGERY     right hand-  . gallbladder sugery    . RE-EXCISION OF BREAST LUMPECTOMY Right 01/30/2014   Procedure: RE-EXCISION OF BREAST LUMPECTOMY;  Surgeon: Edward Jolly, MD;  Location: [place];  Service: General;  Laterality: Right;  . RE-EXCISION OF BREAST LUMPECTOMY Right 03/06/2014   Procedure: RE-EXCISION OF BREAST LUMPECTOMY;  Surgeon: Edward Jolly, MD;  Location: [place];  Service: General;  Laterality: Right;  . SHOULDER ARTHROSCOPY  2011  left  . THYROID SURGERY  1970  . TOTAL SHOULDER ARTHROPLASTY Right 05/21/2016  . TOTAL SHOULDER ARTHROPLASTY Right 05/21/2016   Procedure: RIGHT TOTAL SHOULDER ARTHROPLASTY;  Surgeon: Ninetta Lights, MD;  Location: [place];  Service: Orthopedics;  Laterality: Right;  . TUBAL LIGATION      Patient Care Team: Gildardo Cranker, DO as PCP - General (Internal Medicine) Nicholas Lose, MD as Consulting Physician (Hematology and Oncology) [facility], [place] as Consulting Physician (Orthopedic Surgery)  Social History   Social History  . Marital status: Widowed    Spouse name: N/A  . Number of children: N/A  . Years of education: N/A   Occupational History  . Not on file.   Social  History Main Topics  . Smoking status: Never Smoker  . Smokeless tobacco: Never Used  . Alcohol use No  . Drug use: No  . Sexual activity: Not Currently     Comment: menarche age 32, G70, P61, first live birth age 75, menopause at 21, no HRT   Other Topics Concern  . Not on file   Social History Narrative   Diet: No      Do you drink/ eat things with caffeine? Yes      Marital status:    Widowed                           What year were you married ?  1956      Do you live in a house, apartment,assistred living, condo, trailer, etc.)?  apartment      Is it one or more stories?  one      How many persons live in your home ?  3      Do you have any pets in your home ?(please list)  No      Current or past profession:  Bank, Caregiver      Do you exercise?  No                            Type & how often:        Do you have a living will?  Yes      Do you have a DNR form?  Yes                     If not, do you want to discuss one?       Do you have signed POA?HPOA forms?                 If so, please bring to your        appointment           reports that she has never smoked. She has never used smokeless tobacco. She reports that she does not drink alcohol or use drugs.  Family History  Problem Relation Age of Onset  . Diabetes Mother   . Arthritis Father   . Seizures Sister   . Cancer Sister        breast  . Multiple sclerosis Son        Deceased    Family Status  Relation Status  . Mother Deceased at age 24  . Father Deceased  . Sister Deceased       34years old  . Sister Alive       54years old  . Son [employer]  24 years old  . Daughter Alive       31 years old  . Son Alive       73 years old  . Daughter Alive       28 years old  . Sister (Not Specified)  . Son (Not Specified)     Allergies  Allergen Reactions  . Actos [Pioglitazone] Swelling  . Hydrocodone Nausea Only and Other (See Comments)    "bloated"  . Codeine Rash  . Neurontin  [Gabapentin] Palpitations and Other (See Comments)    dizziness  . Other Rash    Nuts--PECANS  . Peanut-Containing Drug Products Rash    Medications: Patient's Medications  New Prescriptions   No medications on file  Previous Medications   ALLOPURINOL (ZYLOPRIM) 100 MG TABLET    take 1 tablet by mouth once daily for GOUT PREVENTION   AMLODIPINE (NORVASC) 5 MG TABLET    take 1 tablet by mouth once daily   FEEDING SUPPLEMENT, GLUCERNA SHAKE, (GLUCERNA SHAKE) LIQD    Take 237 mLs by mouth 2 (two) times daily between meals.   FERROUS SULFATE 325 (65 FE) MG TABLET    take 1 tablet by mouth once daily with BREAKFAST   GLIPIZIDE (GLUCOTROL XL) 5 MG 24 HR TABLET    take 1 tablet by mouth once daily with BREAKFAST   LEVOTHYROXINE (SYNTHROID, LEVOTHROID) 100 MCG TABLET    take 1 tablet by mouth once daily BEFORE BREAKFAST   LISINOPRIL (PRINIVIL,ZESTRIL) 20 MG TABLET    take 1 tablet by mouth once daily   TAMOXIFEN (NOLVADEX) 20 MG TABLET    Take 1 tablet (20 mg total) by mouth daily.   TRAMADOL (ULTRAM) 50 MG TABLET    take 1 tablet by mouth three times a day with meals if needed for MODERATE TO SEVERE PAIN  Modified Medications   No medications on file  Discontinued Medications   No medications on file    Review of Systems  Musculoskeletal: Positive for arthralgias.  Skin:       Pruritis  Neurological: Positive for numbness.  All other systems reviewed and are negative.   Vitals:   05/20/17 1356  BP: 132/70  Pulse: 79  Temp: 98.6 F (37 C)  TempSrc: Oral  SpO2: 98%  Weight: 242 lb 12.8 oz (110.1 kg)  Height: _0  (1.575 m)   Body mass index is 44.41 kg/m.  Physical Exam  Constitutional: She is oriented to person, place, and time. She appears well-developed and well-nourished. No distress.  HENT:  Head: Normocephalic.  Mouth/Throat: Oropharynx is clear and moist and mucous membranes are normal. She has dentures. No oropharyngeal exudate.  Eyes: Lids are normal. Pupils are  equal, round, and reactive to light. No scleral icterus.  Neck: Trachea normal. Neck supple. Carotid bruit is not present. Thyromegaly (mild) present. No thyroid mass present.  Cardiovascular: Normal rate, regular rhythm and intact distal pulses.  Exam reveals no gallop and no friction rub.   Murmur (2/6 SEM) heard. +2 pitting LE edema. No calf TTP  Pulmonary/Chest: Effort normal and breath sounds normal. No stridor. No respiratory distress. She has no wheezes. She has no rhonchi. She has no rales. She exhibits no tenderness.  Abdominal: Soft. Normal appearance, normal aorta and bowel sounds are normal. She exhibits no distension. There is no hepatomegaly. There is no tenderness.  Musculoskeletal: She exhibits edema. She exhibits no tenderness.  Lymphadenopathy:    She has no cervical adenopathy.  Neurological: She is alert  and oriented to person, place, and time. She has normal strength. No cranial nerve deficit. Gait normal.  Skin: Skin is warm, dry and intact. Rash (right arm blacheable patchy rash, linear) noted. Nails show no clubbing.  Egg sized freely mobile lipoma at sternoclavicular notch  Psychiatric: She has a normal mood and affect. Her speech is normal and behavior is normal. Judgment and thought content normal. Cognition and memory are normal.   Diabetic Foot Exam - Simple   Simple Foot Form Diabetic Foot exam was performed with the following findings:  Yes 05/20/2017  2:41 PM  Visual Inspection See comments:  Yes Sensation Testing Intact to touch and monofilament testing bilaterally:  Yes Pulse Check Posterior Tibialis and Dorsalis pulse intact bilaterally:  Yes Comments B/l bunion but no calluses or ulcerations      Labs reviewed: Appointment on 05/19/2017  Component Date Value Ref Range Status  . Sodium 05/19/2017 143  135 - 146 mmol/L Final  . Potassium 05/19/2017 4.8  3.5 - 5.3 mmol/L Final  . Chloride 05/19/2017 109  98 - 110 mmol/L Final  . CO2 05/19/2017 23   20 - 31 mmol/L Final  . Glucose, Bld 05/19/2017 105* 65 - 99 mg/dL Final  . BUN 05/19/2017 19  7 - 25 mg/dL Final  . Creat 05/19/2017 1.03* 0.60 - 0.93 mg/dL Final   Comment:   For patients > or = 80 years of age: The upper reference limit for Creatinine is approximately 13% higher for people identified as African-American.     . Calcium 05/19/2017 9.4  8.6 - 10.4 mg/dL Final  . ALT 05/19/2017 8  6 - 29 U/L Final  . Cholesterol 05/19/2017 154  <200 mg/dL Final  . Triglycerides 05/19/2017 98  <150 mg/dL Final  . HDL 05/19/2017 60  >50 mg/dL Final  . Total CHOL/HDL Ratio 05/19/2017 2.6  <5.0 Ratio Final  . VLDL 05/19/2017 20  <30 mg/dL Final  . LDL Cholesterol 05/19/2017 74  <100 mg/dL Final  . Hgb A1c MFr Bld 05/19/2017 5.9* <5.7 % Final   Comment:   For someone without known diabetes, a hemoglobin A1c value between 5.7% and 6.4% is consistent with prediabetes and should be confirmed with a follow-up test.   For someone with known diabetes, a value <7% indicates that their diabetes is well controlled. A1c targets should be individualized based on duration of diabetes, age, co-morbid conditions and other considerations.   This assay result is consistent with an increased risk of diabetes.   Currently, no consensus exists regarding use of hemoglobin A1c for diagnosis of diabetes in children.     . Mean Plasma Glucose 05/19/2017 123  mg/dL Final  . TSH 05/19/2017 0.39* mIU/L Final   Comment:   Reference Range   > or = 20 Years  0.40-4.50   Pregnancy Range First trimester  0.26-2.66 Second trimester 0.55-2.73 Third trimester  0.43-2.91       No results found.   Assessment/Plan   ICD-10-CM   1. Plant dermatitis L25.5 triamcinolone cream (KENALOG) 0.1 %  2. Type 2 diabetes mellitus with stage 2 chronic kidney disease, without long-term current use of insulin (HCC) E11.22 CMP with eGFR   N18.2 Hemoglobin A1c  3. Essential hypertension, benign I10   4. Hypothyroidism  due to acquired atrophy of thyroid E03.4 TSH    T4, Free  5. Primary osteoarthritis involving multiple joints M15.0   6. Bilateral edema of lower extremity R60.0   7. Hyperlipidemia LDL goal <100 E78.5  Lipid Panel  8. Idiopathic gout, unspecified chronicity, unspecified site M10.00   9. History of breast cancer Z85.3    Start steroid cream (triamcinolone) to apply 2 times daily for rash  Continue other medications as ordered  Follow up with Ortho as needed for joint pain  Follow up with Dr [name] as scheduled for breast cancer  Follow up in 4 mos for DM, HTn, thyroid. Fasting labs prior to appt   [place] S. Perlie Gold  [facility] [address] [place], [place] [address] [phone] Cell (Monday-Friday 8 AM - 5 PM) [phone] After 5 PM and follow prompts

## 2017-05-26 ENCOUNTER — Other Ambulatory Visit: Payer: Self-pay | Admitting: Nurse Practitioner

## 2017-06-26 ENCOUNTER — Other Ambulatory Visit: Payer: Self-pay | Admitting: Internal Medicine

## 2017-06-26 IMAGING — US US RENAL
1 series · 14 of 25 positions shown · non-contrast
Comparison: None.

CLINICAL DATA: Acute renal failure. Hypertension, diabetes. History
of breast cancer.

EXAM:
RENAL / URINARY TRACT ULTRASOUND COMPLETE

[Series 1: us renal · 0.22mm/px · 14 of 30 slices shown]
[im 1/30]
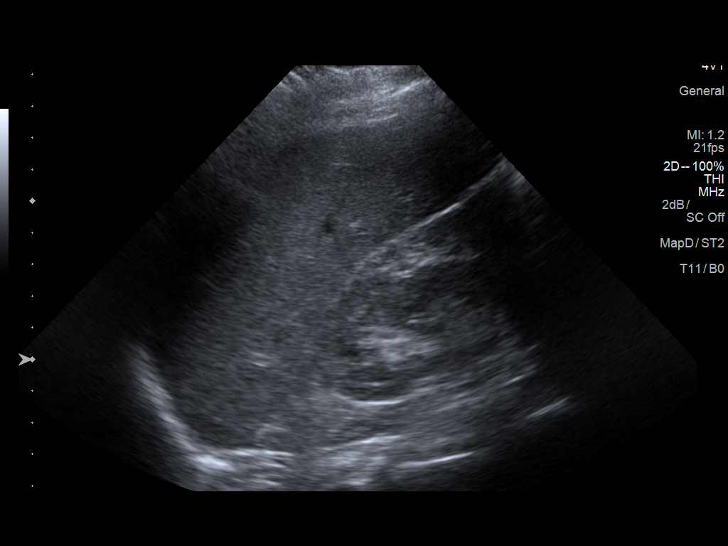
[im 3/30]
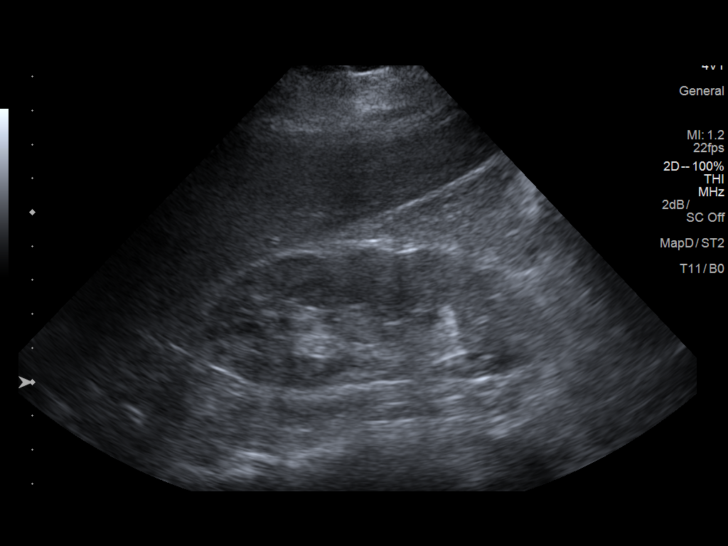
[im 5/30]
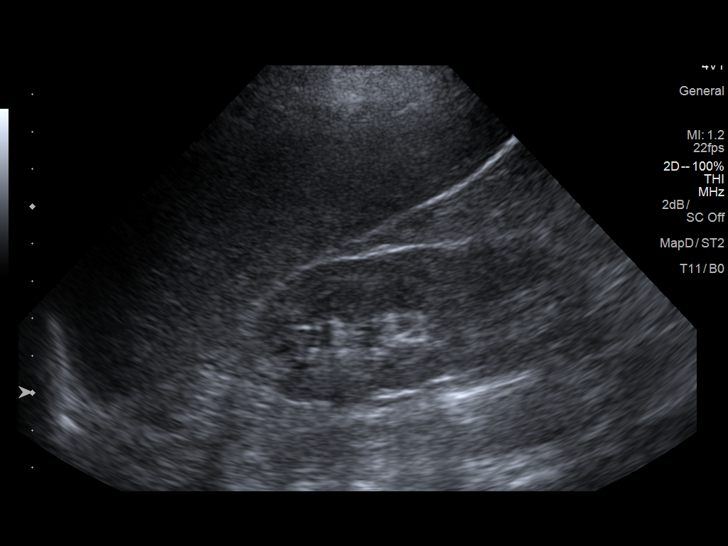
[im 8/30]
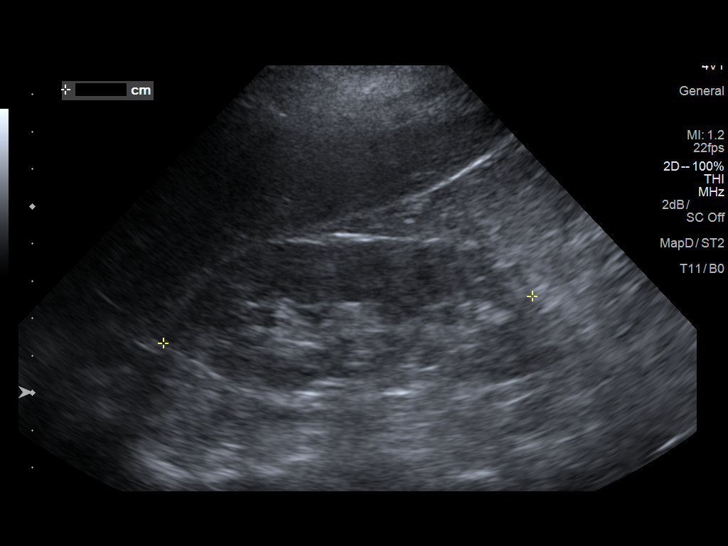
[im 10/30]
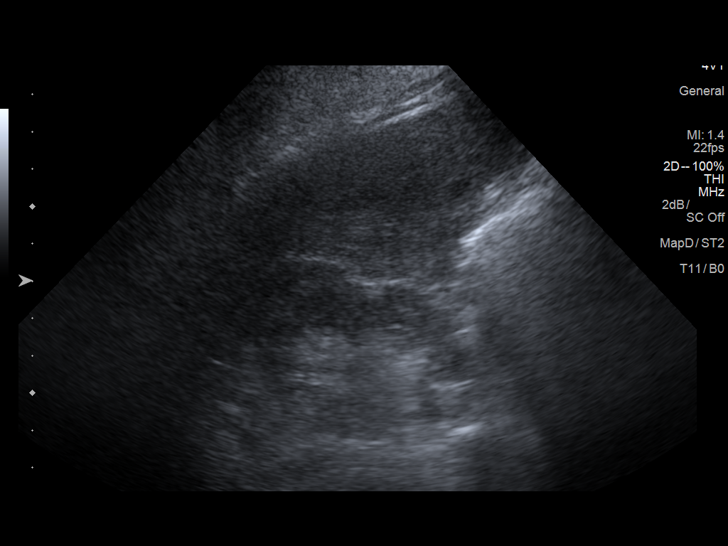
[im 11/30]
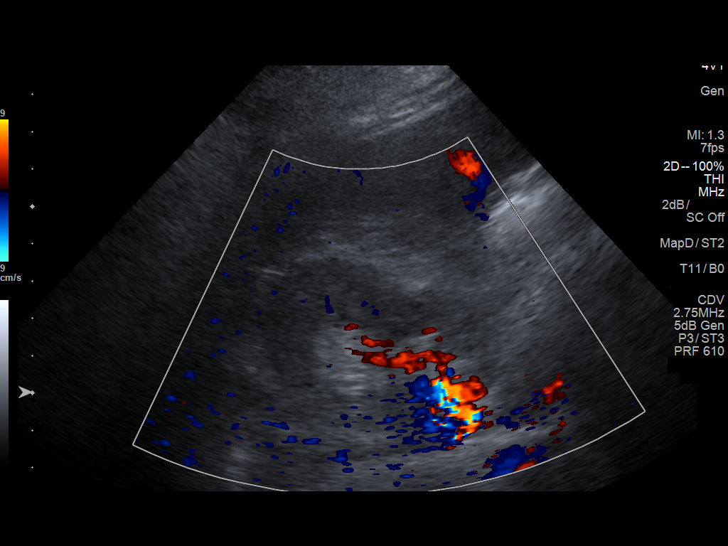
[im 14/30]
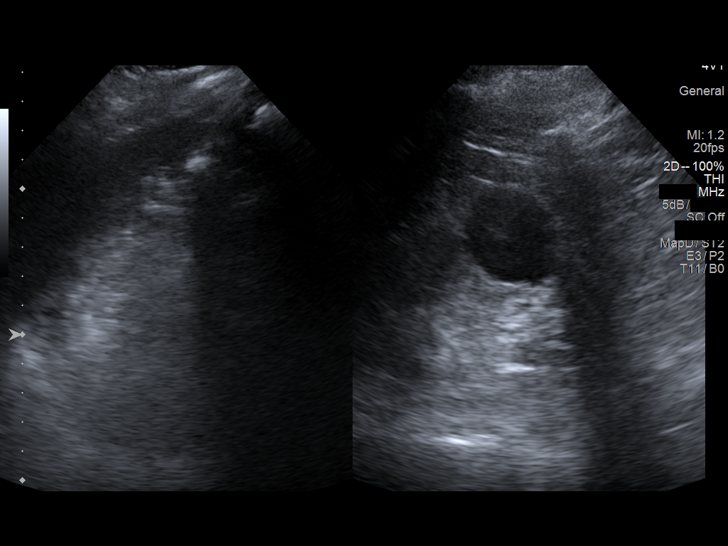
[im 16/30]
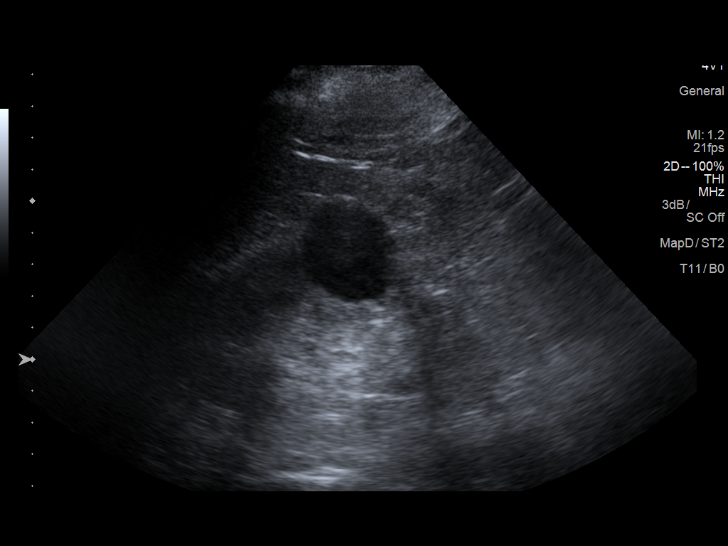
[im 19/30]
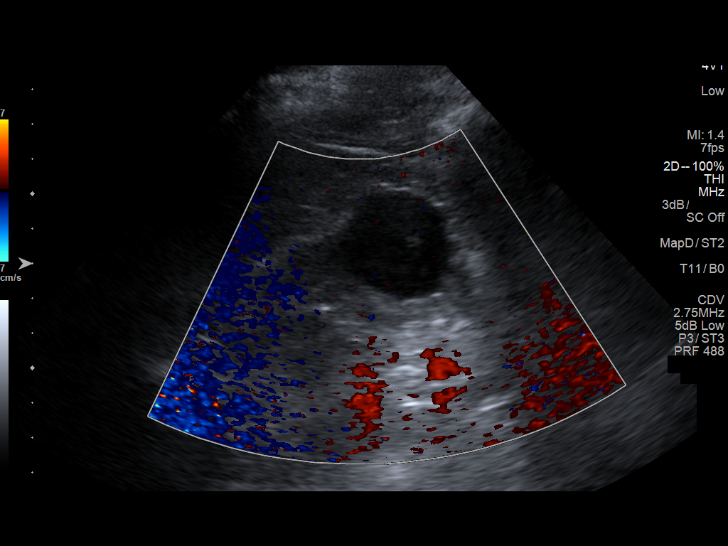
[im 20/30]
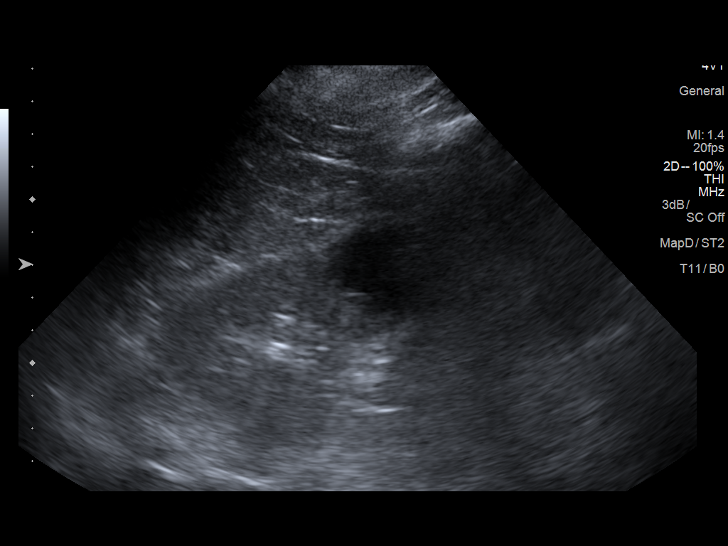
[im 22/30]
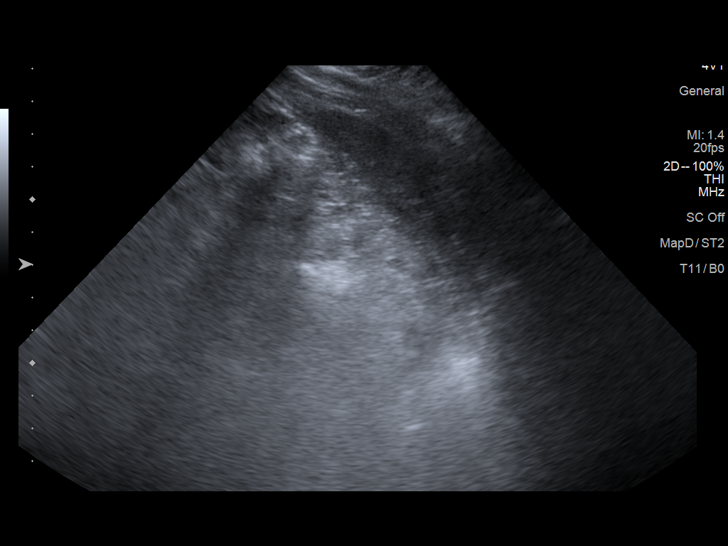
[im 25/30]
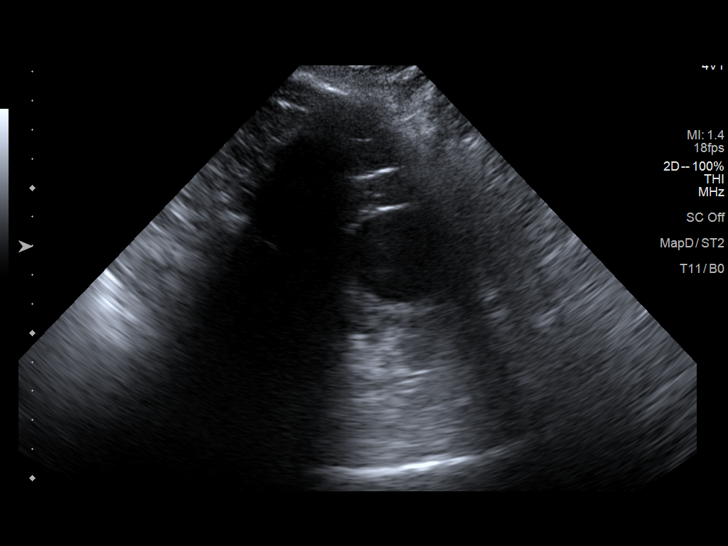
[im 27/30]
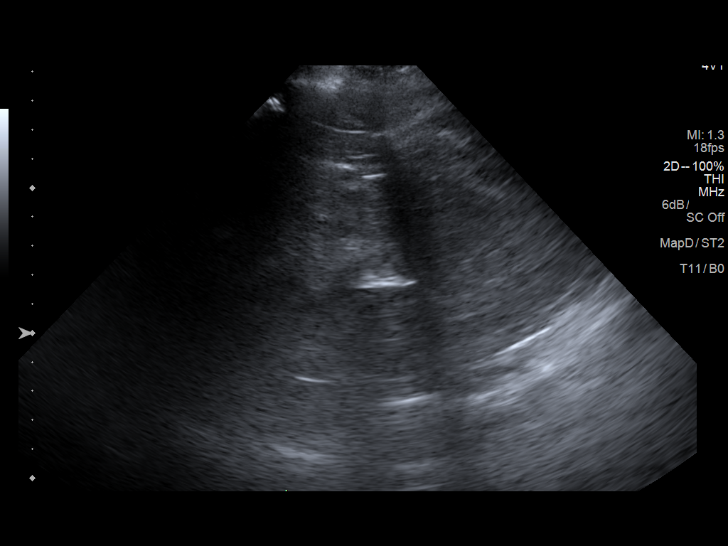
[im 30/30]
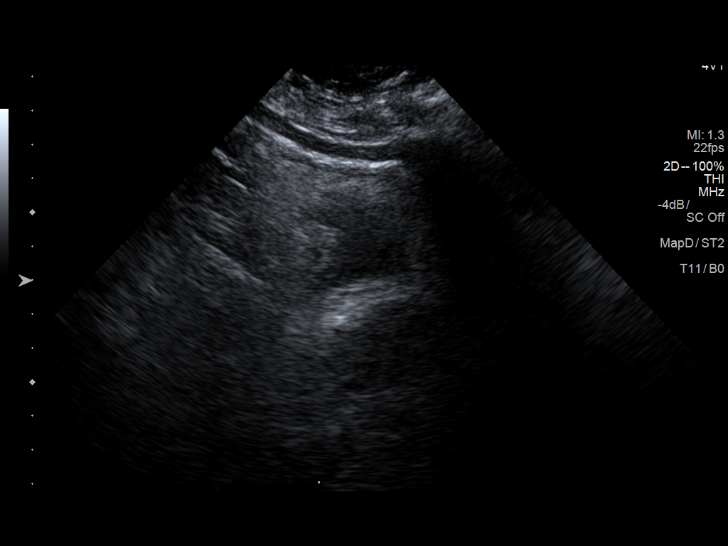

[14 of 25 positions shown; findings below may reference images not displayed]

FINDINGS: Right Kidney:

Length: 10.0 cm.  Renal echogenicity is normal.  No hydronephrosis.

Left Kidney:

Length: 10.4 cm. Normal renal parenchymal edge echogenicity. No
hydronephrosis. Midpole cyst is 3.3 x 3.6 x 3.6 cm. Lower pole is
difficult to evaluate because of overlying bowel gas.

Bladder:

Bladder is decompressed.

Additional:

Study quality is degraded by patient body habitus.
IMPRESSION: 1. No hydronephrosis or suspicious renal mass.
2. Left renal cyst.

## 2017-07-18 ENCOUNTER — Other Ambulatory Visit: Payer: Self-pay | Admitting: Internal Medicine

## 2017-09-17 ENCOUNTER — Other Ambulatory Visit: Payer: Medicare Other

## 2017-09-17 DIAGNOSIS — E034 Atrophy of thyroid (acquired): Secondary | ICD-10-CM | POA: Diagnosis not present

## 2017-09-17 DIAGNOSIS — E1122 Type 2 diabetes mellitus with diabetic chronic kidney disease: Secondary | ICD-10-CM | POA: Diagnosis not present

## 2017-09-17 DIAGNOSIS — N182 Chronic kidney disease, stage 2 (mild): Secondary | ICD-10-CM

## 2017-09-17 DIAGNOSIS — E785 Hyperlipidemia, unspecified: Secondary | ICD-10-CM | POA: Diagnosis not present

## 2017-09-18 LAB — LIPID PANEL
Cholesterol: 142 mg/dL (ref <200)
HDL: 57 mg/dL (ref >50)
LDL Cholesterol (Calc): 65 mg/dL (calc)
Non-HDL Cholesterol (Calc): 85 mg/dL (calc) (ref <130)
Total CHOL/HDL Ratio: 2.5 (calc) (ref <5.0)
Triglycerides: 121 mg/dL (ref <150)

## 2017-09-18 LAB — COMPLETE METABOLIC PANEL WITH GFR
AG Ratio: 1.3 (calc) (ref 1.0–2.5)
ALT: 6 U/L (ref 6–29)
AST: 13 U/L (ref 10–35)
Albumin: 3.5 g/dL — ABNORMAL LOW (ref 3.6–5.1)
Alkaline phosphatase (APISO): 61 U/L (ref 33–130)
BUN/Creatinine Ratio: 16 (calc) (ref 6–22)
BUN: 17 mg/dL (ref 7–25)
CO2: 25 mmol/L (ref 20–32)
Calcium: 8.9 mg/dL (ref 8.6–10.4)
Chloride: 108 mmol/L (ref 98–110)
Creat: 1.04 mg/dL — ABNORMAL HIGH (ref 0.60–0.93)
GFR, Est African American: 59 mL/min/{1.73_m2} — ABNORMAL LOW (ref >=60)
GFR, Est Non African American: 51 mL/min/{1.73_m2} — ABNORMAL LOW (ref >=60)
Globulin: 2.7 g/dL (calc) (ref 1.9–3.7)
Glucose, Bld: 94 mg/dL (ref 65–99)
Potassium: 4.4 mmol/L (ref 3.5–5.3)
Sodium: 140 mmol/L (ref 135–146)
Total Bilirubin: 0.4 mg/dL (ref 0.2–1.2)
Total Protein: 6.2 g/dL (ref 6.1–8.1)

## 2017-09-18 LAB — T4, FREE: Free T4: 1.3 ng/dL (ref 0.8–1.8)

## 2017-09-18 LAB — HEMOGLOBIN A1C
Hgb A1c MFr Bld: 5.9 % of total Hgb — ABNORMAL HIGH (ref <5.7)
Mean Plasma Glucose: 123 (calc)
eAG (mmol/L): 6.8 (calc)

## 2017-09-18 LAB — TSH: TSH: 1.46 mIU/L (ref 0.40–4.50)

## 2017-09-22 ENCOUNTER — Encounter: Payer: Self-pay | Admitting: Internal Medicine

## 2017-09-22 ENCOUNTER — Ambulatory Visit (INDEPENDENT_AMBULATORY_CARE_PROVIDER_SITE_OTHER): Payer: Medicare Other | Admitting: Internal Medicine

## 2017-09-22 VITALS — BP 132/68 | HR 72 | Temp 98.5°F | Resp 16 | Ht 62.0 in | Wt 245.2 lb

## 2017-09-22 DIAGNOSIS — I10 Essential (primary) hypertension: Secondary | ICD-10-CM | POA: Diagnosis not present

## 2017-09-22 DIAGNOSIS — M150 Primary generalized (osteo)arthritis: Secondary | ICD-10-CM

## 2017-09-22 DIAGNOSIS — Z23 Encounter for immunization: Secondary | ICD-10-CM | POA: Diagnosis not present

## 2017-09-22 DIAGNOSIS — E034 Atrophy of thyroid (acquired): Secondary | ICD-10-CM

## 2017-09-22 DIAGNOSIS — N182 Chronic kidney disease, stage 2 (mild): Secondary | ICD-10-CM | POA: Diagnosis not present

## 2017-09-22 DIAGNOSIS — E785 Hyperlipidemia, unspecified: Secondary | ICD-10-CM | POA: Diagnosis not present

## 2017-09-22 DIAGNOSIS — E1122 Type 2 diabetes mellitus with diabetic chronic kidney disease: Secondary | ICD-10-CM | POA: Diagnosis not present

## 2017-09-22 DIAGNOSIS — M159 Polyosteoarthritis, unspecified: Secondary | ICD-10-CM

## 2017-09-22 DIAGNOSIS — R600 Localized edema: Secondary | ICD-10-CM | POA: Diagnosis not present

## 2017-09-22 DIAGNOSIS — M8949 Other hypertrophic osteoarthropathy, multiple sites: Secondary | ICD-10-CM

## 2017-09-22 DIAGNOSIS — M1000 Idiopathic gout, unspecified site: Secondary | ICD-10-CM | POA: Diagnosis not present

## 2017-09-22 DIAGNOSIS — J3089 Other allergic rhinitis: Secondary | ICD-10-CM | POA: Diagnosis not present

## 2017-09-22 NOTE — Patient Instructions (Addendum)
TAKE OTC PLAIN CLARITIN, ALLEGRA OR ZYRTEC DAILY for seasonal allergy  Continue other medications as ordered  Influenza vaccine given today  Follow up in 4 mos AWV/CPE. Fasting labs prior to appt

## 2017-09-22 NOTE — Progress Notes (Signed)
Patient ID: Karla Willis, female   DOB: April 10, 1935, 82 y.o.   MRN: [id number]    Location:  [facility] Place of Service: OFFICE  Chief Complaint  Patient presents with  . Medical Management of Chronic Issues    4 month follow up for DM, HTN and thyroid. Patient states that she's been sneezing alot and her throat feels sore.    . Medication Refill    No refills needed at this time.   Marland Kitchen Results    [facility] labs  . Immunizations    Patient interested in the flu vaccine    HPI:  82 yo female seen today for f/u. She has 3 day hx seasonal allergy sx's with associated sore throat. She has not taken anything OTC. No f/c. No cough.  She fell in the bathtub last week - no injury but she felt sore for 2 days and that resolved. Fall unwitnessed.  She is planning to move to an apt with her 23 yr old granddaughter this month.  DM - BS at home stable. She does not check daily.  No low BS reactions. She takes glipizide xl. She has some swelling in L>R foot. She stopped actos. Occasional  numbness or tingling. A1c 5.9%. LDL 65; urine microalbumin/Cr ratio 3  Leg cramps - improved. she had to stop gabapentin due to next day dizziness. She is taking tramadol prn which helps  HTN - stable on amlodipine, lisinopril HCT  Thyroid - stable on levothyroxine. TSH 1.46; T4 free 1.3  Arthritis - stable on tramadol. Takes allopurinol for gout. No recent gout attacks. She is followed by [facility] and [facility]. Last left hip injection was in Mar 2017.  Hx breast CA - followed by oncology Dr [name]. Last mammogram in Dec 2017 was benign. She takes tamoxifen and has occasional night sweats and hot flashes. No signs of recurrence  Weight loss - improved. albumin 3.5. She gets glucerna. Appetite improved since family moved into larger home. Weight up 3 lbs since July 2018 (total 11 lbs since Dec 2017).    Past Medical History:  Diagnosis Date  . Allergy   . Arthritis   . Breast cancer ([place]) 10/24/13   right  upper inner- DCIS  . Cancer ([place])   . Diabetes mellitus without complication ([place])   . GERD (gastroesophageal reflux disease)    occ  . Gout   . History of hiatal hernia   . HOH (hard of hearing)   . Hypertension   . Hypothyroidism   . Pneumonia    hx  . Thyroid disease   . Wears glasses     Past Surgical History:  Procedure Laterality Date  . BREAST BIOPSY Right    remote, benign  . CHOLECYSTECTOMY  1970  . COLONOSCOPY    . FINGER SURGERY     right hand-  . gallbladder sugery    . SHOULDER ARTHROSCOPY  2011   left  . THYROID SURGERY  1970  . TOTAL SHOULDER ARTHROPLASTY Right 05/21/2016  . TUBAL LIGATION      Patient Care Team: Gildardo Cranker, DO as PCP - General (Internal Medicine) Nicholas Lose, MD as Consulting Physician (Hematology and Oncology) [facility] [place] as Consulting Physician (Orthopedic Surgery)  Social History   Socioeconomic History  . Marital status: Widowed    Spouse name: Not on file  . Number of children: Not on file  . Years of education: Not on file  . Highest education level: Not on file  Social Needs  . Financial resource strain: Not on file  . Food insecurity - worry: Not on file  . Food insecurity - inability: Not on file  . Transportation needs - medical: Not on file  . Transportation needs - non-medical: Not on file  Occupational History  . Not on file  Tobacco Use  . Smoking status: Never Smoker  . Smokeless tobacco: Never Used  Substance and Sexual Activity  . Alcohol use: No    Alcohol/week: 0.0 oz  . Drug use: No  . Sexual activity: Not Currently    Comment: menarche age 89, G46, P75, first live birth age 71, menopause at 50, no HRT  Other Topics Concern  . Not on file  Social History Narrative   Diet: No      Do you drink/ eat things with caffeine? Yes      Marital status:    Widowed                           What year were you married ?  1956      Do you live in a house, apartment,assistred  living, condo, trailer, etc.)?  apartment      Is it one or more stories?  one      How many persons live in your home ?  3      Do you have any pets in your home ?(please list)  No      Current or past profession:  Bank, Caregiver      Do you exercise?  No                            Type & how often:        Do you have a living will?  Yes      Do you have a DNR form?  Yes                     If not, do you want to discuss one?       Do you have signed POA?HPOA forms?                 If so, please bring to your        appointment        reports that  has never smoked. she has never used smokeless tobacco. She reports that she does not drink alcohol or use drugs.  Family History  Problem Relation Age of Onset  . Diabetes Mother   . Arthritis Father   . Seizures Sister   . Cancer Sister        breast  . Multiple sclerosis Son        Deceased    Family Status  Relation Name Status  . Mother ella Deceased at age 62  . Father julius Deceased  . Sister patricia Deceased       45years old  . Sister Bluford Kaufmann       59years old  . Son Gwyndolyn Saxon Alive       75 years old  . Daughter ella Alive       69 years old  . Son Cotati Alive       76 years old  . Daughter debra Alive       74 years old  . Sister patrice (Not Specified)  . Son Gwyndolyn Saxon (Not Specified)  Allergies  Allergen Reactions  . Actos [Pioglitazone] Swelling  . Hydrocodone Nausea Only and Other (See Comments)    "bloated"  . Codeine Rash  . Neurontin [Gabapentin] Palpitations and Other (See Comments)    dizziness  . Other Rash    Nuts--PECANS  . Peanut-Containing Drug Products Rash    Medications:   Medication List        Accurate as of 09/22/17 10:47 AM. Always use your most recent med list.          allopurinol 100 MG tablet Commonly known as:  ZYLOPRIM take 1 tablet by mouth once daily for GOUT PREVENTION   amLODipine 5 MG tablet Commonly known as:  NORVASC take 1 tablet by  mouth once daily   feeding supplement (GLUCERNA SHAKE) Liqd Take 237 mLs by mouth 2 (two) times daily between meals.   ferrous sulfate 325 (65 FE) MG tablet take 1 tablet by mouth once daily with BREAKFAST   glipiZIDE 5 MG 24 hr tablet Commonly known as:  GLUCOTROL XL take 1 tablet by mouth once daily with BREAKFAST   levothyroxine 100 MCG tablet Commonly known as:  SYNTHROID, LEVOTHROID take 1 tablet by mouth once daily BEFORE BREAKFAST   lisinopril 20 MG tablet Commonly known as:  PRINIVIL,ZESTRIL take 1 tablet by mouth once daily   tamoxifen 20 MG tablet Commonly known as:  NOLVADEX Take 1 tablet (20 mg total) by mouth daily.   traMADol 50 MG tablet Commonly known as:  ULTRAM TAKE 1 TABLET BY MOUTH THREE TIMES DAILY WITH MEALS IF NEEDED FOR MODERATE TO SEVERE PAIN   triamcinolone cream 0.1 % Commonly known as:  KENALOG Apply 1 application topically 2 (two) times daily.       Review of Systems  HENT: Positive for sore throat.   Musculoskeletal: Positive for arthralgias and gait problem.  All other systems reviewed and are negative.   Vitals:   09/22/17 1037  BP: 132/68  Pulse: 72  Resp: 16  Temp: 98.5 F (36.9 C)  TempSrc: Oral  SpO2: 96%  Weight: 245 lb 3.2 oz (111.2 kg)  Height: '5\' 2"'  (1.575 m)   Body mass index is 44.85 kg/m.  Physical Exam  Constitutional: She is oriented to person, place, and time. She appears well-developed and well-nourished.  HENT:  Mouth/Throat: Oropharynx is clear and moist. No oropharyngeal exudate.  TMs appear intact, no redness. No sinus TTP. Oropharynx cobblestoning but no redness or exudate.   Eyes: Pupils are equal, round, and reactive to light. No scleral icterus.  Neck: Neck supple. Carotid bruit is not present. No tracheal deviation present. No thyromegaly present.  Cardiovascular: Normal rate, regular rhythm and intact distal pulses. Exam reveals no gallop and no friction rub.  Murmur (1/6 SEM) heard. No LE edema  b/l. no calf TTP.   Pulmonary/Chest: Effort normal and breath sounds normal. No stridor. No respiratory distress. She has no wheezes. She has no rales.  Abdominal: Soft. Normal appearance and bowel sounds are normal. She exhibits no distension and no mass. There is no hepatomegaly. There is no tenderness. There is no rigidity, no rebound and no guarding. No hernia.  Musculoskeletal: She exhibits edema and tenderness.  Lymphadenopathy:    She has no cervical adenopathy.  Neurological: She is alert and oriented to person, place, and time.  Skin: Skin is warm and dry. No rash noted.  Supraclavicular freely mobile lipoma, grape-sized  Psychiatric: She has a normal mood and affect. Her behavior is normal. Judgment and  thought content normal.     Labs reviewed: Appointment on 09/17/2017  Component Date Value Ref Range Status  . Hgb A1c MFr Bld 09/17/2017 5.9* <5.7 % of total Hgb Final   Comment: For someone without known diabetes, a hemoglobin  A1c value between 5.7% and 6.4% is consistent with prediabetes and should be confirmed with a  follow-up test. . For someone with known diabetes, a value <7% indicates that their diabetes is well controlled. A1c targets should be individualized based on duration of diabetes, age, comorbid conditions, and other considerations. . This assay result is consistent with an increased risk of diabetes. . Currently, no consensus exists regarding use of hemoglobin A1c for diagnosis of diabetes for children. .   . Mean Plasma Glucose 09/17/2017 123  (calc) Final  . eAG (mmol/L) 09/17/2017 6.8  (calc) Final  . Free T4 09/17/2017 1.3  0.8 - 1.8 ng/dL Final  . TSH 09/17/2017 1.46  0.40 - 4.50 mIU/L Final  . Cholesterol 09/17/2017 142  <200 mg/dL Final  . HDL 09/17/2017 57  >50 mg/dL Final  . Triglycerides 09/17/2017 121  <150 mg/dL Final  . LDL Cholesterol (Calc) 09/17/2017 65  mg/dL (calc) Final   Comment: Reference range: <100 . Desirable range <100  mg/dL for primary prevention;   <70 mg/dL for patients with CHD or diabetic patients  with > or = 2 CHD risk factors. Marland Kitchen LDL-C is now calculated using the Martin-Hopkins  calculation, which is a validated novel method providing  better accuracy than the Friedewald equation in the  estimation of LDL-C.  Cresenciano Genre et al. Annamaria Helling. 0881;103(15): 2061-2068  (http://education.[facility].com/faq/FAQ164)   . Total CHOL/HDL Ratio 09/17/2017 2.5  <5.0 (calc) Final  . Non-HDL Cholesterol (Calc) 09/17/2017 85  <130 mg/dL (calc) Final   Comment: For patients with diabetes plus 1 major ASCVD risk  factor, treating to a non-HDL-C goal of <100 mg/dL  (LDL-C of <70 mg/dL) is considered a therapeutic  option.   . Glucose, Bld 09/17/2017 94  65 - 99 mg/dL Final   Comment: .            Fasting reference interval .   . BUN 09/17/2017 17  7 - 25 mg/dL Final  . Creat 09/17/2017 1.04* 0.60 - 0.93 mg/dL Final   Comment: For patients >47 years of age, the reference limit for Creatinine is approximately 13% higher for people identified as African-American. .   . GFR, Est Non African American 09/17/2017 51* > OR = 60 mL/min/1.56m Final  . GFR, Est African American 09/17/2017 59* > OR = 60 mL/min/1.72mFinal  . BUN/Creatinine Ratio 09/17/2017 16  6 - 22 (calc) Final  . Sodium 09/17/2017 140  135 - 146 mmol/L Final  . Potassium 09/17/2017 4.4  3.5 - 5.3 mmol/L Final  . Chloride 09/17/2017 108  98 - 110 mmol/L Final  . CO2 09/17/2017 25  20 - 32 mmol/L Final  . Calcium 09/17/2017 8.9  8.6 - 10.4 mg/dL Final  . Total Protein 09/17/2017 6.2  6.1 - 8.1 g/dL Final  . Albumin 09/17/2017 3.5* 3.6 - 5.1 g/dL Final  . Globulin 09/17/2017 2.7  1.9 - 3.7 g/dL (calc) Final  . AG Ratio 09/17/2017 1.3  1.0 - 2.5 (calc) Final  . Total Bilirubin 09/17/2017 0.4  0.2 - 1.2 mg/dL Final  . Alkaline phosphatase (APISO) 09/17/2017 61  33 - 130 U/L Final  . AST 09/17/2017 13  10 - 35 U/L Final  . ALT 09/17/2017 6  6 -  29 U/L Final    No results found.   Assessment/Plan   ICD-10-CM   1. Seasonal allergic rhinitis due to other allergic trigger J30.89   2. Primary osteoarthritis involving multiple joints M15.0   3. Hypothyroidism due to acquired atrophy of thyroid E03.4 TSH  4. Type 2 diabetes mellitus with stage 2 chronic kidney disease, without long-term current use of insulin (HCC) E11.22 CMP with eGFR   N18.2 Hemoglobin A1c    Microalbumin/Creatinine Ratio, Urine  5. Essential hypertension, benign I10 CBC with Differential/Platelets  6. Bilateral edema of lower extremity R60.0   7. Hyperlipidemia LDL goal <100 E78.5 Lipid Panel  8. Idiopathic gout, unspecified chronicity, unspecified site M10.00 Uric Acid  9. Need for immunization against influenza Z23 Flu Vaccine QUAD 36+ mos IM   TAKE OTC PLAIN CLARITIN, ALLEGRA OR ZYRTEC DAILY for seasonal allergy  Continue other medications as ordered  Influenza vaccine given today  Follow up in 4 mos AWV/CPE. Fasting labs prior to appt  [place] S. Perlie Gold  [facility] [address] [place], [place] [address] [phone] Cell (Monday-Friday 8 AM - 5 PM) [phone] After 5 PM and follow prompts

## 2017-10-11 ENCOUNTER — Other Ambulatory Visit: Payer: Self-pay | Admitting: Internal Medicine

## 2017-10-11 DIAGNOSIS — E119 Type 2 diabetes mellitus without complications: Secondary | ICD-10-CM

## 2017-10-29 ENCOUNTER — Other Ambulatory Visit: Payer: Self-pay | Admitting: Internal Medicine

## 2017-10-29 ENCOUNTER — Encounter: Payer: Self-pay | Admitting: Internal Medicine

## 2017-10-30 NOTE — Telephone Encounter (Signed)
[place] Database verified and compliance confirmed   

## 2017-11-02 ENCOUNTER — Other Ambulatory Visit: Payer: Self-pay | Admitting: Internal Medicine

## 2017-11-10 HISTORY — PX: CATARACT EXTRACTION W/ INTRAOCULAR LENS IMPLANT: SHX1309

## 2017-12-03 ENCOUNTER — Other Ambulatory Visit: Payer: Self-pay | Admitting: Internal Medicine

## 2017-12-10 DIAGNOSIS — Z961 Presence of intraocular lens: Secondary | ICD-10-CM | POA: Diagnosis not present

## 2017-12-10 DIAGNOSIS — H25812 Combined forms of age-related cataract, left eye: Secondary | ICD-10-CM | POA: Diagnosis not present

## 2017-12-10 DIAGNOSIS — E119 Type 2 diabetes mellitus without complications: Secondary | ICD-10-CM | POA: Diagnosis not present

## 2017-12-24 ENCOUNTER — Other Ambulatory Visit: Payer: Self-pay | Admitting: Internal Medicine

## 2017-12-24 DIAGNOSIS — Z9889 Other specified postprocedural states: Secondary | ICD-10-CM

## 2017-12-25 DIAGNOSIS — H2512 Age-related nuclear cataract, left eye: Secondary | ICD-10-CM | POA: Diagnosis not present

## 2017-12-30 DIAGNOSIS — H2512 Age-related nuclear cataract, left eye: Secondary | ICD-10-CM | POA: Diagnosis not present

## 2017-12-30 DIAGNOSIS — H25812 Combined forms of age-related cataract, left eye: Secondary | ICD-10-CM | POA: Diagnosis not present

## 2017-12-30 HISTORY — PX: CATARACT EXTRACTION: SUR2

## 2018-01-04 ENCOUNTER — Other Ambulatory Visit: Payer: Self-pay | Admitting: Internal Medicine

## 2018-01-07 ENCOUNTER — Ambulatory Visit (INDEPENDENT_AMBULATORY_CARE_PROVIDER_SITE_OTHER): Payer: Medicare Other

## 2018-01-07 VITALS — BP 138/80 | HR 88 | Temp 98.6°F | Ht 62.0 in | Wt 241.0 lb

## 2018-01-07 DIAGNOSIS — Z0000 Encounter for general adult medical examination without abnormal findings: Secondary | ICD-10-CM

## 2018-01-07 MED ORDER — ZOSTER VAC RECOMB ADJUVANTED 50 MCG/0.5ML IM SUSR: 0.5000 mL | Freq: Once | INTRAMUSCULAR | 1 refills | Status: AC

## 2018-01-07 NOTE — Patient Instructions (Signed)
Karla Willis , Thank you for taking time to come for your Medicare Wellness Visit. I appreciate your ongoing commitment to your health goals. Please review the following plan we discussed and let me know if I can assist you in the future.   Screening recommendations/referrals: Colonoscopy excluded, you are over age 81 Mammogram excluded, you are over age 7 Bone Density up to date Recommended yearly ophthalmology/optometry visit for glaucoma screening and checkup Recommended yearly dental visit for hygiene and checkup  Vaccinations: Influenza vaccine up to date, due 2019 fall season Pneumococcal vaccine up to date Tdap vaccine up to date, due 04/18/2026 Shingles vaccine due, prescription sent to pharmacy    Advanced directives: in chart  Conditions/risks identified: none  Next appointment: Dr. Eulas Post 01/22/2018 @ 12:30pm             Karla Dense, RN 01/10/2019 @ 2:30pm   Preventive Care 65 Years and Older, Female Preventive care refers to lifestyle choices and visits with your health care provider that can promote health and wellness. What does preventive care include?  A yearly physical exam. This is also called an annual well check.  Dental exams once or twice a year.  Routine eye exams. Ask your health care provider how often you should have your eyes checked.  Personal lifestyle choices, including:  Daily care of your teeth and gums.  Regular physical activity.  Eating a healthy diet.  Avoiding tobacco and drug use.  Limiting alcohol use.  Practicing safe sex.  Taking low-dose aspirin every day.  Taking vitamin and mineral supplements as recommended by your health care provider. What happens during an annual well check? The services and screenings done by your health care provider during your annual well check will depend on your age, overall health, lifestyle risk factors, and family history of disease. Counseling  Your health care provider may ask you questions  about your:  Alcohol use.  Tobacco use.  Drug use.  Emotional well-being.  Home and relationship well-being.  Sexual activity.  Eating habits.  History of falls.  Memory and ability to understand (cognition).  Work and work Statistician.  Reproductive health. Screening  You may have the following tests or measurements:  Height, weight, and BMI.  Blood pressure.  Lipid and cholesterol levels. These may be checked every 5 years, or more frequently if you are over 44 years old.  Skin check.  Lung cancer screening. You may have this screening every year starting at age 39 if you have a 30-pack-year history of smoking and currently smoke or have quit within the past 15 years.  Fecal occult blood test (FOBT) of the stool. You may have this test every year starting at age 93.  Flexible sigmoidoscopy or colonoscopy. You may have a sigmoidoscopy every 5 years or a colonoscopy every 10 years starting at age 32.  Hepatitis C blood test.  Hepatitis B blood test.  Sexually transmitted disease (STD) testing.  Diabetes screening. This is done by checking your blood sugar (glucose) after you have not eaten for a while (fasting). You may have this done every 1-3 years.  Bone density scan. This is done to screen for osteoporosis. You may have this done starting at age 49.  Mammogram. This may be done every 1-2 years. Talk to your health care provider about how often you should have regular mammograms. Talk with your health care provider about your test results, treatment options, and if necessary, the need for more tests. Vaccines  Your health  care provider may recommend certain vaccines, such as:  Influenza vaccine. This is recommended every year.  Tetanus, diphtheria, and acellular pertussis (Tdap, Td) vaccine. You may need a Td booster every 10 years.  Zoster vaccine. You may need this after age 65.  Pneumococcal 13-valent conjugate (PCV13) vaccine. One dose is  recommended after age 71.  Pneumococcal polysaccharide (PPSV23) vaccine. One dose is recommended after age 17. Talk to your health care provider about which screenings and vaccines you need and how often you need them. This information is not intended to replace advice given to you by your health care provider. Make sure you discuss any questions you have with your health care provider. Document Released: 11/23/2015 Document Revised: 07/16/2016 Document Reviewed: 08/28/2015 Elsevier Interactive Patient Education  2017 [place] Prevention in the Home Falls can cause injuries. They can happen to people of all ages. There are many things you can do to make your home safe and to help prevent falls. What can I do on the outside of my home?  Regularly fix the edges of walkways and driveways and fix any cracks.  Remove anything that might make you trip as you walk through a door, such as a raised step or threshold.  Trim any bushes or trees on the path to your home.  Use bright outdoor lighting.  Clear any walking paths of anything that might make someone trip, such as rocks or tools.  Regularly check to see if handrails are loose or broken. Make sure that both sides of any steps have handrails.  Any raised decks and porches should have guardrails on the edges.  Have any leaves, snow, or ice cleared regularly.  Use sand or salt on walking paths during winter.  Clean up any spills in your garage right away. This includes oil or grease spills. What can I do in the bathroom?  Use night lights.  Install grab bars by the toilet and in the tub and shower. Do not use towel bars as grab bars.  Use non-skid mats or decals in the tub or shower.  If you need to sit down in the shower, use a plastic, non-slip stool.  Keep the floor dry. Clean up any water that spills on the floor as soon as it happens.  Remove soap buildup in the tub or shower regularly.  Attach bath mats  securely with double-sided non-slip rug tape.  Do not have throw rugs and other things on the floor that can make you trip. What can I do in the bedroom?  Use night lights.  Make sure that you have a light by your bed that is easy to reach.  Do not use any sheets or blankets that are too big for your bed. They should not hang down onto the floor.  Have a firm chair that has side arms. You can use this for support while you get dressed.  Do not have throw rugs and other things on the floor that can make you trip. What can I do in the kitchen?  Clean up any spills right away.  Avoid walking on wet floors.  Keep items that you use a lot in easy-to-reach places.  If you need to reach something above you, use a strong step stool that has a grab bar.  Keep electrical cords out of the way.  Do not use floor polish or wax that makes floors slippery. If you must use wax, use non-skid floor wax.  Do not have  throw rugs and other things on the floor that can make you trip. What can I do with my stairs?  Do not leave any items on the stairs.  Make sure that there are handrails on both sides of the stairs and use them. Fix handrails that are broken or loose. Make sure that handrails are as long as the stairways.  Check any carpeting to make sure that it is firmly attached to the stairs. Fix any carpet that is loose or worn.  Avoid having throw rugs at the top or bottom of the stairs. If you do have throw rugs, attach them to the floor with carpet tape.  Make sure that you have a light switch at the top of the stairs and the bottom of the stairs. If you do not have them, ask someone to add them for you. What else can I do to help prevent falls?  Wear shoes that:  Do not have high heels.  Have rubber bottoms.  Are comfortable and fit you well.  Are closed at the toe. Do not wear sandals.  If you use a stepladder:  Make sure that it is fully opened. Do not climb a closed  stepladder.  Make sure that both sides of the stepladder are locked into place.  Ask someone to hold it for you, if possible.  Clearly mark and make sure that you can see:  Any grab bars or handrails.  First and last steps.  Where the edge of each step is.  Use tools that help you move around (mobility aids) if they are needed. These include:  Canes.  Walkers.  Scooters.  Crutches.  Turn on the lights when you go into a dark area. Replace any light bulbs as soon as they burn out.  Set up your furniture so you have a clear path. Avoid moving your furniture around.  If any of your floors are uneven, fix them.  If there are any pets around you, be aware of where they are.  Review your medicines with your doctor. Some medicines can make you feel dizzy. This can increase your chance of falling. Ask your doctor what other things that you can do to help prevent falls. This information is not intended to replace advice given to you by your health care provider. Make sure you discuss any questions you have with your health care provider. Document Released: 08/23/2009 Document Revised: 04/03/2016 Document Reviewed: 12/01/2014 Elsevier Interactive Patient Education  2017 [employer].

## 2018-01-07 NOTE — Progress Notes (Signed)
Subjective:   Karla Willis is a 83 y.o. female who presents for Medicare Annual (Subsequent) preventive examination.  Last AWV-01/14/2017    Objective:     Vitals: BP 138/80 (BP Location: Left Arm, Patient Position: Sitting)   Pulse 88   Temp 98.6 F (37 C) (Oral)   Ht 5\' 2"  (1.575 m)   Wt 241 lb (109.3 kg)   SpO2 95%   BMI 44.08 kg/m   Body mass index is 44.08 kg/m.  Advanced Directives 01/07/2018 05/20/2017 03/24/2017 01/14/2017 01/14/2017 11/05/2016 10/06/2016  Does Patient Have a Medical Advance Directive? Yes Yes Yes Yes Yes Yes Yes  Type of Paramedic of [place];Out of facility DNR (pink MOST or yellow form) - Clinical cytogeneticist of [employer] Power of [place];Living will [place];Living will  Does patient want to make changes to medical advance directive? No - Patient declined No - Patient declined - - - - -  Copy of [place] in Chart? Yes Yes - Yes Yes Yes Yes  Pre-existing out of facility DNR order (yellow form or pink MOST form) - Pink MOST form placed in chart (order not valid for inpatient use) - - - - -    Tobacco Social History   Tobacco Use  Smoking Status Never Smoker  Smokeless Tobacco Never Used     Counseling given: Not Answered   Clinical Intake:  Pre-visit preparation completed: No  Pain : No/denies pain     Nutritional Risks: None Diabetes: Yes CBG done?: No Did pt. bring in CBG monitor from home?: No  How often do you need to have someone help you when you read instructions, pamphlets, or other written materials from your doctor or pharmacy?: 1 - Never What is the last grade level you completed in school?: HS  Interpreter Needed?: No  Information entered by :: Tyson Dense, RN  Past Medical History:  Diagnosis Date  . Allergy   . Arthritis   . Breast cancer ([place]) 10/24/13   right upper inner- DCIS  .  Cancer ([place])   . Diabetes mellitus without complication ([place])   . GERD (gastroesophageal reflux disease)    occ  . Gout   . History of hiatal hernia   . HOH (hard of hearing)   . Hypertension   . Hypothyroidism   . Pneumonia    hx  . Thyroid disease   . Wears glasses    Past Surgical History:  Procedure Laterality Date  . BREAST BIOPSY Right    remote, benign  . BREAST LUMPECTOMY WITH NEEDLE LOCALIZATION Right 12/16/2013   Procedure: BREAST LUMPECTOMY WITH NEEDLE LOCALIZATION;  Surgeon: Edward Jolly, MD;  Location: [facility];  Service: General;  Laterality: Right;  . CATARACT EXTRACTION Left 12/30/2017  . CHOLECYSTECTOMY  1970  . COLONOSCOPY    . FINGER SURGERY     right hand-  . gallbladder sugery    . RE-EXCISION OF BREAST LUMPECTOMY Right 01/30/2014   Procedure: RE-EXCISION OF BREAST LUMPECTOMY;  Surgeon: Edward Jolly, MD;  Location: [facility];  Service: General;  Laterality: Right;  . RE-EXCISION OF BREAST LUMPECTOMY Right 03/06/2014   Procedure: RE-EXCISION OF BREAST LUMPECTOMY;  Surgeon: Edward Jolly, MD;  Location: [facility];  Service: General;  Laterality: Right;  . SHOULDER ARTHROSCOPY  2011   left  . THYROID SURGERY  1970  . TOTAL SHOULDER ARTHROPLASTY  Right 05/21/2016  . TOTAL SHOULDER ARTHROPLASTY Right 05/21/2016   Procedure: RIGHT TOTAL SHOULDER ARTHROPLASTY;  Surgeon: Ninetta Lights, MD;  Location: [facility];  Service: Orthopedics;  Laterality: Right;  . TUBAL LIGATION     Family History  Problem Relation Age of Onset  . Diabetes Mother   . Arthritis Father   . Seizures Sister   . Cancer Sister        breast  . Multiple sclerosis Son        Deceased    Social History   Socioeconomic History  . Marital status: Widowed    Spouse name: None  . Number of children: None  . Years of education: None  . Highest education level: None  Social Needs  . Financial resource strain: Not hard at all    . Food insecurity - worry: Never true  . Food insecurity - inability: Never true  . Transportation needs - medical: No  . Transportation needs - non-medical: No  Occupational History  . None  Tobacco Use  . Smoking status: Never Smoker  . Smokeless tobacco: Never Used  Substance and Sexual Activity  . Alcohol use: No    Alcohol/week: 0.0 oz  . Drug use: No  . Sexual activity: Not Currently    Comment: menarche age 5, G66, P53, first live birth age 18, menopause at 63, no HRT  Other Topics Concern  . None  Social History Narrative   Diet: No      Do you drink/ eat things with caffeine? Yes      Marital status:    Widowed                           What year were you married ?  1956      Do you live in a house, apartment,assistred living, condo, trailer, etc.)?  apartment      Is it one or more stories?  one      How many persons live in your home ?  3      Do you have any pets in your home ?(please list)  No      Current or past profession:  Bank, Caregiver      Do you exercise?  No                            Type & how often:        Do you have a living will?  Yes      Do you have a DNR form?  Yes                     If not, do you want to discuss one?       Do you have signed POA?HPOA forms?                 If so, please bring to your        appointment       Outpatient Encounter Medications as of 01/07/2018  Medication Sig  . allopurinol (ZYLOPRIM) 100 MG tablet take 1 tablet by mouth once daily for GOUT PREVENTION  . amLODipine (NORVASC) 5 MG tablet TAKE 1 TABLET BY MOUTH ONCE DAILY  . feeding supplement, GLUCERNA SHAKE, (GLUCERNA SHAKE) LIQD Take 237 mLs by mouth 2 (two) times daily between meals.  . FEROSUL 325 (65 Fe) MG tablet take 1  tablet by mouth once daily WITH BREAKFAST  . glipiZIDE (GLUCOTROL XL) 5 MG 24 hr tablet take 1 tablet by mouth once daily with BREAKFAST  . levothyroxine (SYNTHROID, LEVOTHROID) 100 MCG tablet TAKE 1 TABLET BY MOUTH ONCE DAILY  BEFORE BREAKFAST  . lisinopril (PRINIVIL,ZESTRIL) 20 MG tablet take 1 tablet by mouth once daily  . tamoxifen (NOLVADEX) 20 MG tablet Take 1 tablet (20 mg total) by mouth daily.  . traMADol (ULTRAM) 50 MG tablet take 1 tablets by mouth three times a day with food if needed FOR PAIN  . triamcinolone cream (KENALOG) 0.1 % Apply 1 application topically 2 (two) times daily.  Marland Kitchen Zoster Vaccine Adjuvanted [facility]) injection Inject 0.5 mLs into the muscle once for 1 dose.  . [DISCONTINUED] Zoster Vaccine Adjuvanted [facility]) injection Inject 0.5 mLs into the muscle once.   No facility-administered encounter medications on file as of 01/07/2018.     Activities of Daily Living In your present state of health, do you have any difficulty performing the following activities: 01/07/2018 01/14/2017  Hearing? N Y  Comment - Decreased hearing  Vision? Y Y  Difficulty concentrating or making decisions? N N  Walking or climbing stairs? Y N  Dressing or bathing? N N  Doing errands, shopping? N N  Comment - Pt still drives vehicle.   Preparing Food and eating ? N N  Using the Toilet? N N  In the past six months, have you accidently leaked urine? Y N  Do you have problems with loss of bowel control? N N  Managing your Medications? N N  Managing your Finances? N N  Housekeeping or managing your Housekeeping? N N  Some recent data might be hidden    Patient Care Team: Gildardo Cranker, DO as PCP - General (Internal Medicine) Nicholas Lose, MD as Consulting Physician (Hematology and Oncology) Specialists, [place] as Consulting Physician (Orthopedic Surgery)    Assessment:   This is a routine wellness examination for Jearldine.  Exercise Activities and Dietary recommendations Current Exercise Habits: The patient does not participate in regular exercise at present, Exercise limited by: None identified  Goals    . DIET - INCREASE WATER INTAKE     Patient wiill keep water bottles with her  to increase water intake       Fall Risk Fall Risk  01/07/2018 05/20/2017 01/14/2017 01/14/2017 11/05/2016  Falls in the past year? No Yes No No No  Number falls in past yr: - 1 - - -  Injury with Fall? - No - - -   Is the patient's home free of loose throw rugs in walkways, pet beds, electrical cords, etc?   yes      Grab bars in the bathroom? yes      Handrails on the stairs?   yes      Adequate lighting?   yes  Depression Screen PHQ 2/9 Scores 01/07/2018 01/14/2017 01/14/2017 05/29/2016  PHQ - 2 Score 0 0 0 0     Cognitive Function MMSE - Mini Mental State Exam 01/07/2018 01/14/2017 01/11/2016  Not completed: - - (No Data)  Orientation to time 5 5 5   Orientation to Place 5 5 5   Registration 3 3 3   Attention/ Calculation 5 5 5   Recall 1 3 2   Language- name 2 objects 2 2 2   Language- repeat 1 1 1   Language- follow 3 step command 3 3 3   Language- read & follow direction 1 1 1   Write a sentence 1  1 1  Copy design 1 1 1   Total score 28 30 29         Immunization History  Administered Date(s) Administered  . Influenza,inj,Quad PF,6+ Mos 09/07/2015, 07/25/2016, 09/22/2017  . Influenza-Unspecified 08/10/2014  . Pneumococcal Conjugate-13 01/14/2017  . Pneumococcal-Unspecified 11/10/2013  . Tdap 04/18/2016  . Zoster 04/18/2012    Qualifies for Shingles Vaccine? Yes, educated and prescription sent to pharmacy  Screening Tests Health Maintenance  Topic Date Due  . OPHTHALMOLOGY EXAM  01/08/2018 (Originally 11/06/1945)  . HEMOGLOBIN A1C  03/17/2018  . FOOT EXAM  05/20/2018  . TETANUS/TDAP  04/18/2026  . INFLUENZA VACCINE  Completed  . DEXA SCAN  Completed  . PNA vac Low Risk Adult  Completed    Cancer Screenings: Lung: Low Dose CT Chest recommended if Age 96-80 years, 30 pack-year currently smoking OR have quit w/in 15years. Patient does not qualify. Breast:  Up to date on Mammogram? Yes   Up to date of Bone Density/Dexa? Yes Colorectal: up to date  Additional Screenings:    Hepatitis C Screening: declined Last Eye exam January 2019     Plan:    I have personally reviewed and addressed the Medicare Annual Wellness questionnaire and have noted the following in the patient's chart:  A. Medical and social history B. Use of alcohol, tobacco or illicit drugs  C. Current medications and supplements D. Functional ability and status E.  Nutritional status F.  Physical activity G. Advance directives H. List of other physicians I.  Hospitalizations, surgeries, and ER visits in previous 12 months J.  [place] to include hearing, vision, cognitive, depression L. Referrals and appointments - none  In addition, I have reviewed and discussed with patient certain preventive protocols, quality metrics, and best practice recommendations. A written personalized care plan for preventive services as well as general preventive health recommendations were provided to patient.  See attached scanned questionnaire for additional information.   Signed,   Tyson Dense, RN Nurse Health Advisor  Patient Concerns: None

## 2018-01-13 ENCOUNTER — Ambulatory Visit
Admission: RE | Admit: 2018-01-13 | Discharge: 2018-01-13 | Disposition: A | Discharge status: Home / Self Care | Payer: Medicare Other | Source: Ambulatory Visit | Attending: Internal Medicine | Admitting: Internal Medicine

## 2018-01-13 DIAGNOSIS — R928 Other abnormal and inconclusive findings on diagnostic imaging of breast: Secondary | ICD-10-CM | POA: Diagnosis not present

## 2018-01-13 DIAGNOSIS — Z9889 Other specified postprocedural states: Secondary | ICD-10-CM

## 2018-01-18 ENCOUNTER — Other Ambulatory Visit: Payer: Medicare Other

## 2018-01-18 DIAGNOSIS — E785 Hyperlipidemia, unspecified: Secondary | ICD-10-CM

## 2018-01-18 DIAGNOSIS — E1122 Type 2 diabetes mellitus with diabetic chronic kidney disease: Secondary | ICD-10-CM | POA: Diagnosis not present

## 2018-01-18 DIAGNOSIS — E034 Atrophy of thyroid (acquired): Secondary | ICD-10-CM

## 2018-01-18 DIAGNOSIS — M1000 Idiopathic gout, unspecified site: Secondary | ICD-10-CM

## 2018-01-18 DIAGNOSIS — I10 Essential (primary) hypertension: Secondary | ICD-10-CM | POA: Diagnosis not present

## 2018-01-18 DIAGNOSIS — N182 Chronic kidney disease, stage 2 (mild): Secondary | ICD-10-CM

## 2018-01-19 LAB — CBC WITH DIFFERENTIAL/PLATELET
Basophils Absolute: 41 cells/uL (ref 0–200)
Basophils Relative: 0.9 %
Eosinophils Absolute: 327 cells/uL (ref 15–500)
Eosinophils Relative: 7.1 %
HCT: 35 % (ref 35.0–45.0)
Hemoglobin: 11.5 g/dL — ABNORMAL LOW (ref 11.7–15.5)
Lymphs Abs: 1339 cells/uL (ref 850–3900)
MCH: 28.6 pg (ref 27.0–33.0)
MCHC: 32.9 g/dL (ref 32.0–36.0)
MCV: 87.1 fL (ref 80.0–100.0)
MPV: 11.6 fL (ref 7.5–12.5)
Monocytes Relative: 14.9 %
Neutro Abs: 2208 cells/uL (ref 1500–7800)
Neutrophils Relative %: 48 %
Platelets: 197 10*3/uL (ref 140–400)
RBC: 4.02 10*6/uL (ref 3.80–5.10)
RDW: 13.6 % (ref 11.0–15.0)
Total Lymphocyte: 29.1 %
WBC mixed population: 685 cells/uL (ref 200–950)
WBC: 4.6 10*3/uL (ref 3.8–10.8)

## 2018-01-19 LAB — COMPLETE METABOLIC PANEL WITH GFR
AG Ratio: 1.3 (calc) (ref 1.0–2.5)
ALT: 9 U/L (ref 6–29)
AST: 14 U/L (ref 10–35)
Albumin: 3.5 g/dL — ABNORMAL LOW (ref 3.6–5.1)
Alkaline phosphatase (APISO): 70 U/L (ref 33–130)
BUN/Creatinine Ratio: 19 (calc) (ref 6–22)
BUN: 17 mg/dL (ref 7–25)
CO2: 27 mmol/L (ref 20–32)
Calcium: 9.1 mg/dL (ref 8.6–10.4)
Chloride: 109 mmol/L (ref 98–110)
Creat: 0.89 mg/dL — ABNORMAL HIGH (ref 0.60–0.88)
GFR, Est African American: 71 mL/min/{1.73_m2} (ref >=60)
GFR, Est Non African American: 61 mL/min/{1.73_m2} (ref >=60)
Globulin: 2.7 g/dL (calc) (ref 1.9–3.7)
Glucose, Bld: 106 mg/dL — ABNORMAL HIGH (ref 65–99)
Potassium: 4.5 mmol/L (ref 3.5–5.3)
Sodium: 141 mmol/L (ref 135–146)
Total Bilirubin: 0.3 mg/dL (ref 0.2–1.2)
Total Protein: 6.2 g/dL (ref 6.1–8.1)

## 2018-01-19 LAB — URIC ACID: Uric Acid, Serum: 4.5 mg/dL (ref 2.5–7.0)

## 2018-01-19 LAB — LIPID PANEL
Cholesterol: 158 mg/dL (ref <200)
HDL: 53 mg/dL (ref >50)
LDL Cholesterol (Calc): 86 mg/dL (calc)
Non-HDL Cholesterol (Calc): 105 mg/dL (calc) (ref <130)
Total CHOL/HDL Ratio: 3 (calc) (ref <5.0)
Triglycerides: 94 mg/dL (ref <150)

## 2018-01-19 LAB — HEMOGLOBIN A1C
Hgb A1c MFr Bld: 6.2 % of total Hgb — ABNORMAL HIGH (ref <5.7)
Mean Plasma Glucose: 131 (calc)
eAG (mmol/L): 7.3 (calc)

## 2018-01-19 LAB — MICROALBUMIN / CREATININE URINE RATIO
Creatinine, Urine: 68 mg/dL (ref 20–275)
Microalb, Ur: <0.2 mg/dL

## 2018-01-19 LAB — TSH: TSH: 0.83 mIU/L (ref 0.40–4.50)

## 2018-01-20 ENCOUNTER — Encounter: Payer: Medicare Other | Admitting: Internal Medicine

## 2018-01-22 ENCOUNTER — Ambulatory Visit (INDEPENDENT_AMBULATORY_CARE_PROVIDER_SITE_OTHER): Payer: Medicare Other | Admitting: Internal Medicine

## 2018-01-22 ENCOUNTER — Encounter: Payer: Self-pay | Admitting: Internal Medicine

## 2018-01-22 VITALS — BP 124/72 | HR 72 | Temp 98.5°F | Ht 62.0 in | Wt 244.0 lb

## 2018-01-22 DIAGNOSIS — L309 Dermatitis, unspecified: Secondary | ICD-10-CM | POA: Diagnosis not present

## 2018-01-22 DIAGNOSIS — Z0000 Encounter for general adult medical examination without abnormal findings: Secondary | ICD-10-CM

## 2018-01-22 DIAGNOSIS — R600 Localized edema: Secondary | ICD-10-CM

## 2018-01-22 DIAGNOSIS — E034 Atrophy of thyroid (acquired): Secondary | ICD-10-CM

## 2018-01-22 DIAGNOSIS — N182 Chronic kidney disease, stage 2 (mild): Secondary | ICD-10-CM | POA: Diagnosis not present

## 2018-01-22 DIAGNOSIS — M150 Primary generalized (osteo)arthritis: Secondary | ICD-10-CM

## 2018-01-22 DIAGNOSIS — M8949 Other hypertrophic osteoarthropathy, multiple sites: Secondary | ICD-10-CM

## 2018-01-22 DIAGNOSIS — E441 Mild protein-calorie malnutrition: Secondary | ICD-10-CM | POA: Diagnosis not present

## 2018-01-22 DIAGNOSIS — Z853 Personal history of malignant neoplasm of breast: Secondary | ICD-10-CM

## 2018-01-22 DIAGNOSIS — M159 Polyosteoarthritis, unspecified: Secondary | ICD-10-CM

## 2018-01-22 DIAGNOSIS — I10 Essential (primary) hypertension: Secondary | ICD-10-CM

## 2018-01-22 DIAGNOSIS — E1122 Type 2 diabetes mellitus with diabetic chronic kidney disease: Secondary | ICD-10-CM | POA: Diagnosis not present

## 2018-01-22 MED ORDER — TRIAMCINOLONE ACETONIDE 0.1 % EX CREA: 1.0000 "application " | TOPICAL_CREAM | Freq: Two times a day (BID) | CUTANEOUS | 3 refills | Status: DC

## 2018-01-22 NOTE — Progress Notes (Signed)
Patient ID: Karla Willis, female   DOB: Oct 21, 1935, 83 y.o.   MRN: [id number]   Location:  [facility]  Place of Service:  OFFICE  Provider: Arletha Grippe, DO  Patient Care Team: Gildardo Cranker, DO as PCP - General (Internal Medicine) Nicholas Lose, MD as Consulting Physician (Hematology and Oncology) Specialists, [place] as Consulting Physician (Orthopedic Surgery)  Extended Emergency Contact Information Primary Emergency Contact: [name] Address: [address]          [place], [place] [address] [place] Phone: [phone] Mobile Phone: [phone] Relation: Daughter Secondary Emergency Contact: [name], [place] [address] [name] Phone: [phone] Relation: Daughter  Code Status:  Goals of Care: Advanced Directive information Advanced Directives 01/07/2018  Does Patient Have a Medical Advance Directive? Yes  Type of Advance Directive [place]  Does patient want to make changes to medical advance directive? No - Patient declined  Copy of [place] in Chart? Yes  Pre-existing out of facility DNR order (yellow form or pink MOST form) -     Chief Complaint  Patient presents with  . Annual Exam    Yearly check-up, EKG completed less than 1 year ago, and AWV completed 01/07/18  . Health Maintenance    Eye Exam- patient states eye exam UTD (Dr.Groat)   . Form Completion    Disability Parking Placard     HPI: Patient is a 83 y.o. female seen in today for a comprehensive exam. She completed her AWV on 01/07/18 - note reviewed. MMSE 28/30    DM - BS at home stable. She does not check daily.  No low BS reactions. She takes glipizide xl. She has some swelling in L>R foot. She stopped actos. Occasional  numbness or tingling. A1c 6.2%. LDL 86; urine microalbumin/Cr ratio <0.2.  Leg cramps - improved. she had to stop gabapentin due to next day dizziness. She is  taking tramadol prn which helps  HTN - BP stable on amlodipine, lisinopril. She noticed more swelling in legs/feet off HCTZ. Cr 0.89  Thyroid - stable on levothyroxine. TSH 0.83; T4 free 1.3  Arthritis - stable on tramadol. Takes allopurinol for gout. No recent gout attacks. She is followed by Bosie Clos and [facility]. Last left hip injection was in Mar 2017. Uric acid level 4.5  Hx breast CA - followed by oncology Dr [name]. Last mammogram in Dec 2017 was benign. She takes tamoxifen and has occasional night sweats and hot flashes. No signs of recurrence  Weight loss - improved. albumin 3.5. She gets glucerna intermittently. Appetite improved since family moved into larger home. Weight stable.  Depression screen [facility] 2/9 01/22/2018 01/07/2018 01/14/2017 01/14/2017 05/29/2016  Decreased Interest 0 0 0 0 0  Down, Depressed, Hopeless 0 0 0 0 0  PHQ - 2 Score 0 0 0 0 0    Fall Risk  01/22/2018 01/07/2018 05/20/2017 01/14/2017 01/14/2017  Falls in the past year? No No Yes No No  Number falls in past yr: - - 1 - -  Injury with Fall? - - No - -   MMSE - Mini Mental State Exam 01/07/2018 01/14/2017 01/11/2016  Not completed: - - (No Data)  Orientation to time '5 5 5  ' Orientation to Place '5 5 5  ' Registration '3 3 3  ' Attention/ Calculation '5 5 5  ' Recall '1 3 2  ' Language- name 2 objects  '2 2 2  ' Language- repeat '1 1 1  ' Language- follow 3 step command '3 3 3  ' Language- read & follow direction '1 1 1  ' Write a sentence '1 1 1  ' Copy design '1 1 1  ' Total score '28 30 29     ' Health Maintenance  Topic Date Due  . FOOT EXAM  05/20/2018  . HEMOGLOBIN A1C  07/21/2018  . OPHTHALMOLOGY EXAM  12/31/2018  . TETANUS/TDAP  04/18/2026  . INFLUENZA VACCINE  Completed  . DEXA SCAN  Completed  . PNA vac Low Risk Adult  Completed    Past Medical History:  Diagnosis Date  . Allergy   . Arthritis   . Breast cancer ([place]) 10/24/13   right upper inner- DCIS  . Cancer ([place])   . Diabetes mellitus without complication ([place])   .  GERD (gastroesophageal reflux disease)    occ  . Gout   . History of hiatal hernia   . HOH (hard of hearing)   . Hypertension   . Hypothyroidism   . Pneumonia    hx  . Thyroid disease   . Wears glasses     Past Surgical History:  Procedure Laterality Date  . BREAST BIOPSY Right 10/26/2013  . BREAST LUMPECTOMY Right 12/16/2013  . BREAST LUMPECTOMY WITH NEEDLE LOCALIZATION Right 12/16/2013   Procedure: BREAST LUMPECTOMY WITH NEEDLE LOCALIZATION;  Surgeon: Edward Jolly, MD;  Location: [facility];  Service: General;  Laterality: Right;  . CATARACT EXTRACTION Left 12/30/2017  . CHOLECYSTECTOMY  1970  . COLONOSCOPY    . FINGER SURGERY     right hand-  . gallbladder sugery    . RE-EXCISION OF BREAST LUMPECTOMY Right 01/30/2014   Procedure: RE-EXCISION OF BREAST LUMPECTOMY;  Surgeon: Edward Jolly, MD;  Location: [facility];  Service: General;  Laterality: Right;  . RE-EXCISION OF BREAST LUMPECTOMY Right 03/06/2014   Procedure: RE-EXCISION OF BREAST LUMPECTOMY;  Surgeon: Edward Jolly, MD;  Location: [facility];  Service: General;  Laterality: Right;  . SHOULDER ARTHROSCOPY  2011   left  . THYROID SURGERY  1970  . TOTAL SHOULDER ARTHROPLASTY Right 05/21/2016  . TOTAL SHOULDER ARTHROPLASTY Right 05/21/2016   Procedure: RIGHT TOTAL SHOULDER ARTHROPLASTY;  Surgeon: Ninetta Lights, MD;  Location: [facility];  Service: Orthopedics;  Laterality: Right;  . TUBAL LIGATION      Family History  Problem Relation Age of Onset  . Diabetes Mother   . Arthritis Father   . Seizures Sister   . Cancer Sister        breast  . Breast cancer Sister   . Multiple sclerosis Son        Deceased    Family Status  Relation Name Status  . Mother ella Deceased at age 63  . Father julius Deceased  . Sister patricia Deceased       80years old  . Sister Bluford Kaufmann       22years old  . Son Gwyndolyn Saxon Alive       60 years old  . Daughter ella  Alive       15 years old  . Son Letona Alive       77 years old  . Daughter debra Alive       15 years old  . Sister patrice (Not Specified)  . Son Gwyndolyn Saxon (Not Specified)    shoulder  Social History   Socioeconomic History  . Marital status: Widowed  Spouse name: Not on file  . Number of children: Not on file  . Years of education: Not on file  . Highest education level: Not on file  Social Needs  . Financial resource strain: Not hard at all  . Food insecurity - worry: Never true  . Food insecurity - inability: Never true  . Transportation needs - medical: No  . Transportation needs - non-medical: No  Occupational History  . Not on file  Tobacco Use  . Smoking status: Never Smoker  . Smokeless tobacco: Never Used  Substance and Sexual Activity  . Alcohol use: No    Alcohol/week: 0.0 oz  . Drug use: No  . Sexual activity: Not Currently    Comment: menarche age 55, G79, P70, first live birth age 5, menopause at 4, no HRT  Other Topics Concern  . Not on file  Social History Narrative   Diet: No      Do you drink/ eat things with caffeine? Yes      Marital status:    Widowed                           What year were you married ?  1956      Do you live in a house, apartment,assistred living, condo, trailer, etc.)?  apartment      Is it one or more stories?  one      How many persons live in your home ?  3      Do you have any pets in your home ?(please list)  No      Current or past profession:  Bank, Caregiver      Do you exercise?  No                            Type & how often:        Do you have a living will?  Yes      Do you have a DNR form?  Yes                     If not, do you want to discuss one?       Do you have signed POA?HPOA forms?                 If so, please bring to your        appointment       Allergies  Allergen Reactions  . Actos [Pioglitazone] Swelling  . Hydrocodone Nausea Only and Other (See Comments)    "bloated"  .  Codeine Rash  . Neurontin [Gabapentin] Palpitations and Other (See Comments)    dizziness  . Other Rash    Nuts--PECANS  . Peanut-Containing Drug Products Rash    Allergies as of 01/22/2018      Reactions   Actos [pioglitazone] Swelling   Hydrocodone Nausea Only, Other (See Comments)   "bloated"   Codeine Rash   Neurontin [gabapentin] Palpitations, Other (See Comments)   dizziness   Other Rash   Nuts--PECANS   Peanut-containing Drug Products Rash      Medication List        Accurate as of 01/22/18  1:07 PM. Always use your most recent med list.          allopurinol 100 MG tablet Commonly known as:  ZYLOPRIM take 1 tablet by mouth once daily for  GOUT PREVENTION   amLODipine 5 MG tablet Commonly known as:  NORVASC TAKE 1 TABLET BY MOUTH ONCE DAILY   feeding supplement (GLUCERNA SHAKE) Liqd Take 237 mLs by mouth 2 (two) times daily between meals.   FEROSUL 325 (65 FE) MG tablet Generic drug:  ferrous sulfate take 1 tablet by mouth once daily WITH BREAKFAST   glipiZIDE 5 MG 24 hr tablet Commonly known as:  GLUCOTROL XL take 1 tablet by mouth once daily with BREAKFAST   levothyroxine 100 MCG tablet Commonly known as:  SYNTHROID, LEVOTHROID TAKE 1 TABLET BY MOUTH ONCE DAILY BEFORE BREAKFAST   lisinopril 20 MG tablet Commonly known as:  PRINIVIL,ZESTRIL take 1 tablet by mouth once daily   tamoxifen 20 MG tablet Commonly known as:  NOLVADEX Take 1 tablet (20 mg total) by mouth daily.   traMADol 50 MG tablet Commonly known as:  ULTRAM take 1 tablets by mouth three times a day with food if needed FOR PAIN   triamcinolone cream 0.1 % Commonly known as:  KENALOG Apply 1 application topically 2 (two) times daily.        Review of Systems:  Review of Systems  Physical Exam: Vitals:   01/22/18 1250  BP: 124/72  Pulse: 72  Temp: 98.5 F (36.9 C)  TempSrc: Oral  SpO2: 98%  Weight: 244 lb (110.7 kg)  Height: '5\' 2"'  (1.575 m)   Body mass index is 44.63  kg/m. Physical Exam  Constitutional: She is oriented to person, place, and time. She appears well-developed and well-nourished. No distress.  HENT:  Head: Normocephalic and atraumatic.  Right Ear: External ear normal.  Left Ear: External ear normal.  Mouth/Throat: Oropharynx is clear and moist. No oropharyngeal exudate.  MMM; no oral thrush  Eyes: EOM are normal. Pupils are equal, round, and reactive to light. No scleral icterus.  Neck: Normal range of motion. Neck supple. Carotid bruit is not present. No tracheal deviation present. No thyromegaly present.  Cardiovascular: Normal rate, regular rhythm and intact distal pulses. Exam reveals no gallop and no friction rub.  Murmur (1/6 SEM) heard. No LE edema b/l. No calf TTP  Pulmonary/Chest: Effort normal and breath sounds normal. No respiratory distress. She has no wheezes. She has no rales. She exhibits no tenderness. Right breast exhibits no inverted nipple, no mass, no nipple discharge, no skin change and no tenderness. Left breast exhibits no inverted nipple, no mass, no nipple discharge and no skin change. Breasts are symmetrical.  No rhonchi  Abdominal: Soft. Bowel sounds are normal. She exhibits no distension and no mass. There is no hepatosplenomegaly or hepatomegaly. There is no tenderness. There is no rebound and no guarding. No hernia.  Musculoskeletal: She exhibits edema and tenderness. She exhibits no deformity.  Lymphadenopathy:    She has no cervical adenopathy.  Neurological: She is alert and oriented to person, place, and time. She has normal reflexes.  Skin: Skin is warm and dry. No rash noted.  Psychiatric: She has a normal mood and affect. Her behavior is normal. Judgment and thought content normal.  Vitals reviewed.  Diabetic Foot Exam - Simple   Simple Foot Form Diabetic Foot exam was performed with the following findings:  Yes 01/22/2018  1:26 PM  Visual Inspection See comments:  Yes Sensation Testing Intact to  touch and monofilament testing bilaterally:  Yes Pulse Check Posterior Tibialis and Dorsalis pulse intact bilaterally:  Yes Comments B/l bunions; no ulcerations but calluses noted     Labs reviewed:  Basic Metabolic Panel: Recent Labs    05/19/17 1343 09/17/17 0922 01/18/18 1015  NA 143 140 141  K 4.8 4.4 4.5  CL 109 108 109  CO2 '23 25 27  ' GLUCOSE 105* 94 106*  BUN '19 17 17  ' CREATININE 1.03* 1.04* 0.89*  CALCIUM 9.4 8.9 9.1  TSH 0.39* 1.46 0.83   Liver Function Tests: Recent Labs    05/19/17 1343 09/17/17 0922 01/18/18 1015  AST  --  13 14  ALT '8 6 9  ' BILITOT  --  0.4 0.3  PROT  --  6.2 6.2   No results for input(s): LIPASE, AMYLASE in the last 8760 hours. No results for input(s): AMMONIA in the last 8760 hours. CBC: Recent Labs    01/18/18 1015  WBC 4.6  NEUTROABS 2,208  HGB 11.5*  HCT 35.0  MCV 87.1  PLT 197   Lipid Panel: Recent Labs    05/19/17 1343 09/17/17 0922 01/18/18 1015  CHOL 154 142 158  HDL 60 57 53  LDLCALC 74 65 86  TRIG 98 121 94  CHOLHDL 2.6 2.5 3.0   Lab Results  Component Value Date   HGBA1C 6.2 (H) 01/18/2018    Procedures: Mm Diag Breast Tomo Bilateral  Result Date: 01/13/2018 CLINICAL DATA:  Status post right lumpectomy for breast cancer in 2015. No radiation therapy or chemotherapy. EXAM: DIGITAL DIAGNOSTIC BILATERAL MAMMOGRAM WITH CAD AND TOMO COMPARISON:  Previous exam(s). ACR Breast Density Category b: There are scattered areas of fibroglandular density. FINDINGS: Stable post lumpectomy changes on the right. No interval findings suspicious for malignancy in either breast. Mammographic images were processed with CAD. IMPRESSION: No evidence of malignancy. RECOMMENDATION: Bilateral diagnostic mammogram in 1 year. I have discussed the findings and recommendations with the patient. Results were also provided in writing at the conclusion of the visit. If applicable, a reminder letter will be sent to the patient regarding the  next appointment. BI-RADS CATEGORY  2: Benign. Electronically Signed   By: Claudie Revering M.D.   On: 01/13/2018 16:16    Assessment/Plan   ICD-10-CM   1. Well adult exam Z00.00   2. Primary osteoarthritis involving multiple joints M15.0   3. Dermatitis L30.9 triamcinolone cream (KENALOG) 0.1 %  4. Hypothyroidism due to acquired atrophy of thyroid E03.4 TSH    T4, Free  5. Type 2 diabetes mellitus with stage 2 chronic kidney disease, without long-term current use of insulin (HCC) E11.22 BMP with eGFR([facility])   N18.2 ALT    Lipid Panel    Hemoglobin A1c  6. Essential hypertension, benign I10 BMP with eGFR([facility])  7. Bilateral edema of lower extremity R60.0 BMP with eGFR([facility])  8. History of breast cancer Z85.3   9. Mild protein-calorie malnutrition (HCC) E44.1    Continue current medications as ordered  Handicap placard application completed  Follow up with specialists as scheduled  Follow up in 4 mos for DM, HTN, joint pain, thyroid. Fasting labs prior to appt     Hildreth S. Perlie Gold  [facility] [address] [place], [place] [address] [phone] Cell (Monday-Friday 8 AM - 5 PM) [phone] After 5 PM and follow prompts

## 2018-01-22 NOTE — Patient Instructions (Addendum)
Continue current medications as ordered  Handicap placard application completed  Follow up with specialists as scheduled  Follow up in 4 mos for DM, HTN, joint pain, thyroid. Fasting labs prior to appt

## 2018-02-07 ENCOUNTER — Other Ambulatory Visit: Payer: Self-pay | Admitting: Internal Medicine

## 2018-02-25 ENCOUNTER — Ambulatory Visit: Payer: Medicare Other | Admitting: Nurse Practitioner

## 2018-02-26 ENCOUNTER — Telehealth: Payer: Self-pay | Admitting: Hematology and Oncology

## 2018-02-26 NOTE — Telephone Encounter (Signed)
Called pt re moving appt to 5/16 due to [facility] having bmdc. Left vm for pt re appts. Sending confirmation in the mail.

## 2018-03-01 ENCOUNTER — Other Ambulatory Visit: Payer: Self-pay | Admitting: Internal Medicine

## 2018-03-12 ENCOUNTER — Telehealth: Payer: Self-pay

## 2018-03-12 NOTE — Telephone Encounter (Signed)
Patient called to canceled her appointment after speaking with her she declined to r/s at this time. Per 5/3 phone message return

## 2018-03-17 ENCOUNTER — Other Ambulatory Visit: Payer: Self-pay | Admitting: Internal Medicine

## 2018-03-24 ENCOUNTER — Ambulatory Visit: Payer: Medicare Other | Admitting: Hematology and Oncology

## 2018-03-25 ENCOUNTER — Ambulatory Visit: Payer: Medicare Other | Admitting: Hematology and Oncology

## 2018-03-29 ENCOUNTER — Telehealth: Payer: Self-pay | Admitting: Hematology and Oncology

## 2018-03-29 NOTE — Telephone Encounter (Signed)
Patient called to reschedule  °

## 2018-03-30 ENCOUNTER — Telehealth: Payer: Self-pay | Admitting: Hematology and Oncology

## 2018-03-30 NOTE — Telephone Encounter (Signed)
Returned call to patient re r/s 0/22 f/u due to conflict. Gave patient new f/u for 5/30 @ 9:30 am.

## 2018-04-07 ENCOUNTER — Ambulatory Visit: Payer: Medicare Other | Admitting: Hematology and Oncology

## 2018-04-08 ENCOUNTER — Telehealth: Payer: Self-pay | Admitting: Hematology and Oncology

## 2018-04-08 ENCOUNTER — Inpatient Hospital Stay: Payer: Medicare Other | Attending: Hematology and Oncology | Admitting: Hematology and Oncology

## 2018-04-08 DIAGNOSIS — D0511 Intraductal carcinoma in situ of right breast: Secondary | ICD-10-CM | POA: Diagnosis not present

## 2018-04-08 DIAGNOSIS — I10 Essential (primary) hypertension: Secondary | ICD-10-CM | POA: Diagnosis not present

## 2018-04-08 DIAGNOSIS — Z170 Estrogen receptor positive status [ER+]: Secondary | ICD-10-CM

## 2018-04-08 DIAGNOSIS — E119 Type 2 diabetes mellitus without complications: Secondary | ICD-10-CM | POA: Diagnosis not present

## 2018-04-08 DIAGNOSIS — M79605 Pain in left leg: Secondary | ICD-10-CM | POA: Diagnosis not present

## 2018-04-08 DIAGNOSIS — C50311 Malignant neoplasm of lower-inner quadrant of right female breast: Secondary | ICD-10-CM | POA: Diagnosis not present

## 2018-04-08 MED ORDER — TAMOXIFEN CITRATE 20 MG PO TABS: 20.0000 mg | ORAL_TABLET | Freq: Every day | ORAL | 3 refills | Status: DC

## 2018-04-08 NOTE — Telephone Encounter (Signed)
Gave patient AVS and calendar of upcoming June 2020 appointments.

## 2018-04-08 NOTE — Progress Notes (Signed)
Patient Care Team: Gildardo Cranker, DO as PCP - General (Internal Medicine) Nicholas Lose, MD as Consulting Physician (Hematology and Oncology) Specialists, [place] as Consulting Physician (Orthopedic Surgery)  DIAGNOSIS:  Encounter Diagnosis  Name Primary?  . Malignant neoplasm of lower-inner quadrant of right breast of female, estrogen receptor positive ([place])     SUMMARY OF ONCOLOGIC HISTORY:   Breast cancer of lower-inner quadrant of right female breast ([place])   10/19/2013 Initial Diagnosis    DCIS ER/PR positive low grade      12/16/2013 Surgery    Lumpectomy right breast: DCIS with calcifications low-grade 1.6 cm focally 0.1 cm to lateral margin, excision of right posterior margin continued to show DCIS that led to the third surgery for reexcision which was negative for DCIS ER 100% PR 60%      01/09/2014 -  Anti-estrogen oral therapy    Tamoxifen 20 mg daily (patient not a candidate for radiation because of body habitus)       CHIEF COMPLIANT: Follow-up on tamoxifen therapy  INTERVAL HISTORY: Karla Willis is a 83 year old with above-mentioned history of right breast DCIS lumpectomy and is currently on oral antiestrogen therapy with tamoxifen.  She completed 4 years of therapy.  She reports no major problems or concerns from tamoxifen.  Mammograms done in March were normal.  She denies any lumps or nodules in the breast.  REVIEW OF SYSTEMS:   Constitutional: Denies fevers, chills or abnormal weight loss Eyes: Denies blurriness of vision Ears, nose, mouth, throat, and face: Denies mucositis or sore throat Respiratory: Denies cough, dyspnea or wheezes Cardiovascular: Denies palpitation, chest discomfort Gastrointestinal:  Denies nausea, heartburn or change in bowel habits Skin: Denies abnormal skin rashes Lymphatics: Denies new lymphadenopathy or easy bruising Neurological:Denies numbness, tingling or new weaknesses Behavioral/Psych: Mood is stable, no  new changes  Extremities: No lower extremity edema Breast:  denies any pain or lumps or nodules in either breasts All other systems were reviewed with the patient and are negative.  I have reviewed the past medical history, past surgical history, social history and family history with the patient and they are unchanged from previous note.  ALLERGIES:  is allergic to actos [pioglitazone]; hydrocodone; codeine; neurontin [gabapentin]; other; and peanut-containing drug products.  MEDICATIONS:  Current Outpatient Medications  Medication Sig Dispense Refill  . allopurinol (ZYLOPRIM) 100 MG tablet TAKE 1 TABLET BY MOUTH ONCE DAILY FOR GOUT 30 tablet 0  . amLODipine (NORVASC) 5 MG tablet TAKE 1 TABLET BY MOUTH ONCE DAILY 30 tablet 3  . feeding supplement, GLUCERNA SHAKE, (GLUCERNA SHAKE) LIQD Take 237 mLs by mouth 2 (two) times daily between meals. 30 Can 0  . FEROSUL 325 (65 Fe) MG tablet TAKE 1 TABLET BY MOUTH ONCE DAILY WITH BREAKFAST 30 tablet 6  . glipiZIDE (GLUCOTROL XL) 5 MG 24 hr tablet take 1 tablet by mouth once daily with BREAKFAST 30 tablet 6  . levothyroxine (SYNTHROID, LEVOTHROID) 100 MCG tablet TAKE 1 TABLET BY MOUTH ONCE DAILY BEFORE BREAKFAST 30 tablet 5  . lisinopril (PRINIVIL,ZESTRIL) 20 MG tablet TAKE 1 TABLET BY MOUTH ONCE DAILY 90 tablet 1  . tamoxifen (NOLVADEX) 20 MG tablet Take 1 tablet (20 mg total) by mouth daily. 90 tablet 3  . traMADol (ULTRAM) 50 MG tablet TAKE 1 TABLET BY MOUTH THREE TIMES A DAY IF NEEDED FOR PAIN WITH FOOD 90 tablet 0  . triamcinolone cream (KENALOG) 0.1 % Apply 1 application topically 2 (two) times daily. 45 g 3  No current facility-administered medications for this visit.     PHYSICAL EXAMINATION: ECOG PERFORMANCE STATUS: 0 - Asymptomatic  There were no vitals filed for this visit. There were no vitals filed for this visit.  GENERAL:alert, no distress and comfortable SKIN: skin color, texture, turgor are normal, no rashes or significant  lesions EYES: normal, Conjunctiva are pink and non-injected, sclera clear OROPHARYNX:no exudate, no erythema and lips, buccal mucosa, and tongue normal  NECK: supple, thyroid normal size, non-tender, without nodularity LYMPH:  no palpable lymphadenopathy in the cervical, axillary or inguinal LUNGS: clear to auscultation and percussion with normal breathing effort HEART: regular rate & rhythm and no murmurs and no lower extremity edema ABDOMEN:abdomen soft, non-tender and normal bowel sounds MUSCULOSKELETAL:no cyanosis of digits and no clubbing  NEURO: alert & oriented x 3 with fluent speech, no focal motor/sensory deficits EXTREMITIES: No lower extremity edema BREAST: No palpable masses or nodules in either right or left breasts. No palpable axillary supraclavicular or infraclavicular adenopathy no breast tenderness or nipple discharge. (exam performed in the presence of a chaperone)  LABORATORY DATA:  I have reviewed the data as listed CMP Latest Ref Rng & Units 01/18/2018 09/17/2017 05/19/2017  Glucose 65 - 99 mg/dL 106(H) 94 105(H)  BUN 7 - 25 mg/dL 17 17 19   Creatinine 0.60 - 0.88 mg/dL 0.89(H) 1.04(H) 1.03(H)  Sodium 135 - 146 mmol/L 141 140 143  Potassium 3.5 - 5.3 mmol/L 4.5 4.4 4.8  Chloride 98 - 110 mmol/L 109 108 109  CO2 20 - 32 mmol/L 27 25 23   Calcium 8.6 - 10.4 mg/dL 9.1 8.9 9.4  Total Protein 6.1 - 8.1 g/dL 6.2 6.2 -  Total Bilirubin 0.2 - 1.2 mg/dL 0.3 0.4 -  Alkaline Phos 33 - 130 U/L - - -  AST 10 - 35 U/L 14 13 -  ALT 6 - 29 U/L 9 6 8     Lab Results  Component Value Date   WBC 4.6 01/18/2018   HGB 11.5 (L) 01/18/2018   HCT 35.0 01/18/2018   MCV 87.1 01/18/2018   PLT 197 01/18/2018   NEUTROABS 2,208 01/18/2018    ASSESSMENT & PLAN:  Breast cancer of lower-inner quadrant of right female breast ([place]) DCIS right breast 1.6 cm wide 3 reexcision surgeries were negative margins. Patient did not undergo radiation therapy he 100% PR 60% positive. She was started on  tamoxifen in April 2015.  Tamoxifen toxicities: 1. Hot flashes occasional Patient has 1 more year of tamoxifen therapy left. Breast cancer surveillance: 1. Mammograms 01/13/2018 were normal breast density category B 2. Breast exam  04/08/2018 is normal  Hypertension and diabetes: Being managed by her primary care physician Weight loss: Patient had significant weight loss last year but has stabilized at this current weight. She is still obese. Hospitalization07/15/2017 due to acute on chronic renal failure  Left leg pain: Probably related to sciatica. Return to clinic in 1 year for follow-up      No orders of the defined types were placed in this encounter.  The patient has a good understanding of the overall plan. she agrees with it. she will call with any problems that may develop before the next visit here.   Harriette Ohara, MD 04/08/18

## 2018-04-08 NOTE — Assessment & Plan Note (Signed)
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 Patient did not undergo radiation therapy he 100% PR 60% positive. She was started on tamoxifen in April 2015.  Tamoxifen toxicities: 1. Hot flashes occasional Patient has 1 more year of tamoxifen therapy left. Breast cancer surveillance: 1. Mammograms 01/13/2018 were normal breast density category B 2. Breast exam  04/08/2018 is normal  Hypertension and diabetes: Being managed by her primary care physician Weight loss: Patient had significant weight loss last year but has stabilized at this current weight. She is still obese. Hospitalization07/15/2017 due to acute on chronic renal failure  Left leg pain: Probably related to sciatica. Return to clinic in 1 year for follow-up

## 2018-04-16 ENCOUNTER — Other Ambulatory Visit: Payer: Self-pay | Admitting: Internal Medicine

## 2018-04-22 ENCOUNTER — Encounter: Payer: Self-pay | Admitting: Nurse Practitioner

## 2018-04-22 ENCOUNTER — Ambulatory Visit
Admission: RE | Admit: 2018-04-22 | Discharge: 2018-04-22 | Disposition: A | Discharge status: Home / Self Care | Payer: Medicare Other | Source: Ambulatory Visit | Attending: Nurse Practitioner | Admitting: Nurse Practitioner

## 2018-04-22 ENCOUNTER — Ambulatory Visit (INDEPENDENT_AMBULATORY_CARE_PROVIDER_SITE_OTHER): Payer: Medicare Other | Admitting: Nurse Practitioner

## 2018-04-22 VITALS — BP 138/68 | HR 79 | Temp 98.1°F | Ht 62.0 in | Wt 244.0 lb

## 2018-04-22 DIAGNOSIS — R05 Cough: Secondary | ICD-10-CM | POA: Diagnosis not present

## 2018-04-22 DIAGNOSIS — J40 Bronchitis, not specified as acute or chronic: Secondary | ICD-10-CM | POA: Diagnosis not present

## 2018-04-22 MED ORDER — DOXYCYCLINE HYCLATE 100 MG PO TABS: 100.0000 mg | ORAL_TABLET | Freq: Two times a day (BID) | ORAL | 0 refills | Status: DC

## 2018-04-22 NOTE — Progress Notes (Signed)
Careteam: Patient Care Team: Gildardo Cranker, DO as PCP - General (Internal Medicine) Nicholas Lose, MD as Consulting Physician (Hematology and Oncology) [facility] as Consulting Physician (Orthopedic Surgery)  Advanced Directive information Does Patient Have a Medical Advance Directive?: Yes, Type of Advance Directive: [place];Living will;Out of facility DNR (pink MOST or yellow form), Pre-existing out of facility DNR order (yellow form or pink MOST form): Yellow form placed in chart (order not valid for inpatient use)  Allergies  Allergen Reactions  . Actos [Pioglitazone] Swelling  . Hydrocodone Nausea Only and Other (See Comments)    "bloated"  . Codeine Rash  . Neurontin [Gabapentin] Palpitations and Other (See Comments)    dizziness  . Other Rash    Nuts--PECANS  . Peanut-Containing Drug Products Rash    Chief Complaint  Patient presents with  . Acute Visit    Pt is being seen due to cough with some thick, white mucus for about a week     HPI: Patient is a 83 y.o. female seen in the office today due to sore throat and cough for 1 week. Nonproductive cough but feels congested in her chest. Having a hard time eating due to coughing so much.  Can not sleep due to cough.  Earlier in the week feels like she had a fever due to gown being wet. Did not take temperature. Has chills.  "felt cold rattling in chest earlier this week"   Overall feels like she may be little better from earlier this week but cough is persistent.  Has taken robitussin (not much) Ginger tea.   Review of Systems:  Review of Systems  Constitutional: Positive for chills and malaise/fatigue. Negative for fever.  HENT: Positive for sore throat. Negative for congestion, ear pain and sinus pain.   Respiratory: Positive for cough. Negative for shortness of breath and wheezing.   Cardiovascular: Negative for chest pain and palpitations.  Neurological:  Negative for weakness and headaches.    Past Medical History:  Diagnosis Date  . Allergy   . Arthritis   . 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 Cancer ([place])   . Diabetes mellitus without complication ([place])   . GERD (gastroesophageal reflux disease)    occ  . Gout   . History of hiatal hernia   . HOH (hard of hearing)   . Hypertension   . Hypothyroidism   . Pneumonia    hx  . Thyroid disease   . Wears glasses    Past Surgical History:  Procedure Laterality Date  . BREAST BIOPSY Right 10/26/2013  . BREAST LUMPECTOMY Right 12/16/2013  . BREAST LUMPECTOMY WITH NEEDLE LOCALIZATION Right 12/16/2013   Procedure: BREAST LUMPECTOMY WITH NEEDLE LOCALIZATION;  Surgeon: Edward Jolly, MD;  Location: [place];  Service: General;  Laterality: Right;  . CATARACT EXTRACTION Left 12/30/2017  . CHOLECYSTECTOMY  1970  . COLONOSCOPY    . FINGER SURGERY     right hand-  . gallbladder sugery    . RE-EXCISION OF BREAST LUMPECTOMY Right 01/30/2014   Procedure: RE-EXCISION OF BREAST LUMPECTOMY;  Surgeon: Edward Jolly, MD;  Location: [facility];  Service: General;  Laterality: Right;  . RE-EXCISION OF BREAST LUMPECTOMY Right 03/06/2014   Procedure: RE-EXCISION OF BREAST LUMPECTOMY;  Surgeon: Edward Jolly, MD;  Location: [place];  Service: General;  Laterality: Right;  . SHOULDER ARTHROSCOPY  2011   left  .  THYROID SURGERY  1970  . TOTAL SHOULDER ARTHROPLASTY Right 05/21/2016  . TOTAL SHOULDER ARTHROPLASTY Right 05/21/2016   Procedure: RIGHT TOTAL SHOULDER ARTHROPLASTY;  Surgeon: Ninetta Lights, MD;  Location: [place];  Service: Orthopedics;  Laterality: Right;  . TUBAL LIGATION     Social History:   reports that she has never smoked. She has never used smokeless tobacco. She reports that she does not drink alcohol or use drugs.  Family History  Problem Relation Age of Onset  . Diabetes Mother   . Arthritis  Father   . Seizures Sister   . Cancer Sister        breast  . Breast cancer Sister   . Multiple sclerosis Son        Deceased     Medications: Patient's Medications  New Prescriptions   No medications on file  Previous Medications   ALLOPURINOL (ZYLOPRIM) 100 MG TABLET    TAKE 1 TABLET BY MOUTH ONCE DAILY FOR GOUT   AMLODIPINE (NORVASC) 5 MG TABLET    TAKE 1 TABLET BY MOUTH ONCE DAILY   FEEDING SUPPLEMENT, GLUCERNA SHAKE, (GLUCERNA SHAKE) LIQD    Take 237 mLs by mouth 2 (two) times daily between meals.   FEROSUL 325 (65 FE) MG TABLET    TAKE 1 TABLET BY MOUTH ONCE DAILY WITH BREAKFAST   GLIPIZIDE (GLUCOTROL XL) 5 MG 24 HR TABLET    take 1 tablet by mouth once daily with BREAKFAST   LEVOTHYROXINE (SYNTHROID, LEVOTHROID) 100 MCG TABLET    TAKE 1 TABLET BY MOUTH ONCE DAILY BEFORE BREAKFAST   LISINOPRIL (PRINIVIL,ZESTRIL) 20 MG TABLET    TAKE 1 TABLET BY MOUTH ONCE DAILY   TAMOXIFEN (NOLVADEX) 20 MG TABLET    Take 1 tablet (20 mg total) by mouth daily.   TRAMADOL (ULTRAM) 50 MG TABLET    TAKE 1 TABLET BY MOUTH THREE TIMES A DAY IF NEEDED FOR PAIN WITH FOOD   TRIAMCINOLONE CREAM (KENALOG) 0.1 %    Apply 1 application topically 2 (two) times daily.  Modified Medications   No medications on file  Discontinued Medications   No medications on file     Physical Exam:  Vitals:   04/22/18 0903  BP: 138/68  Pulse: 79  Temp: 98.1 F (36.7 C)  TempSrc: Oral  SpO2: 96%  Weight: 244 lb (110.7 kg)  Height: 5\' 2"  (1.575 m)   Body mass index is 44.63 kg/m.  Physical Exam  Constitutional: She is oriented to person, place, and time. She appears well-developed and well-nourished. No distress.  HENT:  Head: Normocephalic and atraumatic.  Mouth/Throat: Oropharynx is clear and moist. No oropharyngeal exudate.  Eyes: Pupils are equal, round, and reactive to light. Conjunctivae are normal.  Neck: Normal range of motion. Neck supple.  Cardiovascular: Normal rate, regular rhythm and normal  heart sounds.  Pulmonary/Chest: Effort normal. She has rhonchi in the right upper field and the right middle field.  Musculoskeletal: She exhibits no edema.  Neurological: She is alert and oriented to person, place, and time.  Skin: Skin is warm and dry. She is not diaphoretic.  Psychiatric: She has a normal mood and affect.    Labs reviewed: Basic Metabolic Panel: Recent Labs    05/19/17 1343 09/17/17 0922 01/18/18 1015  NA 143 140 141  K 4.8 4.4 4.5  CL 109 108 109  CO2 23 25 27   GLUCOSE 105* 94 106*  BUN 19 17 17   CREATININE 1.03* 1.04* 0.89*  CALCIUM 9.4  8.9 9.1  TSH 0.39* 1.46 0.83   Liver Function Tests: Recent Labs    05/19/17 1343 09/17/17 0922 01/18/18 1015  AST  --  13 14  ALT 8 6 9   BILITOT  --  0.4 0.3  PROT  --  6.2 6.2   No results for input(s): LIPASE, AMYLASE in the last 8760 hours. No results for input(s): AMMONIA in the last 8760 hours. CBC: Recent Labs    01/18/18 1015  WBC 4.6  NEUTROABS 2,208  HGB 11.5*  HCT 35.0  MCV 87.1  PLT 197   Lipid Panel: Recent Labs    05/19/17 1343 09/17/17 0922 01/18/18 1015  CHOL 154 142 158  HDL 60 57 53  LDLCALC 74 65 86  TRIG 98 121 94  CHOLHDL 2.6 2.5 3.0   TSH: Recent Labs    05/19/17 1343 09/17/17 0922 01/18/18 1015  TSH 0.39* 1.46 0.83   A1C: Lab Results  Component Value Date   HGBA1C 6.2 (H) 01/18/2018     Assessment/Plan 1. Bronchitis -increase fluid -mucinex DM by mouth twice daily x 1 week - doxycycline (VIBRA-TABS) 100 MG tablet; Take 1 tablet (100 mg total) by mouth 2 (two) times daily.  Dispense: 14 tablet; Refill: 0 -to take doxycycline with food and use probiotic twice daily - DG Chest 2 View- rule out pneumonia.   Return precautions discussed  Carlos American. [place], [facility] [phone]

## 2018-04-22 NOTE — Patient Instructions (Signed)
MUCINEX DM by mouth 1-2 tablets twice daily with FULL glass of water for 7 days Doxycycline 100 mg by mouth twice daily for 7 days.  Increase hydration  To get chest xray  Acute Bronchitis, Adult Acute bronchitis is when air tubes (bronchi) in the lungs suddenly get swollen. The condition can make it hard to breathe. It can also cause these symptoms:  A cough.  Coughing up clear, yellow, or green mucus.  Wheezing.  Chest congestion.  Shortness of breath.  A fever.  Body aches.  Chills.  A sore throat.  Follow these instructions at home: Medicines  Take over-the-counter and prescription medicines only as told by your doctor.  If you were prescribed an antibiotic medicine, take it as told by your doctor. Do not stop taking the antibiotic even if you start to feel better. General instructions  Rest.  Drink enough fluids to keep your pee (urine) clear or pale yellow.  Avoid smoking and secondhand smoke. If you smoke and you need help quitting, ask your doctor. Quitting will help your lungs heal faster.  Use an inhaler, cool mist vaporizer, or humidifier as told by your doctor.  Keep all follow-up visits as told by your doctor. This is important. How is this prevented? To lower your risk of getting this condition again:  Wash your hands often with soap and water. If you cannot use soap and water, use hand sanitizer.  Avoid contact with people who have cold symptoms.  Try not to touch your hands to your mouth, nose, or eyes.  Make sure to get the flu shot every year.  Contact a doctor if:  Your symptoms do not get better in 2 weeks. Get help right away if:  You cough up blood.  You have chest pain.  You have very bad shortness of breath.  You become dehydrated.  You faint (pass out) or keep feeling like you are going to pass out.  You keep throwing up (vomiting).  You have a very bad headache.  Your fever or chills gets worse. This information is not  intended to replace advice given to you by your health care provider. Make sure you discuss any questions you have with your health care provider. Document Released: 04/14/2008 Document Revised: 06/04/2016 Document Reviewed: 04/16/2016 Elsevier Interactive Patient Education  [employer].

## 2018-04-28 ENCOUNTER — Telehealth: Payer: Self-pay | Admitting: *Deleted

## 2018-04-28 ENCOUNTER — Other Ambulatory Visit: Payer: Self-pay | Admitting: Nurse Practitioner

## 2018-04-28 NOTE — Telephone Encounter (Signed)
Patient called and stated that she was seen on 04/22/2018 by Janett Billow and Dx with Bronchitis. Given antibiotic and almost finished with it. Tonight is her last dose of antibiotic. Has been doing the Mucinex and increased fluids. Complains of terrible cough, cant get rid of it. Not coughing up much, but when she does it is clear. Would like to know what she can do for the cough. Please Advise.

## 2018-04-28 NOTE — Telephone Encounter (Signed)
To take [place] DM by mouth twice daily with full glass of water - the DM will help with cough, cough can last even after the antibiotics are finished

## 2018-04-28 NOTE — Telephone Encounter (Signed)
Patient notified and agreed.  

## 2018-05-15 ENCOUNTER — Other Ambulatory Visit: Payer: Self-pay | Admitting: Internal Medicine

## 2018-05-16 ENCOUNTER — Other Ambulatory Visit: Payer: Self-pay | Admitting: Internal Medicine

## 2018-05-16 DIAGNOSIS — E119 Type 2 diabetes mellitus without complications: Secondary | ICD-10-CM

## 2018-05-17 ENCOUNTER — Other Ambulatory Visit: Payer: Self-pay | Admitting: Hematology and Oncology

## 2018-05-17 NOTE — Telephone Encounter (Signed)
A medication refill was received from pharmacy for tramadol 50 mg. Rx was called in to pharmacy after verifying last fill date, provider, and quantity on PMP [place] database.   

## 2018-05-21 ENCOUNTER — Other Ambulatory Visit: Payer: Medicare Other

## 2018-05-21 DIAGNOSIS — I10 Essential (primary) hypertension: Secondary | ICD-10-CM

## 2018-05-21 DIAGNOSIS — R600 Localized edema: Secondary | ICD-10-CM | POA: Diagnosis not present

## 2018-05-21 DIAGNOSIS — E034 Atrophy of thyroid (acquired): Secondary | ICD-10-CM | POA: Diagnosis not present

## 2018-05-21 DIAGNOSIS — E1122 Type 2 diabetes mellitus with diabetic chronic kidney disease: Secondary | ICD-10-CM

## 2018-05-21 DIAGNOSIS — N182 Chronic kidney disease, stage 2 (mild): Secondary | ICD-10-CM | POA: Diagnosis not present

## 2018-05-22 LAB — BASIC METABOLIC PANEL WITH GFR
BUN/Creatinine Ratio: 13 (calc) (ref 6–22)
BUN: 12 mg/dL (ref 7–25)
CO2: 26 mmol/L (ref 20–32)
Calcium: 9.1 mg/dL (ref 8.6–10.4)
Chloride: 109 mmol/L (ref 98–110)
Creat: 0.94 mg/dL — ABNORMAL HIGH (ref 0.60–0.88)
GFR, Est African American: 66 mL/min/{1.73_m2} (ref >=60)
GFR, Est Non African American: 57 mL/min/{1.73_m2} — ABNORMAL LOW (ref >=60)
Glucose, Bld: 100 mg/dL — ABNORMAL HIGH (ref 65–99)
Potassium: 5.1 mmol/L (ref 3.5–5.3)
Sodium: 141 mmol/L (ref 135–146)

## 2018-05-22 LAB — LIPID PANEL
Cholesterol: 164 mg/dL (ref <200)
HDL: 51 mg/dL (ref >50)
LDL Cholesterol (Calc): 91 mg/dL (calc)
Non-HDL Cholesterol (Calc): 113 mg/dL (calc) (ref <130)
Total CHOL/HDL Ratio: 3.2 (calc) (ref <5.0)
Triglycerides: 120 mg/dL (ref <150)

## 2018-05-22 LAB — HEMOGLOBIN A1C
Hgb A1c MFr Bld: 6.1 % of total Hgb — ABNORMAL HIGH (ref <5.7)
Mean Plasma Glucose: 128 (calc)
eAG (mmol/L): 7.1 (calc)

## 2018-05-22 LAB — ALT: ALT: 10 U/L (ref 6–29)

## 2018-05-22 LAB — T4, FREE: Free T4: 1.1 ng/dL (ref 0.8–1.8)

## 2018-05-22 LAB — TSH: TSH: 0.94 mIU/L (ref 0.40–4.50)

## 2018-05-25 ENCOUNTER — Encounter: Payer: Self-pay | Admitting: Internal Medicine

## 2018-05-25 ENCOUNTER — Ambulatory Visit (INDEPENDENT_AMBULATORY_CARE_PROVIDER_SITE_OTHER): Payer: Medicare Other | Admitting: Internal Medicine

## 2018-05-25 ENCOUNTER — Ambulatory Visit
Admission: RE | Admit: 2018-05-25 | Discharge: 2018-05-25 | Disposition: A | Discharge status: Home / Self Care | Payer: Medicare Other | Source: Ambulatory Visit | Attending: Internal Medicine | Admitting: Internal Medicine

## 2018-05-25 VITALS — BP 120/62 | HR 86 | Temp 98.7°F | Resp 18 | Ht 62.0 in | Wt 246.4 lb

## 2018-05-25 DIAGNOSIS — N182 Chronic kidney disease, stage 2 (mild): Secondary | ICD-10-CM

## 2018-05-25 DIAGNOSIS — E1122 Type 2 diabetes mellitus with diabetic chronic kidney disease: Secondary | ICD-10-CM | POA: Diagnosis not present

## 2018-05-25 DIAGNOSIS — R202 Paresthesia of skin: Secondary | ICD-10-CM

## 2018-05-25 DIAGNOSIS — M8949 Other hypertrophic osteoarthropathy, multiple sites: Secondary | ICD-10-CM

## 2018-05-25 DIAGNOSIS — M159 Polyosteoarthritis, unspecified: Secondary | ICD-10-CM

## 2018-05-25 DIAGNOSIS — M542 Cervicalgia: Secondary | ICD-10-CM | POA: Diagnosis not present

## 2018-05-25 DIAGNOSIS — E034 Atrophy of thyroid (acquired): Secondary | ICD-10-CM

## 2018-05-25 DIAGNOSIS — I10 Essential (primary) hypertension: Secondary | ICD-10-CM | POA: Diagnosis not present

## 2018-05-25 DIAGNOSIS — M150 Primary generalized (osteo)arthritis: Secondary | ICD-10-CM

## 2018-05-25 DIAGNOSIS — R600 Localized edema: Secondary | ICD-10-CM

## 2018-05-25 DIAGNOSIS — Z853 Personal history of malignant neoplasm of breast: Secondary | ICD-10-CM

## 2018-05-25 NOTE — Patient Instructions (Addendum)
Continue current medications as ordered  Will call with neck xray results  Follow up with Orthopedic as scheduled  Follow up in 4 mos for DM, HTN, thyroid, arthritis. Fasting labs prior to appt

## 2018-05-25 NOTE — Progress Notes (Signed)
Patient ID: Karla Willis, female   DOB: 1935/06/21, 83 y.o.   MRN: [id number]   Location:  [facility] OFFICE  Provider: DR Arletha Grippe  Code Status:  Goals of Care:  Advanced Directives 04/22/2018  Does Patient Have a Medical Advance Directive? Yes  Type of Paramedic of [place];Living will;Out of facility DNR (pink MOST or yellow form)  Does patient want to make changes to medical advance directive? -  Copy of [place] in Chart? Yes  Pre-existing out of facility DNR order (yellow form or pink MOST form) Yellow form placed in chart (order not valid for inpatient use)     Chief Complaint  Patient presents with  . Medical Management of Chronic Issues    4 mo- f/u- DM, HTN, joint pain,  thyroid    HPI: Patient is a 83 y.o. female seen today for medical management of chronic diseases.  Bronchitis improved. She completed doxy. She still has a cough. No f/c.  She has 2 week hx left neck ---> shoulder pain with associated numbness in her fingers. She has a throbbing sensation in left occiput and occurs intermittently. No known trauma. Last xray of left shoulder in 2007 revealed marked degenerative changes  DM - BS at home stable. She does not check daily.  No low BS reactions. She takes glipizide xl. She has some swelling in L>R foot. She stopped actos. Occasional  numbness or tingling. A1c 6.1%. LDL 86; urine microalbumin/Cr ratio <0.2.  Leg cramps - improved. she had to stop gabapentin due to next day dizziness. She is taking tramadol prn which helps  HTN - BP stable on amlodipine, lisinopril. She noticed more swelling in legs/feet off HCTZ. Cr 0.89  Thyroid - stable on levothyroxine. TSH 0.94; T4 free 1.1  Arthritis - stable on tramadol. Takes allopurinol for gout. No recent gout attacks. She is followed by [facility] Cl[facility] and [facility]. Last left hip injection was in Mar 2017. Uric acid level 4.5  Hx right breast CA - followed by oncology Dr  [name]. Last mammogram in Dec 2017 was benign. She takes tamoxifen and has occasional night sweats and hot flashes. No signs of recurrence  Weight loss - improved. albumin 3.5. She gets glucerna intermittently. Appetite improved since family moved into larger home. Weight stable.   Past Medical History:  Diagnosis Date  . Allergy   . Arthritis   . 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 Cancer ([place])   . Diabetes mellitus without complication ([place])   . GERD (gastroesophageal reflux disease)    occ  . Gout   . History of hiatal hernia   . HOH (hard of hearing)   . Hypertension   . Hypothyroidism   . Pneumonia    hx  . Thyroid disease   . Wears glasses     Past Surgical History:  Procedure Laterality Date  . BREAST BIOPSY Right 10/26/2013  . BREAST LUMPECTOMY Right 12/16/2013  . BREAST LUMPECTOMY WITH NEEDLE LOCALIZATION Right 12/16/2013   Procedure: BREAST LUMPECTOMY WITH NEEDLE LOCALIZATION;  Surgeon: Edward Jolly, MD;  Location: [place];  Service: General;  Laterality: Right;  . CATARACT EXTRACTION Left 12/30/2017  . CHOLECYSTECTOMY  1970  . COLONOSCOPY    . FINGER SURGERY     right hand-  . gallbladder sugery    . RE-EXCISION OF BREAST LUMPECTOMY Right 01/30/2014   Procedure: RE-EXCISION OF BREAST LUMPECTOMY;  Surgeon: Marland Kitchen T  Hoxworth, MD;  Location: [facility];  Service: General;  Laterality: Right;  . RE-EXCISION OF BREAST LUMPECTOMY Right 03/06/2014   Procedure: RE-EXCISION OF BREAST LUMPECTOMY;  Surgeon: Edward Jolly, MD;  Location: [place];  Service: General;  Laterality: Right;  . SHOULDER ARTHROSCOPY  2011   left  . THYROID SURGERY  1970  . TOTAL SHOULDER ARTHROPLASTY Right 05/21/2016  . TOTAL SHOULDER ARTHROPLASTY Right 05/21/2016   Procedure: RIGHT TOTAL SHOULDER ARTHROPLASTY;  Surgeon: Ninetta Lights, MD;  Location: [place];  Service: Orthopedics;  Laterality: Right;  . TUBAL  LIGATION       reports that she has never smoked. She has never used smokeless tobacco. She reports that she does not drink alcohol or use drugs. Social History   Socioeconomic History  . Marital status: Widowed    Spouse name: Not on file  . Number of children: Not on file  . Years of education: Not on file  . Highest education level: Not on file  Occupational History  . Not on file  Social Needs  . Financial resource strain: Not hard at all  . Food insecurity:    Worry: Never true    Inability: Never true  . Transportation needs:    Medical: No    Non-medical: No  Tobacco Use  . Smoking status: Never Smoker  . Smokeless tobacco: Never Used  Substance and Sexual Activity  . Alcohol use: No    Alcohol/week: 0.0 oz  . Drug use: No  . Sexual activity: Not Currently    Comment: menarche age 82, G56, P36, first live birth age 86, menopause at 30, no HRT  Lifestyle  . Physical activity:    Days per week: 0 days    Minutes per session: 0 min  . Stress: Not at all  Relationships  . Social connections:    Talks on phone: More than three times a week    Gets together: More than three times a week    Attends religious service: More than 4 times per year    Active member of club or organization: No    Attends meetings of clubs or organizations: Never    Relationship status: Widowed  . Intimate partner violence:    Fear of current or ex partner: No    Emotionally abused: No    Physically abused: No    Forced sexual activity: No  Other Topics Concern  . Not on file  Social History Narrative   Diet: No      Do you drink/ eat things with caffeine? Yes      Marital status:    Widowed                           What year were you married ?  1956      Do you live in a house, apartment,assistred living, condo, trailer, etc.)?  apartment      Is it one or more stories?  one      How many persons live in your home ?  3      Do you have any pets in your home ?(please list)  No        Current or past profession:  Bank, Caregiver      Do you exercise?  No  Type & how often:        Do you have a living will?  Yes      Do you have a DNR form?  Yes                     If not, do you want to discuss one?       Do you have signed POA?HPOA forms?                 If so, please bring to your        appointment       Family History  Problem Relation Age of Onset  . Diabetes Mother   . Arthritis Father   . Seizures Sister   . Cancer Sister        breast  . Breast cancer Sister   . Multiple sclerosis Son        Deceased     Allergies  Allergen Reactions  . Actos [Pioglitazone] Swelling  . Hydrocodone Nausea Only and Other (See Comments)    "bloated"  . Codeine Rash  . Neurontin [Gabapentin] Palpitations and Other (See Comments)    dizziness  . Other Rash    Nuts--PECANS  . Peanut-Containing Drug Products Rash    Outpatient Encounter Medications as of 05/25/2018  Medication Sig  . allopurinol (ZYLOPRIM) 100 MG tablet TAKE 1 TABLET BY MOUTH ONCE DAILY FOR GOUT  . amLODipine (NORVASC) 5 MG tablet TAKE 1 TABLET BY MOUTH EVERY DAY  . feeding supplement, GLUCERNA SHAKE, (GLUCERNA SHAKE) LIQD Take 237 mLs by mouth 2 (two) times daily between meals.  . FEROSUL 325 (65 Fe) MG tablet TAKE 1 TABLET BY MOUTH ONCE DAILY WITH BREAKFAST  . glipiZIDE (GLUCOTROL XL) 5 MG 24 hr tablet TAKE 1 TABLET BY MOUTH ONCE DAILY WITH BREAKFAST  . levothyroxine (SYNTHROID, LEVOTHROID) 100 MCG tablet TAKE 1 TABLET BY MOUTH ONCE DAILY BEFORE BREAKFAST  . lisinopril (PRINIVIL,ZESTRIL) 20 MG tablet TAKE 1 TABLET BY MOUTH ONCE DAILY  . tamoxifen (NOLVADEX) 20 MG tablet TAKE 1 TABLET BY MOUTH ONCE DAILY  . traMADol (ULTRAM) 50 MG tablet TAKE 1 TABLET BY MOUTH 3 TIMES DAILY AS NEEDED FOR PAIN  . triamcinolone cream (KENALOG) 0.1 % Apply 1 application topically 2 (two) times daily.  . [DISCONTINUED] doxycycline (VIBRA-TABS) 100 MG tablet Take 1 tablet (100 mg  total) by mouth 2 (two) times daily.   No facility-administered encounter medications on file as of 05/25/2018.     Review of Systems:  Review of Systems  Respiratory: Positive for cough.   Musculoskeletal: Positive for arthralgias and neck pain.  Neurological: Positive for numbness.  All other systems reviewed and are negative.   Health Maintenance  Topic Date Due  . INFLUENZA VACCINE  06/10/2018  . HEMOGLOBIN A1C  11/21/2018  . OPHTHALMOLOGY EXAM  12/31/2018  . FOOT EXAM  01/23/2019  . TETANUS/TDAP  04/18/2026  . DEXA SCAN  Completed  . PNA vac Low Risk Adult  Completed    Physical Exam: Vitals:   05/25/18 1426  BP: 120/62  Pulse: 86  Resp: 18  Temp: 98.7 F (37.1 C)  TempSrc: Oral  SpO2: 94%  Weight: 246 lb 6.4 oz (111.8 kg)  Height: 5\' 2"  (1.575 m)   Body mass index is 45.07 kg/m. Physical Exam  Constitutional: She is oriented to person, place, and time. She appears well-developed and well-nourished.  HENT:  Mouth/Throat: Oropharynx is clear and moist. No oropharyngeal exudate.  MMM; no oral thrush  Eyes: Pupils are equal, round, and reactive to light. No scleral icterus.  Neck: Neck supple. Muscular tenderness present. Carotid bruit is not present. No neck rigidity. Decreased range of motion present. No tracheal deviation, no edema and no erythema present. No thyromegaly present.    Cardiovascular: Normal rate, regular rhythm and intact distal pulses. Exam reveals no gallop and no friction rub.  Murmur (1/6 SEM) heard. Trace LE edema b/l. No calf TTP  Pulmonary/Chest: Effort normal and breath sounds normal. No stridor. No respiratory distress. She has no wheezes. She has no rales.  Abdominal: Soft. Normal appearance and bowel sounds are normal. She exhibits no distension and no mass. There is no hepatomegaly. There is no tenderness. There is no rigidity, no rebound and no guarding. No hernia.  obese  Musculoskeletal: She exhibits edema (small and large  joints).  Lymphadenopathy:    She has no cervical adenopathy.  Neurological: She is alert and oriented to person, place, and time. She has normal reflexes.  Skin: Skin is warm and dry. No rash noted.  Psychiatric: She has a normal mood and affect. Her behavior is normal. Judgment and thought content normal.   Diabetic Foot Exam - Simple   Simple Foot Form Diabetic Foot exam was performed with the following findings:  Yes 05/25/2018  3:10 PM  Visual Inspection See comments:  Yes Sensation Testing Intact to touch and monofilament testing bilaterally:  Yes Pulse Check Posterior Tibialis and Dorsalis pulse intact bilaterally:  Yes Comments Hammertoes present; no calluses or ulcerations    Labs reviewed: Basic Metabolic Panel: Recent Labs    09/17/17 0922 01/18/18 1015 05/21/18 0821  NA 140 141 141  K 4.4 4.5 5.1  CL 108 109 109  CO2 25 27 26   GLUCOSE 94 106* 100*  BUN 17 17 12   CREATININE 1.04* 0.89* 0.94*  CALCIUM 8.9 9.1 9.1  TSH 1.46 0.83 0.94   Liver Function Tests: Recent Labs    09/17/17 0922 01/18/18 1015 05/21/18 0821  AST 13 14  --   ALT 6 9 10   BILITOT 0.4 0.3  --   PROT 6.2 6.2  --    No results for input(s): LIPASE, AMYLASE in the last 8760 hours. No results for input(s): AMMONIA in the last 8760 hours. CBC: Recent Labs    01/18/18 1015  WBC 4.6  NEUTROABS 2,208  HGB 11.5*  HCT 35.0  MCV 87.1  PLT 197   Lipid Panel: Recent Labs    09/17/17 0922 01/18/18 1015 05/21/18 0821  CHOL 142 158 164  HDL 57 53 51  LDLCALC 65 86 91  TRIG 121 94 120  CHOLHDL 2.5 3.0 3.2   Lab Results  Component Value Date   HGBA1C 6.1 (H) 05/21/2018    Procedures since last visit: No results found.  Assessment/Plan     ICD-10-CM   1. Neck pain on left side M54.2 DG Cervical Spine Complete  2. Primary osteoarthritis involving multiple joints M15.0 DG Cervical Spine Complete  3. Type 2 diabetes mellitus with stage 2 chronic kidney disease, without  long-term current use of insulin (HCC) E11.22    N18.2   4. Hypothyroidism due to acquired atrophy of thyroid E03.4   5. Essential hypertension, benign I10   6. Bilateral edema of lower extremity R60.0   7. History of breast cancer Z85.3   8. Paresthesias in left hand R20.2 DG Cervical Spine Complete   Continue current medications as ordered  Will call  with neck xray results  Follow up with Orthopedic as scheduled  Follow up in 4 mos for DM, HTN, thyroid, arthritis. Fasting labs prior to appt (cmp, lipid panel, a1c, tsh)     Zakyria Metzinger S. Perlie Gold  [facility] [address] [place], [place] [address] [phone] Cell (Monday-Friday 8 AM - 5 PM) [phone] After 5 PM and follow prompts

## 2018-05-28 ENCOUNTER — Encounter: Payer: Self-pay | Admitting: Internal Medicine

## 2018-06-01 ENCOUNTER — Other Ambulatory Visit: Payer: Self-pay | Admitting: Internal Medicine

## 2018-06-08 ENCOUNTER — Encounter: Payer: Self-pay | Admitting: Internal Medicine

## 2018-06-08 ENCOUNTER — Ambulatory Visit (INDEPENDENT_AMBULATORY_CARE_PROVIDER_SITE_OTHER): Payer: Medicare Other | Admitting: Internal Medicine

## 2018-06-08 VITALS — BP 120/68 | HR 84 | Temp 98.2°F | Resp 20 | Ht 62.0 in | Wt 248.2 lb

## 2018-06-08 DIAGNOSIS — M150 Primary generalized (osteo)arthritis: Secondary | ICD-10-CM

## 2018-06-08 DIAGNOSIS — S39012A Strain of muscle, fascia and tendon of lower back, initial encounter: Secondary | ICD-10-CM | POA: Diagnosis not present

## 2018-06-08 DIAGNOSIS — M62830 Muscle spasm of back: Secondary | ICD-10-CM

## 2018-06-08 DIAGNOSIS — M8949 Other hypertrophic osteoarthropathy, multiple sites: Secondary | ICD-10-CM

## 2018-06-08 DIAGNOSIS — M159 Polyosteoarthritis, unspecified: Secondary | ICD-10-CM

## 2018-06-08 MED ORDER — METHYLPREDNISOLONE 4 MG PO TBPK: ORAL_TABLET | ORAL | 0 refills | Status: DC

## 2018-06-08 MED ORDER — METHYLPREDNISOLONE ACETATE 40 MG/ML IJ SUSP
40.0000 mg | Freq: Once | INTRAMUSCULAR | Status: AC
  Administered 2018-06-08: 40 mg via INTRAMUSCULAR

## 2018-06-08 NOTE — Patient Instructions (Signed)
DEPO-MEDROL 40MG  INJECTION GIVEN TODAY  START MEDROL DOSEPAK IN THE MORNING AS DIRECTED  May apply alternating warm and cold compresses to lower back as needed for pain  Continue tramadol and tylenol as needed for pain  AVOID HEAVY LIFTING, PUSHING, PULLING OR STOOPING X 3 DAYS  Follow up as scheduled or sooner if need be    Lumbosacral Strain Lumbosacral strain is an injury that causes pain in the lower back (lumbosacral spine). This injury usually occurs from overstretching the muscles or ligaments along your spine. A strain can affect one or more muscles or cord-like tissues that connect bones to other bones (ligaments). What are the causes? This condition may be caused by:  A hard, direct hit (blow) to the back.  Excessive stretching of the lower back muscles. This may result from: ? A fall. ? Lifting something heavy. ? Repetitive movements such as bending or crouching.  What increases the risk? The following factors may increase your risk of getting this condition:  Participating in sports or activities that involve: ? A sudden twist of the back. ? Pushing or pulling motions.  Being overweight or obese.  Having poor strength and flexibility, especially tight hamstrings or weak muscles in the back or abdomen.  Having too much of a curve in the lower back.  Having a pelvis that is tilted forward.  What are the signs or symptoms? The main symptom of this condition is pain in the lower back, at the site of the strain. Pain may extend (radiate) down one or both legs. How is this diagnosed? This condition is diagnosed based on:  Your symptoms.  Your medical history.  A physical exam. ? Your health care provider may push on certain areas of your back to determine the source of your pain. ? You may be asked to bend forward, backward, and side to side to assess the severity of your pain and your range of motion.  Imaging tests, such as: ? X-rays. ? MRI.  How is  this treated? Treatment for this condition may include:  Putting heat and cold on the affected area.  Medicines to help relieve pain and relax your muscles (muscle relaxants).  NSAIDs to help reduce swelling and discomfort.  When your symptoms improve, it is important to gradually return to your normal routine as soon as possible to reduce pain, avoid stiffness, and avoid loss of muscle strength. Generally, symptoms should improve within 6 weeks of treatment. However, recovery time varies. Follow these instructions at home: Managing pain, stiffness, and swelling   If directed, put ice on the injured area during the first 24 hours after your strain. ? Put ice in a plastic bag. ? Place a towel between your skin and the bag. ? Leave the ice on for 20 minutes, 2-3 times a day.  If directed, put heat on the affected area as often as told by your health care provider. Use the heat source that your health care provider recommends, such as a moist heat pack or a heating pad. ? Place a towel between your skin and the heat source. ? Leave the heat on for 20-30 minutes. ? Remove the heat if your skin turns bright red. This is especially important if you are unable to feel pain, heat, or cold. You may have a greater risk of getting burned. Activity  Rest and return to your normal activities as told by your health care provider. Ask your health care provider what activities are safe for you.  Avoid activities that take a lot of energy for as long as told by your health care provider. General instructions  Take over-the-counter and prescription medicines only as told by your health care provider.  Donot drive or use heavy machinery while taking prescription pain medicine.  Do not use any products that contain nicotine or tobacco, such as cigarettes and e-cigarettes. If you need help quitting, ask your health care provider.  Keep all follow-up visits as told by your health care provider. This  is important. How is this prevented?  Use correct form when playing sports and lifting heavy objects.  Use good posture when sitting and standing.  Maintain a healthy weight.  Sleep on a mattress with medium firmness to support your back.  Be safe and responsible while being active to avoid falls.  Do at least 150 minutes of moderate-intensity exercise each week, such as brisk walking or water aerobics. Try a form of exercise that takes stress off your back, such as swimming or stationary cycling.  Maintain physical fitness, including: ? Strength. ? Flexibility. ? Cardiovascular fitness. ? Endurance. Contact a health care provider if:  Your back pain does not improve after 6 weeks of treatment.  Your symptoms get worse. Get help right away if:  Your back pain is severe.  You cannot stand or walk.  You have difficulty controlling when you urinate or when you have a bowel movement.  You feel nauseous or you vomit.  Your feet get very cold.  You have numbness, tingling, weakness, or problems using your arms or legs.  You develop any of the following: ? Shortness of breath. ? Dizziness. ? Pain in your legs. ? Weakness in your buttocks or legs. ? Discoloration of the skin on your toes or legs. This information is not intended to replace advice given to you by your health care provider. Make sure you discuss any questions you have with your health care provider. Document Released: 08/06/2005 Document Revised: 05/16/2016 Document Reviewed: 03/30/2016 Elsevier Interactive Patient Education  [employer].

## 2018-06-08 NOTE — Progress Notes (Signed)
Patient ID: Karla Willis, female   DOB: 09-21-1935, 83 y.o.   MRN: [id number]   [facility] OFFICE  Provider: DR Arletha Grippe  Code Status:  Goals of Care:  Advanced Directives 04/22/2018  Does Patient Have a Medical Advance Directive? Yes  Type of Paramedic of [place];Living will;Out of facility DNR (pink MOST or yellow form)  Does patient want to make changes to medical advance directive? -  Copy of [place] in Chart? Yes  Pre-existing out of facility DNR order (yellow form or pink MOST form) Yellow form placed in chart (order not valid for inpatient use)     Chief Complaint  Patient presents with  . Acute Visit    upper back pain x 4 days    HPI: Patient is a 83 y.o. female seen today for an acute visit for 4 day hx back pain. She baked a peach cobbler and a pound cake prior to onset of pain. No relief with tramadol or tylenol. No numbness/tingling. No falls. She continues to have left shoulder and neck pain. No falls. Her granddaughter is present. No loss of bowel/bladder control  Past Medical History:  Diagnosis Date  . Allergy   . Arthritis   . Breast cancer ([place]) 10/24/13   right upper inner- DCIS  . Cancer ([place])   . Diabetes mellitus without complication ([place])   . GERD (gastroesophageal reflux disease)    occ  . Gout   . History of hiatal hernia   . HOH (hard of hearing)   . Hypertension   . Hypothyroidism   . Pneumonia    hx  . Thyroid disease   . Wears glasses     Past Surgical History:  Procedure Laterality Date  . BREAST BIOPSY Right 10/26/2013  . BREAST LUMPECTOMY Right 12/16/2013  . BREAST LUMPECTOMY WITH NEEDLE LOCALIZATION Right 12/16/2013   Procedure: BREAST LUMPECTOMY WITH NEEDLE LOCALIZATION;  Surgeon: Edward Jolly, MD;  Location: [place];  Service: General;  Laterality: Right;  . CATARACT EXTRACTION Left 12/30/2017  . CHOLECYSTECTOMY  1970  . COLONOSCOPY    . FINGER SURGERY       right hand-  . gallbladder sugery    . RE-EXCISION OF BREAST LUMPECTOMY Right 01/30/2014   Procedure: RE-EXCISION OF BREAST LUMPECTOMY;  Surgeon: Edward Jolly, MD;  Location: [place];  Service: General;  Laterality: Right;  . RE-EXCISION OF BREAST LUMPECTOMY Right 03/06/2014   Procedure: RE-EXCISION OF BREAST LUMPECTOMY;  Surgeon: Edward Jolly, MD;  Location: [place];  Service: General;  Laterality: Right;  . SHOULDER ARTHROSCOPY  2011   left  . THYROID SURGERY  1970  . TOTAL SHOULDER ARTHROPLASTY Right 05/21/2016  . TOTAL SHOULDER ARTHROPLASTY Right 05/21/2016   Procedure: RIGHT TOTAL SHOULDER ARTHROPLASTY;  Surgeon: Ninetta Lights, MD;  Location: [place];  Service: Orthopedics;  Laterality: Right;  . TUBAL LIGATION       reports that she has never smoked. She has never used smokeless tobacco. She reports that she does not drink alcohol or use drugs. Social History   Socioeconomic History  . Marital status: Widowed    Spouse name: Not on file  . Number of children: Not on file  . Years of education: Not on file  . Highest education level: Not on file  Occupational History  . Not on file  Social Needs  . Financial resource strain: Not hard at all  . Food  insecurity:    Worry: Never true    Inability: Never true  . Transportation needs:    Medical: No    Non-medical: No  Tobacco Use  . Smoking status: Never Smoker  . Smokeless tobacco: Never Used  Substance and Sexual Activity  . Alcohol use: No    Alcohol/week: 0.0 oz  . Drug use: No  . Sexual activity: Not Currently    Comment: menarche age 52, G3, P17, first live birth age 79, menopause at 37, no HRT  Lifestyle  . Physical activity:    Days per week: 0 days    Minutes per session: 0 min  . Stress: Not at all  Relationships  . Social connections:    Talks on phone: More than three times a week    Gets together: More than three times a week    Attends religious  service: More than 4 times per year    Active member of club or organization: No    Attends meetings of clubs or organizations: Never    Relationship status: Widowed  . Intimate partner violence:    Fear of current or ex partner: No    Emotionally abused: No    Physically abused: No    Forced sexual activity: No  Other Topics Concern  . Not on file  Social History Narrative   Diet: No      Do you drink/ eat things with caffeine? Yes      Marital status:    Widowed                           What year were you married ?  1956      Do you live in a house, apartment,assistred living, condo, trailer, etc.)?  apartment      Is it one or more stories?  one      How many persons live in your home ?  3      Do you have any pets in your home ?(please list)  No      Current or past profession:  Bank, Caregiver      Do you exercise?  No                            Type & how often:        Do you have a living will?  Yes      Do you have a DNR form?  Yes                     If not, do you want to discuss one?       Do you have signed POA?HPOA forms?                 If so, please bring to your        appointment       Family History  Problem Relation Age of Onset  . Diabetes Mother   . Arthritis Father   . Seizures Sister   . Cancer Sister        breast  . Breast cancer Sister   . Multiple sclerosis Son        Deceased     Allergies  Allergen Reactions  . Actos [Pioglitazone] Swelling  . Hydrocodone Nausea Only and Other (See Comments)    "bloated"  . Codeine Rash  . Neurontin [  Gabapentin] Palpitations and Other (See Comments)    dizziness  . Other Rash    Nuts--PECANS  . Peanut-Containing Drug Products Rash    Outpatient Encounter Medications as of 06/08/2018  Medication Sig  . allopurinol (ZYLOPRIM) 100 MG tablet TAKE 1 TABLET BY MOUTH ONCE DAILY FOR GOUT  . amLODipine (NORVASC) 5 MG tablet TAKE 1 TABLET BY MOUTH EVERY DAY  . feeding supplement, GLUCERNA SHAKE,  (GLUCERNA SHAKE) LIQD Take 237 mLs by mouth 2 (two) times daily between meals.  . FEROSUL 325 (65 Fe) MG tablet TAKE 1 TABLET BY MOUTH ONCE DAILY WITH BREAKFAST  . glipiZIDE (GLUCOTROL XL) 5 MG 24 hr tablet TAKE 1 TABLET BY MOUTH ONCE DAILY WITH BREAKFAST  . levothyroxine (SYNTHROID, LEVOTHROID) 100 MCG tablet TAKE 1 TABLET BY MOUTH ONCE DAILY BEFORE BREAKFAST  . lisinopril (PRINIVIL,ZESTRIL) 20 MG tablet TAKE 1 TABLET BY MOUTH ONCE DAILY  . tamoxifen (NOLVADEX) 20 MG tablet TAKE 1 TABLET BY MOUTH ONCE DAILY  . traMADol (ULTRAM) 50 MG tablet TAKE 1 TABLET BY MOUTH 3 TIMES DAILY AS NEEDED FOR PAIN  . triamcinolone cream (KENALOG) 0.1 % Apply 1 application topically 2 (two) times daily.   No facility-administered encounter medications on file as of 06/08/2018.     Review of Systems:  Review of Systems  Musculoskeletal: Positive for arthralgias, back pain, gait problem and neck pain.  All other systems reviewed and are negative.   Health Maintenance  Topic Date Due  . INFLUENZA VACCINE  06/10/2018  . HEMOGLOBIN A1C  11/21/2018  . OPHTHALMOLOGY EXAM  12/31/2018  . FOOT EXAM  05/26/2019  . TETANUS/TDAP  04/18/2026  . DEXA SCAN  Completed  . PNA vac Low Risk Adult  Completed    Physical Exam: Vitals:   06/08/18 0926  BP: 120/68  Pulse: 84  Resp: 20  Temp: 98.2 F (36.8 C)  TempSrc: Oral  SpO2: 93%  Weight: 248 lb 3.2 oz (112.6 kg)  Height: 5\' 2"  (1.575 m)   Body mass index is 45.4 kg/m. Physical Exam  Constitutional: She is oriented to person, place, and time. She appears well-developed and well-nourished.  Musculoskeletal: She exhibits edema and tenderness.       Lumbar back: She exhibits decreased range of motion, tenderness and spasm.       Back:  Neurological: She is alert and oriented to person, place, and time.  Skin: Skin is warm and dry. No rash noted.  Psychiatric: She has a normal mood and affect. Her behavior is normal. Judgment and thought content normal.     Labs reviewed: Basic Metabolic Panel: Recent Labs    09/17/17 0922 01/18/18 1015 05/21/18 0821  NA 140 141 141  K 4.4 4.5 5.1  CL 108 109 109  CO2 25 27 26   GLUCOSE 94 106* 100*  BUN 17 17 12   CREATININE 1.04* 0.89* 0.94*  CALCIUM 8.9 9.1 9.1  TSH 1.46 0.83 0.94   Liver Function Tests: Recent Labs    09/17/17 0922 01/18/18 1015 05/21/18 0821  AST 13 14  --   ALT 6 9 10   BILITOT 0.4 0.3  --   PROT 6.2 6.2  --    No results for input(s): LIPASE, AMYLASE in the last 8760 hours. No results for input(s): AMMONIA in the last 8760 hours. CBC: Recent Labs    01/18/18 1015  WBC 4.6  NEUTROABS 2,208  HGB 11.5*  HCT 35.0  MCV 87.1  PLT 197   Lipid Panel: Recent Labs  09/17/17 0922 01/18/18 1015 05/21/18 0821  CHOL 142 158 164  HDL 57 53 51  LDLCALC 65 86 91  TRIG 121 94 120  CHOLHDL 2.5 3.0 3.2   Lab Results  Component Value Date   HGBA1C 6.1 (H) 05/21/2018    Procedures since last visit: Dg Cervical Spine Complete  Result Date: 05/26/2018 CLINICAL DATA:  Left-sided neck pain.  Left hand paresthesias. EXAM: CERVICAL SPINE - COMPLETE 4+ VIEW COMPARISON:  None. FINDINGS: There is severe degenerative disc disease from C3-4 through C6-7 with disc space narrowing and anterior and posterior osteophyte formation. Alignment is normal. Prevertebral soft tissues are normal. Slight diffuse bilateral facet arthritis without severe foraminal stenosis. Moderate degenerative changes between the anterior arch of C1 and the odontoid process of C2. Prevertebral soft tissues are normal. There is slight deviation of the trachea to the left at the T1 level. Does the patient have enlargement of the right lobe of the thyroid gland? IMPRESSION: Diffuse degenerative disc and joint disease in the cervical spine as described. Electronically Signed   By: Lorriane Shire M.D.   On: 05/26/2018 09:00    Assessment/Plan   ICD-10-CM   1. Strain of lumbar region, initial encounter  S39.012A methylPREDNISolone (MEDROL DOSEPAK) 4 MG TBPK tablet    methylPREDNISolone acetate (DEPO-MEDROL) injection 40 mg  2. Spasm of muscle of lower back M62.830   3. Primary osteoarthritis involving multiple joints M15.0    DEPO-MEDROL 40MG  INJECTION GIVEN TODAY  START MEDROL DOSEPAK IN THE MORNING AS DIRECTED  May apply alternating warm and cold compresses to lower back as needed for pain  Continue tramadol and tylenol as needed for pain  AVOID HEAVY LIFTING, PUSHING, PULLING OR STOOPING X 3 DAYS  Follow up as scheduled or sooner if need be    Sheffield Hawker S. Perlie Gold  [facility] [address] [place], [place] [address] [phone] Cell (Monday-Friday 8 AM - 5 PM) [phone] After 5 PM and follow prompts

## 2018-06-11 ENCOUNTER — Encounter: Payer: Self-pay | Admitting: Internal Medicine

## 2018-06-14 ENCOUNTER — Telehealth: Payer: Self-pay | Admitting: *Deleted

## 2018-06-14 NOTE — Telephone Encounter (Signed)
Patient called and stated that she finished the Prednisone today and still has back pain. Stated that it was not as bad but still hurts. Patient stated that you mentioned a patch she could try. Patient is wanting to try it. Please Advise.

## 2018-06-15 MED ORDER — LIDOCAINE 5 % EX PTCH: 1.0000 | MEDICATED_PATCH | CUTANEOUS | 0 refills | Status: DC

## 2018-06-15 NOTE — Telephone Encounter (Signed)
Rx for lidoderm patch 5% sent to the pharmacy. Patient was notified.

## 2018-06-15 NOTE — Telephone Encounter (Signed)
Only Lidoderm Patch 5% is available through the medication list. Is this ok to send to pharmacy. Please Advise.

## 2018-06-15 NOTE — Telephone Encounter (Signed)
Ok for [employer] patch 4% #30 apply TD to area of concern for 12 hrs and off for 12 hrs for pain with 2 RF

## 2018-06-15 NOTE — Telephone Encounter (Signed)
Yes 5% is fine. Thanks

## 2018-06-21 ENCOUNTER — Telehealth: Payer: Self-pay | Admitting: *Deleted

## 2018-06-21 NOTE — Telephone Encounter (Signed)
Patient called and stated that the Lidoderm Patch is not covered by insurance.   Initiated Prior Authorization through [facility].  Key: [id number]  [employer] is reviewing request and determination in 48-72 hours.

## 2018-06-22 NOTE — Telephone Encounter (Signed)
Denied by [facility]. Will need to pay out of pocket.

## 2018-06-22 NOTE — Telephone Encounter (Signed)
LMOM to return call.

## 2018-06-22 NOTE — Telephone Encounter (Signed)
Patient is wondering since the Lidoderm was denied by insurance if there is something else you suggest. Please Advise.

## 2018-06-22 NOTE — Telephone Encounter (Signed)
Recommend PT eval and tx

## 2018-06-25 NOTE — Telephone Encounter (Signed)
Noted  

## 2018-06-25 NOTE — Telephone Encounter (Signed)
Patient notified. Stated that she cannot do PT because she has to pay $40 every time she has it and she can't afford that.

## 2018-06-30 ENCOUNTER — Encounter: Payer: Self-pay | Admitting: Internal Medicine

## 2018-08-10 DIAGNOSIS — Z961 Presence of intraocular lens: Secondary | ICD-10-CM | POA: Diagnosis not present

## 2018-08-10 DIAGNOSIS — H40013 Open angle with borderline findings, low risk, bilateral: Secondary | ICD-10-CM | POA: Diagnosis not present

## 2018-08-10 DIAGNOSIS — E119 Type 2 diabetes mellitus without complications: Secondary | ICD-10-CM | POA: Diagnosis not present

## 2018-08-10 LAB — HM DIABETES EYE EXAM

## 2018-08-11 ENCOUNTER — Other Ambulatory Visit: Payer: Self-pay | Admitting: Internal Medicine

## 2018-08-12 NOTE — Telephone Encounter (Signed)
[place] Database verified and compliance confirmed   

## 2018-08-14 ENCOUNTER — Other Ambulatory Visit: Payer: Self-pay | Admitting: Internal Medicine

## 2018-08-20 ENCOUNTER — Encounter: Payer: Self-pay | Admitting: *Deleted

## 2018-09-27 ENCOUNTER — Other Ambulatory Visit: Payer: Self-pay | Admitting: Hematology and Oncology

## 2018-09-28 ENCOUNTER — Ambulatory Visit: Payer: Medicare Other | Admitting: Nurse Practitioner

## 2018-10-04 ENCOUNTER — Ambulatory Visit (INDEPENDENT_AMBULATORY_CARE_PROVIDER_SITE_OTHER): Payer: Medicare Other | Admitting: Nurse Practitioner

## 2018-10-04 ENCOUNTER — Encounter: Payer: Self-pay | Admitting: Nurse Practitioner

## 2018-10-04 VITALS — BP 124/72 | HR 76 | Temp 98.6°F | Ht 62.0 in | Wt 247.0 lb

## 2018-10-04 DIAGNOSIS — E034 Atrophy of thyroid (acquired): Secondary | ICD-10-CM | POA: Diagnosis not present

## 2018-10-04 DIAGNOSIS — L309 Dermatitis, unspecified: Secondary | ICD-10-CM | POA: Diagnosis not present

## 2018-10-04 DIAGNOSIS — Z853 Personal history of malignant neoplasm of breast: Secondary | ICD-10-CM

## 2018-10-04 DIAGNOSIS — E785 Hyperlipidemia, unspecified: Secondary | ICD-10-CM

## 2018-10-04 DIAGNOSIS — N182 Chronic kidney disease, stage 2 (mild): Secondary | ICD-10-CM

## 2018-10-04 DIAGNOSIS — M150 Primary generalized (osteo)arthritis: Secondary | ICD-10-CM | POA: Diagnosis not present

## 2018-10-04 DIAGNOSIS — I10 Essential (primary) hypertension: Secondary | ICD-10-CM

## 2018-10-04 DIAGNOSIS — M1000 Idiopathic gout, unspecified site: Secondary | ICD-10-CM

## 2018-10-04 DIAGNOSIS — B372 Candidiasis of skin and nail: Secondary | ICD-10-CM

## 2018-10-04 DIAGNOSIS — M159 Polyosteoarthritis, unspecified: Secondary | ICD-10-CM

## 2018-10-04 DIAGNOSIS — E1122 Type 2 diabetes mellitus with diabetic chronic kidney disease: Secondary | ICD-10-CM | POA: Diagnosis not present

## 2018-10-04 DIAGNOSIS — M8949 Other hypertrophic osteoarthropathy, multiple sites: Secondary | ICD-10-CM

## 2018-10-04 MED ORDER — LOSARTAN POTASSIUM 50 MG PO TABS: 50.0000 mg | ORAL_TABLET | Freq: Every day | ORAL | 3 refills | Status: DC

## 2018-10-04 MED ORDER — NYSTATIN 100000 UNIT/GM EX POWD: Freq: Three times a day (TID) | CUTANEOUS | 0 refills | Status: DC

## 2018-10-04 MED ORDER — TRIAMCINOLONE ACETONIDE 0.1 % EX CREA: 1.0000 "application " | TOPICAL_CREAM | Freq: Two times a day (BID) | CUTANEOUS | 3 refills | Status: DC

## 2018-10-04 NOTE — Patient Instructions (Addendum)
STOP LISINOPRIL  START LOSARTAN 50 mg by mouth daily to replace  Follow up in 4 weeks for blood pressure check/follow up cough.   To not use triamcinolone cream to under breast and under abdominal fold. To clean area with soap and water and dry completely then apply powder

## 2018-10-04 NOTE — Progress Notes (Signed)
Careteam: Patient Care Team: Gildardo Cranker, DO as PCP - General (Internal Medicine) Nicholas Lose, MD as Consulting Physician (Hematology and Oncology) Specialists, [place] as Consulting Physician (Orthopedic Surgery)  Advanced Directive information Does Patient Have a Medical Advance Directive?: Yes, Type of Advance Directive: [place];Living will;Out of facility DNR (pink MOST or yellow form), Pre-existing out of facility DNR order (yellow form or pink MOST form): Yellow form placed in chart (order not valid for inpatient use);Pink MOST form placed in chart (order not valid for inpatient use)  Allergies  Allergen Reactions  . Actos [Pioglitazone] Swelling  . Hydrocodone Nausea Only and Other (See Comments)    "bloated"  . Codeine Rash  . Neurontin [Gabapentin] Palpitations and Other (See Comments)    dizziness  . Other Rash    Nuts--PECANS  . Peanut-Containing Drug Products Rash    Chief Complaint  Patient presents with  . Medical Management of Chronic Issues    Pt is being seen for a 4 month routine visit. Pt has been having pain on bottom of right foot that shoots up her leg.      HPI: Patient is a 83 y.o. female seen in the office today for routine follow up on chronic conditions. Former pt of Dr Eulas Post.  Reports dry cough for several months. Started after URI but has not gone away. Does not feel sick. Not productive.   DM - BS at home stable. She does not check daily.  No low BS reactions. She takes glipizide xl.. Occasional  numbness or tingling. A1c 6.1%. LDL 91; urine microalbumin/Cr ratio <0.2.  HTN - BP stable on amlodipine, lisinopril.   Thyroid - stable on levothyroxine. TSH 0.94; T4 free 1.1  Arthritis - stable on tramadol. Takes allopurinol for gout. No recent gout attacks. She is followed by [facility] and [facility]. Last left hip injection was in Mar 2017. Uric acid level 4.5  Reports her right foot hurt bad last  week and had to use her cane for a few days, now it is better. Tramadol helped relieve pain.   Hx right breast CA - followed by oncology Dr [name]. Last mammogram in March 2019. She takes tamoxifen and has occasional night sweats and hot flashes. No signs of recurrence   Weight loss - had weight loss 2 years ago but this has improved. Weight stable.   Review of Systems:  Review of Systems  Constitutional: Negative for chills, fever and weight loss.  HENT: Negative for tinnitus.   Respiratory: Negative for cough, sputum production and shortness of breath.   Cardiovascular: Negative for chest pain, palpitations and leg swelling.  Gastrointestinal: Negative for abdominal pain, constipation, diarrhea and heartburn.  Genitourinary: Negative for dysuria, frequency and urgency.  Musculoskeletal: Positive for joint pain (ankle pain that has improved now. ). Negative for back pain, falls and myalgias.  Skin: Negative.   Neurological: Negative for dizziness and headaches.  Psychiatric/Behavioral: Negative for depression and memory loss. The patient does not have insomnia.     Past Medical History:  Diagnosis Date  . Allergy   . Arthritis   . 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 Cancer ([place])   . Diabetes mellitus without complication ([place])   . GERD (gastroesophageal reflux disease)    occ  . Gout   . History of hiatal hernia   . HOH (hard of hearing)   . Hypertension   . Hypothyroidism   .  Pneumonia    hx  . Thyroid disease   . Wears glasses    Past Surgical History:  Procedure Laterality Date  . BREAST BIOPSY Right 10/26/2013  . BREAST LUMPECTOMY Right 12/16/2013  . BREAST LUMPECTOMY WITH NEEDLE LOCALIZATION Right 12/16/2013   Procedure: BREAST LUMPECTOMY WITH NEEDLE LOCALIZATION;  Surgeon: Edward Jolly, MD;  Location: [facility];  Service: General;  Laterality: Right;  . CATARACT EXTRACTION Left 12/30/2017  . CHOLECYSTECTOMY  1970  .  COLONOSCOPY    . FINGER SURGERY     right hand-  . gallbladder sugery    . RE-EXCISION OF BREAST LUMPECTOMY Right 01/30/2014   Procedure: RE-EXCISION OF BREAST LUMPECTOMY;  Surgeon: Edward Jolly, MD;  Location: [facility];  Service: General;  Laterality: Right;  . RE-EXCISION OF BREAST LUMPECTOMY Right 03/06/2014   Procedure: RE-EXCISION OF BREAST LUMPECTOMY;  Surgeon: Edward Jolly, MD;  Location: [facility];  Service: General;  Laterality: Right;  . SHOULDER ARTHROSCOPY  2011   left  . THYROID SURGERY  1970  . TOTAL SHOULDER ARTHROPLASTY Right 05/21/2016  . TOTAL SHOULDER ARTHROPLASTY Right 05/21/2016   Procedure: RIGHT TOTAL SHOULDER ARTHROPLASTY;  Surgeon: Ninetta Lights, MD;  Location: [facility];  Service: Orthopedics;  Laterality: Right;  . TUBAL LIGATION     Social History:   reports that she has never smoked. She has never used smokeless tobacco. She reports that she does not drink alcohol or use drugs.  Family History  Problem Relation Age of Onset  . Diabetes Mother   . Arthritis Father   . Seizures Sister   . Cancer Sister        breast  . Breast cancer Sister   . Multiple sclerosis Son        Deceased     Medications: Patient's Medications  New Prescriptions   No medications on file  Previous Medications   ALLOPURINOL (ZYLOPRIM) 100 MG TABLET    TAKE 1 TABLET BY MOUTH ONCE DAILY FOR GOUT   AMLODIPINE (NORVASC) 5 MG TABLET    TAKE 1 TABLET BY MOUTH EVERY DAY   FEEDING SUPPLEMENT, GLUCERNA SHAKE, (GLUCERNA SHAKE) LIQD    Take 237 mLs by mouth 2 (two) times daily between meals.   FEROSUL 325 (65 FE) MG TABLET    TAKE 1 TABLET BY MOUTH ONCE DAILY WITH BREAKFAST   GLIPIZIDE (GLUCOTROL XL) 5 MG 24 HR TABLET    TAKE 1 TABLET BY MOUTH ONCE DAILY WITH BREAKFAST   LEVOTHYROXINE (SYNTHROID, LEVOTHROID) 100 MCG TABLET    TAKE 1 TABLET BY MOUTH ONCE DAILY BEFORE BREAKFAST   LISINOPRIL (PRINIVIL,ZESTRIL) 20 MG TABLET    TAKE 1 TABLET BY  MOUTH ONCE DAILY   TAMOXIFEN (NOLVADEX) 20 MG TABLET    TAKE 1 TABLET BY MOUTH ONCE DAILY   TRAMADOL (ULTRAM) 50 MG TABLET    TAKE 1 TABLET BY MOUTH 3 TIMES DAILY AS NEEDED FOR PAIN   TRIAMCINOLONE CREAM (KENALOG) 0.1 %    Apply 1 application topically 2 (two) times daily.  Modified Medications   No medications on file  Discontinued Medications   LIDOCAINE (LIDODERM) 5 %    Place 1 patch onto the skin daily. Remove & Discard patch within 12 hours or as directed by MD   METHYLPREDNISOLONE (MEDROL DOSEPAK) 4 MG TBPK TABLET    Take as directed. START ON Wednesday June 09, 2018     Physical Exam:  Vitals:   10/04/18 1311  BP: 124/72  Pulse: 76  Temp: 98.6 F (37 C)  TempSrc: Oral  SpO2: 98%  Weight: 247 lb (112 kg)  Height: 5\' 2"  (1.575 m)   Body mass index is 45.18 kg/m.  Physical Exam  Constitutional: She is oriented to person, place, and time. She appears well-developed and well-nourished. No distress.  HENT:  Head: Normocephalic and atraumatic.  Right Ear: External ear normal.  Left Ear: External ear normal.  Mouth/Throat: Oropharynx is clear and moist. No oropharyngeal exudate.  MMM; no oral thrush  Eyes: Pupils are equal, round, and reactive to light. EOM are normal. No scleral icterus.  Neck: Normal range of motion. Neck supple. Carotid bruit is not present. No tracheal deviation present. No thyromegaly present.  Cardiovascular: Normal rate, regular rhythm and intact distal pulses. Exam reveals no gallop and no friction rub.  Murmur (1/6 SEM) heard. Pulmonary/Chest: Effort normal and breath sounds normal. No respiratory distress. She has no wheezes. She has no rales. Breasts are symmetrical.  Abdominal: Soft. Bowel sounds are normal. She exhibits no distension and no mass. There is no hepatosplenomegaly or hepatomegaly. There is no tenderness. There is no rebound and no guarding. No hernia.  Musculoskeletal: She exhibits no edema, tenderness or deformity.    Lymphadenopathy:    She has no cervical adenopathy.  Neurological: She is alert and oriented to person, place, and time. She has normal reflexes.  Skin: Skin is warm and dry. No rash noted.  Psychiatric: She has a normal mood and affect. Her behavior is normal. Judgment and thought content normal.    Labs reviewed: Basic Metabolic Panel: Recent Labs    01/18/18 1015 05/21/18 0821  NA 141 141  K 4.5 5.1  CL 109 109  CO2 27 26  GLUCOSE 106* 100*  BUN 17 12  CREATININE 0.89* 0.94*  CALCIUM 9.1 9.1  TSH 0.83 0.94   Liver Function Tests: Recent Labs    01/18/18 1015 05/21/18 0821  AST 14  --   ALT 9 10  BILITOT 0.3  --   PROT 6.2  --    No results for input(s): LIPASE, AMYLASE in the last 8760 hours. No results for input(s): AMMONIA in the last 8760 hours. CBC: Recent Labs    01/18/18 1015  WBC 4.6  NEUTROABS 2,208  HGB 11.5*  HCT 35.0  MCV 87.1  PLT 197   Lipid Panel: Recent Labs    01/18/18 1015 05/21/18 0821  CHOL 158 164  HDL 53 51  LDLCALC 86 91  TRIG 94 120  CHOLHDL 3.0 3.2   TSH: Recent Labs    01/18/18 1015 05/21/18 0821  TSH 0.83 0.94   A1C: Lab Results  Component Value Date   HGBA1C 6.1 (H) 05/21/2018     Assessment/Plan 1. Dermatitis - triamcinolone cream (KENALOG) 0.1 %; Apply 1 application topically 2 (two) times daily.  Dispense: 45 g; Refill: 3 To itchy skin, educated not to use for yeast under breast and to use nystatin powder at this time  2. Type 2 diabetes mellitus with stage 2 chronic kidney disease, without long-term current use of insulin (HCC) A1c at goal in July. Continues on glipizide XL 5 mg daily  - COMPLETE METABOLIC PANEL WITH GFR - Hemoglobin A1c  3. Hypothyroidism due to acquired atrophy of thyroid TSH at goal in July, continues on synthroid 100 mcg daily   4. Essential hypertension, benign -stable, cont current regimen.  - losartan (COZAAR) 50 MG tablet; Take 1 tablet (50 mg  total) by mouth daily.   Dispense: 90 tablet; Refill: 3 - COMPLETE METABOLIC PANEL WITH GFR  5. Primary osteoarthritis involving multiple joints Stable, recent increase in pain to right foot, improved now.  - COMPLETE METABOLIC PANEL WITH GFR  6. History of breast cancer Ongoing follow up with oncology, up to date with mammogram. Continues on tamoxifen  7. Idiopathic gout, unspecified chronicity, unspecified site -continues on allopurinol daily, feels like she may have had a flare, currently improved.  - Uric acid  8. Hyperlipidemia LDL goal <100 -diet modifications encouraged.  - COMPLETE METABOLIC PANEL WITH GFR  9. Yeast dermatitis - nystatin (MYCOSTATIN/NYSTOP) powder; Apply topically 3 (three) times daily.  Dispense: 15 g; Refill: 0  Next appt: 11/01/2018 Carlos American. [place], [facility] [phone]

## 2018-10-05 LAB — COMPLETE METABOLIC PANEL WITH GFR
AG Ratio: 1.3 (calc) (ref 1.0–2.5)
ALT: 9 U/L (ref 6–29)
AST: 15 U/L (ref 10–35)
Albumin: 3.5 g/dL — ABNORMAL LOW (ref 3.6–5.1)
Alkaline phosphatase (APISO): 60 U/L (ref 33–130)
BUN/Creatinine Ratio: 16 (calc) (ref 6–22)
BUN: 19 mg/dL (ref 7–25)
CO2: 29 mmol/L (ref 20–32)
Calcium: 9.4 mg/dL (ref 8.6–10.4)
Chloride: 109 mmol/L (ref 98–110)
Creat: 1.17 mg/dL — ABNORMAL HIGH (ref 0.60–0.88)
GFR, Est African American: 51 mL/min/{1.73_m2} — ABNORMAL LOW (ref >=60)
GFR, Est Non African American: 44 mL/min/{1.73_m2} — ABNORMAL LOW (ref >=60)
Globulin: 2.7 g/dL (calc) (ref 1.9–3.7)
Glucose, Bld: 118 mg/dL (ref 65–139)
Potassium: 4.6 mmol/L (ref 3.5–5.3)
Sodium: 141 mmol/L (ref 135–146)
Total Bilirubin: 0.3 mg/dL (ref 0.2–1.2)
Total Protein: 6.2 g/dL (ref 6.1–8.1)

## 2018-10-05 LAB — HEMOGLOBIN A1C
Hgb A1c MFr Bld: 6.3 % of total Hgb — ABNORMAL HIGH (ref <5.7)
Mean Plasma Glucose: 134 (calc)
eAG (mmol/L): 7.4 (calc)

## 2018-10-05 LAB — URIC ACID: Uric Acid, Serum: 4.8 mg/dL (ref 2.5–7.0)

## 2018-10-13 ENCOUNTER — Other Ambulatory Visit: Payer: Self-pay | Admitting: *Deleted

## 2018-10-13 MED ORDER — FERROUS SULFATE 325 (65 FE) MG PO TABS: ORAL_TABLET | ORAL | 1 refills | Status: DC

## 2018-10-13 NOTE — Telephone Encounter (Signed)
[facility]

## 2018-11-01 ENCOUNTER — Ambulatory Visit: Payer: Medicare Other | Admitting: Nurse Practitioner

## 2018-11-04 ENCOUNTER — Other Ambulatory Visit: Payer: Self-pay | Admitting: *Deleted

## 2018-11-04 MED ORDER — TRAMADOL HCL 50 MG PO TABS: 50.0000 mg | ORAL_TABLET | Freq: Three times a day (TID) | ORAL | 0 refills | Status: DC | PRN

## 2018-11-04 NOTE — Telephone Encounter (Signed)
rx called into pharmacy Database checked and verified  

## 2018-11-16 ENCOUNTER — Other Ambulatory Visit: Payer: Self-pay | Admitting: *Deleted

## 2018-11-16 DIAGNOSIS — E119 Type 2 diabetes mellitus without complications: Secondary | ICD-10-CM

## 2018-11-16 MED ORDER — GLIPIZIDE ER 5 MG PO TB24: ORAL_TABLET | ORAL | 0 refills | Status: DC

## 2018-11-16 NOTE — Telephone Encounter (Signed)
[facility]

## 2018-11-26 ENCOUNTER — Encounter: Payer: Self-pay | Admitting: Adult Health

## 2018-11-26 ENCOUNTER — Ambulatory Visit (INDEPENDENT_AMBULATORY_CARE_PROVIDER_SITE_OTHER): Payer: Medicare Other | Admitting: Adult Health

## 2018-11-26 VITALS — BP 138/62 | HR 77 | Temp 97.9°F | Ht 62.0 in | Wt 248.8 lb

## 2018-11-26 DIAGNOSIS — M19072 Primary osteoarthritis, left ankle and foot: Secondary | ICD-10-CM

## 2018-11-26 DIAGNOSIS — D508 Other iron deficiency anemias: Secondary | ICD-10-CM

## 2018-11-26 DIAGNOSIS — M1000 Idiopathic gout, unspecified site: Secondary | ICD-10-CM | POA: Diagnosis not present

## 2018-11-26 DIAGNOSIS — E1122 Type 2 diabetes mellitus with diabetic chronic kidney disease: Secondary | ICD-10-CM | POA: Diagnosis not present

## 2018-11-26 DIAGNOSIS — N182 Chronic kidney disease, stage 2 (mild): Secondary | ICD-10-CM

## 2018-11-26 DIAGNOSIS — B372 Candidiasis of skin and nail: Secondary | ICD-10-CM

## 2018-11-26 DIAGNOSIS — K5901 Slow transit constipation: Secondary | ICD-10-CM

## 2018-11-26 DIAGNOSIS — E034 Atrophy of thyroid (acquired): Secondary | ICD-10-CM

## 2018-11-26 MED ORDER — DICLOFENAC SODIUM 1 % TD GEL: 4.0000 g | Freq: Four times a day (QID) | TRANSDERMAL | 0 refills | Status: AC | PRN

## 2018-11-26 MED ORDER — NYSTATIN 100000 UNIT/GM EX POWD: Freq: Three times a day (TID) | CUTANEOUS | 0 refills | Status: DC

## 2018-11-26 NOTE — Progress Notes (Addendum)
[facility] clinic  Provider:   Amanii Snethen C. Medina-Vargas - FNP   Goals of Care:  Advanced Directives 11/26/2018  Does Patient Have a Medical Advance Directive? Yes  Type of Paramedic of [place];Living will  Does patient want to make changes to medical advance directive? No - Patient declined  Copy of [place] in Chart? Yes - validated most recent copy scanned in chart (See row information)  Pre-existing out of facility DNR order (yellow form or pink MOST form) -     Chief Complaint  Patient presents with  . Acute Visit    Left ankle and foot pain    HPI: Patient is a 84 y.o. female seen today for an acute visit for left foot pain X 4 days. She does not recall having had trauma to it. She has history of gout and takes Allopurinol daily. Noted that she has bilateral ankle edema, Left 2+ and Right 1+. Left foot has no bruising, no change in color and pedal pulse. She takes Tramadol PRN for pain. She, also, complains of constipation, no BM X 4 days. She said that she has not taken Miralax regularly but has some at home.  On 05/21/18 tsh was 0.94. She currently takes Levothyroxine daily. She currently takes Iron daily with last hgb 11.5 (01/18/18    Past Medical History:  Diagnosis Date  . Allergy   . Arthritis   . 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 Cancer ([place])   . Diabetes mellitus without complication ([place])   . GERD (gastroesophageal reflux disease)    occ  . Gout   . History of hiatal hernia   . HOH (hard of hearing)   . Hypertension   . Hypothyroidism   . Pneumonia    hx  . Thyroid disease   . Wears glasses     Past Surgical History:  Procedure Laterality Date  . BREAST BIOPSY Right 10/26/2013  . BREAST LUMPECTOMY Right 12/16/2013  . BREAST LUMPECTOMY WITH NEEDLE LOCALIZATION Right 12/16/2013   Procedure: BREAST LUMPECTOMY WITH NEEDLE LOCALIZATION;  Surgeon: Edward Jolly, MD;  Location: [facility];  Service: General;  Laterality: Right;  . CATARACT EXTRACTION Left 12/30/2017  . CHOLECYSTECTOMY  1970  . COLONOSCOPY    . FINGER SURGERY     right hand-  . gallbladder sugery    . RE-EXCISION OF BREAST LUMPECTOMY Right 01/30/2014   Procedure: RE-EXCISION OF BREAST LUMPECTOMY;  Surgeon: Edward Jolly, MD;  Location: [facility];  Service: General;  Laterality: Right;  . RE-EXCISION OF BREAST LUMPECTOMY Right 03/06/2014   Procedure: RE-EXCISION OF BREAST LUMPECTOMY;  Surgeon: Edward Jolly, MD;  Location: [facility];  Service: General;  Laterality: Right;  . SHOULDER ARTHROSCOPY  2011   left  . THYROID SURGERY  1970  . TOTAL SHOULDER ARTHROPLASTY Right 05/21/2016  . TOTAL SHOULDER ARTHROPLASTY Right 05/21/2016   Procedure: RIGHT TOTAL SHOULDER ARTHROPLASTY;  Surgeon: Ninetta Lights, MD;  Location: [facility];  Service: Orthopedics;  Laterality: Right;  . TUBAL LIGATION      Allergies  Allergen Reactions  . Actos [Pioglitazone] Swelling  . Hydrocodone Nausea Only and Other (See Comments)    "bloated"  . Codeine Rash  . Neurontin [Gabapentin] Palpitations and Other (See Comments)    dizziness  . Other Rash    Nuts--PECANS  . Peanut-Containing Drug Products Rash    Outpatient Encounter Medications as  of 11/26/2018  Medication Sig  . allopurinol (ZYLOPRIM) 100 MG tablet TAKE 1 TABLET BY MOUTH ONCE DAILY FOR GOUT  . amLODipine (NORVASC) 5 MG tablet TAKE 1 TABLET BY MOUTH EVERY DAY  . diclofenac sodium (VOLTAREN) 1 % GEL Apply 4 g topically 4 (four) times daily as needed.  . feeding supplement, GLUCERNA SHAKE, (GLUCERNA SHAKE) LIQD Take 237 mLs by mouth 2 (two) times daily between meals.  . ferrous sulfate (FEROSUL) 325 (65 FE) MG tablet TAKE 1 TABLET BY MOUTH ONCE DAILY WITH BREAKFAST  . glipiZIDE (GLUCOTROL XL) 5 MG 24 hr tablet Take one tablet by mouth once daily with breakfast  . levothyroxine (SYNTHROID, LEVOTHROID) 100 MCG  tablet TAKE 1 TABLET BY MOUTH ONCE DAILY BEFORE BREAKFAST  . losartan (COZAAR) 50 MG tablet Take 1 tablet (50 mg total) by mouth daily.  Marland Kitchen nystatin (MYCOSTATIN/NYSTOP) powder Apply topically 3 (three) times daily.  . tamoxifen (NOLVADEX) 20 MG tablet TAKE 1 TABLET BY MOUTH ONCE DAILY  . traMADol (ULTRAM) 50 MG tablet Take 1 tablet (50 mg total) by mouth 3 (three) times daily as needed for moderate pain.  Marland Kitchen triamcinolone cream (KENALOG) 0.1 % Apply 1 application topically 2 (two) times daily.  . [DISCONTINUED] diclofenac sodium (VOLTAREN) 1 % GEL Apply 4 g topically 4 (four) times daily.  . [DISCONTINUED] nystatin (MYCOSTATIN/NYSTOP) powder Apply topically 3 (three) times daily.   No facility-administered encounter medications on file as of 11/26/2018.     Review of Systems:  Review of Systems  Constitutional: Negative for activity change, appetite change, chills and fever.  HENT: Negative for congestion, dental problem, mouth sores and nosebleeds.   Eyes: Negative for pain, discharge and visual disturbance.  Respiratory: Negative for cough, chest tightness, shortness of breath and wheezing.   Cardiovascular: Positive for leg swelling. Negative for chest pain and palpitations.  Gastrointestinal: Positive for constipation. Negative for abdominal distention, abdominal pain, blood in stool, nausea and vomiting.  Endocrine: Negative.  Negative for cold intolerance.  Genitourinary: Negative for difficulty urinating, hematuria and pelvic pain.  Musculoskeletal: Positive for joint swelling. Negative for back pain and neck stiffness.  Skin: Positive for rash. Negative for wound.  Neurological: Negative for dizziness, speech difficulty, weakness and headaches.  Hematological: Negative for adenopathy. Does not bruise/bleed easily.  Psychiatric/Behavioral: Negative.  Negative for sleep disturbance.    Health Maintenance  Topic Date Due  . INFLUENZA VACCINE  06/10/2018  . HEMOGLOBIN A1C  04/04/2019   . FOOT EXAM  05/26/2019  . OPHTHALMOLOGY EXAM  08/11/2019  . TETANUS/TDAP  04/18/2026  . DEXA SCAN  Completed  . PNA vac Low Risk Adult  Completed    Physical Exam: Vitals:   11/26/18 0944  BP: 138/62  Pulse: 77  Temp: 97.9 F (36.6 C)  TempSrc: Oral  SpO2: 95%  Weight: 248 lb 12.8 oz (112.9 kg)  Height: '5\' 2"'  (1.575 m)   Body mass index is 45.51 kg/m. Physical Exam Constitutional:      General: She is not in acute distress.    Comments: Morbidly obese  HENT:     Head: Normocephalic.     Nose: Nose normal. No congestion.     Mouth/Throat:     Mouth: Mucous membranes are moist.     Pharynx: Oropharynx is clear.  Neck:     Musculoskeletal: Normal range of motion.  Cardiovascular:     Rate and Rhythm: Normal rate and regular rhythm.     Pulses: Normal pulses.  Heart sounds: Normal heart sounds.  Pulmonary:     Effort: Pulmonary effort is normal.     Breath sounds: Normal breath sounds.  Abdominal:     General: Bowel sounds are normal.     Palpations: Abdomen is soft.  Musculoskeletal: Normal range of motion.        General: Swelling and tenderness present.     Right lower leg: Edema present.     Left lower leg: Edema present.     Comments: Left ankle edema 2+, right foot edema 1+  Skin:    General: Skin is warm and dry.     Findings: Rash present.     Comments: Slight erythematous rashes under bilateral breasts  Neurological:     General: No focal deficit present.     Mental Status: She is alert and oriented to person, place, and time.  Psychiatric:        Mood and Affect: Mood normal.        Behavior: Behavior normal.        Thought Content: Thought content normal.        Judgment: Judgment normal.     Labs reviewed: Basic Metabolic Panel: Recent Labs    01/18/18 1015 05/21/18 0821 10/04/18 1349  NA 141 141 141  K 4.5 5.1 4.6  CL 109 109 109  CO2 '27 26 29  ' GLUCOSE 106* 100* 118  BUN '17 12 19  ' CREATININE 0.89* 0.94* 1.17*  CALCIUM 9.1 9.1  9.4  TSH 0.83 0.94  --    Liver Function Tests: Recent Labs    01/18/18 1015 05/21/18 0821 10/04/18 1349  AST 14  --  15  ALT '9 10 9  ' BILITOT 0.3  --  0.3  PROT 6.2  --  6.2   CBC: Recent Labs    01/18/18 1015 11/26/18 1037  WBC 4.6 5.2  NEUTROABS 2,208  --   HGB 11.5* 12.3  HCT 35.0 37.0  MCV 87.1 88.5  PLT 197 200   Lipid Panel: Recent Labs    01/18/18 1015 05/21/18 0821  CHOL 158 164  HDL 53 51  LDLCALC 86 91  TRIG 94 120  CHOLHDL 3.0 3.2   Lab Results  Component Value Date   HGBA1C 6.3 (H) 10/04/2018     Assessment/Plan 1. Primary osteoarthritis of left ankle - diclofenac sodium (VOLTAREN) 1 % GEL; Apply 4 g topically 4 (four) times daily as needed.  Dispense: 100 g; Refill: 0  2. Hypothyroidism due to acquired atrophy of thyroid - continue taking Levothyroxine - check TSH  3. Other iron deficiency anemia - CBC result today showed hgb 12.3, will need to discontinue Iron  4. Idiopathic gout, unspecified chronicity, unspecified site - continue taking Allopurinol - check Uric Acid  5. Type 2 diabetes mellitus with stage 2 chronic kidney disease, without long-term current use of insulin (HCC) - continue taking Glucotrol - BMP with eGFR([facility]) - Hemoglobin A1c  6. Slow transit constipation - continue Miralax 17 gm PRN  7. Yeast dermatitis - continue nystatin (MYCOSTATIN/NYSTOP) powder; Apply topically 3 (three) times daily.  Dispense: 15 g; Refill: 0 .   Labs/tests ordered:  Uric acid, tsh, CBC, hgbA1c  Next appt:  01/10/2019   Prince Couey C. [facility] [phone]

## 2018-11-26 NOTE — Patient Instructions (Addendum)
Apply Diclofenac gel 1% to left ankle 4X/day as needed for osteoarthritic pain.  Continue Mycostatin powder to skin under bilateral breast 3X/day. Keep area clean and dry.  Continue taking Miralax 17 gm twice a day as needed.

## 2018-11-27 LAB — BASIC METABOLIC PANEL WITH GFR
BUN/Creatinine Ratio: 15 (calc) (ref 6–22)
BUN: 14 mg/dL (ref 7–25)
CO2: 28 mmol/L (ref 20–32)
Calcium: 9.5 mg/dL (ref 8.6–10.4)
Chloride: 105 mmol/L (ref 98–110)
Creat: 0.95 mg/dL — ABNORMAL HIGH (ref 0.60–0.88)
GFR, Est African American: 65 mL/min/{1.73_m2} (ref >=60)
GFR, Est Non African American: 56 mL/min/{1.73_m2} — ABNORMAL LOW (ref >=60)
Glucose, Bld: 91 mg/dL (ref 65–99)
Potassium: 4.5 mmol/L (ref 3.5–5.3)
Sodium: 140 mmol/L (ref 135–146)

## 2018-11-27 LAB — TSH: TSH: 1.54 mIU/L (ref 0.40–4.50)

## 2018-11-27 LAB — CBC
HCT: 37 % (ref 35.0–45.0)
Hemoglobin: 12.3 g/dL (ref 11.7–15.5)
MCH: 29.4 pg (ref 27.0–33.0)
MCHC: 33.2 g/dL (ref 32.0–36.0)
MCV: 88.5 fL (ref 80.0–100.0)
MPV: 11.5 fL (ref 7.5–12.5)
Platelets: 200 10*3/uL (ref 140–400)
RBC: 4.18 10*6/uL (ref 3.80–5.10)
RDW: 13.2 % (ref 11.0–15.0)
WBC: 5.2 10*3/uL (ref 3.8–10.8)

## 2018-11-27 LAB — HEMOGLOBIN A1C
Hgb A1c MFr Bld: 6.2 % of total Hgb — ABNORMAL HIGH (ref <5.7)
Mean Plasma Glucose: 131 (calc)
eAG (mmol/L): 7.3 (calc)

## 2018-11-27 LAB — URIC ACID: Uric Acid, Serum: 4.8 mg/dL (ref 2.5–7.0)

## 2018-11-30 ENCOUNTER — Telehealth: Payer: Self-pay | Admitting: *Deleted

## 2018-11-30 NOTE — Telephone Encounter (Signed)
Patient is calling requesting lab work results that were done last week 11/26/2018. Please Advise.

## 2018-12-01 ENCOUNTER — Encounter: Payer: Self-pay | Admitting: Adult Health

## 2018-12-01 NOTE — Telephone Encounter (Signed)
Patient notified that we are waiting for a response on her labwork. She was fine.

## 2018-12-01 NOTE — Telephone Encounter (Signed)
Called patient, no answer, and no voicemail. Reason for call- give patient a status update, that we are awaiting response from provider

## 2018-12-01 NOTE — Telephone Encounter (Signed)
In the interim, can we call or talk to [facility] and get her plan for the patient's labs so the patient is not waiting longer?  We can review how to do the note this evening at the provider meeting.

## 2018-12-01 NOTE — Telephone Encounter (Signed)
I talked to the patient and told her to stop taking FeSO4 (Iron). She said that she is still having foot pain and was wondering if it is due to her being flatfooted. She said that she will call [facility] at [facility].

## 2018-12-01 NOTE — Telephone Encounter (Signed)
[place] please advise on lab results by responding under documentation in this telephone encounter or by responded on the actual result note (standard of work). We have to provide patient with a status update on her request for results.  Thanks  S.Chrae Demisha Nokes/CMA, Clinical Team Lead

## 2018-12-01 NOTE — Telephone Encounter (Signed)
Her hgb has improved. It is now 12.3, previously it was 11.5 (01/18/18). She can now discontinue her FeSO4 (Iron) medication since hgb is now normal. Her platelet is normal at 220, wbc is 5.2, normal. I tried to call the patient twice but she did not answer.   Message copied and pasted from a staff message   I called patient and discussed results, patient verbalized understanding of results. Medication list updated to reflect med change

## 2018-12-14 ENCOUNTER — Ambulatory Visit: Payer: Medicare Other | Admitting: Family

## 2018-12-14 ENCOUNTER — Other Ambulatory Visit: Payer: Self-pay | Admitting: *Deleted

## 2018-12-14 MED ORDER — ALLOPURINOL 100 MG PO TABS: ORAL_TABLET | ORAL | 0 refills | Status: DC

## 2018-12-14 NOTE — Telephone Encounter (Signed)
[facility]

## 2018-12-15 ENCOUNTER — Ambulatory Visit (INDEPENDENT_AMBULATORY_CARE_PROVIDER_SITE_OTHER): Payer: Medicare Other | Admitting: Family

## 2018-12-15 ENCOUNTER — Encounter: Payer: Self-pay | Admitting: Family

## 2018-12-15 ENCOUNTER — Ambulatory Visit
Admission: RE | Admit: 2018-12-15 | Discharge: 2018-12-15 | Disposition: A | Discharge status: Home / Self Care | Payer: Medicare Other | Source: Ambulatory Visit | Attending: Family | Admitting: Family

## 2018-12-15 VITALS — BP 120/84 | HR 100 | Temp 99.1°F | Ht 62.0 in | Wt 249.2 lb

## 2018-12-15 DIAGNOSIS — M25561 Pain in right knee: Secondary | ICD-10-CM | POA: Diagnosis not present

## 2018-12-15 DIAGNOSIS — M25461 Effusion, right knee: Secondary | ICD-10-CM

## 2018-12-15 DIAGNOSIS — R509 Fever, unspecified: Secondary | ICD-10-CM | POA: Diagnosis not present

## 2018-12-15 DIAGNOSIS — M79661 Pain in right lower leg: Secondary | ICD-10-CM

## 2018-12-15 DIAGNOSIS — M7989 Other specified soft tissue disorders: Secondary | ICD-10-CM | POA: Diagnosis not present

## 2018-12-15 NOTE — Progress Notes (Signed)
Provider: Yer Olivencia FNP-C  Lauree Chandler, NP  Patient Care Team: Lauree Chandler, NP as PCP - General (Geriatric Medicine) Nicholas Lose, MD as Consulting Physician (Hematology and Oncology) [facility], [place] as Consulting Physician (Orthopedic Surgery)  Extended Emergency Contact Information Primary Emergency Contact: [name] Address: [address]          [place], [place] [address] [place] Phone: [phone] Mobile Phone: [phone] Relation: Daughter Secondary Emergency Contact: [name], [place] [address] [name] of [place] Phone: [phone] Relation: Daughter  Goals of care: Advanced Directive information Advanced Directives 12/15/2018  Does Patient Have a Medical Advance Directive? Yes  Type of Advance Directive Out of facility DNR (pink MOST or yellow form);Living will  Does patient want to make changes to medical advance directive? No - Patient declined  Copy of [place] in Chart? Yes - validated most recent copy scanned in chart (See row information)  Pre-existing out of facility DNR order (yellow form or pink MOST form) -     Chief Complaint  Patient presents with  . Acute Visit    Right knee pain duration 3 - 4 days, patient c/o of pain in the leg also, Currently taking tramadol and voltaren gel. States this is not helping with pain.     HPI:  Pt is a 84 y.o. female seen today for an acute visit for evaluation of right knee pain for the past 4 days.Also complains of right leg pain and swelling.she states has taken tramadol and used Voltaren gel without any relief.Pain improves with lying down but worst with walking and bending leg. She denies any injuries to knee/leg or fall.Pain affecting her activities of daily living.she denies any fever,chills or redness of knee.Her Temp 99.1 during visit.    Past Medical History:  Diagnosis Date  . Allergy   .  Arthritis   . Breast cancer ([facility]) 10/24/13   right upper inner- DCIS  . Cancer ([facility])   . Diabetes mellitus without complication ([facility])   . GERD (gastroesophageal reflux disease)    occ  . Gout   . History of hiatal hernia   . HOH (hard of hearing)   . Hypertension   . Hypothyroidism   . Pneumonia    hx  . Thyroid disease   . Wears glasses    Past Surgical History:  Procedure Laterality Date  . BREAST BIOPSY Right 10/26/2013  . BREAST LUMPECTOMY Right 12/16/2013  . BREAST LUMPECTOMY WITH NEEDLE LOCALIZATION Right 12/16/2013   Procedure: BREAST LUMPECTOMY WITH NEEDLE LOCALIZATION;  Surgeon: Edward Jolly, MD;  Location: [facility];  Service: General;  Laterality: Right;  . CATARACT EXTRACTION Left 12/30/2017  . CHOLECYSTECTOMY  1970  . COLONOSCOPY    . FINGER SURGERY     right hand-  . gallbladder sugery    . RE-EXCISION OF BREAST LUMPECTOMY Right 01/30/2014   Procedure: RE-EXCISION OF BREAST LUMPECTOMY;  Surgeon: Edward Jolly, MD;  Location: [facility];  Service: General;  Laterality: Right;  . RE-EXCISION OF BREAST LUMPECTOMY Right 03/06/2014   Procedure: RE-EXCISION OF BREAST LUMPECTOMY;  Surgeon: Edward Jolly, MD;  Location: [facility];  Service: General;  Laterality: Right;  . SHOULDER ARTHROSCOPY  2011   left  . THYROID SURGERY  1970  . TOTAL SHOULDER ARTHROPLASTY Right 05/21/2016  . TOTAL SHOULDER ARTHROPLASTY Right 05/21/2016   Procedure:  RIGHT TOTAL SHOULDER ARTHROPLASTY;  Surgeon: Ninetta Lights, MD;  Location: [facility];  Service: Orthopedics;  Laterality: Right;  . TUBAL LIGATION      Allergies  Allergen Reactions  . Actos [Pioglitazone] Swelling  . Hydrocodone Nausea Only and Other (See Comments)    "bloated"  . Codeine Rash  . Neurontin [Gabapentin] Palpitations and Other (See Comments)    dizziness  . Other Rash    Nuts--PECANS  . Peanut-Containing Drug Products Rash    Outpatient Encounter  Medications as of 12/15/2018  Medication Sig  . allopurinol (ZYLOPRIM) 100 MG tablet Take one tablet by mouth once daily for gout  . amLODipine (NORVASC) 5 MG tablet TAKE 1 TABLET BY MOUTH EVERY DAY  . diclofenac sodium (VOLTAREN) 1 % GEL Apply 4 g topically 4 (four) times daily as needed.  . feeding supplement, GLUCERNA SHAKE, (GLUCERNA SHAKE) LIQD Take 237 mLs by mouth 2 (two) times daily between meals. (Patient taking differently: Take 237 mLs by mouth as needed. Patient states she can not afford to take all the time)  . glipiZIDE (GLUCOTROL XL) 5 MG 24 hr tablet Take one tablet by mouth once daily with breakfast  . levothyroxine (SYNTHROID, LEVOTHROID) 100 MCG tablet TAKE 1 TABLET BY MOUTH ONCE DAILY BEFORE BREAKFAST  . losartan (COZAAR) 50 MG tablet Take 1 tablet (50 mg total) by mouth daily.  Marland Kitchen nystatin (MYCOSTATIN/NYSTOP) powder Apply topically 3 (three) times daily.  . tamoxifen (NOLVADEX) 20 MG tablet TAKE 1 TABLET BY MOUTH ONCE DAILY  . traMADol (ULTRAM) 50 MG tablet Take 1 tablet (50 mg total) by mouth 3 (three) times daily as needed for moderate pain.  Marland Kitchen triamcinolone cream (KENALOG) 0.1 % Apply 1 application topically 2 (two) times daily.   No facility-administered encounter medications on file as of 12/15/2018.     Review of Systems  Constitutional: Negative for chills and unexpected weight change.       Temp 99.1   HENT: Negative for congestion, rhinorrhea, sinus pressure, sinus pain, sneezing and sore throat.   Eyes: Negative for discharge, redness and itching.  Respiratory: Negative for cough, chest tightness, shortness of breath and wheezing.   Cardiovascular: Positive for leg swelling. Negative for chest pain and palpitations.  Gastrointestinal: Negative for abdominal distention, abdominal pain, constipation, diarrhea, nausea and vomiting.  Genitourinary: Negative for dysuria, flank pain, frequency and urgency.  Musculoskeletal: Positive for gait problem.       Right  knee/leg  pain   Skin: Negative for color change, pallor, rash and wound.  Neurological: Negative for dizziness, weakness, light-headedness, numbness and headaches.    Immunization History  Administered Date(s) Administered  . Influenza,inj,Quad PF,6+ Mos 09/07/2015, 07/25/2016, 09/22/2017  . Influenza-Unspecified 08/10/2014  . Pneumococcal Conjugate-13 01/14/2017  . Pneumococcal-Unspecified 11/10/2013  . Tdap 04/18/2016  . Zoster 04/18/2012   Pertinent  Health Maintenance Due  Topic Date Due  . INFLUENZA VACCINE  06/10/2018  . FOOT EXAM  05/26/2019  . HEMOGLOBIN A1C  05/27/2019  . OPHTHALMOLOGY EXAM  08/11/2019  . DEXA SCAN  Completed  . PNA vac Low Risk Adult  Completed   Fall Risk  12/15/2018 10/04/2018 04/22/2018 01/22/2018 01/07/2018  Falls in the past year? 0 0 No No No  Number falls in past yr: 0 - - - -  Injury with Fall? 0 - - - -    Vitals:   12/15/18 1453  BP: 120/84  Pulse: 100  Temp: 99.1 F (37.3 C)  TempSrc: Oral  SpO2: 96%  Weight: 249 lb 3.2 oz (113 kg)  Height: 5\' 2"  (1.575 m)   Body mass index is 45.58 kg/m. Physical Exam Constitutional:      General: She is not in acute distress.    Appearance: She is obese.  HENT:     Head: Normocephalic.     Mouth/Throat:     Mouth: Mucous membranes are moist.     Pharynx: Oropharynx is clear. No oropharyngeal exudate or posterior oropharyngeal erythema.  Eyes:     General: No scleral icterus.       Right eye: No discharge.        Left eye: No discharge.     Pupils: Pupils are equal, round, and reactive to light.  Neck:     Musculoskeletal: Normal range of motion. No neck rigidity or muscular tenderness.  Cardiovascular:     Rate and Rhythm: Normal rate and regular rhythm.     Pulses: Normal pulses.     Heart sounds: Murmur present. No friction rub. No gallop.   Pulmonary:     Effort: Pulmonary effort is normal. No respiratory distress.     Breath sounds: Normal breath sounds. No wheezing, rhonchi or  rales.  Chest:     Chest wall: No tenderness.  Abdominal:     General: There is no distension.     Palpations: Abdomen is soft. There is no mass.     Tenderness: There is no abdominal tenderness. There is no right CVA tenderness, left CVA tenderness, guarding or rebound.  Musculoskeletal:     Right knee: She exhibits decreased range of motion, swelling and effusion. She exhibits no ecchymosis, no erythema, normal alignment and normal patellar mobility. Tenderness found.     Left lower leg: No edema.     Comments: Right leg 1+ edema with calf tenderness to palpation   Lymphadenopathy:     Cervical: No cervical adenopathy.  Skin:    General: Skin is warm and dry.     Coloration: Skin is not pale.     Findings: No erythema or rash.  Neurological:     Mental Status: She is alert and oriented to person, place, and time.     Cranial Nerves: No cranial nerve deficit.     Motor: No weakness.     Coordination: Coordination normal.     Gait: Gait abnormal.  Psychiatric:        Mood and Affect: Mood normal.        Behavior: Behavior normal.        Thought Content: Thought content normal.        Judgment: Judgment normal.     Labs reviewed: Recent Labs    05/21/18 0821 10/04/18 1349 11/26/18 1037  NA 141 141 140  K 5.1 4.6 4.5  CL 109 109 105  CO2 26 29 28   GLUCOSE 100* 118 91  BUN 12 19 14   CREATININE 0.94* 1.17* 0.95*  CALCIUM 9.1 9.4 9.5   Recent Labs    01/18/18 1015 05/21/18 0821 10/04/18 1349  AST 14  --  15  ALT 9 10 9   BILITOT 0.3  --  0.3  PROT 6.2  --  6.2   Recent Labs    01/18/18 1015 11/26/18 1037  WBC 4.6 5.2  NEUTROABS 2,208  --   HGB 11.5* 12.3  HCT 35.0 37.0  MCV 87.1 88.5  PLT 197 200   Lab Results  Component Value Date   TSH 1.54 11/26/2018   Lab Results  Component Value Date   HGBA1C 6.2 (H) 11/26/2018   Lab Results  Component Value Date   CHOL 164 05/21/2018   HDL 51 05/21/2018   LDLCALC 91 05/21/2018   TRIG 120 05/21/2018    CHOLHDL 3.2 05/21/2018    Significant Diagnostic Results in last 30 days:  No results found.  Assessment/Plan   Pain and swelling of knee, right Temp 99.1 right knee swollen and tender to palpation.? Effusion.no erythema noted.Also possible baker's cyst though difficult to discern due to patient's obesity.Limted ROM and pain with flexion and extension. - apply ice bag twice daily 10 minute.  - Take Tylenol 500 mg tablet one by mouth every 6 hour as needed for pain.continue on Tramadol and voltaren gel as directed.  - DG Knee Complete 4 Views Right; Future - Ambulatory referral to Orthopedic Surgery ASAP  - CBC with Differential/Platelet  2. Low grade fever Check Temp and notify provider Temp > 100.5  Take Tylenol as above  - CBC with Differential/Platelet  3. Pain and swelling of right lower leg Right leg 1+ edema with calf muscle tenderness to palpation.  - US Venous Img Lower Unilateral Right; Future  Family/ staff Communication: Reviewed plan of care with patient   Labs/tests ordered:  - CBC with Differential/Platelet - DG Knee Complete 4 Views Right; Future  Sandrea Hughs, NP

## 2018-12-15 NOTE — Patient Instructions (Addendum)
1. Take extra strength Tylenol 500 mg tablet one by mouth every 6 hours as needed for pain  2. Right Knee X-ray ordered today will call you with results 3. Orthopedic referral will call with appointment  4. Notify provider if running a fever > 100.5 or symptoms worsen  Acute Knee Pain, Adult Many things can cause knee pain. Sometimes, knee pain is sudden (acute) and may be caused by damage, swelling, or irritation of the muscles and tissues that support your knee. The pain often goes away on its own with time and rest. If the pain does not go away, tests may be done to find out what is causing the pain. Follow these instructions at home: Pay attention to any changes in your symptoms. Take these actions to relieve your pain. If you have a knee sleeve or brace:   Wear the sleeve or brace as told by your doctor. Remove it only as told by your doctor.  Loosen the sleeve or brace if your toes: ? Tingle. ? Become numb. ? Turn cold and blue.  Keep the sleeve or brace clean.  If the sleeve or brace is not waterproof: ? Do not let it get wet. ? Cover it with a watertight covering when you take a bath or shower. Activity  Rest your knee.  Do not do things that cause pain.  Avoid activities where both feet leave the ground at the same time (high-impact activities). Examples are running, jumping rope, and doing jumping jacks.  Work with a physical therapist to make a safe exercise program, as told by your doctor. Managing pain, stiffness, and swelling   If told, put ice on the knee: ? Put ice in a plastic bag. ? Place a towel between your skin and the bag. ? Leave the ice on for 20 minutes, 2-3 times a day.  If told, put pressure (compression) on your injured knee to control swelling, give support, and help with discomfort. Compression may be done with an elastic bandage. General instructions  Take all medicines only as told by your doctor.  Raise (elevate) your knee while you are  sitting or lying down. Make sure your knee is higher than your heart.  Sleep with a pillow under your knee.  Do not use any products that contain nicotine or tobacco. These include cigarettes, e-cigarettes, and chewing tobacco. These products may slow down healing. If you need help quitting, ask your doctor.  If you are overweight, work with your doctor and a food expert (dietitian) to set goals to lose weight. Being overweight can make your knee hurt more.  Keep all follow-up visits as told by your doctor. This is important. Contact a doctor if:  The knee pain does not stop.  The knee pain changes or gets worse.  You have a fever along with knee pain.  Your knee feels warm when you touch it.  Your knee gives out or locks up. Get help right away if:  Your knee swells, and the swelling gets worse.  You cannot move your knee.  You have very bad knee pain. Summary  Many things can cause knee pain. The pain often goes away on its own with time and rest.  Your doctor may do tests to find out the cause of the pain.  Pay attention to any changes in your symptoms. Relieve your pain with rest, medicines, light activity, and use of ice.  Get help right away if you cannot move your knee or your knee  pain is very bad. This information is not intended to replace advice given to you by your health care provider. Make sure you discuss any questions you have with your health care provider. Document Released: 01/23/2009 Document Revised: 04/08/2018 Document Reviewed: 04/08/2018 Elsevier Interactive Patient Education  2019 [employer].

## 2018-12-16 LAB — CBC WITH DIFFERENTIAL/PLATELET
Absolute Monocytes: 989 cells/uL — ABNORMAL HIGH (ref 200–950)
Basophils Absolute: 19 cells/uL (ref 0–200)
Basophils Relative: 0.2 %
Eosinophils Absolute: 48 cells/uL (ref 15–500)
Eosinophils Relative: 0.5 %
HCT: 36.6 % (ref 35.0–45.0)
Hemoglobin: 12 g/dL (ref 11.7–15.5)
Lymphs Abs: 1152 cells/uL (ref 850–3900)
MCH: 29.1 pg (ref 27.0–33.0)
MCHC: 32.8 g/dL (ref 32.0–36.0)
MCV: 88.8 fL (ref 80.0–100.0)
MPV: 12.2 fL (ref 7.5–12.5)
Monocytes Relative: 10.3 %
Neutro Abs: 7392 cells/uL (ref 1500–7800)
Neutrophils Relative %: 77 %
Platelets: 198 10*3/uL (ref 140–400)
RBC: 4.12 10*6/uL (ref 3.80–5.10)
RDW: 13.2 % (ref 11.0–15.0)
Total Lymphocyte: 12 %
WBC: 9.6 10*3/uL (ref 3.8–10.8)

## 2018-12-20 ENCOUNTER — Ambulatory Visit (INDEPENDENT_AMBULATORY_CARE_PROVIDER_SITE_OTHER): Payer: Medicare Other | Admitting: Family Medicine

## 2018-12-20 ENCOUNTER — Encounter (INDEPENDENT_AMBULATORY_CARE_PROVIDER_SITE_OTHER): Payer: Self-pay | Admitting: Family Medicine

## 2018-12-20 VITALS — BP 145/78 | HR 77 | Resp 12 | Ht 62.0 in | Wt 249.0 lb

## 2018-12-20 DIAGNOSIS — M25561 Pain in right knee: Secondary | ICD-10-CM

## 2018-12-20 MED ORDER — METHYLPREDNISOLONE ACETATE 40 MG/ML IJ SUSP
40.0000 mg | INTRAMUSCULAR | Status: AC | PRN
  Administered 2018-12-20: 40 mg via INTRA_ARTICULAR

## 2018-12-20 MED ORDER — LIDOCAINE HCL 1 % IJ SOLN
3.0000 mL | INTRAMUSCULAR | Status: AC | PRN
  Administered 2018-12-20: 3 mL

## 2018-12-20 NOTE — Progress Notes (Signed)
Office Visit Note   Patient: Karla Willis           Date of Birth: February 09, 1935           MRN: [id number] Visit Date: 12/20/2018 Requested by: Sandrea Hughs, NP [address] [place], [place] [address] PCP: Lauree Chandler, NP  Subjective: Chief Complaint  Patient presents with  . Right Knee - Pain  . Knee Pain    pt stated --shooting pain, swelling, and worse when walking for 1 week---no injury.Pt seen PCP-prescribe Rx voltaren Gel and x-rays 12/16/18.    HPI: She is here with right knee pain.  Symptoms started about a week or 2 with no injury.  Severe pain, hardly able to bear weight.  She has been using Voltaren gel with minimal improvement.  Over the weekend she rested most of the time and today she is feeling a whole lot better.  She is now using a cane instead of a walker.  She still has pain but it is much more tolerable.  She has a history of gout in the past affecting her feet.  She takes allopurinol chronically for that.  She has never had an attack in her knee to her knowledge.               ROS: Other systems were reviewed and are negative.  Objective: Vital Signs: BP (!) 145/78 (BP Location: Right Arm, Patient Position: Sitting, Cuff Size: Normal)   Pulse 77   Resp 12   Ht 5\' 2"  (1.575 m)   Wt 249 lb (112.9 kg)   SpO2 98%   BMI 45.54 kg/m   Physical Exam:  Right knee: 2+ effusion with no warmth or erythema.  Diffusely tender around the medial and lateral joint lines.  2+ patellofemoral crepitus.  Imaging: None today, recent x-rays show tricompartmental DJD with a very large osteophyte at the superior lateral patella.  Assessment & Plan: 1.  Right knee pain with effusion, suspicious for gout attack.  She has underlying tricompartmental DJD. -Discussed options with patient, she would like to proceed with a cortisone injection.  She will follow-up as needed.   Follow-Up Instructions: No follow-ups on file.      Procedures: Large Joint Inj on 12/20/2018  10:09 AM Indications: pain and joint swelling Details: 25 G 1.5 in needle, medial approach Medications: 3 mL lidocaine 1 %; 40 mg methylPREDNISolone acetate 40 MG/ML Aspirate: clear and yellow Consent was given by the patient.      No notes on file    PMFS History: Patient Active Problem List   Diagnosis Date Noted  . History of breast cancer 05/20/2017  . Idiopathic gout 05/20/2017  . Hyperlipidemia LDL goal <100 05/20/2017  . Bilateral edema of lower extremity 05/20/2017  . Acute renal failure (ARF) ([facility])   . Acute on chronic renal failure ([facility]) 05/25/2016  . ARF (acute renal failure) ([facility]) 05/25/2016  . Dyspnea on exertion 05/25/2016  . Anemia 05/25/2016  . Localized primary osteoarthritis of right shoulder region 05/21/2016  . Osteoarthritis, multiple sites 03/09/2015  . Hypothyroidism 03/09/2015  . Goiter 03/09/2015  . Essential hypertension, benign 03/09/2015  . Diabetes mellitus, type 2 ([facility]) 03/09/2015  . Breast cancer of lower-inner quadrant of right female breast ([facility]) 01/02/2014  . DCIS (ductal carcinoma in situ) of breast 11/11/2013   Past Medical History:  Diagnosis Date  . Allergy   . Arthritis   . Breast cancer ([facility]) 10/24/13   right upper inner-  DCIS  . Cancer ([facility])   . Diabetes mellitus without complication ([facility])   . GERD (gastroesophageal reflux disease)    occ  . Gout   . History of hiatal hernia   . HOH (hard of hearing)   . Hypertension   . Hypothyroidism   . Pneumonia    hx  . Thyroid disease   . Wears glasses     Family History  Problem Relation Age of Onset  . Diabetes Mother   . Arthritis Father   . Seizures Sister   . Cancer Sister        breast  . Breast cancer Sister   . Multiple sclerosis Son        Deceased     Past Surgical History:  Procedure Laterality Date  . BREAST BIOPSY Right 10/26/2013  . BREAST LUMPECTOMY Right 12/16/2013  . BREAST LUMPECTOMY WITH NEEDLE LOCALIZATION Right 12/16/2013   Procedure: BREAST  LUMPECTOMY WITH NEEDLE LOCALIZATION;  Surgeon: Edward Jolly, MD;  Location: [facility];  Service: General;  Laterality: Right;  . CATARACT EXTRACTION Left 12/30/2017  . CHOLECYSTECTOMY  1970  . COLONOSCOPY    . FINGER SURGERY     right hand-  . gallbladder sugery    . RE-EXCISION OF BREAST LUMPECTOMY Right 01/30/2014   Procedure: RE-EXCISION OF BREAST LUMPECTOMY;  Surgeon: Edward Jolly, MD;  Location: [facility];  Service: General;  Laterality: Right;  . RE-EXCISION OF BREAST LUMPECTOMY Right 03/06/2014   Procedure: RE-EXCISION OF BREAST LUMPECTOMY;  Surgeon: Edward Jolly, MD;  Location: [facility];  Service: General;  Laterality: Right;  . SHOULDER ARTHROSCOPY  2011   left  . THYROID SURGERY  1970  . TOTAL SHOULDER ARTHROPLASTY Right 05/21/2016  . TOTAL SHOULDER ARTHROPLASTY Right 05/21/2016   Procedure: RIGHT TOTAL SHOULDER ARTHROPLASTY;  Surgeon: Ninetta Lights, MD;  Location: [facility];  Service: Orthopedics;  Laterality: Right;  . TUBAL LIGATION     Social History   Occupational History  . Not on file  Tobacco Use  . Smoking status: Never Smoker  . Smokeless tobacco: Never Used  Substance and Sexual Activity  . Alcohol use: No    Alcohol/week: 0.0 standard drinks  . Drug use: No  . Sexual activity: Not Currently    Comment: menarche age 24, G36, P38, first live birth age 20, menopause at 31, no HRT

## 2018-12-21 ENCOUNTER — Telehealth: Payer: Self-pay | Admitting: *Deleted

## 2018-12-21 NOTE — Telephone Encounter (Signed)
cortisone injection will treat gout flare, she can follow up in office in 3 weeks and we can follow up lab work (uric acid) and discuss pain. She may need to increase allopurinol based on lab.

## 2018-12-21 NOTE — Telephone Encounter (Signed)
Patient scheduled an appointment to follow up gout pain

## 2018-12-21 NOTE — Telephone Encounter (Signed)
Patient called and stated that she has Knee pain and saw the Orthopaedic, he gave her a Cortisone shot and told her that he thinks it is gout flare. Patient is currently taking Allopurinol for gout but wonders if there is something else she can take. Please Advise.

## 2018-12-22 ENCOUNTER — Other Ambulatory Visit: Payer: Self-pay | Admitting: Internal Medicine

## 2018-12-30 ENCOUNTER — Other Ambulatory Visit: Payer: Self-pay | Admitting: Hematology and Oncology

## 2019-01-10 ENCOUNTER — Other Ambulatory Visit: Payer: Self-pay | Admitting: Nurse Practitioner

## 2019-01-10 ENCOUNTER — Ambulatory Visit: Payer: Self-pay

## 2019-01-10 ENCOUNTER — Encounter: Payer: Self-pay | Admitting: Family

## 2019-01-10 NOTE — Telephone Encounter (Signed)
Verified [facility] [employer] last filled 11/04/2018 was last prescribed by Janett Billow a total of 90 tabs. Last appointment was 12/15/2018. Next appointment is with Dinah on 01/12/2019. Patient is requesting a refill and still used [employer].

## 2019-01-11 ENCOUNTER — Encounter: Payer: Self-pay | Admitting: Family

## 2019-01-11 ENCOUNTER — Ambulatory Visit (INDEPENDENT_AMBULATORY_CARE_PROVIDER_SITE_OTHER): Payer: Medicare Other | Admitting: Family

## 2019-01-11 VITALS — BP 148/78 | HR 77 | Temp 98.1°F | Ht 62.0 in | Wt 252.4 lb

## 2019-01-11 DIAGNOSIS — Z853 Personal history of malignant neoplasm of breast: Secondary | ICD-10-CM

## 2019-01-11 DIAGNOSIS — Z0000 Encounter for general adult medical examination without abnormal findings: Secondary | ICD-10-CM

## 2019-01-11 NOTE — Patient Instructions (Signed)
Ms. Nantz , Thank you for taking time to come for your Medicare Wellness Visit. I appreciate your ongoing commitment to your health goals. Please review the following plan we discussed and let me know if I can assist you in the future.   Screening recommendations/referrals: Colonoscopy up to date  Mammogram: Ordered today  Bone Density: up to date  Recommended yearly ophthalmology/optometry visit for glaucoma screening and checkup Recommended yearly dental visit for hygiene and checkup  Vaccinations: Influenza vaccine: state up to date  Pneumococcal vaccine: up to date  Tdap vaccine: Up to date due 04/18/2026  Shingles vaccine: Up to date   Advanced directives: copy in chart   Conditions/risks identified:Advance age > 46 yrs ,Type 2 DM,sedentary life style,Obesity,Hyperytension    Next appointment: 1 year    Preventive Care 64 Years and Older, Female Preventive care refers to lifestyle choices and visits with your health care provider that can promote health and wellness. What does preventive care include?  A yearly physical exam. This is also called an annual well check.  Dental exams once or twice a year.  Routine eye exams. Ask your health care provider how often you should have your eyes checked.  Personal lifestyle choices, including:  Daily care of your teeth and gums.  Regular physical activity.  Eating a healthy diet.  Avoiding tobacco and drug use.  Limiting alcohol use.  Practicing safe sex.  Taking low-dose aspirin every day.  Taking vitamin and mineral supplements as recommended by your health care provider. What happens during an annual well check? The services and screenings done by your health care provider during your annual well check will depend on your age, overall health, lifestyle risk factors, and family history of disease. Counseling  Your health care provider may ask you questions about your:  Alcohol use.  Tobacco use.  Drug  use.  Emotional well-being.  Home and relationship well-being.  Sexual activity.  Eating habits.  History of falls.  Memory and ability to understand (cognition).  Work and work Statistician.  Reproductive health. Screening  You may have the following tests or measurements:  Height, weight, and BMI.  Blood pressure.  Lipid and cholesterol levels. These may be checked every 5 years, or more frequently if you are over 21 years old.  Skin check.  Lung cancer screening. You may have this screening every year starting at age 24 if you have a 30-pack-year history of smoking and currently smoke or have quit within the past 15 years.  Fecal occult blood test (FOBT) of the stool. You may have this test every year starting at age 48.  Flexible sigmoidoscopy or colonoscopy. You may have a sigmoidoscopy every 5 years or a colonoscopy every 10 years starting at age 82.  Hepatitis C blood test.  Hepatitis B blood test.  Sexually transmitted disease (STD) testing.  Diabetes screening. This is done by checking your blood sugar (glucose) after you have not eaten for a while (fasting). You may have this done every 1-3 years.  Bone density scan. This is done to screen for osteoporosis. You may have this done starting at age 10.  Mammogram. This may be done every 1-2 years. Talk to your health care provider about how often you should have regular mammograms. Talk with your health care provider about your test results, treatment options, and if necessary, the need for more tests. Vaccines  Your health care provider may recommend certain vaccines, such as:  Influenza vaccine. This is recommended every  year.  Tetanus, diphtheria, and acellular pertussis (Tdap, Td) vaccine. You may need a Td booster every 10 years.  Zoster vaccine. You may need this after age 78.  Pneumococcal 13-valent conjugate (PCV13) vaccine. One dose is recommended after age 28.  Pneumococcal polysaccharide  (PPSV23) vaccine. One dose is recommended after age 43. Talk to your health care provider about which screenings and vaccines you need and how often you need them. This information is not intended to replace advice given to you by your health care provider. Make sure you discuss any questions you have with your health care provider. Document Released: 11/23/2015 Document Revised: 07/16/2016 Document Reviewed: 08/28/2015 Elsevier Interactive Patient Education  2017 [employer] Prevention in the Home Falls can cause injuries. They can happen to people of all ages. There are many things you can do to make your home safe and to help prevent falls. What can I do on the outside of my home?  Regularly fix the edges of walkways and driveways and fix any cracks.  Remove anything that might make you trip as you walk through a door, such as a raised step or threshold.  Trim any bushes or trees on the path to your home.  Use bright outdoor lighting.  Clear any walking paths of anything that might make someone trip, such as rocks or tools.  Regularly check to see if handrails are loose or broken. Make sure that both sides of any steps have handrails.  Any raised decks and porches should have guardrails on the edges.  Have any leaves, snow, or ice cleared regularly.  Use sand or salt on walking paths during winter.  Clean up any spills in your garage right away. This includes oil or grease spills. What can I do in the bathroom?  Use night lights.  Install grab bars by the toilet and in the tub and shower. Do not use towel bars as grab bars.  Use non-skid mats or decals in the tub or shower.  If you need to sit down in the shower, use a plastic, non-slip stool.  Keep the floor dry. Clean up any water that spills on the floor as soon as it happens.  Remove soap buildup in the tub or shower regularly.  Attach bath mats securely with double-sided non-slip rug tape.  Do not have  throw rugs and other things on the floor that can make you trip. What can I do in the bedroom?  Use night lights.  Make sure that you have a light by your bed that is easy to reach.  Do not use any sheets or blankets that are too big for your bed. They should not hang down onto the floor.  Have a firm chair that has side arms. You can use this for support while you get dressed.  Do not have throw rugs and other things on the floor that can make you trip. What can I do in the kitchen?  Clean up any spills right away.  Avoid walking on wet floors.  Keep items that you use a lot in easy-to-reach places.  If you need to reach something above you, use a strong step stool that has a grab bar.  Keep electrical cords out of the way.  Do not use floor polish or wax that makes floors slippery. If you must use wax, use non-skid floor wax.  Do not have throw rugs and other things on the floor that can make you trip. What can  I do with my stairs?  Do not leave any items on the stairs.  Make sure that there are handrails on both sides of the stairs and use them. Fix handrails that are broken or loose. Make sure that handrails are as long as the stairways.  Check any carpeting to make sure that it is firmly attached to the stairs. Fix any carpet that is loose or worn.  Avoid having throw rugs at the top or bottom of the stairs. If you do have throw rugs, attach them to the floor with carpet tape.  Make sure that you have a light switch at the top of the stairs and the bottom of the stairs. If you do not have them, ask someone to add them for you. What else can I do to help prevent falls?  Wear shoes that:  Do not have high heels.  Have rubber bottoms.  Are comfortable and fit you well.  Are closed at the toe. Do not wear sandals.  If you use a stepladder:  Make sure that it is fully opened. Do not climb a closed stepladder.  Make sure that both sides of the stepladder are  locked into place.  Ask someone to hold it for you, if possible.  Clearly mark and make sure that you can see:  Any grab bars or handrails.  First and last steps.  Where the edge of each step is.  Use tools that help you move around (mobility aids) if they are needed. These include:  Canes.  Walkers.  Scooters.  Crutches.  Turn on the lights when you go into a dark area. Replace any light bulbs as soon as they burn out.  Set up your furniture so you have a clear path. Avoid moving your furniture around.  If any of your floors are uneven, fix them.  If there are any pets around you, be aware of where they are.  Review your medicines with your doctor. Some medicines can make you feel dizzy. This can increase your chance of falling. Ask your doctor what other things that you can do to help prevent falls. This information is not intended to replace advice given to you by your health care provider. Make sure you discuss any questions you have with your health care provider. Document Released: 08/23/2009 Document Revised: 04/03/2016 Document Reviewed: 12/01/2014 Elsevier Interactive Patient Education  2017 [place] A low-purine eating plan involves making food choices to limit your intake of purine. Purine is a kind of uric acid. Too much uric acid in your blood can cause certain conditions, such as gout and kidney stones. Eating a low-purine diet can help control these conditions. What are tips for following this plan? Reading food labels   Avoid foods with saturated or Trans fat.  Check the ingredient list of grains-based foods, such as bread and cereal, to make sure that they contain whole grains.  Check the ingredient list of sauces or soups to make sure they do not contain meat or fish.  When choosing soft drinks, check the ingredient list to make sure they do not contain high-fructose corn syrup. Shopping  Buy plenty of fresh fruits  and vegetables.  Avoid buying canned or fresh fish.  Buy dairy products labeled as low-fat or nonfat.  Avoid buying premade or processed foods. These foods are often high in fat, salt (sodium), and added sugar. Cooking  Use olive oil instead of butter when cooking. Oils like olive  oil, canola oil, and sunflower oil contain healthy fats. Meal planning  Learn which foods do or do not affect you. If you find out that a food tends to cause your gout symptoms to flare up, avoid eating that food. You can enjoy foods that do not cause problems. If you have any questions about a food item, talk with your dietitian or health care provider.  Limit foods high in fat, especially saturated fat. Fat makes it harder for your body to get rid of uric acid.  Choose foods that are lower in fat and are lean sources of protein. General guidelines  Limit alcohol intake to no more than 1 drink a day for nonpregnant women and 2 drinks a day for men. One drink equals 12 oz of beer, 5 oz of wine, or 1 oz of hard liquor. Alcohol can affect the way your body gets rid of uric acid.  Drink plenty of water to keep your urine clear or pale yellow. Fluids can help remove uric acid from your body.  If directed by your health care provider, take a vitamin C supplement.  Work with your health care provider and dietitian to develop a plan to achieve or maintain a healthy weight. Losing weight can help reduce uric acid in your blood. What foods are recommended? The items listed may not be a complete list. Talk with your dietitian about what dietary choices are best for you. Foods low in purines Foods low in purines do not need to be limited. These include:  All fruits.  All low-purine vegetables, pickles, and olives.  Breads, pasta, rice, cornbread, and popcorn. Cake and other baked goods.  All dairy foods.  Eggs, nuts, and nut butters.  Spices and condiments, such as salt, herbs, and vinegar.  Plant oils,  butter, and margarine.  Water, sugar-free soft drinks, tea, coffee, and cocoa.  Vegetable-based soups, broths, sauces, and gravies. Foods moderate in purines Foods moderate in purines should be limited to the amounts listed.   cup of asparagus, cauliflower, spinach, mushrooms, or green peas, each day.  2/3 cup uncooked oatmeal, each day.   cup dry wheat bran or wheat germ, each day.  2-3 ounces of meat or poultry, each day.  4-6 ounces of shellfish, such as crab, lobster, oysters, or shrimp, each day.  1 cup cooked beans, peas, or lentils, each day.  Soup, broths, or bouillon made from meat or fish. Limit these foods as much as possible. What foods are not recommended? The items listed may not be a complete list. Talk with your dietitian about what dietary choices are best for you. Limit your intake of foods high in purines, including:  Beer and other alcohol.  Meat-based gravy or sauce.  Canned or fresh fish, such as: ? Anchovies, sardines, herring, and tuna. ? Mussels and scallops. ? Codfish, trout, and haddock.  Berniece Salines.  Organ meats, such as: ? Liver or kidney. ? Tripe. ? Sweetbreads (thymus gland or pancreas).  Wild Clinical biochemist.  Yeast or yeast extract supplements.  Drinks sweetened with high-fructose corn syrup. Summary  Eating a low-purine diet can help control conditions caused by too much uric acid in the body, such as gout or kidney stones.  Choose low-purine foods, limit alcohol, and limit foods high in fat.  You will learn over time which foods do or do not affect you. If you find out that a food tends to cause your gout symptoms to flare up, avoid eating that food. This information  is not intended to replace advice given to you by your health care provider. Make sure you discuss any questions you have with your health care provider. Document Released: 02/21/2011 Document Revised: 12/10/2016 Document Reviewed: 12/10/2016 Elsevier Interactive  Patient Education  2019 [employer].

## 2019-01-11 NOTE — Progress Notes (Signed)
Subjective:   Karla Willis is a 84 y.o. female who presents for Medicare Annual (Subsequent) preventive examination.  Review of Systems:   Cardiac Risk Factors include: advanced age (>29men, >56 women);diabetes mellitus;sedentary lifestyle;obesity (BMI >30kg/m2);hypertension     Objective:     Vitals: BP (!) 148/78   Pulse 77   Temp 98.1 F (36.7 C) (Oral)   Ht 5\' 2"  (1.575 m)   Wt 252 lb 6.4 oz (114.5 kg)   SpO2 98%   BMI 46.16 kg/m   Body mass index is 46.16 kg/m.  Advanced Directives 01/11/2019 12/15/2018 11/26/2018 10/04/2018 04/22/2018 01/07/2018 05/20/2017  Does Patient Have a Medical Advance Directive? Yes Yes Yes Yes Yes Yes Yes  Type of Paramedic of [place];Living will;Out of facility DNR (pink MOST or yellow form) Out of facility DNR (pink MOST or yellow form);Living will [place];Living will [place];Living will;Out of facility DNR (pink MOST or yellow form) [place];Living will;Out of facility DNR (pink MOST or yellow form) Healthcare Power of [place];Out of facility DNR (pink MOST or yellow form)  Does patient want to make changes to medical advance directive? No - Patient declined No - Patient declined No - Patient declined - - No - Patient declined No - Patient declined  Copy of [place] in Chart? Yes - validated most recent copy scanned in chart (See row information) Yes - validated most recent copy scanned in chart (See row information) Yes - validated most recent copy scanned in chart (See row information) Yes - validated most recent copy scanned in chart (See row information) Yes Yes Yes  Pre-existing out of facility DNR order (yellow form or pink MOST form) Yellow form placed in chart (order not valid for inpatient use);Pink MOST form placed in chart (order not valid for inpatient use) - - Yellow form placed in chart (order not valid for  inpatient use);Pink MOST form placed in chart (order not valid for inpatient use) Yellow form placed in chart (order not valid for inpatient use) - Pink MOST form placed in chart (order not valid for inpatient use)    Tobacco Social History   Tobacco Use  Smoking Status Never Smoker  Smokeless Tobacco Never Used     Counseling given: Not Answered   Clinical Intake:  Pre-visit preparation completed: No  Pain : 0-10 Pain Score: 4  Pain Type: Chronic pain Pain Location: Knee Pain Orientation: Right Pain Radiating Towards: no  Pain Descriptors / Indicators: Aching Pain Onset: More than a month ago Pain Frequency: Constant Pain Relieving Factors: none  Effect of Pain on Daily Activities: yes (walking )  Pain Relieving Factors: none   BMI - recorded: 46.16 Nutritional Status: BMI > 30  Obese Nutritional Risks: None Diabetes: Yes CBG done?: No Did pt. bring in CBG monitor from home?: No(recalls CBG in the 100's-110's in the morning )  How often do you need to have someone help you when you read instructions, pamphlets, or other written materials from your doctor or pharmacy?: 1 - Never What is the last grade level you completed in school?: 12 grade   Interpreter Needed?: No  Information entered by :: Keyshawn Hellwig FNP-C   Past Medical History:  Diagnosis Date  . Allergy   . Arthritis   . 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 Cancer ([place])   . Diabetes mellitus without complication ([place])   .  GERD (gastroesophageal reflux disease)    occ  . Gout   . History of hiatal hernia   . HOH (hard of hearing)   . Hypertension   . Hypothyroidism   . Pneumonia    hx  . Thyroid disease   . Wears glasses    Past Surgical History:  Procedure Laterality Date  . BREAST BIOPSY Right 10/26/2013  . BREAST LUMPECTOMY Right 12/16/2013  . BREAST LUMPECTOMY WITH NEEDLE LOCALIZATION Right 12/16/2013   Procedure: BREAST LUMPECTOMY WITH NEEDLE LOCALIZATION;   Surgeon: Edward Jolly, MD;  Location: [facility];  Service: General;  Laterality: Right;  . CATARACT EXTRACTION Left 12/30/2017  . CHOLECYSTECTOMY  1970  . COLONOSCOPY    . FINGER SURGERY     right hand-  . gallbladder sugery    . RE-EXCISION OF BREAST LUMPECTOMY Right 01/30/2014   Procedure: RE-EXCISION OF BREAST LUMPECTOMY;  Surgeon: Edward Jolly, MD;  Location: [facility];  Service: General;  Laterality: Right;  . RE-EXCISION OF BREAST LUMPECTOMY Right 03/06/2014   Procedure: RE-EXCISION OF BREAST LUMPECTOMY;  Surgeon: Edward Jolly, MD;  Location: [facility];  Service: General;  Laterality: Right;  . SHOULDER ARTHROSCOPY  2011   left  . THYROID SURGERY  1970  . TOTAL SHOULDER ARTHROPLASTY Right 05/21/2016  . TOTAL SHOULDER ARTHROPLASTY Right 05/21/2016   Procedure: RIGHT TOTAL SHOULDER ARTHROPLASTY;  Surgeon: Ninetta Lights, MD;  Location: [facility];  Service: Orthopedics;  Laterality: Right;  . TUBAL LIGATION     Family History  Problem Relation Age of Onset  . Diabetes Mother   . Arthritis Father   . Seizures Sister   . Cancer Sister        breast  . Breast cancer Sister   . Multiple sclerosis Son        Deceased    Social History   Socioeconomic History  . Marital status: Widowed    Spouse name: Not on file  . Number of children: Not on file  . Years of education: Not on file  . Highest education level: Not on file  Occupational History  . Not on file  Social Needs  . Financial resource strain: Not hard at all  . Food insecurity:    Worry: Never true    Inability: Never true  . Transportation needs:    Medical: No    Non-medical: No  Tobacco Use  . Smoking status: Never Smoker  . Smokeless tobacco: Never Used  Substance and Sexual Activity  . Alcohol use: No    Alcohol/week: 0.0 standard drinks  . Drug use: No  . Sexual activity: Not Currently    Comment: menarche age 36, G11, P8, first live birth  age 67, menopause at 37, no HRT  Lifestyle  . Physical activity:    Days per week: 0 days    Minutes per session: 0 min  . Stress: Not at all  Relationships  . Social connections:    Talks on phone: More than three times a week    Gets together: More than three times a week    Attends religious service: More than 4 times per year    Active member of club or organization: No    Attends meetings of clubs or organizations: Never    Relationship status: Widowed  Other Topics Concern  . Not on file  Social History Narrative   Diet: No      Do you drink/ eat  things with caffeine? Yes      Marital status:    Widowed                           What year were you married ?  1956      Do you live in a house, apartment,assistred living, condo, trailer, etc.)?  apartment      Is it one or more stories?  one      How many persons live in your home ?  3      Do you have any pets in your home ?(please list)  No      Current or past profession:  Bank, Caregiver      Do you exercise?  No                            Type & how often:        Do you have a living will?  Yes      Do you have a DNR form?  Yes                     If not, do you want to discuss one?       Do you have signed POA?HPOA forms?                 If so, please bring to your        appointment       Outpatient Encounter Medications as of 01/11/2019  Medication Sig  . allopurinol (ZYLOPRIM) 100 MG tablet Take one tablet by mouth once daily for gout  . amLODipine (NORVASC) 5 MG tablet TAKE 1 TABLET BY MOUTH EVERY DAY  . diclofenac sodium (VOLTAREN) 1 % GEL Apply 4 g topically 4 (four) times daily as needed.  Marland Kitchen glipiZIDE (GLUCOTROL XL) 5 MG 24 hr tablet Take one tablet by mouth once daily with breakfast  . levothyroxine (SYNTHROID, LEVOTHROID) 100 MCG tablet TAKE 1 TABLET BY MOUTH ONCE DAILY BEFORE BREAKFAST  . losartan (COZAAR) 50 MG tablet Take 1 tablet (50 mg total) by mouth daily.  Marland Kitchen nystatin (MYCOSTATIN/NYSTOP)  powder Apply topically 3 (three) times daily.  . tamoxifen (NOLVADEX) 20 MG tablet TAKE 1 TABLET BY MOUTH ONCE DAILY  . traMADol (ULTRAM) 50 MG tablet TAKE 1 TABLET BY MOUTH 3 TIMES DAILY AS NEEDED FOR PAIN  . triamcinolone cream (KENALOG) 0.1 % Apply 1 application topically 2 (two) times daily.  . [DISCONTINUED] feeding supplement, GLUCERNA SHAKE, (GLUCERNA SHAKE) LIQD Take 237 mLs by mouth 2 (two) times daily between meals. (Patient taking differently: Take 237 mLs by mouth as needed. Patient states she can not afford to take all the time)   No facility-administered encounter medications on file as of 01/11/2019.     Activities of Daily Living In your present state of health, do you have any difficulty performing the following activities: 01/11/2019  Hearing? N  Vision? N  Difficulty concentrating or making decisions? N  Walking or climbing stairs? Y  Comment knee pain   Dressing or bathing? N  Doing errands, shopping? N  Preparing Food and eating ? N  Using the Toilet? N  In the past six months, have you accidently leaked urine? N  Do you have problems with loss of bowel control? N  Managing your Medications? N  Managing your Finances? N  Housekeeping or managing your Housekeeping?  N  Some recent data might be hidden    Patient Care Team: Lauree Chandler, NP as PCP - General (Geriatric Medicine) Nicholas Lose, MD as Consulting Physician (Hematology and Oncology) Specialists, [place] as Consulting Physician (Orthopedic Surgery)    Assessment:   This is a routine wellness examination for Karla Willis.  Exercise Activities and Dietary recommendations Current Exercise Habits: The patient does not participate in regular exercise at present  Goals    . DIET - INCREASE WATER INTAKE     Patient wiill keep water bottles with her to increase water intake    . Increase water intake     Starting 01/14/17, I will attempt to increase my water intake from 4 bottles of water to  6 bottles of water per day.        Fall Risk Fall Risk  01/11/2019 12/15/2018 10/04/2018 04/22/2018 01/22/2018  Falls in the past year? 0 0 0 No No  Number falls in past yr: 0 0 - - -  Injury with Fall? 0 0 - - -   Is the patient's home free of loose throw rugs in walkways, pet beds, electrical cords, etc?   no      Grab bars in the bathroom? yes      Handrails on the stairs?   no      Adequate lighting?   yes  Depression Screen PHQ 2/9 Scores 01/11/2019 01/22/2018 01/07/2018 01/14/2017  PHQ - 2 Score 0 0 0 0     Cognitive Function MMSE - Mini Mental State Exam 01/11/2019 01/07/2018 01/14/2017 01/11/2016  Not completed: - - - (No Data)  Orientation to time 5 5 5 5   Orientation to Place 5 5 5 5   Registration 3 3 3 3   Attention/ Calculation 5 5 5 5   Recall 3 1 3 2   Language- name 2 objects 2 2 2 2   Language- repeat 1 1 1 1   Language- follow 3 step command 3 3 3 3   Language- read & follow direction 1 1 1 1   Write a sentence 1 1 1 1   Copy design 1 1 1 1   Total score 30 28 30 29     Immunization History  Administered Date(s) Administered  . Influenza,inj,Quad PF,6+ Mos 09/07/2015, 07/25/2016, 09/22/2017  . Influenza-Unspecified 08/10/2014  . Pneumococcal Conjugate-13 01/14/2017  . Pneumococcal-Unspecified 11/10/2013  . Tdap 04/18/2016  . Zoster 04/18/2012    Qualifies for Shingles Vaccine? Up to date   Screening Tests Health Maintenance  Topic Date Due  . INFLUENZA VACCINE  06/10/2018  . FOOT EXAM  05/26/2019  . HEMOGLOBIN A1C  05/27/2019  . OPHTHALMOLOGY EXAM  08/11/2019  . TETANUS/TDAP  04/18/2026  . DEXA SCAN  Completed  . PNA vac Low Risk Adult  Completed    Cancer Screenings: Lung: Low Dose CT Chest recommended if Age 60-80 years, 30 pack-year currently smoking OR have quit w/in 15years. Patient does not qualify. Breast:  Up to date on Mammogram? Yes   Up to date of Bone Density/Dexa? Yes Colorectal: 5 years ago.   Additional Screenings:  Hepatitis C Screening: low  risk      Plan:   - Low carbohydrate,low saturated fats,high vegetable diet and increase physical activity/exercise.  - mammogram    I have personally reviewed and noted the following in the patient's chart:   . Medical and social history . Use of alcohol, tobacco or illicit drugs  . Current medications and supplements . Functional ability and status . Nutritional  status . Physical activity . Advanced directives . List of other physicians . Hospitalizations, surgeries, and ER visits in previous 12 months . Vitals . Screenings to include cognitive, depression, and falls . Referrals and appointments  In addition, I have reviewed and discussed with patient certain preventive protocols, quality metrics, and best practice recommendations. A written personalized care plan for preventive services as well as general preventive health recommendations were provided to patient.   Sandrea Hughs, NP  01/11/2019

## 2019-01-12 ENCOUNTER — Other Ambulatory Visit: Payer: Self-pay | Admitting: Nurse Practitioner

## 2019-01-12 ENCOUNTER — Ambulatory Visit: Payer: Self-pay | Admitting: Family

## 2019-01-12 ENCOUNTER — Other Ambulatory Visit: Payer: Self-pay | Admitting: *Deleted

## 2019-01-12 DIAGNOSIS — Z853 Personal history of malignant neoplasm of breast: Secondary | ICD-10-CM

## 2019-01-12 MED ORDER — LEVOTHYROXINE SODIUM 100 MCG PO TABS: ORAL_TABLET | ORAL | 1 refills | Status: DC

## 2019-01-12 NOTE — Telephone Encounter (Signed)
[facility]

## 2019-01-25 ENCOUNTER — Other Ambulatory Visit: Payer: Self-pay

## 2019-01-25 ENCOUNTER — Ambulatory Visit
Admission: RE | Admit: 2019-01-25 | Discharge: 2019-01-25 | Disposition: A | Discharge status: Home / Self Care | Payer: Medicare Other | Source: Ambulatory Visit | Attending: Nurse Practitioner | Admitting: Nurse Practitioner

## 2019-01-25 DIAGNOSIS — Z853 Personal history of malignant neoplasm of breast: Secondary | ICD-10-CM

## 2019-01-25 DIAGNOSIS — R928 Other abnormal and inconclusive findings on diagnostic imaging of breast: Secondary | ICD-10-CM | POA: Diagnosis not present

## 2019-02-01 ENCOUNTER — Other Ambulatory Visit: Payer: Self-pay | Admitting: Nurse Practitioner

## 2019-02-04 ENCOUNTER — Other Ambulatory Visit: Payer: Self-pay | Admitting: Nurse Practitioner

## 2019-02-04 DIAGNOSIS — E119 Type 2 diabetes mellitus without complications: Secondary | ICD-10-CM

## 2019-03-06 ENCOUNTER — Encounter: Payer: Self-pay | Admitting: Nurse Practitioner

## 2019-03-11 ENCOUNTER — Other Ambulatory Visit: Payer: Self-pay | Admitting: Nurse Practitioner

## 2019-04-07 NOTE — Assessment & Plan Note (Signed)
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 Patient did not undergo radiation therapy he 100% PR 60% positive. She was started on tamoxifen in April 2015.  Tamoxifen toxicities: 1. Hot flashes occasional Patient has 1 more year of tamoxifen therapy left.  Breast cancer surveillance: 1. Mammograms  01/25/2019: Benign, breast density category B 2. Breast exam 5/30/2019is normal  Hypertension and diabetes: Being managed by her primary care physician Weight loss:Patient had significant weight losslast year but has stabilized at this current weight. She is still obese. Hospitalization07/15/2017 due to acute on chronic renal failure  Left leg pain: Probably related to sciatica. Return to clinic in 1 year for follow-up

## 2019-04-11 ENCOUNTER — Telehealth: Payer: Self-pay | Admitting: Hematology and Oncology

## 2019-04-11 NOTE — Telephone Encounter (Signed)
Called patient regarding upcoming Webex appointment, patient does not have access for Webex or Doximity. This will be a telephone visit.

## 2019-04-12 ENCOUNTER — Other Ambulatory Visit: Payer: Self-pay | Admitting: Nurse Practitioner

## 2019-04-12 NOTE — Telephone Encounter (Signed)
Last filled on 01/10/2019  [facility] Database verified and compliance confirmed

## 2019-04-13 ENCOUNTER — Telehealth: Payer: Self-pay | Admitting: Hematology and Oncology

## 2019-04-13 NOTE — Progress Notes (Signed)
  HEMATOLOGY-ONCOLOGY TELEPHONE VISIT PROGRESS NOTE  I connected with Cyndy Freeze on 04/14/2019 at  2:45 PM EDT by telephone and verified that I am speaking with the correct person using two identifiers.  I discussed the limitations, risks, security and privacy concerns of performing an evaluation and management service by telephone and the availability of in person appointments.  I also discussed with the patient that there may be a patient responsible charge related to this service. The patient expressed understanding and agreed to proceed.   History of Present Illness: Karla Willis is a 84 y.o. female with above-mentioned history of right breast DCIS treated with lumpectomy and who is currently on oral antiestrogen therapy with tamoxifen. I last saw her a year ago. Her most recent mammogram on 01/25/19 showed no evidence of malignancy bilaterally. She is over the phone today for annual follow-up.  She does not report any pain or discomfort.  Denies any lumps or nodules in breast.  Denies any problems with tamoxifen.  She completed 5 years of therapy.    Breast cancer of lower-inner quadrant of right female breast ([place])   10/19/2013 Initial Diagnosis    DCIS ER/PR positive low grade    12/16/2013 Surgery    Lumpectomy right breast: DCIS with calcifications low-grade 1.6 cm focally 0.1 cm to lateral margin, excision of right posterior margin continued to show DCIS that led to the third surgery for reexcision which was negative for DCIS ER 100% PR 60%    01/09/2014 -  Anti-estrogen oral therapy    Tamoxifen 20 mg daily (patient not a candidate for radiation because of body habitus)     Observations/Objective:  No clinical evidence of breast cancer recurrence   Assessment Plan:  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 Patient did not undergo radiation therapy he 100% PR 60% positive. She was started on tamoxifen  in April 2015 completed June 2020.  Tamoxifen toxicities: Hot flashes occasional Patient completed 5 years of tamoxifen therapy.  She will discontinue this at this time.  Breast cancer surveillance: 1. Mammograms  01/25/2019: Benign, breast density category B 2. Breast exam 3/17/2020is normal  Hypertension and diabetes: Being managed by her primary care physician Weight loss:Patient had significant weight losslast year but has stabilized at this current weight. She is still obese. Hospitalization07/15/2017 due to acute on chronic renal failure  Left leg pain: Probably related to sciatica. Return to clinic in 1 year for follow-up   I discussed the assessment and treatment plan with the patient. The patient was provided an opportunity to ask questions and all were answered. The patient agreed with the plan and demonstrated an understanding of the instructions. The patient was advised to call back or seek an in-person evaluation if the symptoms worsen or if the condition fails to improve as anticipated.   I provided 12 minutes of non-face-to-face time during this encounter.   Rulon Eisenmenger, MD 04/14/2019    I, Molly Dorshimer, am acting as scribe for Nicholas Lose, MD.  I have reviewed the above documentation for accuracy and completeness, and I agree with the above.

## 2019-04-13 NOTE — Telephone Encounter (Signed)
Contacted pt to verify telephone visit for pre reg °

## 2019-04-14 ENCOUNTER — Inpatient Hospital Stay: Payer: Medicare Other | Attending: Hematology and Oncology | Admitting: Hematology and Oncology

## 2019-04-14 DIAGNOSIS — D0511 Intraductal carcinoma in situ of right breast: Secondary | ICD-10-CM | POA: Diagnosis not present

## 2019-04-21 ENCOUNTER — Other Ambulatory Visit: Payer: Self-pay

## 2019-04-21 ENCOUNTER — Emergency Department (HOSPITAL_COMMUNITY): Payer: Medicare Other

## 2019-04-21 ENCOUNTER — Encounter: Payer: Self-pay | Admitting: Nurse Practitioner

## 2019-04-21 ENCOUNTER — Encounter (HOSPITAL_COMMUNITY): Payer: Self-pay

## 2019-04-21 ENCOUNTER — Ambulatory Visit (INDEPENDENT_AMBULATORY_CARE_PROVIDER_SITE_OTHER): Payer: Medicare Other | Admitting: Nurse Practitioner

## 2019-04-21 ENCOUNTER — Emergency Department (HOSPITAL_COMMUNITY)
Admission: EM | Admit: 2019-04-21 | Discharge: 2019-04-21 | Disposition: A | Discharge status: Home / Self Care | Payer: Medicare Other | Attending: Emergency Medicine | Admitting: Emergency Medicine

## 2019-04-21 ENCOUNTER — Telehealth: Payer: Self-pay | Admitting: *Deleted

## 2019-04-21 DIAGNOSIS — R350 Frequency of micturition: Secondary | ICD-10-CM | POA: Diagnosis not present

## 2019-04-21 DIAGNOSIS — M5442 Lumbago with sciatica, left side: Secondary | ICD-10-CM

## 2019-04-21 DIAGNOSIS — Z96611 Presence of right artificial shoulder joint: Secondary | ICD-10-CM | POA: Insufficient documentation

## 2019-04-21 DIAGNOSIS — M5432 Sciatica, left side: Secondary | ICD-10-CM

## 2019-04-21 DIAGNOSIS — E119 Type 2 diabetes mellitus without complications: Secondary | ICD-10-CM | POA: Insufficient documentation

## 2019-04-21 DIAGNOSIS — Z7984 Long term (current) use of oral hypoglycemic drugs: Secondary | ICD-10-CM | POA: Diagnosis not present

## 2019-04-21 DIAGNOSIS — I10 Essential (primary) hypertension: Secondary | ICD-10-CM | POA: Insufficient documentation

## 2019-04-21 DIAGNOSIS — Z79899 Other long term (current) drug therapy: Secondary | ICD-10-CM | POA: Insufficient documentation

## 2019-04-21 DIAGNOSIS — Z853 Personal history of malignant neoplasm of breast: Secondary | ICD-10-CM | POA: Insufficient documentation

## 2019-04-21 DIAGNOSIS — M25852 Other specified joint disorders, left hip: Secondary | ICD-10-CM | POA: Diagnosis not present

## 2019-04-21 DIAGNOSIS — E039 Hypothyroidism, unspecified: Secondary | ICD-10-CM | POA: Diagnosis not present

## 2019-04-21 DIAGNOSIS — Z91010 Allergy to peanuts: Secondary | ICD-10-CM | POA: Insufficient documentation

## 2019-04-21 DIAGNOSIS — M25552 Pain in left hip: Secondary | ICD-10-CM | POA: Diagnosis present

## 2019-04-21 LAB — CBC WITH DIFFERENTIAL/PLATELET
Abs Immature Granulocytes: 0.03 10*3/uL (ref 0.00–0.07)
Basophils Absolute: 0 10*3/uL (ref 0.0–0.1)
Basophils Relative: 0 %
Eosinophils Absolute: 0.3 10*3/uL (ref 0.0–0.5)
Eosinophils Relative: 4 %
HCT: 38 % (ref 36.0–46.0)
Hemoglobin: 12.6 g/dL (ref 12.0–15.0)
Immature Granulocytes: 0 %
Lymphocytes Relative: 20 %
Lymphs Abs: 1.7 10*3/uL (ref 0.7–4.0)
MCH: 29 pg (ref 26.0–34.0)
MCHC: 33.2 g/dL (ref 30.0–36.0)
MCV: 87.6 fL (ref 80.0–100.0)
Monocytes Absolute: 1.1 10*3/uL — ABNORMAL HIGH (ref 0.1–1.0)
Monocytes Relative: 13 %
Neutro Abs: 5.1 10*3/uL (ref 1.7–7.7)
Neutrophils Relative %: 63 %
Platelets: 219 10*3/uL (ref 150–400)
RBC: 4.34 MIL/uL (ref 3.87–5.11)
RDW: 14.4 % (ref 11.5–15.5)
WBC: 8.2 10*3/uL (ref 4.0–10.5)
nRBC: 0 % (ref 0.0–0.2)

## 2019-04-21 LAB — URINALYSIS, ROUTINE W REFLEX MICROSCOPIC
Bacteria, UA: NONE SEEN
Bilirubin Urine: NEGATIVE
Glucose, UA: NEGATIVE mg/dL
Hgb urine dipstick: NEGATIVE
Ketones, ur: NEGATIVE mg/dL
Nitrite: NEGATIVE
Protein, ur: NEGATIVE mg/dL
Specific Gravity, Urine: 1.014 (ref 1.005–1.030)
pH: 5 (ref 5.0–8.0)

## 2019-04-21 LAB — BASIC METABOLIC PANEL
Anion gap: 9 (ref 5–15)
BUN: 17 mg/dL (ref 8–23)
CO2: 25 mmol/L (ref 22–32)
Calcium: 9 mg/dL (ref 8.9–10.3)
Chloride: 106 mmol/L (ref 98–111)
Creatinine, Ser: 1.23 mg/dL — ABNORMAL HIGH (ref 0.44–1.00)
GFR calc Af Amer: 48 mL/min — ABNORMAL LOW (ref >60)
GFR calc non Af Amer: 41 mL/min — ABNORMAL LOW (ref >60)
Glucose, Bld: 89 mg/dL (ref 70–99)
Potassium: 3.7 mmol/L (ref 3.5–5.1)
Sodium: 140 mmol/L (ref 135–145)

## 2019-04-21 MED ORDER — OXYCODONE-ACETAMINOPHEN 5-325 MG PO TABS
1.0000 | ORAL_TABLET | Freq: Once | ORAL | Status: AC
  Administered 2019-04-21: 1 via ORAL
  Filled 2019-04-21: qty 1

## 2019-04-21 MED ORDER — OXYCODONE-ACETAMINOPHEN 5-325 MG PO TABS: 1.0000 | ORAL_TABLET | Freq: Four times a day (QID) | ORAL | 0 refills | Status: DC | PRN

## 2019-04-21 NOTE — Telephone Encounter (Signed)
Karla Willis, Daughter called and stated that her mother told her that you wanted her to give you a call regarding appointment patient had today. Please call daughter at [phone]

## 2019-04-21 NOTE — Progress Notes (Signed)
This service is provided via telemedicine  No vital signs collected/recorded due to the encounter was a telemedicine visit.   Location of patient (ex: home, work):  Home  Patient consents to a telephone visit:  Yes  Location of the provider (ex: office, home):  [facility], Office   Name of any referring provider:  N/A  Names of all persons participating in the telemedicine service and their role in the encounter:  S.Chrae B/CMA, Sherrie Mustache, NP, and Patient   Time spent on call:  5 min with medical assistant        Careteam: Patient Care Team: Lauree Chandler, NP as PCP - General (Geriatric Medicine) Nicholas Lose, MD as Consulting Physician (Hematology and Oncology) [facility], [place] as Consulting Physician (Orthopedic Surgery)  Advanced Directive information    Allergies  Allergen Reactions  . Actos [Pioglitazone] Swelling  . Hydrocodone Nausea Only and Other (See Comments)    "bloated"  . Codeine Rash  . Neurontin [Gabapentin] Palpitations and Other (See Comments)    dizziness  . Other Rash    Nuts--PECANS  . Peanut-Containing Drug Products Rash    Chief Complaint  Patient presents with  . Acute Visit    Sciatic nerve pain. Telephone visit      HPI: Patient is a 84 y.o. female due to having left hip pain.  Report pain for the last 5 days that made it hard for her to move. Could not get out or in the bed without being in pain. It is some better today. Using a walker to get around. Still hurting.  Pain 6/10. Using tylenol and Voltaren gel  Rubbing gel to low back, buttock and down leg which helps a little.  Tylenol "helps a little" No fall or known injury. No new or different activity.  Pain travels down left leg. Hx of low back pain with sciatica.  Reports loss of control of her bladder, can not hold her urine, started when back pain increase. Having a hard time walking, has a hard time moving her foot. Having to  use walker and holding on to things.   Review of Systems:  Review of Systems  Gastrointestinal: Negative for abdominal pain, constipation and diarrhea.  Genitourinary: Positive for frequency.       Incontinence and loss of control of bladder  Musculoskeletal: Positive for back pain and myalgias. Negative for falls and joint pain.  Neurological: Positive for tingling, sensory change, focal weakness and weakness.    Past Medical History:  Diagnosis Date  . Allergy   . Arthritis   . 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 Cancer ([place])   . Diabetes mellitus without complication ([place])   . GERD (gastroesophageal reflux disease)    occ  . Gout   . History of hiatal hernia   . HOH (hard of hearing)   . Hypertension   . Hypothyroidism   . Pneumonia    hx  . Thyroid disease   . Wears glasses    Past Surgical History:  Procedure Laterality Date  . BREAST BIOPSY Right 10/26/2013  . BREAST LUMPECTOMY Right 12/16/2013  . BREAST LUMPECTOMY WITH NEEDLE LOCALIZATION Right 12/16/2013   Procedure: BREAST LUMPECTOMY WITH NEEDLE LOCALIZATION;  Surgeon: Edward Jolly, MD;  Location: [place];  Service: General;  Laterality: Right;  . CATARACT EXTRACTION Left 12/30/2017  . CHOLECYSTECTOMY  1970  . COLONOSCOPY    . FINGER SURGERY  right hand-  . gallbladder sugery    . RE-EXCISION OF BREAST LUMPECTOMY Right 01/30/2014   Procedure: RE-EXCISION OF BREAST LUMPECTOMY;  Surgeon: Edward Jolly, MD;  Location: [place];  Service: General;  Laterality: Right;  . RE-EXCISION OF BREAST LUMPECTOMY Right 03/06/2014   Procedure: RE-EXCISION OF BREAST LUMPECTOMY;  Surgeon: Edward Jolly, MD;  Location: [place];  Service: General;  Laterality: Right;  . SHOULDER ARTHROSCOPY  2011   left  . THYROID SURGERY  1970  . TOTAL SHOULDER ARTHROPLASTY Right 05/21/2016  . TOTAL SHOULDER ARTHROPLASTY Right 05/21/2016   Procedure:  RIGHT TOTAL SHOULDER ARTHROPLASTY;  Surgeon: Ninetta Lights, MD;  Location: [place];  Service: Orthopedics;  Laterality: Right;  . TUBAL LIGATION     Social History:   reports that she has never smoked. She has never used smokeless tobacco. She reports that she does not drink alcohol or use drugs.  Family History  Problem Relation Age of Onset  . Diabetes Mother   . Arthritis Father   . Seizures Sister   . Cancer Sister        breast  . Breast cancer Sister   . Multiple sclerosis Son        Deceased     Medications: Patient's Medications  New Prescriptions   No medications on file  Previous Medications   ALLOPURINOL (ZYLOPRIM) 100 MG TABLET    TAKE 1 TABLET BY MOUTH EVERY DAY FOR GOUT   AMLODIPINE (NORVASC) 5 MG TABLET    TAKE 1 TABLET BY MOUTH EVERY DAY   DICLOFENAC SODIUM (VOLTAREN) 1 % GEL    Apply 4 g topically 4 (four) times daily as needed.   GLIPIZIDE (GLUCOTROL XL) 5 MG 24 HR TABLET    TAKE 1 TABLET BY MOUTH EVERY DAY WITH BREAKFAST   LEVOTHYROXINE (SYNTHROID, LEVOTHROID) 100 MCG TABLET    Take one tablet by mouth once daily on empty stomach 30 minutes before breakfast.   LOSARTAN (COZAAR) 50 MG TABLET    Take 1 tablet (50 mg total) by mouth daily.   NYSTATIN (MYCOSTATIN/NYSTOP) POWDER    Apply topically 3 (three) times daily.   TRAMADOL (ULTRAM) 50 MG TABLET    TAKE 1 TABLET BY MOUTH THREE TIMES DAILY AS NEEDED FOR PAIN   TRIAMCINOLONE CREAM (KENALOG) 0.1 %    Apply 1 application topically 2 (two) times daily.  Modified Medications   No medications on file  Discontinued Medications   No medications on file    Physical Exam:  There were no vitals filed for this visit. There is no height or weight on file to calculate BMI. Wt Readings from Last 3 Encounters:  01/11/19 252 lb 6.4 oz (114.5 kg)  12/20/18 249 lb (112.9 kg)  12/15/18 249 lb 3.2 oz (113 kg)    = Labs reviewed: Basic Metabolic Panel: Recent Labs    05/21/18 0821 10/04/18 1349 11/26/18 1037  NA  141 141 140  K 5.1 4.6 4.5  CL 109 109 105  CO2 26 29 28   GLUCOSE 100* 118 91  BUN 12 19 14   CREATININE 0.94* 1.17* 0.95*  CALCIUM 9.1 9.4 9.5  TSH 0.94  --  1.54   Liver Function Tests: Recent Labs    05/21/18 0821 10/04/18 1349  AST  --  15  ALT 10 9  BILITOT  --  0.3  PROT  --  6.2   No results for input(s): LIPASE, AMYLASE in the last 8760  hours. No results for input(s): AMMONIA in the last 8760 hours. CBC: Recent Labs    11/26/18 1037 12/15/18 1528  WBC 5.2 9.6  NEUTROABS  --  7,392  HGB 12.3 12.0  HCT 37.0 36.6  MCV 88.5 88.8  PLT 200 198   Lipid Panel: Recent Labs    05/21/18 0821  CHOL 164  HDL 51  LDLCALC 91  TRIG 120  CHOLHDL 3.2   TSH: Recent Labs    05/21/18 0821 11/26/18 1037  TSH 0.94 1.54   A1C: Lab Results  Component Value Date   HGBA1C 6.2 (H) 11/26/2018     Assessment/Plan 1. Acute left-sided back pain with sciatica Pt reports she has had acute worsening of pain for 5 days with inability to control bladder with weakness in leg and altered gait. Recommend that she seek immediate medical attention in the ED due to these symptoms and risk of spinal cord compression. She was home alone therefore Daughter called and notified to bring her to ED. Daughter actually reports she offered to bring her to ED when it started and pt declined.  She agreed and plans to bring her today for evaluation.   Carlos American. Harle Battiest  [facility] [phone]    Virtual Visit via Telephone Note  I connected with@ on 04/21/19 at  3:15 PM EDT by telephone and verified that I am speaking with the correct person using two identifiers.  Location: Patient: home Provider: office   I discussed the limitations, risks, security and privacy concerns of performing an evaluation and management service by telephone and the availability of in person appointments. I also discussed with the patient that there may be a patient responsible  charge related to this service. The patient expressed understanding and agreed to proceed.   I discussed the assessment and treatment plan with the patient. The patient was provided an opportunity to ask questions and all were answered. The patient agreed with the plan and demonstrated an understanding of the instructions.   The patient was advised to call back or seek an in-person evaluation if the symptoms worsen or if the condition fails to improve as anticipated.  I provided  21 minutes of non-face-to-face time during this encounter.  Carlos American. Harle Battiest Avs printed and mailed

## 2019-04-21 NOTE — ED Provider Notes (Signed)
[place] [facility] DEPT Provider Note   CSN: [id number] Arrival date & time: 04/21/19  1632     History   Chief Complaint Chief Complaint  Patient presents with  . Hip Pain    HPI Karla Willis is a 84 y.o. female.  She has a prior history of spinal stenosis and left-sided radicular sciatic pain.  She is complaining of 1 week of pain in her left buttock that goes down to the top of her left foot.  She rates that as severe a few days ago but it was a little bit better today and she is able to use her walker.  She is required assistance to get up out of a chair and has been incontinent of urine a few times, she thinks because she just cannot get to the bathroom fast enough.  She is also had some urinary frequency.  No numbness.  No weakness.  No fevers or chills no cough no abdominal pain.  She is tried tramadol for it without any improvement.  She talked to her PCP today who sent her to the emergency department for evaluation.  She said she had a similar pain a few years ago and required spinal injections.     The history is provided by the patient.  Hip Pain This is a recurrent problem. The current episode started more than 2 days ago. The problem occurs constantly. The problem has been gradually improving. Pertinent negatives include no chest pain, no abdominal pain, no headaches and no shortness of breath. The symptoms are aggravated by walking. Nothing relieves the symptoms. She has tried rest for the symptoms. The treatment provided mild relief.    Past Medical History:  Diagnosis Date  . Allergy   . Arthritis   . 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 Cancer ([place])   . Diabetes mellitus without complication ([place])   . GERD (gastroesophageal reflux disease)    occ  . Gout   . History of hiatal hernia   . HOH (hard of hearing)   . Hypertension   . Hypothyroidism   . Pneumonia    hx  . Thyroid disease   . Wears glasses      Patient Active Problem List   Diagnosis Date Noted  . History of breast cancer 05/20/2017  . Idiopathic gout 05/20/2017  . Hyperlipidemia LDL goal <100 05/20/2017  . Bilateral edema of lower extremity 05/20/2017  . Acute renal failure (ARF) ([place])   . Acute on chronic renal failure ([place]) 05/25/2016  . ARF (acute renal failure) ([place]) 05/25/2016  . Dyspnea on exertion 05/25/2016  . Anemia 05/25/2016  . Localized primary osteoarthritis of right shoulder region 05/21/2016  . Osteoarthritis, multiple sites 03/09/2015  . Hypothyroidism 03/09/2015  . Goiter 03/09/2015  . Essential hypertension, benign 03/09/2015  . Diabetes mellitus, type 2 ([place]) 03/09/2015  . Breast cancer of lower-inner quadrant of right female breast ([place]) 01/02/2014  . DCIS (ductal carcinoma in situ) of breast 11/11/2013    Past Surgical History:  Procedure Laterality Date  . BREAST BIOPSY Right 10/26/2013  . BREAST LUMPECTOMY Right 12/16/2013  . BREAST LUMPECTOMY WITH NEEDLE LOCALIZATION Right 12/16/2013   Procedure: BREAST LUMPECTOMY WITH NEEDLE LOCALIZATION;  Surgeon: Edward Jolly, MD;  Location: [place];  Service: General;  Laterality: Right;  . CATARACT EXTRACTION Left 12/30/2017  . CHOLECYSTECTOMY  1970  . COLONOSCOPY    . FINGER SURGERY     right  hand-  . gallbladder sugery    . RE-EXCISION OF BREAST LUMPECTOMY Right 01/30/2014   Procedure: RE-EXCISION OF BREAST LUMPECTOMY;  Surgeon: Edward Jolly, MD;  Location: [place];  Service: General;  Laterality: Right;  . RE-EXCISION OF BREAST LUMPECTOMY Right 03/06/2014   Procedure: RE-EXCISION OF BREAST LUMPECTOMY;  Surgeon: Edward Jolly, MD;  Location: [place];  Service: General;  Laterality: Right;  . SHOULDER ARTHROSCOPY  2011   left  . THYROID SURGERY  1970  . TOTAL SHOULDER ARTHROPLASTY Right 05/21/2016  . TOTAL SHOULDER ARTHROPLASTY Right 05/21/2016   Procedure: RIGHT TOTAL SHOULDER  ARTHROPLASTY;  Surgeon: Ninetta Lights, MD;  Location: [place];  Service: Orthopedics;  Laterality: Right;  . TUBAL LIGATION       OB History   No obstetric history on file.      Home Medications    Prior to Admission medications   Medication Sig Start Date End Date Taking? Authorizing Provider  allopurinol (ZYLOPRIM) 100 MG tablet TAKE 1 TABLET BY MOUTH EVERY DAY FOR GOUT 03/11/19   Lauree Chandler, NP  amLODipine (NORVASC) 5 MG tablet TAKE 1 TABLET BY MOUTH EVERY DAY 02/01/19   Lauree Chandler, NP  diclofenac sodium (VOLTAREN) 1 % GEL Apply 4 g topically 4 (four) times daily as needed. 11/26/18   Medina-Vargas, Monina C, NP  glipiZIDE (GLUCOTROL XL) 5 MG 24 hr tablet TAKE 1 TABLET BY MOUTH EVERY DAY WITH BREAKFAST 02/04/19   Lauree Chandler, NP  levothyroxine (SYNTHROID, LEVOTHROID) 100 MCG tablet Take one tablet by mouth once daily on empty stomach 30 minutes before breakfast. 01/12/19   Lauree Chandler, NP  losartan (COZAAR) 50 MG tablet Take 1 tablet (50 mg total) by mouth daily. 10/04/18   Lauree Chandler, NP  nystatin (MYCOSTATIN/NYSTOP) powder Apply topically 3 (three) times daily. 11/26/18   Medina-Vargas, Monina C, NP  traMADol (ULTRAM) 50 MG tablet TAKE 1 TABLET BY MOUTH THREE TIMES DAILY AS NEEDED FOR PAIN 04/12/19   Lauree Chandler, NP  triamcinolone cream (KENALOG) 0.1 % Apply 1 application topically 2 (two) times daily. 10/04/18   Lauree Chandler, NP    Family History Family History  Problem Relation Age of Onset  . Diabetes Mother   . Arthritis Father   . Seizures Sister   . Cancer Sister        breast  . Breast cancer Sister   . Multiple sclerosis Son        Deceased     Social History Social History   Tobacco Use  . Smoking status: Never Smoker  . Smokeless tobacco: Never Used  Substance Use Topics  . Alcohol use: No    Alcohol/week: 0.0 standard drinks  . Drug use: No     Allergies   Actos [pioglitazone], Hydrocodone, Codeine, Neurontin  [gabapentin], Other, and Peanut-containing drug products   Review of Systems Review of Systems  Constitutional: Negative for fever.  HENT: Negative for sore throat.   Eyes: Negative for visual disturbance.  Respiratory: Negative for shortness of breath.   Cardiovascular: Negative for chest pain.  Gastrointestinal: Negative for abdominal pain.  Genitourinary: Positive for frequency. Negative for dysuria.  Musculoskeletal: Positive for gait problem.  Skin: Negative for rash.  Neurological: Negative for weakness, numbness and headaches.     Physical Exam Updated Vital Signs BP (!) 155/76 (BP Location: Left Arm)   Pulse 88   Temp 99.1 F (37.3 C) (Oral)  Resp 18   SpO2 98%   Physical Exam Vitals signs and nursing note reviewed.  Constitutional:      General: She is not in acute distress.    Appearance: She is well-developed.  HENT:     Head: Normocephalic and atraumatic.  Eyes:     Conjunctiva/sclera: Conjunctivae normal.  Neck:     Musculoskeletal: Neck supple.  Cardiovascular:     Rate and Rhythm: Normal rate and regular rhythm.     Heart sounds: No murmur.  Pulmonary:     Effort: Pulmonary effort is normal. No respiratory distress.     Breath sounds: Normal breath sounds.  Abdominal:     Palpations: Abdomen is soft.     Tenderness: There is no abdominal tenderness.  Musculoskeletal: Normal range of motion.        General: No deformity or signs of injury.     Comments: She has no real tenderness through her hip internal and external rotation.  She has pain through her left buttock that radiates down onto the top of her left foot.  Her hip flexion strength is slightly reduced although it is unclear if his pain is limiting her.  Her foot plantarflexion and dorsiflexion is intact.  Sensation intact to light touch.  Skin:    General: Skin is warm and dry.     Capillary Refill: Capillary refill takes less than 2 seconds.  Neurological:     Mental Status: She is alert.      Sensory: No sensory deficit.      ED Treatments / Results  Labs (all labs ordered are listed, but only abnormal results are displayed) Labs Reviewed  URINALYSIS, ROUTINE W REFLEX MICROSCOPIC - Abnormal; Notable for the following components:      Result Value   APPearance HAZY (*)    Leukocytes,Ua TRACE (*)    All other components within normal limits  BASIC METABOLIC PANEL - Abnormal; Notable for the following components:   Creatinine, Ser 1.23 (*)    GFR calc non Af Amer 41 (*)    GFR calc Af Amer 48 (*)    All other components within normal limits  CBC WITH DIFFERENTIAL/PLATELET - Abnormal; Notable for the following components:   Monocytes Absolute 1.1 (*)    All other components within normal limits  URINE CULTURE    EKG    Radiology Dg Hip Unilat With Pelvis 2-3 Views Left  Result Date: 04/21/2019 CLINICAL DATA:  Hip pain EXAM: DG HIP (WITH OR WITHOUT PELVIS) 2-3V LEFT COMPARISON:  None. FINDINGS: Right SI joint sclerosis. Pubic symphysis and rami appear intact. No fracture or malalignment. Mild degenerative change of the hips. IMPRESSION: 1. No acute osseous abnormality. 2. Mild degenerative change of the hips Electronically Signed   By: Donavan Foil M.D.   On: 04/21/2019 17:32    Procedures Procedures (including critical care time)  Medications Ordered in ED Medications  oxyCODONE-acetaminophen (PERCOCET/ROXICET) 5-325 MG per tablet 1 tablet (has no administration in time range)     Initial Impression / Assessment and Plan / ED Course  I have reviewed the triage vital signs and the nursing notes.  Pertinent labs & imaging results that were available during my care of the patient were reviewed by me and considered in my medical decision making (see chart for details).  Clinical Course as of Apr 21 748  Thu Apr 21, 2019  1857 Patient with a known history of spinal stenosis and left-sided radicular pain.  She has this  pain again with possibly some urinary  incontinence although it sounds more like pain is limiting her getting to the bathroom.  Unfortunately she has a history of osteoporosis and remote history of cancer so we should proceed with some imaging.  I placed an order to have her get an MRI here but apparently the machine is down until 7 AM tomorrow.  I gave the patient the option of being transferred to [facility] to get an MRI tonight versus me placing an outpatient order for her to likely have it done tomorrow and she would rather go home.  She understands to return if any worsening symptoms.  We will medicate her for some pain and check a urinalysis and some basic labs.   [MB]    Clinical Course User Index [MB] Hayden Rasmussen, MD        Final Clinical Impressions(s) / ED Diagnoses   Final diagnoses:  Sciatica of left side    ED Discharge Orders         Ordered    oxyCODONE-acetaminophen (PERCOCET/ROXICET) 5-325 MG tablet  Every 6 hours PRN    Note to Pharmacy: Unable to do electronically due to technical problem   04/21/19 2010    [place]     04/21/19 2010           Hayden Rasmussen, MD 04/22/19 [phone]

## 2019-04-21 NOTE — ED Triage Notes (Signed)
Patient states she is having trouble with left hip.   Patient states it started Saturday night and pain got so bad last night patient was unable to move and had to have help getting out of chair.   6/10 dull pain  Patient points too back of hip going down thigh.   Patient states last night she was unable to control her bladder and urinated on herself but states it is "getting better".   Patient ambulated with walker in ED.   Patient called PCP and was told to come to ED since she was having trouble controlling her bladder.     A/Ox4

## 2019-04-21 NOTE — Discharge Instructions (Addendum)
You are seen in the emergency department for left buttock pain radiating down your leg.  This is likely sciatica.  You had a urinalysis today that did not show any sign of infection.  We tried to get you an MRI tonight but the scanner was down.  MRI should contact you tomorrow to arrange for a test.  Please follow-up with your doctor and return if any worsening symptoms.

## 2019-04-21 NOTE — Telephone Encounter (Signed)
Called daughter and updated her, she has been advised to go to the ED due to severity in her symptoms

## 2019-04-23 LAB — URINE CULTURE: Culture: <10000 — AB

## 2019-05-11 ENCOUNTER — Other Ambulatory Visit: Payer: Self-pay | Admitting: Nurse Practitioner

## 2019-05-11 ENCOUNTER — Telehealth: Payer: Self-pay | Admitting: *Deleted

## 2019-05-11 ENCOUNTER — Encounter: Payer: Self-pay | Admitting: Family

## 2019-05-11 ENCOUNTER — Ambulatory Visit (INDEPENDENT_AMBULATORY_CARE_PROVIDER_SITE_OTHER): Payer: Medicare Other | Admitting: Family

## 2019-05-11 ENCOUNTER — Other Ambulatory Visit: Payer: Self-pay

## 2019-05-11 DIAGNOSIS — E119 Type 2 diabetes mellitus without complications: Secondary | ICD-10-CM

## 2019-05-11 DIAGNOSIS — R05 Cough: Secondary | ICD-10-CM | POA: Diagnosis not present

## 2019-05-11 DIAGNOSIS — R059 Cough, unspecified: Secondary | ICD-10-CM

## 2019-05-11 DIAGNOSIS — Z20828 Contact with and (suspected) exposure to other viral communicable diseases: Secondary | ICD-10-CM

## 2019-05-11 DIAGNOSIS — Z20822 Contact with and (suspected) exposure to covid-19: Secondary | ICD-10-CM

## 2019-05-11 MED ORDER — ZINC GLUCONATE 50 MG PO TABS: 50.0000 mg | ORAL_TABLET | Freq: Every day | ORAL | 0 refills | Status: AC

## 2019-05-11 MED ORDER — VITAMIN C 500 MG PO TABS: 500.0000 mg | ORAL_TABLET | Freq: Three times a day (TID) | ORAL | 0 refills | Status: AC

## 2019-05-11 NOTE — Telephone Encounter (Signed)
Pt scheduled for covid testing 05/12/19 @ 11:15 @ [facility]. Instructions given and order placed

## 2019-05-11 NOTE — Patient Instructions (Addendum)
1. Take vitamin C 500 mg tablet one by mouth three times daily x 14 days   2. Zinc 50 mg tablet one by mouth daily x 14 days  3. Increase Fluid intake/soup daily   4. Please go to ED if  You have worsening cough or shortness of breath.   5. Get plenty of rest,sit out on the pouch for fresh air and sunshine daily   6.Take OTC Mucinex every 6  Hours as needed for cough  7. Please go to drive through [id number] testing center for COVID-19 screening due to exposure to your daughter.   8.wear mask,Quarantine,social distancing ,Hand and surfaces hygiene per CDC guidelines discussed.  This information is directly available on the CDC website: RunningShows.co.za.html    Source:CDC Reference to specific commercial products, manufacturers, companies, or trademarks does not constitute its endorsement or recommendation by the [place], [place], or Centers for [employer] and Prevention.

## 2019-05-11 NOTE — Telephone Encounter (Signed)
-----   Message from Sandrea Hughs, NP sent at 05/11/2019 11:25 AM EDT ----- Regarding: possible COVID-19 exposure Patient has cough x 1 & 1/2 weeks,poor appetite,loss of taste and tiredness.she lives with her daughter who is currently on Quarantine(symptomatic) for possible exposure to COVID-19.Daughter has been tested but still waiting for results.Daughter drives buses for [employer] [place]. I recommend testing patient for COVID-19. Thank You  Dinah Ngetich FNP-C

## 2019-05-11 NOTE — Progress Notes (Signed)
Patient ID: Karla Willis, female   DOB: 1935/05/12, 84 y.o.   MRN: [id number] This service is provided via telemedicine  No vital signs collected/recorded due to the encounter was a telemedicine visit.   Location of patient (ex: home, work):  HOME  Patient consents to a telephone visit:  YES  Location of the provider (ex: office, home):  OFFICE  Name of any referring provider:  Sherrie Mustache, NP  Names of all persons participating in the telemedicine service and their role in the encounter:  PATIENT, Edwin Dada, [place], Marlowe Sax, NP  Time spent on call:  4:02  Provider: Adolph Clutter FNP-C  Lauree Chandler, NP  Patient Care Team: Lauree Chandler, NP as PCP - General (Geriatric Medicine) Nicholas Lose, MD as Consulting Physician (Hematology and Oncology) Specialists, [place] as Consulting Physician (Orthopedic Surgery)  Extended Emergency Contact Information Primary Emergency Contact: Keondra, [place], [place] [address] [name] of [place] Phone: [phone] Relation: Daughter Secondary Emergency Contact: [name] Address: [address]          [place], [place] [address] [place] of [place] Phone: [phone] Relation: Daughter  Goals of care: Advanced Directive information Advanced Directives 04/21/2019  Does Patient Have a Medical Advance Directive? Yes  Type of Advance Directive Living will;Healthcare Power of Attorney  Does patient want to make changes to medical advance directive? No - Patient declined  Copy of [place] in Chart? No - copy requested  Pre-existing out of facility DNR order (yellow form or pink MOST form) -     Chief Complaint  Patient presents with  . Acute Visit    cough x1 week    HPI:  Pt is a 84 y.o. female seen today for an acute visit for evaluation of cough.she states has had cough for about one and a half weeks.cough is non-productive.she states had  a fever one week ago but none since then.she cannot get rid of the cough.she has mucinex at home but has not taken it.she has had generalized weakness,poor appetite and cannot taste the food.she states lives with her daughter who is currently off work due to possible COVID-19 exposure.Daughter usually drives [employer] [place] [employer].Daughter has been tested for COVID-19 but still awaiting results.    Past Medical History:  Diagnosis Date  . Allergy   . Arthritis   . 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 Cancer ([facility])   . Diabetes mellitus without complication ([facility])   . GERD (gastroesophageal reflux disease)    occ  . Gout   . History of hiatal hernia   . HOH (hard of hearing)   . Hypertension   . Hypothyroidism   . Pneumonia    hx  . Thyroid disease   . Wears glasses    Past Surgical History:  Procedure Laterality Date  . BREAST BIOPSY Right 10/26/2013  . BREAST LUMPECTOMY Right 12/16/2013  . BREAST LUMPECTOMY WITH NEEDLE LOCALIZATION Right 12/16/2013   Procedure: BREAST LUMPECTOMY WITH NEEDLE LOCALIZATION;  Surgeon: Edward Jolly, MD;  Location: [facility];  Service: General;  Laterality: Right;  . CATARACT EXTRACTION Left 12/30/2017  . CHOLECYSTECTOMY  1970  . COLONOSCOPY    . FINGER SURGERY     right hand-  . gallbladder sugery    . RE-EXCISION OF BREAST LUMPECTOMY Right 01/30/2014   Procedure: RE-EXCISION OF BREAST LUMPECTOMY;  Surgeon: Edward Jolly, MD;  Location: [facility];  Service: General;  Laterality: Right;  . RE-EXCISION OF BREAST LUMPECTOMY Right 03/06/2014   Procedure: RE-EXCISION OF BREAST LUMPECTOMY;  Surgeon: Edward Jolly, MD;  Location: [facility];  Service: General;  Laterality: Right;  . SHOULDER ARTHROSCOPY  2011   left  . THYROID SURGERY  1970  . TOTAL SHOULDER ARTHROPLASTY Right 05/21/2016  . TOTAL SHOULDER ARTHROPLASTY Right 05/21/2016   Procedure: RIGHT TOTAL SHOULDER  ARTHROPLASTY;  Surgeon: Ninetta Lights, MD;  Location: [facility];  Service: Orthopedics;  Laterality: Right;  . TUBAL LIGATION      Allergies  Allergen Reactions  . Actos [Pioglitazone] Swelling  . Hydrocodone Nausea Only and Other (See Comments)    "bloated"  . Codeine Rash  . Neurontin [Gabapentin] Palpitations and Other (See Comments)    dizziness  . Other Rash    Nuts--PECANS  . Peanut-Containing Drug Products Rash    Outpatient Encounter Medications as of 05/11/2019  Medication Sig  . allopurinol (ZYLOPRIM) 100 MG tablet TAKE 1 TABLET BY MOUTH EVERY DAY FOR GOUT  . amLODipine (NORVASC) 5 MG tablet TAKE 1 TABLET BY MOUTH EVERY DAY  . diclofenac sodium (VOLTAREN) 1 % GEL Apply 4 g topically 4 (four) times daily as needed.  Marland Kitchen glipiZIDE (GLUCOTROL XL) 5 MG 24 hr tablet TAKE 1 TABLET BY MOUTH EVERY DAY WITH BREAKFAST  . levothyroxine (SYNTHROID, LEVOTHROID) 100 MCG tablet Take one tablet by mouth once daily on empty stomach 30 minutes before breakfast.  . losartan (COZAAR) 50 MG tablet Take 1 tablet (50 mg total) by mouth daily.  Marland Kitchen nystatin (MYCOSTATIN/NYSTOP) powder Apply topically 3 (three) times daily.  Marland Kitchen oxyCODONE-acetaminophen (PERCOCET/ROXICET) 5-325 MG tablet Take 1 tablet by mouth every 6 (six) hours as needed for severe pain.  . traMADol (ULTRAM) 50 MG tablet TAKE 1 TABLET BY MOUTH THREE TIMES DAILY AS NEEDED FOR PAIN  . triamcinolone cream (KENALOG) 0.1 % Apply 1 application topically 2 (two) times daily.   No facility-administered encounter medications on file as of 05/11/2019.     Review of Systems  Constitutional: Positive for appetite change and fatigue. Negative for chills.       Had Fever a week ago.   HENT: Negative for congestion, rhinorrhea, sinus pressure, sinus pain, sneezing and sore throat.        Can't taste any food   Eyes: Negative for pain, discharge, redness and itching.  Respiratory: Positive for cough. Negative for chest tightness, shortness of breath and  wheezing.   Cardiovascular: Negative for chest pain, palpitations and leg swelling.  Gastrointestinal: Negative for abdominal distention, abdominal pain, diarrhea, nausea and vomiting.  Genitourinary: Negative for difficulty urinating, dysuria, flank pain, frequency and urgency.  Skin: Negative for color change, pallor and rash.  Neurological: Positive for headaches. Negative for dizziness, weakness, light-headedness and numbness.       Generalized weakness   Psychiatric/Behavioral: Negative for agitation and sleep disturbance. The patient is not nervous/anxious.     Immunization History  Administered Date(s) Administered  . Influenza,inj,Quad PF,6+ Mos 09/07/2015, 07/25/2016, 09/22/2017  . Influenza-Unspecified 08/10/2014, 10/04/2018  . Pneumococcal Conjugate-13 01/14/2017  . Pneumococcal-Unspecified 11/10/2013  . Tdap 04/18/2016  . Zoster 04/18/2012   Pertinent  Health Maintenance Due  Topic Date Due  . FOOT EXAM  05/26/2019  . HEMOGLOBIN A1C  05/27/2019  . INFLUENZA VACCINE  06/11/2019  . OPHTHALMOLOGY EXAM  08/11/2019  . DEXA SCAN  Completed  .  PNA vac Low Risk Adult  Completed   Fall Risk  05/11/2019 04/21/2019 01/11/2019 12/15/2018 10/04/2018  Falls in the past year? 0 0 0 0 0  Number falls in past yr: 0 0 0 0 -  Injury with Fall? 0 0 0 0 -   Functional Status Survey:    There were no vitals filed for this visit. There is no height or weight on file to calculate BMI. Physical Exam  unable to complete on Telephone visit.  Labs reviewed: Recent Labs    10/04/18 1349 11/26/18 1037 04/21/19 1852  NA 141 140 140  K 4.6 4.5 3.7  CL 109 105 106  CO2 29 28 25   GLUCOSE 118 91 89  BUN 19 14 17   CREATININE 1.17* 0.95* 1.23*  CALCIUM 9.4 9.5 9.0   Recent Labs    05/21/18 0821 10/04/18 1349  AST  --  15  ALT 10 9  BILITOT  --  0.3  PROT  --  6.2   Recent Labs    11/26/18 1037 12/15/18 1528 04/21/19 1852  WBC 5.2 9.6 8.2  NEUTROABS  --  7,392 5.1  HGB 12.3  12.0 12.6  HCT 37.0 36.6 38.0  MCV 88.5 88.8 87.6  PLT 200 198 219   Lab Results  Component Value Date   TSH 1.54 11/26/2018   Lab Results  Component Value Date   HGBA1C 6.2 (H) 11/26/2018   Lab Results  Component Value Date   CHOL 164 05/21/2018   HDL 51 05/21/2018   LDLCALC 91 05/21/2018   TRIG 120 05/21/2018   CHOLHDL 3.2 05/21/2018    Significant Diagnostic Results in last 30 days:  Dg Hip Unilat With Pelvis 2-3 Views Left  Result Date: 04/21/2019 CLINICAL DATA:  Hip pain EXAM: DG HIP (WITH OR WITHOUT PELVIS) 2-3V LEFT COMPARISON:  None. FINDINGS: Right SI joint sclerosis. Pubic symphysis and rami appear intact. No fracture or malalignment. Mild degenerative change of the hips. IMPRESSION: 1. No acute osseous abnormality. 2. Mild degenerative change of the hips Electronically Signed   By: Donavan Foil M.D.   On: 04/21/2019 17:32    Assessment/Plan 1. Close Exposure to Covid-19 Virus Afebrile.Reports loss of taste,poor appetite,cough for one and a half weeks but no shortness of breath.Possible exposure to COVID-19 from her daughter who is currently awaiting COVID-19 results.Daughter drive [place] [employer]. - Take vitamin C 500 mg tablet one by mouth three times daily x 14 days  -  Zinc 50 mg tablet one by mouth daily x 14 days  -  Increase Fluid intake/soup daily -   Get plenty of rest,sit out on the pouch for fresh air and sunshine daily  -  Please go to ED if  You have worsening cough or shortness of breath.  -  Please go to drive through [id number] testing center for COVID-19 screening due to exposure to your daughter. - wear mask,Quarantine,social distancing ,Hand and surfaces hygiene per CDC guidelines discussed.  2. Cough Take OTC Mucinex every 6  Hours as needed for cough.Go to ED or call 9-1-1 if symptoms worsen or you have any shortness of breath.   Family/ staff Communication: Reviewed plan of care with patient.   Labs/tests ordered: Referred for COVID-19  testing at [facility].   Time spent with patient 11 minutes >50% time spent counseling; reviewing medical record; tests; labs; and developing future plan of care    Sandrea Hughs, NP

## 2019-05-12 ENCOUNTER — Other Ambulatory Visit: Payer: Self-pay

## 2019-05-12 DIAGNOSIS — Z20822 Contact with and (suspected) exposure to covid-19: Secondary | ICD-10-CM

## 2019-05-12 DIAGNOSIS — R6889 Other general symptoms and signs: Secondary | ICD-10-CM | POA: Diagnosis not present

## 2019-05-17 ENCOUNTER — Telehealth: Payer: Self-pay

## 2019-05-17 NOTE — Telephone Encounter (Signed)
Awesome.will wait for the results.

## 2019-05-17 NOTE — Telephone Encounter (Signed)
Dinah Ngetich NP requested to reach out to patient regarding scheduled COVID Testing. Spoke with patient and she stated she had been tested last Thursday July 2 at 11:15 am. Patient stated that she is still awaiting results. Provider was informed of this information.

## 2019-05-18 LAB — NOVEL CORONAVIRUS, NAA: SARS-CoV-2, NAA: NOT DETECTED

## 2019-05-30 ENCOUNTER — Ambulatory Visit (INDEPENDENT_AMBULATORY_CARE_PROVIDER_SITE_OTHER): Payer: Medicare Other | Admitting: Nurse Practitioner

## 2019-05-30 ENCOUNTER — Other Ambulatory Visit: Payer: Self-pay

## 2019-05-30 ENCOUNTER — Encounter: Payer: Self-pay | Admitting: Nurse Practitioner

## 2019-05-30 DIAGNOSIS — R05 Cough: Secondary | ICD-10-CM | POA: Diagnosis not present

## 2019-05-30 DIAGNOSIS — R058 Other specified cough: Secondary | ICD-10-CM

## 2019-05-30 NOTE — Progress Notes (Signed)
This service is provided via telemedicine  No vital signs collected/recorded due to the encounter was a telemedicine visit.   Location of patient (ex: home, work): Home  Patient consents to a telephone visit:  Yes   Location of the provider (ex: office, home):  [facility], Office   Name of any referring provider:  N/A  Names of all persons participating in the telemedicine service and their role in the encounter:  S.Chrae B/CMA, Sherrie Mustache, NP, and Patient   Time spent on call: 6 min with medical assistant      Careteam: Patient Care Team: Lauree Chandler, NP as PCP - General (Geriatric Medicine) Nicholas Lose, MD as Consulting Physician (Hematology and Oncology) Specialists, [place] as Consulting Physician (Orthopedic Surgery)  Advanced Directive information    Allergies  Allergen Reactions  . Actos [Pioglitazone] Swelling  . Hydrocodone Nausea Only and Other (See Comments)    "bloated"  . Codeine Rash  . Neurontin [Gabapentin] Palpitations and Other (See Comments)    dizziness  . Other Rash    Nuts--PECANS  . Peanut-Containing Drug Products Rash    Chief Complaint  Patient presents with  . Acute Visit    Cough and no energy      HPI: Patient is a 84 y.o. female due to persistent cough after illness. Reports she was very sick at the end of June and was seen in office on 05/11/2019. She was sent for COVID testing which was negative. Reports she is overall feeling better but overall still does not have energy and with ongoing cough.  Went to the store to get a few things and when she got home she was "give out" felt like she had been running all day.  It was noted that she had fever, weakness, fatigue, lack of taste and smell and cough for about a week.  She had been taking mucinex DM which was effective but now out. She has a dry cough.  No fever, shortness of breath, chest pains.   Review of Systems:  Review of Systems   Constitutional: Positive for malaise/fatigue. Negative for chills, fever and weight loss.  HENT: Positive for sore throat (mild). Negative for congestion and sinus pain.   Respiratory: Positive for cough. Negative for sputum production, shortness of breath and wheezing.   Cardiovascular: Negative for chest pain.    Past Medical History:  Diagnosis Date  . Allergy   . Arthritis   . Breast cancer ([facility]) 10/24/13   right upper inner- DCIS  . Cancer ([facility])   . Diabetes mellitus without complication ([facility])   . GERD (gastroesophageal reflux disease)    occ  . Gout   . History of hiatal hernia   . HOH (hard of hearing)   . Hypertension   . Hypothyroidism   . Pneumonia    hx  . Thyroid disease   . Wears glasses    Past Surgical History:  Procedure Laterality Date  . BREAST BIOPSY Right 10/26/2013  . BREAST LUMPECTOMY Right 12/16/2013  . BREAST LUMPECTOMY WITH NEEDLE LOCALIZATION Right 12/16/2013   Procedure: BREAST LUMPECTOMY WITH NEEDLE LOCALIZATION;  Surgeon: Edward Jolly, MD;  Location: [facility];  Service: General;  Laterality: Right;  . CATARACT EXTRACTION Left 12/30/2017  . CHOLECYSTECTOMY  1970  . COLONOSCOPY    . FINGER SURGERY     right hand-  . gallbladder sugery    . RE-EXCISION OF BREAST LUMPECTOMY Right 01/30/2014   Procedure:  RE-EXCISION OF BREAST LUMPECTOMY;  Surgeon: Edward Jolly, MD;  Location: [facility];  Service: General;  Laterality: Right;  . RE-EXCISION OF BREAST LUMPECTOMY Right 03/06/2014   Procedure: RE-EXCISION OF BREAST LUMPECTOMY;  Surgeon: Edward Jolly, MD;  Location: [facility];  Service: General;  Laterality: Right;  . SHOULDER ARTHROSCOPY  2011   left  . THYROID SURGERY  1970  . TOTAL SHOULDER ARTHROPLASTY Right 05/21/2016  . TOTAL SHOULDER ARTHROPLASTY Right 05/21/2016   Procedure: RIGHT TOTAL SHOULDER ARTHROPLASTY;  Surgeon: Ninetta Lights, MD;  Location: [facility];  Service:  Orthopedics;  Laterality: Right;  . TUBAL LIGATION     Social History:   reports that she has never smoked. She has never used smokeless tobacco. She reports that she does not drink alcohol or use drugs.  Family History  Problem Relation Age of Onset  . Diabetes Mother   . Arthritis Father   . Seizures Sister   . Pneumonia Daughter   . Cancer Sister        breast  . Breast cancer Sister   . Multiple sclerosis Son        Deceased     Medications: Patient's Medications  New Prescriptions   No medications on file  Previous Medications   ALLOPURINOL (ZYLOPRIM) 100 MG TABLET    TAKE 1 TABLET BY MOUTH EVERY DAY FOR GOUT   AMLODIPINE (NORVASC) 5 MG TABLET    TAKE 1 TABLET BY MOUTH EVERY DAY   DICLOFENAC SODIUM (VOLTAREN) 1 % GEL    Apply 4 g topically 4 (four) times daily as needed.   GLIPIZIDE (GLUCOTROL XL) 5 MG 24 HR TABLET    TAKE 1 TABLET BY MOUTH EVERY DAY WITH BREAKFAST   LEVOTHYROXINE (SYNTHROID, LEVOTHROID) 100 MCG TABLET    Take one tablet by mouth once daily on empty stomach 30 minutes before breakfast.   LOSARTAN (COZAAR) 50 MG TABLET    Take 1 tablet (50 mg total) by mouth daily.   NYSTATIN (MYCOSTATIN/NYSTOP) POWDER    Apply topically 3 (three) times daily.   OXYCODONE-ACETAMINOPHEN (PERCOCET/ROXICET) 5-325 MG TABLET    Take 1 tablet by mouth every 6 (six) hours as needed for severe pain.   TRAMADOL (ULTRAM) 50 MG TABLET    TAKE 1 TABLET BY MOUTH THREE TIMES DAILY AS NEEDED FOR PAIN   TRIAMCINOLONE CREAM (KENALOG) 0.1 %    Apply 1 application topically 2 (two) times daily.  Modified Medications   No medications on file  Discontinued Medications   No medications on file    Physical Exam:  There were no vitals filed for this visit. There is no height or weight on file to calculate BMI. Wt Readings from Last 3 Encounters:  01/11/19 252 lb 6.4 oz (114.5 kg)  12/20/18 249 lb (112.9 kg)  12/15/18 249 lb 3.2 oz (113 kg)     Labs reviewed: Basic Metabolic Panel:  Recent Labs    10/04/18 1349 11/26/18 1037 04/21/19 1852  NA 141 140 140  K 4.6 4.5 3.7  CL 109 105 106  CO2 29 28 25   GLUCOSE 118 91 89  BUN 19 14 17   CREATININE 1.17* 0.95* 1.23*  CALCIUM 9.4 9.5 9.0  TSH  --  1.54  --    Liver Function Tests: Recent Labs    10/04/18 1349  AST 15  ALT 9  BILITOT 0.3  PROT 6.2   No results for input(s): LIPASE, AMYLASE in the last 8760  hours. No results for input(s): AMMONIA in the last 8760 hours. CBC: Recent Labs    11/26/18 1037 12/15/18 1528 04/21/19 1852  WBC 5.2 9.6 8.2  NEUTROABS  --  7,392 5.1  HGB 12.3 12.0 12.6  HCT 37.0 36.6 38.0  MCV 88.5 88.8 87.6  PLT 200 198 219   Lipid Panel: No results for input(s): CHOL, HDL, LDLCALC, TRIG, CHOLHDL, LDLDIRECT in the last 8760 hours. TSH: Recent Labs    11/26/18 1037  TSH 1.54   A1C: Lab Results  Component Value Date   HGBA1C 6.2 (H) 11/26/2018     Assessment/Plan 1. Post-viral cough syndrome Has symptoms similar to COVID but tested positive. Still with cough and fatigue but slowly improving. Encouraged to continue supportive care. Increase fluids. Can use mucinex DM by mouth twice daily  Cough drop as needed.   Strict follow up precautions given for worsening of symptoms.  Carlos American. Harle Battiest  [facility] [phone]    Virtual Visit via Telephone Note  I connected with pt on 05/30/19 at  3:45 PM EDT by telephone and verified that I am speaking with the correct person using two identifiers.  Location: Patient: home Provider: office   I discussed the limitations, risks, security and privacy concerns of performing an evaluation and management service by telephone and the availability of in person appointments. I also discussed with the patient that there may be a patient responsible charge related to this service. The patient expressed understanding and agreed to proceed.   I discussed the assessment and treatment plan with  the patient. The patient was provided an opportunity to ask questions and all were answered. The patient agreed with the plan and demonstrated an understanding of the instructions.   The patient was advised to call back or seek an in-person evaluation if the symptoms worsen or if the condition fails to improve as anticipated.  I provided 12 minutes of non-face-to-face time during this encounter.  Carlos American. Harle Battiest Avs printed and mailed

## 2019-05-30 NOTE — Patient Instructions (Addendum)
Increase fluids Warm water with honey and lemon mucinex DM by mouth twice daily with full glass of water for cough and this will help any congestion.  Cough drops as needed Can use tylenol as needed for sore throat or body aches.   Notify us if fever occurs or symptoms worsen.

## 2019-06-22 ENCOUNTER — Other Ambulatory Visit: Payer: Self-pay | Admitting: Nurse Practitioner

## 2019-06-22 ENCOUNTER — Other Ambulatory Visit: Payer: Self-pay | Admitting: Hematology and Oncology

## 2019-06-22 DIAGNOSIS — E119 Type 2 diabetes mellitus without complications: Secondary | ICD-10-CM

## 2019-06-28 ENCOUNTER — Other Ambulatory Visit: Payer: Self-pay | Admitting: Hematology and Oncology

## 2019-07-11 ENCOUNTER — Other Ambulatory Visit: Payer: Self-pay | Admitting: Nurse Practitioner

## 2019-07-11 NOTE — Telephone Encounter (Signed)
NEEDS OV for additional refills

## 2019-07-11 NOTE — Telephone Encounter (Signed)
Last filled in Epic 04/12/2019, RX request sent to Lauree Chandler, NP for review of [facility] Database and approve if necessary

## 2019-08-05 ENCOUNTER — Other Ambulatory Visit: Payer: Self-pay | Admitting: Nurse Practitioner

## 2019-09-08 ENCOUNTER — Other Ambulatory Visit: Payer: Self-pay | Admitting: Nurse Practitioner

## 2019-09-08 NOTE — Telephone Encounter (Signed)
Last filled on 08/05/2019 in [facility]. RX request sent to .Lauree Chandler, NP to review and approve if necessary

## 2019-09-20 ENCOUNTER — Other Ambulatory Visit: Payer: Self-pay | Admitting: Nurse Practitioner

## 2019-09-20 DIAGNOSIS — I10 Essential (primary) hypertension: Secondary | ICD-10-CM

## 2019-09-28 LAB — HM DIABETES EYE EXAM

## 2019-10-08 ENCOUNTER — Other Ambulatory Visit: Payer: Self-pay | Admitting: Nurse Practitioner

## 2019-10-10 NOTE — Telephone Encounter (Signed)
RX last filled in Epic on 09/08/2019  Pending appointment schedule to update contract

## 2019-10-11 ENCOUNTER — Encounter: Payer: Self-pay | Admitting: Nurse Practitioner

## 2019-10-20 ENCOUNTER — Other Ambulatory Visit: Payer: Self-pay | Admitting: Nurse Practitioner

## 2019-10-20 DIAGNOSIS — I10 Essential (primary) hypertension: Secondary | ICD-10-CM

## 2019-10-24 ENCOUNTER — Ambulatory Visit: Payer: Self-pay | Admitting: Nurse Practitioner

## 2019-10-26 ENCOUNTER — Ambulatory Visit: Payer: Self-pay | Admitting: Nurse Practitioner

## 2019-11-02 ENCOUNTER — Ambulatory Visit (INDEPENDENT_AMBULATORY_CARE_PROVIDER_SITE_OTHER): Payer: Medicare Other | Admitting: Nurse Practitioner

## 2019-11-02 ENCOUNTER — Other Ambulatory Visit: Payer: Self-pay

## 2019-11-02 ENCOUNTER — Encounter: Payer: Self-pay | Admitting: Nurse Practitioner

## 2019-11-02 VITALS — BP 128/74 | HR 72 | Temp 96.9°F | Ht 62.0 in | Wt 258.0 lb

## 2019-11-02 DIAGNOSIS — D508 Other iron deficiency anemias: Secondary | ICD-10-CM

## 2019-11-02 DIAGNOSIS — E785 Hyperlipidemia, unspecified: Secondary | ICD-10-CM

## 2019-11-02 DIAGNOSIS — E1122 Type 2 diabetes mellitus with diabetic chronic kidney disease: Secondary | ICD-10-CM

## 2019-11-02 DIAGNOSIS — N182 Chronic kidney disease, stage 2 (mild): Secondary | ICD-10-CM

## 2019-11-02 DIAGNOSIS — Z853 Personal history of malignant neoplasm of breast: Secondary | ICD-10-CM

## 2019-11-02 DIAGNOSIS — R319 Hematuria, unspecified: Secondary | ICD-10-CM

## 2019-11-02 DIAGNOSIS — E034 Atrophy of thyroid (acquired): Secondary | ICD-10-CM

## 2019-11-02 DIAGNOSIS — M1000 Idiopathic gout, unspecified site: Secondary | ICD-10-CM | POA: Diagnosis not present

## 2019-11-02 DIAGNOSIS — M170 Bilateral primary osteoarthritis of knee: Secondary | ICD-10-CM

## 2019-11-02 DIAGNOSIS — Z6841 Body Mass Index (BMI) 40.0 and over, adult: Secondary | ICD-10-CM

## 2019-11-02 DIAGNOSIS — I10 Essential (primary) hypertension: Secondary | ICD-10-CM

## 2019-11-02 LAB — POCT URINALYSIS DIPSTICK
Bilirubin, UA: NEGATIVE
Blood, UA: NEGATIVE
Glucose, UA: NEGATIVE
Ketones, UA: NEGATIVE
Leukocytes, UA: NEGATIVE
Nitrite, UA: NEGATIVE
Protein, UA: NEGATIVE
Spec Grav, UA: 1.01 (ref 1.010–1.025)
Urobilinogen, UA: 0.2 E.U./dL
pH, UA: 5 (ref 5.0–8.0)

## 2019-11-02 NOTE — Progress Notes (Signed)
Careteam: Patient Care Team: Lauree Chandler, NP as PCP - General (Geriatric Medicine) Nicholas Lose, MD as Consulting Physician (Hematology and Oncology) [facility] [place] as Consulting Physician (Orthopedic Surgery)  Advanced Directive information    Allergies  Allergen Reactions  . Actos [Pioglitazone] Swelling  . Hydrocodone Nausea Only and Other (See Comments)    "bloated"  . Codeine Rash  . Neurontin [Gabapentin] Palpitations and Other (See Comments)    dizziness  . Other Rash    Nuts--PECANS  . Peanut-Containing Drug Products Rash    Chief Complaint  Patient presents with  . Medication Management    Review medications and updated opioid treatment agreement   . Vaginal Bleeding    Patient c/o vaginal bleeding x 2 days  . Knee Problem    Bilateral knee pain, left is worse   . Medication Refill    Renew Nystatin Powder   . Other    Balance off      HPI: Patient is a 84 y.o. female seen in the office today for routine follow up  Reports she has hx of cyst in vaginal area. Noted to have blood in urine and thought something had opened up. Not having pain or discomfort but generally does not when this occurs. No blood in the toilet but blood on tissue when she wipes.  No pain with urination, no increase in frequency or urgency.  Pt also reports hx of post menapausal bleeding and had to have a D&C  Dm- does not check blood sugars at home. Needs another need to prick her finger. No hypoglycemic episodes. Last a1c 6.2 in January 2020  Hypertension- blood pressure controlled on norvasc 5 mg daily, losartan 50 mg daily   Spinal stenosis- chronic- but not causing her any issues at this time. Went to the ED in June. This has resolved.  OA- knee pain- worse. Left is worse than right. Currently on tramadol for this. She has been on this for years. Feels like when she started this it was for a different reason but takes now for knee but but does  not feel like it helps that much. voltaren gel helps.   Gout- no recent flares, takes allopurinol daily.   Hx of breast cancer- completed 5 years of therapy with the tamocifen. Continues to follow up with oncologist yearly  Review of Systems:  Review of Systems  Constitutional: Negative for chills, fever and weight loss.  HENT: Negative for tinnitus.   Respiratory: Negative for cough, sputum production and shortness of breath.   Cardiovascular: Negative for chest pain, palpitations and leg swelling.  Gastrointestinal: Negative for abdominal pain, constipation, diarrhea and heartburn.  Genitourinary: Positive for hematuria. Negative for dysuria, frequency and urgency.  Musculoskeletal: Positive for joint pain. Negative for back pain, falls and myalgias.  Skin: Negative.   Neurological: Negative for dizziness, weakness and headaches.  Psychiatric/Behavioral: Negative for depression and memory loss. The patient has insomnia.     Past Medical History:  Diagnosis Date  . Allergy   . Arthritis   . 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  . Cancer ([place])   . Diabetes mellitus without complication ([place])   . GERD (gastroesophageal reflux disease)    occ  . Gout   . History of hiatal hernia   . HOH (hard of hearing)   . Hypertension   . Hypothyroidism   . Pneumonia    hx  . Thyroid disease   . Wears  glasses    Past Surgical History:  Procedure Laterality Date  . BREAST BIOPSY Right 10/26/2013  . BREAST LUMPECTOMY Right 12/16/2013  . BREAST LUMPECTOMY WITH NEEDLE LOCALIZATION Right 12/16/2013   Procedure: BREAST LUMPECTOMY WITH NEEDLE LOCALIZATION;  Surgeon: Edward Jolly, MD;  Location: [facility];  Service: General;  Laterality: Right;  . CATARACT EXTRACTION Left 12/30/2017  . CHOLECYSTECTOMY  1970  . COLONOSCOPY    . FINGER SURGERY     right hand-  . gallbladder sugery    . RE-EXCISION OF BREAST LUMPECTOMY Right 01/30/2014   Procedure:  RE-EXCISION OF BREAST LUMPECTOMY;  Surgeon: Edward Jolly, MD;  Location: [facility];  Service: General;  Laterality: Right;  . RE-EXCISION OF BREAST LUMPECTOMY Right 03/06/2014   Procedure: RE-EXCISION OF BREAST LUMPECTOMY;  Surgeon: Edward Jolly, MD;  Location: [facility];  Service: General;  Laterality: Right;  . SHOULDER ARTHROSCOPY  2011   left  . THYROID SURGERY  1970  . TOTAL SHOULDER ARTHROPLASTY Right 05/21/2016  . TOTAL SHOULDER ARTHROPLASTY Right 05/21/2016   Procedure: RIGHT TOTAL SHOULDER ARTHROPLASTY;  Surgeon: Ninetta Lights, MD;  Location: [facility];  Service: Orthopedics;  Laterality: Right;  . TUBAL LIGATION     Social History:   reports that she has never smoked. She has never used smokeless tobacco. She reports that she does not drink alcohol or use drugs.  Family History  Problem Relation Age of Onset  . Diabetes Mother   . Arthritis Father   . Seizures Sister   . Pneumonia Daughter   . Cancer Sister        breast  . Breast cancer Sister   . Multiple sclerosis Son        Deceased     Medications: Patient's Medications  New Prescriptions   No medications on file  Previous Medications   ACETAMINOPHEN (TYLENOL) 500 MG TABLET    Take 500 mg by mouth as needed.   ALLOPURINOL (ZYLOPRIM) 100 MG TABLET    TAKE 1 TABLET BY MOUTH EVERY DAY FOR GOUT   AMLODIPINE (NORVASC) 5 MG TABLET    TAKE 1 TABLET BY MOUTH EVERY DAY   DICLOFENAC SODIUM (VOLTAREN) 1 % GEL    Apply 4 g topically 4 (four) times daily as needed.   GLIPIZIDE (GLIPIZIDE XL) 5 MG 24 HR TABLET    TAKE 1 TABLET BY MOUTH EVERY DAY WITH BREAKFAST   LEVOTHYROXINE (SYNTHROID) 100 MCG TABLET    TAKE 1 TABLET BY MOUTH EVERY DAY 30 MINUTES BEFORE BREAKFAST ON AN EMPTY STOMACH   LOSARTAN (COZAAR) 50 MG TABLET    TAKE 1 TABLET(50 MG) BY MOUTH DAILY   NYSTATIN (MYCOSTATIN/NYSTOP) POWDER    Apply topically 3 (three) times daily.   OXYCODONE-ACETAMINOPHEN (PERCOCET/ROXICET) 5-325  MG TABLET    Take 1 tablet by mouth every 6 (six) hours as needed for severe pain.   TAMOXIFEN (NOLVADEX) 20 MG TABLET    TAKE 1 TABLET(20 MG) BY MOUTH DAILY   TRAMADOL (ULTRAM) 50 MG TABLET    TAKE 1 TABLET BY MOUTH THREE TIMES DAILY AS NEEDED FOR PAIN   TRIAMCINOLONE CREAM (KENALOG) 0.1 %    Apply 1 application topically 2 (two) times daily.   ZINC OXIDE PO    Take 1 tablet by mouth daily.  Modified Medications   No medications on file  Discontinued Medications   No medications on file    Physical Exam:  Vitals:   11/02/19 5909  BP: 128/74  Pulse: 72  Temp: (!) 96.9 F (36.1 C)  TempSrc: Temporal  Weight: 258 lb (117 kg)  Height: '5\' 2"'  (1.575 m)   Body mass index is 47.19 kg/m. Wt Readings from Last 3 Encounters:  11/02/19 258 lb (117 kg)  01/11/19 252 lb 6.4 oz (114.5 kg)  12/20/18 249 lb (112.9 kg)    Physical Exam Constitutional:      General: She is not in acute distress.    Appearance: She is well-developed. She is not diaphoretic.  HENT:     Head: Normocephalic and atraumatic.     Mouth/Throat:     Pharynx: No oropharyngeal exudate.  Eyes:     Conjunctiva/sclera: Conjunctivae normal.     Pupils: Pupils are equal, round, and reactive to light.  Cardiovascular:     Rate and Rhythm: Normal rate and regular rhythm.     Heart sounds: Normal heart sounds.  Pulmonary:     Effort: Pulmonary effort is normal.     Breath sounds: Normal breath sounds.  Abdominal:     General: Bowel sounds are normal.     Palpations: Abdomen is soft.  Genitourinary:    General: Normal vulva.     Vagina: No vaginal discharge.     Rectum: Normal.  Musculoskeletal:        General: No tenderness.     Cervical back: Normal range of motion and neck supple.  Skin:    General: Skin is warm and dry.  Neurological:     Mental Status: She is alert and oriented to person, place, and time.     Labs reviewed: Basic Metabolic Panel: Recent Labs    11/26/18 1037 04/21/19 1852  NA  140 140  K 4.5 3.7  CL 105 106  CO2 28 25  GLUCOSE 91 89  BUN 14 17  CREATININE 0.95* 1.23*  CALCIUM 9.5 9.0  TSH 1.54  --    Liver Function Tests: No results for input(s): AST, ALT, ALKPHOS, BILITOT, PROT, ALBUMIN in the last 8760 hours. No results for input(s): LIPASE, AMYLASE in the last 8760 hours. No results for input(s): AMMONIA in the last 8760 hours. CBC: Recent Labs    11/26/18 1037 12/15/18 1528 04/21/19 1852  WBC 5.2 9.6 8.2  NEUTROABS  --  7,392 5.1  HGB 12.3 12.0 12.6  HCT 37.0 36.6 38.0  MCV 88.5 88.8 87.6  PLT 200 198 219   Lipid Panel: No results for input(s): CHOL, HDL, LDLCALC, TRIG, CHOLHDL, LDLDIRECT in the last 8760 hours. TSH: Recent Labs    11/26/18 1037  TSH 1.54   A1C: Lab Results  Component Value Date   HGBA1C 6.2 (H) 11/26/2018     Assessment/Plan 1. Idiopathic gout, unspecified chronicity, unspecified site No recent flares. Continues on allopurinol daily  - Uric Acid  2. Hyperlipidemia LDL goal <100 -dietary modifications.  - Lipid Panel  3. Other iron deficiency anemia - CBC with Differential/Platelet  4.  Hypothyroidism due to acquired atrophy of thyroid -continues on synthroid 100 mcg - TSH  5. Type 2 diabetes mellitus with stage 2 chronic kidney disease, without long-term current use of insulin (HCC) -continues on glipizide 5 mg with dietary modifications.  - Hemoglobin A1c - CMP with eGFR([facility])  6. History of breast cancer -continues to follow up with oncologist, has completed tamoxifen  7. Essential hypertension, benign -controlled on norvasc and losartan, encouraged dietary modifications as well.  - CBC with Differential/Platelet - CMP with eGFR([facility])  8. Class 3  severe obesity due to excess calories with serious comorbidity and body mass index (BMI) of 45.0 to 49.9 in adult [facility]) -discussed today, stressed importance of weight loss through proper diet modifications and   9. Primary osteoarthritis of both  knees -stable at this time. Using Voltaren gel with good benefit. Tramadol not providing additional benefit, will dc off medication list at this time. Can use tylenol as needed.  - AMB referral to orthopedics  10. Blood in urine - POC Urinalysis Dipstick- done due to reported blood in urine, however no blood noted on UA. She has hx of vaginal bleeding, will refer to GYN due to this.   Next appt: 3 months.  [facility]. [place], [facility] [phone]

## 2019-11-02 NOTE — Patient Instructions (Addendum)
STOP TRAMADOL as it is not effective  To use tylenol 500 mg tablet- can take 1-2 tablets every 8 hours as needed for pain Continue to use Voltaren gel as needed Orthopedic referral placed for further management.

## 2019-11-03 ENCOUNTER — Other Ambulatory Visit: Payer: Self-pay | Admitting: Nurse Practitioner

## 2019-11-03 DIAGNOSIS — Z8742 Personal history of other diseases of the female genital tract: Secondary | ICD-10-CM

## 2019-11-03 LAB — CBC WITH DIFFERENTIAL/PLATELET
Absolute Monocytes: 603 cells/uL (ref 200–950)
Basophils Absolute: 21 cells/uL (ref 0–200)
Basophils Relative: 0.4 %
Eosinophils Absolute: 270 cells/uL (ref 15–500)
Eosinophils Relative: 5.2 %
HCT: 37.5 % (ref 35.0–45.0)
Hemoglobin: 12.3 g/dL (ref 11.7–15.5)
Lymphs Abs: 1243 cells/uL (ref 850–3900)
MCH: 28.9 pg (ref 27.0–33.0)
MCHC: 32.8 g/dL (ref 32.0–36.0)
MCV: 88.2 fL (ref 80.0–100.0)
MPV: 10.9 fL (ref 7.5–12.5)
Monocytes Relative: 11.6 %
Neutro Abs: 3063 cells/uL (ref 1500–7800)
Neutrophils Relative %: 58.9 %
Platelets: 222 10*3/uL (ref 140–400)
RBC: 4.25 10*6/uL (ref 3.80–5.10)
RDW: 13.6 % (ref 11.0–15.0)
Total Lymphocyte: 23.9 %
WBC: 5.2 10*3/uL (ref 3.8–10.8)

## 2019-11-03 LAB — COMPLETE METABOLIC PANEL WITH GFR
AG Ratio: 1.4 (calc) (ref 1.0–2.5)
ALT: 12 U/L (ref 6–29)
AST: 17 U/L (ref 10–35)
Albumin: 3.8 g/dL (ref 3.6–5.1)
Alkaline phosphatase (APISO): 88 U/L (ref 37–153)
BUN/Creatinine Ratio: 10 (calc) (ref 6–22)
BUN: 11 mg/dL (ref 7–25)
CO2: 27 mmol/L (ref 20–32)
Calcium: 9.2 mg/dL (ref 8.6–10.4)
Chloride: 107 mmol/L (ref 98–110)
Creat: 1.08 mg/dL — ABNORMAL HIGH (ref 0.60–0.88)
GFR, Est African American: 56 mL/min/{1.73_m2} — ABNORMAL LOW (ref >=60)
GFR, Est Non African American: 48 mL/min/{1.73_m2} — ABNORMAL LOW (ref >=60)
Globulin: 2.8 g/dL (calc) (ref 1.9–3.7)
Glucose, Bld: 99 mg/dL (ref 65–99)
Potassium: 4.2 mmol/L (ref 3.5–5.3)
Sodium: 140 mmol/L (ref 135–146)
Total Bilirubin: 0.3 mg/dL (ref 0.2–1.2)
Total Protein: 6.6 g/dL (ref 6.1–8.1)

## 2019-11-03 LAB — LIPID PANEL
Cholesterol: 162 mg/dL (ref <200)
HDL: 60 mg/dL (ref >=50)
LDL Cholesterol (Calc): 86 mg/dL (calc)
Non-HDL Cholesterol (Calc): 102 mg/dL (calc) (ref <130)
Total CHOL/HDL Ratio: 2.7 (calc) (ref <5.0)
Triglycerides: 72 mg/dL (ref <150)

## 2019-11-03 LAB — TSH: TSH: 1.91 mIU/L (ref 0.40–4.50)

## 2019-11-03 LAB — URIC ACID: Uric Acid, Serum: 4.7 mg/dL (ref 2.5–7.0)

## 2019-11-03 LAB — HEMOGLOBIN A1C
Hgb A1c MFr Bld: 6.1 % of total Hgb — ABNORMAL HIGH (ref <5.7)
Mean Plasma Glucose: 128 (calc)
eAG (mmol/L): 7.1 (calc)

## 2019-11-09 ENCOUNTER — Ambulatory Visit (INDEPENDENT_AMBULATORY_CARE_PROVIDER_SITE_OTHER): Payer: Medicare Other | Admitting: Family Medicine

## 2019-11-09 ENCOUNTER — Other Ambulatory Visit: Payer: Self-pay

## 2019-11-09 ENCOUNTER — Encounter: Payer: Self-pay | Admitting: Family Medicine

## 2019-11-09 DIAGNOSIS — G8929 Other chronic pain: Secondary | ICD-10-CM

## 2019-11-09 DIAGNOSIS — M25562 Pain in left knee: Secondary | ICD-10-CM

## 2019-11-09 NOTE — Progress Notes (Signed)
Office Visit Note   Patient: Karla Willis           Date of Birth: 12-28-1934           MRN: [id number] Visit Date: 11/09/2019 Requested by: Lauree Chandler, NP [place],  [place] [address] PCP: Lauree Chandler, NP  Subjective: Chief Complaint  Patient presents with  . Right Knee - Pain  . Left Knee - Pain    Intermittent pain in the knees x at least a year. The left knee currently hurts worse than the right. Has had swelling in the past. No popping. No giving way, but does feel a little off balance sometimes. Uses cane sometimes. Pain worse lying in bed.    HPI: She is here with left greater than right knee pain.  Symptoms started about 2 or 3 months ago.  She has a known history of osteoarthritis in her knees.  I injected her right knee last February with good results.  Her left 1 has been hurting mainly on the medial aspect, aching and throbbing pain and stiffness.  Pain is worst when lying down, better when moving.  Denies any locking or giving way.  She has tried Voltaren gel and Tylenol with minimal relief.  She also tried tramadol.  Incidentally she had COVID-19 several months ago.  It was a rough case, but she did not require hospitalization and she is doing well now.              ROS: No fever or chills.  No recent gout attacks.  All other systems were reviewed and are negative.  Objective: Vital Signs: There were no vitals taken for this visit.  Physical Exam:  General:  Alert and oriented, in no acute distress. Pulm:  Breathing unlabored. Psy:  Normal mood, congruent affect. Skin: No rash or erythema. Left knee: 1+ effusion with no warmth.  2+ patellofemoral crepitus in both knees.  Bilateral medial joint line tenderness with no palpable click on McMurray's.  Range of motion is full extension and 110 degrees flexion.  Imaging: None today  Assessment & Plan: 1.  Left greater than right knee pain possibly due to flareup of  osteoarthritis -Discussed options with her, she cannot take oral NSAIDs.  She wants to try a cortisone injection in the left knee.  She can come back for the right knee in the future if needed.     Procedures: Left knee steroid injection: After sterile prep with Betadine, injected 5 cc 1% lidocaine without epinephrine and 40 mg methylprednisolone from superolateral approach, a flash of clear yellow synovial fluid was obtained prior to injection.   PMFS History: Patient Active Problem List   Diagnosis Date Noted  . History of breast cancer 05/20/2017  . Idiopathic gout 05/20/2017  . Hyperlipidemia LDL goal <100 05/20/2017  . Bilateral edema of lower extremity 05/20/2017  . Acute renal failure (ARF) ([facility])   . Acute on chronic renal failure ([place]) 05/25/2016  . ARF (acute renal failure) ([place]) 05/25/2016  . Dyspnea on exertion 05/25/2016  . Anemia 05/25/2016  . Localized primary osteoarthritis of right shoulder region 05/21/2016  . Osteoarthritis, multiple sites 03/09/2015  . Hypothyroidism 03/09/2015  . Goiter 03/09/2015  . Essential hypertension, benign 03/09/2015  . Diabetes mellitus, type 2 ([place]) 03/09/2015  . DCIS (ductal carcinoma in situ) of breast 11/11/2013   Past Medical History:  Diagnosis Date  . Allergy   . Arthritis   . Breast cancer ([facility])  10/24/13   right upper inner- DCIS  . Cancer ([place])   . Diabetes mellitus without complication ([place])   . GERD (gastroesophageal reflux disease)    occ  . Gout   . History of hiatal hernia   . HOH (hard of hearing)   . Hypertension   . Hypothyroidism   . Pneumonia    hx  . Thyroid disease   . Wears glasses     Family History  Problem Relation Age of Onset  . Diabetes Mother   . Arthritis Father   . Seizures Sister   . Pneumonia Daughter   . Cancer Sister        breast  . Breast cancer Sister   . Multiple sclerosis Son        Deceased     Past Surgical History:  Procedure Laterality Date  . BREAST BIOPSY Right  10/26/2013  . BREAST LUMPECTOMY Right 12/16/2013  . BREAST LUMPECTOMY WITH NEEDLE LOCALIZATION Right 12/16/2013   Procedure: BREAST LUMPECTOMY WITH NEEDLE LOCALIZATION;  Surgeon: Edward Jolly, MD;  Location: [facility];  Service: General;  Laterality: Right;  . CATARACT EXTRACTION Left 12/30/2017  . CHOLECYSTECTOMY  1970  . COLONOSCOPY    . FINGER SURGERY     right hand-  . gallbladder sugery    . RE-EXCISION OF BREAST LUMPECTOMY Right 01/30/2014   Procedure: RE-EXCISION OF BREAST LUMPECTOMY;  Surgeon: Edward Jolly, MD;  Location: [facility];  Service: General;  Laterality: Right;  . RE-EXCISION OF BREAST LUMPECTOMY Right 03/06/2014   Procedure: RE-EXCISION OF BREAST LUMPECTOMY;  Surgeon: Edward Jolly, MD;  Location: [facility];  Service: General;  Laterality: Right;  . SHOULDER ARTHROSCOPY  2011   left  . THYROID SURGERY  1970  . TOTAL SHOULDER ARTHROPLASTY Right 05/21/2016  . TOTAL SHOULDER ARTHROPLASTY Right 05/21/2016   Procedure: RIGHT TOTAL SHOULDER ARTHROPLASTY;  Surgeon: Ninetta Lights, MD;  Location: [facility];  Service: Orthopedics;  Laterality: Right;  . TUBAL LIGATION     Social History   Occupational History  . Not on file  Tobacco Use  . Smoking status: Never Smoker  . Smokeless tobacco: Never Used  Substance and Sexual Activity  . Alcohol use: No    Alcohol/week: 0.0 standard drinks  . Drug use: No  . Sexual activity: Not Currently    Comment: menarche age 42, G98, P60, first live birth age 76, menopause at 43, no HRT

## 2019-11-14 ENCOUNTER — Other Ambulatory Visit: Payer: Self-pay

## 2019-11-14 ENCOUNTER — Ambulatory Visit: Payer: Medicare Other | Admitting: Family

## 2019-11-14 ENCOUNTER — Other Ambulatory Visit: Payer: Self-pay | Admitting: *Deleted

## 2019-11-14 DIAGNOSIS — E119 Type 2 diabetes mellitus without complications: Secondary | ICD-10-CM

## 2019-11-14 MED ORDER — GLIPIZIDE ER 5 MG PO TB24: ORAL_TABLET | ORAL | 1 refills | Status: DC

## 2019-11-14 NOTE — Telephone Encounter (Signed)
Patient requested refill Faxed to pharmacy.  

## 2019-11-15 ENCOUNTER — Encounter: Payer: Self-pay | Admitting: Obstetrics & Gynecology

## 2019-11-15 ENCOUNTER — Ambulatory Visit: Payer: Medicare Other | Admitting: Obstetrics & Gynecology

## 2019-11-15 VITALS — BP 124/82 | Ht 62.5 in | Wt 255.0 lb

## 2019-11-15 DIAGNOSIS — N950 Postmenopausal bleeding: Secondary | ICD-10-CM | POA: Diagnosis not present

## 2019-11-15 DIAGNOSIS — Z124 Encounter for screening for malignant neoplasm of cervix: Secondary | ICD-10-CM | POA: Diagnosis not present

## 2019-11-15 DIAGNOSIS — Z853 Personal history of malignant neoplasm of breast: Secondary | ICD-10-CM | POA: Diagnosis not present

## 2019-11-15 DIAGNOSIS — D0511 Intraductal carcinoma in situ of right breast: Secondary | ICD-10-CM

## 2019-11-15 NOTE — Patient Instructions (Signed)
1. Postmenopausal bleeding Postmenopausal bleeding after taking tamoxifen for 5 years.  Tamoxifen was stopped in June 2020.  Successful endometrial biopsy done today, pathology pending.  No complication and well-tolerated.  Patient will follow up with a pelvic ultrasound to complete the investigation.  Counseling done about tamoxifen and postmenopausal bleeding.  Patient voiced understanding and agreement with plan. - US Transvaginal Non-OB; Future  2. Ductal carcinoma in situ (DCIS) of right breast Tamoxifen x 5 years, stopped in 2020.  Geneal, it was a pleasure meeting you today!  I will inform you of your results as soon as they are available.

## 2019-11-15 NOTE — Progress Notes (Signed)
Karla Willis [birth year] [id number]   History:    85 y.o. G5P4A1L3  RP:  New patient presenting for PMB  HPI: PMB around 3rd week of December 2020.  Seen by her Fam MD Dr Dewaine Oats 11/02/2019.  Rt DCIS on Tamoxifen x 5 years, stopped in 04/2019.  Followed by Dr [name].  Past medical history,surgical history, family history and social history were all reviewed and documented in the EPIC chart.  Gynecologic History No LMP recorded. Patient is postmenopausal.  Obstetric History OB History  Gravida Para Term Preterm AB Living  5 4     1 3   SAB TAB Ectopic Multiple Live Births    1          # Outcome Date GA Lbr Len/2nd Weight Sex Delivery Anes PTL Lv  5 Para           4 Para           3 Para           2 Para           1 TAB              ROS: A ROS was performed and pertinent positives and negatives are included in the history.  GENERAL: No fevers or chills. HEENT: No change in vision, no earache, sore throat or sinus congestion. NECK: No pain or stiffness. CARDIOVASCULAR: No chest pain or pressure. No palpitations. PULMONARY: No shortness of breath, cough or wheeze. GASTROINTESTINAL: No abdominal pain, nausea, vomiting or diarrhea, melena or bright red blood per rectum. GENITOURINARY: No urinary frequency, urgency, hesitancy or dysuria. MUSCULOSKELETAL: No joint or muscle pain, no back pain, no recent trauma. DERMATOLOGIC: No rash, no itching, no lesions. ENDOCRINE: No polyuria, polydipsia, no heat or cold intolerance. No recent change in weight. HEMATOLOGICAL: No anemia or easy bruising or bleeding. NEUROLOGIC: No headache, seizures, numbness, tingling or weakness. PSYCHIATRIC: No depression, no loss of interest in normal activity or change in sleep pattern.     Exam:   BP 124/82 (Cuff Size: Large)   Ht 5' 2.5" (1.588 m)   Wt 255 lb (115.7 kg)   BMI 45.90 kg/m   Body mass index is 45.9 kg/m.  General appearance : Well developed well nourished female. No acute  distress HEENT: Eyes: no retinal hemorrhage or exudates,  Neck supple, trachea midline, no carotid bruits, no thyroidmegaly Lungs: Clear to auscultation, no rhonchi or wheezes, or rib retractions  Heart: Regular rate and rhythm, no murmurs or gallops Breast:Examined in sitting and supine position were symmetrical in appearance, no palpable masses or tenderness,  no skin retraction, no nipple inversion, no nipple discharge, no skin discoloration, no axillary or supraclavicular lymphadenopathy Abdomen: no palpable masses or tenderness, no rebound or guarding Extremities: no edema or skin discoloration or tenderness  Pelvic: Vulva: Normal             Vagina: No gross lesions or discharge  Cervix: No gross lesions or discharge.  Pap reflex done.  Uterus  AV, normal size, shape and consistency, non-tender and mobile  Adnexa  Without masses or tenderness  Anus: Normal  Verbal informed consent obtained for endometrial biopsy. EBx procedure:  Speculum:  Betadine prep of the cervix.  Hurricaine spray of the cervix.  The anterior lip of the cervix is grasped with a tenaculum.  Os finder passed easily at the cervix.  Endometrial biopsy cannula inserted in the uterus.  Suction endometrial biopsy on all intra-uterine surfaces.  An abundant specimen was obtained.  The specimen was sent to pathology.  The tenaculum was removed from the cervix and the speculum was removed from the vagina.  Good hemostasis.  No complication and the patient tolerated the procedure well.   Assessment/Plan:  85 y.o. female for annual exam   1. Postmenopausal bleeding Postmenopausal bleeding after taking tamoxifen for 5 years.  Tamoxifen was stopped in June 2020.  Successful endometrial biopsy done today, pathology pending.  No complication and well-tolerated.  Patient will follow up with a pelvic ultrasound to complete the investigation.  Counseling done about tamoxifen and postmenopausal bleeding.  Patient voiced understanding  and agreement with plan. - US Transvaginal Non-OB; Future  2. Ductal carcinoma in situ (DCIS) of right breast Tamoxifen x 5 years, stopped in 2020.  Counseling on above issues and coordination of care >50% x 30 minutes.  Princess Bruins MD, 12:23 PM 11/15/2019

## 2019-11-17 LAB — TISSUE SPECIMEN

## 2019-11-17 LAB — PAP IG W/ RFLX HPV ASCU

## 2019-11-17 LAB — PATHOLOGY REPORT

## 2019-11-22 NOTE — Telephone Encounter (Signed)
Spoke with patient and informed her of results. Advised her to keep u/s appt as scheduled.

## 2019-12-08 ENCOUNTER — Ambulatory Visit: Payer: Medicare Other | Admitting: Obstetrics & Gynecology

## 2019-12-08 ENCOUNTER — Other Ambulatory Visit: Payer: Medicare Other

## 2019-12-19 ENCOUNTER — Other Ambulatory Visit: Payer: Self-pay | Admitting: Nurse Practitioner

## 2020-01-17 ENCOUNTER — Encounter: Payer: Self-pay | Admitting: Nurse Practitioner

## 2020-01-17 ENCOUNTER — Ambulatory Visit (INDEPENDENT_AMBULATORY_CARE_PROVIDER_SITE_OTHER): Payer: Medicare Other | Admitting: Nurse Practitioner

## 2020-01-17 ENCOUNTER — Other Ambulatory Visit: Payer: Self-pay

## 2020-01-17 DIAGNOSIS — Z0000 Encounter for general adult medical examination without abnormal findings: Secondary | ICD-10-CM

## 2020-01-17 DIAGNOSIS — E2839 Other primary ovarian failure: Secondary | ICD-10-CM | POA: Diagnosis not present

## 2020-01-17 NOTE — Patient Instructions (Signed)
Karla Willis , Thank you for taking time to come for your Medicare Wellness Visit. I appreciate your ongoing commitment to your health goals. Please review the following plan we discussed and let me know if I can assist you in the future.   Screening recommendations/referrals: Colonoscopy aged out Mammogram aged out Bone Density OVERDUE_ please call [facility] to schedule.  Recommended yearly ophthalmology/optometry visit for glaucoma screening and checkup Recommended yearly dental visit for hygiene and checkup  Vaccinations: Influenza vaccine up to date Pneumococcal vaccine up to date  Tdap vaccine up to date Shingles vaccine RECOMMENDED to get shringrix at your local pharmacy.     Advanced directives: on file.   Conditions/risks identified: cardiovascular disease, at risk due to weight and other comorbidies   Next appointment: 1 year   Preventive Care 77 Years and Older, Female Preventive care refers to lifestyle choices and visits with your health care provider that can promote health and wellness. What does preventive care include?  A yearly physical exam. This is also called an annual well check.  Dental exams once or twice a year.  Routine eye exams. Ask your health care provider how often you should have your eyes checked.  Personal lifestyle choices, including:  Daily care of your teeth and gums.  Regular physical activity.  Eating a healthy diet.  Avoiding tobacco and drug use.  Limiting alcohol use.  Practicing safe sex.  Taking low-dose aspirin every day.  Taking vitamin and mineral supplements as recommended by your health care provider. What happens during an annual well check? The services and screenings done by your health care provider during your annual well check will depend on your age, overall health, lifestyle risk factors, and family history of disease. Counseling  Your health care provider may ask you questions about  your:  Alcohol use.  Tobacco use.  Drug use.  Emotional well-being.  Home and relationship well-being.  Sexual activity.  Eating habits.  History of falls.  Memory and ability to understand (cognition).  Work and work Statistician.  Reproductive health. Screening  You may have the following tests or measurements:  Height, weight, and BMI.  Blood pressure.  Lipid and cholesterol levels. These may be checked every 5 years, or more frequently if you are over 90 years old.  Skin check.  Lung cancer screening. You may have this screening every year starting at age 43 if you have a 30-pack-year history of smoking and currently smoke or have quit within the past 15 years.  Fecal occult blood test (FOBT) of the stool. You may have this test every year starting at age 51.  Flexible sigmoidoscopy or colonoscopy. You may have a sigmoidoscopy every 5 years or a colonoscopy every 10 years starting at age 28.  Hepatitis C blood test.  Hepatitis B blood test.  Sexually transmitted disease (STD) testing.  Diabetes screening. This is done by checking your blood sugar (glucose) after you have not eaten for a while (fasting). You may have this done every 1-3 years.  Bone density scan. This is done to screen for osteoporosis. You may have this done starting at age 100.  Mammogram. This may be done every 1-2 years. Talk to your health care provider about how often you should have regular mammograms. Talk with your health care provider about your test results, treatment options, and if necessary, the need for more tests. Vaccines  Your health care provider may recommend certain vaccines, such as:  Influenza vaccine. This  is recommended every year.  Tetanus, diphtheria, and acellular pertussis (Tdap, Td) vaccine. You may need a Td booster every 10 years.  Zoster vaccine. You may need this after age 79.  Pneumococcal 13-valent conjugate (PCV13) vaccine. One dose is recommended  after age 27.  Pneumococcal polysaccharide (PPSV23) vaccine. One dose is recommended after age 67. Talk to your health care provider about which screenings and vaccines you need and how often you need them. This information is not intended to replace advice given to you by your health care provider. Make sure you discuss any questions you have with your health care provider. Document Released: 11/23/2015 Document Revised: 07/16/2016 Document Reviewed: 08/28/2015 Elsevier Interactive Patient Education  2017 [place] Prevention in the Home Falls can cause injuries. They can happen to people of all ages. There are many things you can do to make your home safe and to help prevent falls. What can I do on the outside of my home?  Regularly fix the edges of walkways and driveways and fix any cracks.  Remove anything that might make you trip as you walk through a door, such as a raised step or threshold.  Trim any bushes or trees on the path to your home.  Use bright outdoor lighting.  Clear any walking paths of anything that might make someone trip, such as rocks or tools.  Regularly check to see if handrails are loose or broken. Make sure that both sides of any steps have handrails.  Any raised decks and porches should have guardrails on the edges.  Have any leaves, snow, or ice cleared regularly.  Use sand or salt on walking paths during winter.  Clean up any spills in your garage right away. This includes oil or grease spills. What can I do in the bathroom?  Use night lights.  Install grab bars by the toilet and in the tub and shower. Do not use towel bars as grab bars.  Use non-skid mats or decals in the tub or shower.  If you need to sit down in the shower, use a plastic, non-slip stool.  Keep the floor dry. Clean up any water that spills on the floor as soon as it happens.  Remove soap buildup in the tub or shower regularly.  Attach bath mats securely with  double-sided non-slip rug tape.  Do not have throw rugs and other things on the floor that can make you trip. What can I do in the bedroom?  Use night lights.  Make sure that you have a light by your bed that is easy to reach.  Do not use any sheets or blankets that are too big for your bed. They should not hang down onto the floor.  Have a firm chair that has side arms. You can use this for support while you get dressed.  Do not have throw rugs and other things on the floor that can make you trip. What can I do in the kitchen?  Clean up any spills right away.  Avoid walking on wet floors.  Keep items that you use a lot in easy-to-reach places.  If you need to reach something above you, use a strong step stool that has a grab bar.  Keep electrical cords out of the way.  Do not use floor polish or wax that makes floors slippery. If you must use wax, use non-skid floor wax.  Do not have throw rugs and other things on the floor that can make you  trip. What can I do with my stairs?  Do not leave any items on the stairs.  Make sure that there are handrails on both sides of the stairs and use them. Fix handrails that are broken or loose. Make sure that handrails are as long as the stairways.  Check any carpeting to make sure that it is firmly attached to the stairs. Fix any carpet that is loose or worn.  Avoid having throw rugs at the top or bottom of the stairs. If you do have throw rugs, attach them to the floor with carpet tape.  Make sure that you have a light switch at the top of the stairs and the bottom of the stairs. If you do not have them, ask someone to add them for you. What else can I do to help prevent falls?  Wear shoes that:  Do not have high heels.  Have rubber bottoms.  Are comfortable and fit you well.  Are closed at the toe. Do not wear sandals.  If you use a stepladder:  Make sure that it is fully opened. Do not climb a closed stepladder.  Make  sure that both sides of the stepladder are locked into place.  Ask someone to hold it for you, if possible.  Clearly mark and make sure that you can see:  Any grab bars or handrails.  First and last steps.  Where the edge of each step is.  Use tools that help you move around (mobility aids) if they are needed. These include:  Canes.  Walkers.  Scooters.  Crutches.  Turn on the lights when you go into a dark area. Replace any light bulbs as soon as they burn out.  Set up your furniture so you have a clear path. Avoid moving your furniture around.  If any of your floors are uneven, fix them.  If there are any pets around you, be aware of where they are.  Review your medicines with your doctor. Some medicines can make you feel dizzy. This can increase your chance of falling. Ask your doctor what other things that you can do to help prevent falls. This information is not intended to replace advice given to you by your health care provider. Make sure you discuss any questions you have with your health care provider. Document Released: 08/23/2009 Document Revised: 04/03/2016 Document Reviewed: 12/01/2014 Elsevier Interactive Patient Education  2017 [employer].

## 2020-01-17 NOTE — Progress Notes (Signed)
Subjective:   Karla Willis is a 85 y.o. female who presents for Medicare Annual (Subsequent) preventive examination.  Review of Systems:   Cardiac Risk Factors include: advanced age (>83men, >85 women);diabetes mellitus;hypertension;sedentary lifestyle;obesity (BMI >30kg/m2)     Objective:     Vitals: There were no vitals taken for this visit.  There is no height or weight on file to calculate BMI.  Advanced Directives 01/17/2020 04/21/2019 01/11/2019 12/15/2018 11/26/2018 10/04/2018 04/22/2018  Does Patient Have a Medical Advance Directive? Yes Yes Yes Yes Yes Yes Yes  Type of Paramedic of [place];Out of facility DNR (pink MOST or yellow form);Living will Living will;Healthcare Power of [place];Living will;Out of facility DNR (pink MOST or yellow form) Out of facility DNR (pink MOST or yellow form);Living will [place];Living will [place];Living will;Out of facility DNR (pink MOST or yellow form) [place];Living will;Out of facility DNR (pink MOST or yellow form)  Does patient want to make changes to medical advance directive? No - Patient declined No - Patient declined No - Patient declined No - Patient declined No - Patient declined - -  Copy of [place] in Chart? Yes - validated most recent copy scanned in chart (See row information) No - copy requested Yes - validated most recent copy scanned in chart (See row information) Yes - validated most recent copy scanned in chart (See row information) Yes - validated most recent copy scanned in chart (See row information) Yes - validated most recent copy scanned in chart (See row information) Yes  Pre-existing out of facility DNR order (yellow form or pink MOST form) Pink MOST/Yellow Form most recent copy in chart - Physician notified to receive inpatient order - Yellow form placed in chart (order not valid for  inpatient use);Pink MOST form placed in chart (order not valid for inpatient use) - - Yellow form placed in chart (order not valid for inpatient use);Pink MOST form placed in chart (order not valid for inpatient use) Yellow form placed in chart (order not valid for inpatient use)    Tobacco Social History   Tobacco Use  Smoking Status Never Smoker  Smokeless Tobacco Never Used     Counseling given: Not Answered   Clinical Intake:  Pre-visit preparation completed: Yes  Pain : No/denies pain     BMI - recorded: 45 Nutritional Status: BMI > 30  Obese Nutritional Risks: None Diabetes: Yes CBG done?: No Did pt. bring in CBG monitor from home?: No  How often do you need to have someone help you when you read instructions, pamphlets, or other written materials from your doctor or pharmacy?: 1 - Never  Interpreter Needed?: No     Past Medical History:  Diagnosis Date  . Allergy   . Arthritis   . 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 Cancer ([employer])   . Diabetes mellitus without complication ([place])   . GERD (gastroesophageal reflux disease)    occ  . Gout   . History of hiatal hernia   . HOH (hard of hearing)   . Hypertension   . Hypothyroidism   . Pneumonia    hx  . Thyroid disease   . Wears glasses    Past Surgical History:  Procedure Laterality Date  . BREAST BIOPSY Right 10/26/2013  . BREAST LUMPECTOMY Right 12/16/2013  . BREAST LUMPECTOMY WITH NEEDLE LOCALIZATION Right 12/16/2013   Procedure:  BREAST LUMPECTOMY WITH NEEDLE LOCALIZATION;  Surgeon: Edward Jolly, MD;  Location: [facility];  Service: General;  Laterality: Right;  . CATARACT EXTRACTION Left 12/30/2017  . CHOLECYSTECTOMY  1970  . COLONOSCOPY    . FINGER SURGERY     right hand-  . gallbladder sugery    . RE-EXCISION OF BREAST LUMPECTOMY Right 01/30/2014   Procedure: RE-EXCISION OF BREAST LUMPECTOMY;  Surgeon: Edward Jolly, MD;  Location: [facility];  Service: General;  Laterality: Right;  . RE-EXCISION OF BREAST LUMPECTOMY Right 03/06/2014   Procedure: RE-EXCISION OF BREAST LUMPECTOMY;  Surgeon: Edward Jolly, MD;  Location: [facility];  Service: General;  Laterality: Right;  . SHOULDER ARTHROSCOPY  2011   left  . THYROID SURGERY  1970  . TOTAL SHOULDER ARTHROPLASTY Right 05/21/2016  . TOTAL SHOULDER ARTHROPLASTY Right 05/21/2016   Procedure: RIGHT TOTAL SHOULDER ARTHROPLASTY;  Surgeon: Ninetta Lights, MD;  Location: [facility];  Service: Orthopedics;  Laterality: Right;  . TUBAL LIGATION     Family History  Problem Relation Age of Onset  . Diabetes Mother   . Arthritis Father   . Seizures Sister   . Pneumonia Daughter   . Cancer Sister        breast  . Breast cancer Sister   . Multiple sclerosis Son        Deceased    Social History   Socioeconomic History  . Marital status: Widowed    Spouse name: Not on file  . Number of children: Not on file  . Years of education: Not on file  . Highest education level: Not on file  Occupational History  . Not on file  Tobacco Use  . Smoking status: Never Smoker  . Smokeless tobacco: Never Used  Substance and Sexual Activity  . Alcohol use: No    Alcohol/week: 0.0 standard drinks  . Drug use: No  . Sexual activity: Not Currently    Comment: menarche age 58,1st intercourse 85 yo-Fewer than 5 partners  Other Topics Concern  . Not on file  Social History Narrative   Diet: No      Do you drink/ eat things with caffeine? Yes      Marital status:    Widowed                           What year were you married ?  1956      Do you live in a house, apartment,assistred living, condo, trailer, etc.)?  apartment      Is it one or more stories?  one      How many persons live in your home ?  3      Do you have any pets in your home ?(please list)  No      Current or past profession:  Bank, Caregiver      Do you exercise?  No                             Type & how often:        Do you have a living will?  Yes      Do you have a DNR form?  Yes                     If not, do you want to discuss one?  Do you have signed POA?HPOA forms?                 If so, please bring to your        appointment      Social Determinants of Health   Financial Resource Strain:   . Difficulty of Paying Living Expenses: Not on file  Food Insecurity:   . Worried About Charity fundraiser in the Last Year: Not on file  . Ran Out of Food in the Last Year: Not on file  Transportation Needs:   . Lack of Transportation (Medical): Not on file  . Lack of Transportation (Non-Medical): Not on file  Physical Activity:   . Days of Exercise per Week: Not on file  . Minutes of Exercise per Session: Not on file  Stress:   . Feeling of Stress : Not on file  Social Connections:   . Frequency of Communication with Friends and Family: Not on file  . Frequency of Social Gatherings with Friends and Family: Not on file  . Attends Religious Services: Not on file  . Active Member of Clubs or Organizations: Not on file  . Attends Archivist Meetings: Not on file  . Marital Status: Not on file    Outpatient Encounter Medications as of 01/17/2020  Medication Sig  . acetaminophen (TYLENOL) 500 MG tablet Take 500 mg by mouth as needed.  Marland Kitchen allopurinol (ZYLOPRIM) 100 MG tablet TAKE 1 TABLET BY MOUTH EVERY DAY FOR GOUT  . amLODipine (NORVASC) 5 MG tablet TAKE 1 TABLET BY MOUTH EVERY DAY  . diclofenac sodium (VOLTAREN) 1 % GEL Apply 4 g topically 4 (four) times daily as needed.  Marland Kitchen glipiZIDE (GLIPIZIDE XL) 5 MG 24 hr tablet Take one tablet by mouth once daily with Breakfast  . levothyroxine (SYNTHROID) 100 MCG tablet TAKE 1 TABLET BY MOUTH EVERY DAY 30 MINUTES BEFORE BREAKFAST ON AN EMPTY STOMACH  . losartan (COZAAR) 50 MG tablet TAKE 1 TABLET(50 MG) BY MOUTH DAILY  . triamcinolone cream (KENALOG) 0.1 % Apply 1 application topically 2 (two) times daily.  Marland Kitchen  ZINC OXIDE PO Take 1 tablet by mouth daily.  . [DISCONTINUED] nystatin (MYCOSTATIN/NYSTOP) powder Apply topically 3 (three) times daily.   No facility-administered encounter medications on file as of 01/17/2020.    Activities of Daily Living In your present state of health, do you have any difficulty performing the following activities: 01/17/2020  Hearing? Y  Vision? N  Difficulty concentrating or making decisions? N  Walking or climbing stairs? N  Dressing or bathing? N  Doing errands, shopping? N  Preparing Food and eating ? N  Using the Toilet? N  In the past six months, have you accidently leaked urine? Y  Do you have problems with loss of bowel control? N  Managing your Medications? N  Managing your Finances? N  Housekeeping or managing your Housekeeping? N  Some recent data might be hidden    Patient Care Team: Lauree Chandler, NP as PCP - General (Geriatric Medicine) Nicholas Lose, MD as Consulting Physician (Hematology and Oncology) [facility]ists, [place] as Consulting Physician (Orthopedic Surgery)    Assessment:   This is a routine wellness examination for Star.  Exercise Activities and Dietary recommendations Current Exercise Habits: The patient does not participate in regular exercise at present  Goals    . DIET - INCREASE WATER INTAKE     Patient wiill keep water bottles with her to increase water intake    .  Increase water intake     Starting 01/14/17, I will attempt to increase my water intake from 4 bottles of water to 6 bottles of water per day.        Fall Risk Fall Risk  01/17/2020 11/02/2019 05/11/2019 04/21/2019 01/11/2019  Falls in the past year? 0 0 0 0 0  Number falls in past yr: 0 0 0 0 0  Injury with Fall? 0 0 0 0 0   Is the patient's home free of loose throw rugs in walkways, pet beds, electrical cords, etc?   yes      Grab bars in the bathroom? no      Handrails on the stairs?   no stairs      Adequate lighting?   yes  Timed  Get Up and Go performed: na  Depression Screen PHQ 2/9 Scores 01/17/2020 05/11/2019 01/11/2019 01/22/2018  PHQ - 2 Score 0 0 0 0     Cognitive Function MMSE - Mini Mental State Exam 01/11/2019 01/07/2018 01/14/2017 01/11/2016  Not completed: - - - (No Data)  Orientation to time 5 5 5 5   Orientation to Place 5 5 5 5   Registration 3 3 3 3   Attention/ Calculation 5 5 5 5   Recall 3 1 3 2   Language- name 2 objects 2 2 2 2   Language- repeat 1 1 1 1   Language- follow 3 step command 3 3 3 3   Language- read & follow direction 1 1 1 1   Write a sentence 1 1 1 1   Copy design 1 1 1 1   Total score 30 28 30 29      6CIT Screen 01/17/2020  What Year? 0 points  What month? 0 points  What time? 0 points  Count back from 20 0 points  Months in reverse 0 points  Repeat phrase 0 points  Total Score 0    Immunization History  Administered Date(s) Administered  . Influenza, High Dose Seasonal PF 09/01/2019  . Influenza,inj,Quad PF,6+ Mos 09/07/2015, 07/25/2016, 09/22/2017  . Influenza-Unspecified 08/10/2014, 10/04/2018  . Pneumococcal Conjugate-13 01/14/2017  . Pneumococcal-Unspecified 11/10/2013  . Tdap 04/18/2016  . Zoster 04/18/2012    Qualifies for Shingles Vaccine?yes, recommended.   Screening Tests Health Maintenance  Topic Date Due  . FOOT EXAM  05/26/2019  . HEMOGLOBIN A1C  05/02/2020  . OPHTHALMOLOGY EXAM  09/27/2020  . TETANUS/TDAP  04/18/2026  . INFLUENZA VACCINE  Completed  . DEXA SCAN  Completed  . PNA vac Low Risk Adult  Completed    Cancer Screenings: Lung: Low Dose CT Chest recommended if Age 12-80 years, 30 pack-year currently smoking OR have quit w/in 15years. Patient does not qualify. Breast:  Up to date on Mammogram? Aged out, still prefers to get   Up to date of Bone Density/Dexa? No Colorectal: aged out  Additional Screenings:  Hepatitis C Screening: na     Plan:      I have personally reviewed and noted the following in the patient's chart:   . Medical and  social history . Use of alcohol, tobacco or illicit drugs  . Current medications and supplements . Functional ability and status . Nutritional status . Physical activity . Advanced directives . List of other physicians . Hospitalizations, surgeries, and ER visits in previous 12 months . Vitals . Screenings to include cognitive, depression, and falls . Referrals and appointments  In addition, I have reviewed and discussed with patient certain preventive protocols, quality metrics, and best practice recommendations. A written personalized  care plan for preventive services as well as general preventive health recommendations were provided to patient.     Lauree Chandler, NP  01/17/2020

## 2020-01-17 NOTE — Progress Notes (Signed)
   This service is provided via telemedicine  No vital signs collected/recorded due to the encounter was a telemedicine visit.   Location of patient (ex: home, work):  Home  Patient consents to a telephone visit:  Yes  Location of the provider (ex: office, home):  [place].  Name of any referring provider:  N/A  Names of all persons participating in the telemedicine service and their role in the encounter:  Patient, Heriberto Antigua, RMA, Sherrie Mustache, NP.    Time spent on call:  8 minutes on the phone with Medical Assistant.

## 2020-01-30 ENCOUNTER — Encounter: Payer: Self-pay | Admitting: Nurse Practitioner

## 2020-01-30 ENCOUNTER — Other Ambulatory Visit: Payer: Self-pay

## 2020-01-30 ENCOUNTER — Ambulatory Visit (INDEPENDENT_AMBULATORY_CARE_PROVIDER_SITE_OTHER): Payer: Medicare Other | Admitting: Nurse Practitioner

## 2020-01-30 VITALS — BP 128/70 | HR 74 | Temp 97.3°F | Ht 63.0 in | Wt 262.0 lb

## 2020-01-30 DIAGNOSIS — E034 Atrophy of thyroid (acquired): Secondary | ICD-10-CM

## 2020-01-30 DIAGNOSIS — N3281 Overactive bladder: Secondary | ICD-10-CM | POA: Diagnosis not present

## 2020-01-30 DIAGNOSIS — N182 Chronic kidney disease, stage 2 (mild): Secondary | ICD-10-CM | POA: Diagnosis not present

## 2020-01-30 DIAGNOSIS — Z7189 Other specified counseling: Secondary | ICD-10-CM | POA: Diagnosis not present

## 2020-01-30 DIAGNOSIS — D508 Other iron deficiency anemias: Secondary | ICD-10-CM | POA: Diagnosis not present

## 2020-01-30 DIAGNOSIS — M1000 Idiopathic gout, unspecified site: Secondary | ICD-10-CM | POA: Diagnosis not present

## 2020-01-30 DIAGNOSIS — E1122 Type 2 diabetes mellitus with diabetic chronic kidney disease: Secondary | ICD-10-CM | POA: Diagnosis not present

## 2020-01-30 DIAGNOSIS — E785 Hyperlipidemia, unspecified: Secondary | ICD-10-CM

## 2020-01-30 DIAGNOSIS — Z6841 Body Mass Index (BMI) 40.0 and over, adult: Secondary | ICD-10-CM

## 2020-01-30 DIAGNOSIS — R0683 Snoring: Secondary | ICD-10-CM

## 2020-01-30 MED ORDER — MIRABEGRON ER 25 MG PO TB24: 25.0000 mg | ORAL_TABLET | Freq: Every day | ORAL | 3 refills | Status: DC

## 2020-01-30 NOTE — Progress Notes (Signed)
Careteam: Patient Care Team: Lauree Chandler, NP as PCP - General (Geriatric Medicine) Nicholas Lose, MD as Consulting Physician (Hematology and Oncology) Specialists, [place] as Consulting Physician (Orthopedic Surgery)  PLACE OF SERVICE:  [place] Directive information Does Patient Have a Medical Advance Directive?: Yes, Type of Advance Directive: [place];Living will;Out of facility DNR (pink MOST or yellow form), Pre-existing out of facility DNR order (yellow form or pink MOST form): Yellow form placed in chart (order not valid for inpatient use);Pink MOST form placed in chart (order not valid for inpatient use), Does patient want to make changes to medical advance directive?: No - Patient declined  Allergies  Allergen Reactions  . Actos [Pioglitazone] Swelling  . Hydrocodone Nausea Only and Other (See Comments)    "bloated"  . Codeine Rash  . Neurontin [Gabapentin] Palpitations and Other (See Comments)    dizziness  . Other Rash    Nuts--PECANS  . Peanut-Containing Drug Products Rash    Chief Complaint  Patient presents with  . Medical Management of Chronic Issues    3 month follow-up   . Advanced Directive    Reactivate DNR order   . FYI    3 weeks ago patient had a spike in B/P and a Headache      HPI: Patient is a 85 y.o. female for routine follow up.   OA bilateral knees- injections per ortho but stated she may need more medication however worried about side effects with that. Uses pain patch which helps. Continues on Voltaren gel.   Obesity-weight is up, reports she is eating twice daily not moving around. Has tried to walked more. Knees hurt.  Dm- using glipizide, a1c 6.1. no low blood sugars.   Does not sleep well. Will get up to go to the bathroom around 3-4 then can not go back to bed.  Goes to bed at 1 am.  Getting up to urinate every few hours. Goes to the bathroom frequently during the day as  well. No pain with urination. Increase frequency is long standing.  Gout- continues on allopurinol, no recent flares. Uric acid level 4.7 in dec  Hx of breast cancer- completed treatment, following with oncologist yearly  Spinal stenosis- controlled at this time.   htn- controlled at this time.    Review of Systems:  Review of Systems  Constitutional: Negative for chills, fever and weight loss.  HENT: Negative for tinnitus.   Respiratory: Negative for cough, sputum production and shortness of breath.   Cardiovascular: Negative for chest pain, palpitations and leg swelling.  Gastrointestinal: Negative for abdominal pain, constipation, diarrhea and heartburn.  Genitourinary: Positive for frequency. Negative for dysuria and urgency.  Musculoskeletal: Positive for joint pain (knee pain). Negative for back pain, falls and myalgias.  Skin: Negative.   Neurological: Negative for dizziness and headaches.  Psychiatric/Behavioral: Negative for depression and memory loss. The patient does not have insomnia.     Past Medical History:  Diagnosis Date  . Allergy   . Arthritis   . 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 Cancer ([place])   . Diabetes mellitus without complication ([place])   . GERD (gastroesophageal reflux disease)    occ  . Gout   . History of hiatal hernia   . HOH (hard of hearing)   . Hypertension   . Hypothyroidism   . Pneumonia    hx  . Thyroid disease   . Wears  glasses    Past Surgical History:  Procedure Laterality Date  . BREAST BIOPSY Right 10/26/2013  . BREAST LUMPECTOMY Right 12/16/2013  . BREAST LUMPECTOMY WITH NEEDLE LOCALIZATION Right 12/16/2013   Procedure: BREAST LUMPECTOMY WITH NEEDLE LOCALIZATION;  Surgeon: Edward Jolly, MD;  Location: [facility];  Service: General;  Laterality: Right;  . CATARACT EXTRACTION Left 12/30/2017  . CHOLECYSTECTOMY  1970  . COLONOSCOPY    . FINGER SURGERY     right hand-  .  gallbladder sugery    . RE-EXCISION OF BREAST LUMPECTOMY Right 01/30/2014   Procedure: RE-EXCISION OF BREAST LUMPECTOMY;  Surgeon: Edward Jolly, MD;  Location: [place];  Service: General;  Laterality: Right;  . RE-EXCISION OF BREAST LUMPECTOMY Right 03/06/2014   Procedure: RE-EXCISION OF BREAST LUMPECTOMY;  Surgeon: Edward Jolly, MD;  Location: [facility];  Service: General;  Laterality: Right;  . SHOULDER ARTHROSCOPY  2011   left  . THYROID SURGERY  1970  . TOTAL SHOULDER ARTHROPLASTY Right 05/21/2016  . TOTAL SHOULDER ARTHROPLASTY Right 05/21/2016   Procedure: RIGHT TOTAL SHOULDER ARTHROPLASTY;  Surgeon: Ninetta Lights, MD;  Location: [facility];  Service: Orthopedics;  Laterality: Right;  . TUBAL LIGATION     Social History:   reports that she has never smoked. She has never used smokeless tobacco. She reports that she does not drink alcohol or use drugs.  Family History  Problem Relation Age of Onset  . Diabetes Mother   . Arthritis Father   . Seizures Sister   . Pneumonia Daughter   . Cancer Sister        breast  . Breast cancer Sister   . Multiple sclerosis Son        Deceased     Medications: Patient's Medications  New Prescriptions   No medications on file  Previous Medications   ACETAMINOPHEN (TYLENOL) 500 MG TABLET    Take 500 mg by mouth as needed.   ALLOPURINOL (ZYLOPRIM) 100 MG TABLET    TAKE 1 TABLET BY MOUTH EVERY DAY FOR GOUT   AMLODIPINE (NORVASC) 5 MG TABLET    TAKE 1 TABLET BY MOUTH EVERY DAY   DICLOFENAC SODIUM (VOLTAREN) 1 % GEL    Apply 4 g topically 4 (four) times daily as needed.   GLIPIZIDE (GLIPIZIDE XL) 5 MG 24 HR TABLET    Take one tablet by mouth once daily with Breakfast   LEVOTHYROXINE (SYNTHROID) 100 MCG TABLET    TAKE 1 TABLET BY MOUTH EVERY DAY 30 MINUTES BEFORE BREAKFAST ON AN EMPTY STOMACH   LOSARTAN (COZAAR) 50 MG TABLET    TAKE 1 TABLET(50 MG) BY MOUTH DAILY   TRIAMCINOLONE CREAM (KENALOG) 0.1 %     Apply 1 application topically 2 (two) times daily.   ZINC OXIDE PO    Take 1 tablet by mouth daily.  Modified Medications   No medications on file  Discontinued Medications   No medications on file    Physical Exam:  Vitals:   01/30/20 1307  BP: 128/70  Pulse: 74  Temp: (!) 97.3 F (36.3 C)  TempSrc: Temporal  SpO2: 99%  Weight: 262 lb (118.8 kg)  Height: 5\' 3"  (1.6 m)   Body mass index is 46.41 kg/m. Wt Readings from Last 3 Encounters:  01/30/20 262 lb (118.8 kg)  11/15/19 255 lb (115.7 kg)  11/02/19 258 lb (117 kg)    Physical Exam Constitutional:      General: She is  not in acute distress.    Appearance: She is well-developed. She is not diaphoretic.  HENT:     Head: Normocephalic and atraumatic.     Mouth/Throat:     Pharynx: No oropharyngeal exudate.  Eyes:     Conjunctiva/sclera: Conjunctivae normal.     Pupils: Pupils are equal, round, and reactive to light.  Cardiovascular:     Rate and Rhythm: Normal rate and regular rhythm.     Heart sounds: Normal heart sounds.  Pulmonary:     Effort: Pulmonary effort is normal.     Breath sounds: Normal breath sounds.  Abdominal:     General: Bowel sounds are normal.     Palpations: Abdomen is soft.  Musculoskeletal:        General: No tenderness.     Cervical back: Normal range of motion and neck supple.  Skin:    General: Skin is warm and dry.  Neurological:     Mental Status: She is alert and oriented to person, place, and time.    Labs reviewed: Basic Metabolic Panel: Recent Labs    04/21/19 1852 11/02/19 0945  NA 140 140  K 3.7 4.2  CL 106 107  CO2 25 27  GLUCOSE 89 99  BUN 17 11  CREATININE 1.23* 1.08*  CALCIUM 9.0 9.2  TSH  --  1.91   Liver Function Tests: Recent Labs    11/02/19 0945  AST 17  ALT 12  BILITOT 0.3  PROT 6.6   No results for input(s): LIPASE, AMYLASE in the last 8760 hours. No results for input(s): AMMONIA in the last 8760 hours. CBC: Recent Labs    04/21/19 1852  11/02/19 0945  WBC 8.2 5.2  NEUTROABS 5.1 3,063  HGB 12.6 12.3  HCT 38.0 37.5  MCV 87.6 88.2  PLT 219 222   Lipid Panel: Recent Labs    11/02/19 0945  CHOL 162  HDL 60  LDLCALC 86  TRIG 72  CHOLHDL 2.7   TSH: Recent Labs    11/02/19 0945  TSH 1.91   A1C: Lab Results  Component Value Date   HGBA1C 6.1 (H) 11/02/2019     Assessment/Plan 1. Overactive bladder -reports increase in frequency of urination which is causing her to get up a lot at night. Will add mirbetriq to help with symptoms  - mirabegron ER (MYRBETRIQ) 25 MG TB24 tablet; Take 1 tablet (25 mg total) by mouth daily.  Dispense: 30 tablet; Refill: 3  2. Advanced care planning/counseling discussion -plans to review MOST form and we will complete at next OV - DNR (Do Not Resuscitate)  3. Class 3 severe obesity due to excess calories with serious comorbidity and body mass index (BMI) of 45.0 to 49.9 in adult [facility]) -discussed weight and need for weight loss through diet and exercise. She is agreeable to referral for medical weight management for healthy lifestyle modifications.  - Amb Ref to Medical Weight Management - Ambulatory referral to Pulmonology  4. Idiopathic gout, unspecified chronicity, unspecified site -no recent gout flares  5. Hyperlipidemia LDL goal <70 -not on medication at this time. Encouraged dietary modifications.  - COMPLETE METABOLIC PANEL WITH GFR  6. Other iron deficiency anemia -will follow up lab - CBC with Differential/Platelet  7. Hypothyroidism due to acquired atrophy of thyroid -continues on synthroid 100 mcg  8. Type 2 diabetes mellitus with stage 2 chronic kidney disease, without long-term current use of insulin (HCC) -continues on glipizide 5 mg daily, no hypoglycemia -Encouraged dietary compliance,  routine foot care/monitoring and to keep up with diabetic eye exams through ophthalmology  - Hemoglobin A1c - COMPLETE METABOLIC PANEL WITH GFR - CBC with  Differential/Platelet  9. Snoring -snoring, with a lot of issues sleeping with obesity, concern over OSA, will refer to pulmonary for further evaluation and work up. - Ambulatory referral to Pulmonology   Next appt: 1 month for overactive bladder and MOST form completion in office  Donnovan Stamour K. [place], [facility] [phone]

## 2020-01-30 NOTE — Patient Instructions (Addendum)
To call [facility] to schedule BONE DENSITY   Phone: [phone]   To start myrbetriq 25 mg by mouth daily for overactive bladder.   To call and get your toenails cut   Will refer you to pulmonary for testing for SLEEP APNEA    Follow up in 1 month for MOST FORM COMPLETION and OVERACTIVE BLADDER

## 2020-01-31 LAB — CBC WITH DIFFERENTIAL/PLATELET
Absolute Monocytes: 515 cells/uL (ref 200–950)
Basophils Absolute: 32 cells/uL (ref 0–200)
Basophils Relative: 0.7 %
Eosinophils Absolute: 101 cells/uL (ref 15–500)
Eosinophils Relative: 2.2 %
HCT: 39.3 % (ref 35.0–45.0)
Hemoglobin: 12.8 g/dL (ref 11.7–15.5)
Lymphs Abs: 1306 cells/uL (ref 850–3900)
MCH: 29.1 pg (ref 27.0–33.0)
MCHC: 32.6 g/dL (ref 32.0–36.0)
MCV: 89.3 fL (ref 80.0–100.0)
MPV: 10.9 fL (ref 7.5–12.5)
Monocytes Relative: 11.2 %
Neutro Abs: 2645 cells/uL (ref 1500–7800)
Neutrophils Relative %: 57.5 %
Platelets: 230 10*3/uL (ref 140–400)
RBC: 4.4 10*6/uL (ref 3.80–5.10)
RDW: 13.8 % (ref 11.0–15.0)
Total Lymphocyte: 28.4 %
WBC: 4.6 10*3/uL (ref 3.8–10.8)

## 2020-01-31 LAB — COMPLETE METABOLIC PANEL WITH GFR
AG Ratio: 1.2 (calc) (ref 1.0–2.5)
ALT: 11 U/L (ref 6–29)
AST: 16 U/L (ref 10–35)
Albumin: 3.7 g/dL (ref 3.6–5.1)
Alkaline phosphatase (APISO): 81 U/L (ref 37–153)
BUN/Creatinine Ratio: 16 (calc) (ref 6–22)
BUN: 16 mg/dL (ref 7–25)
CO2: 31 mmol/L (ref 20–32)
Calcium: 10.1 mg/dL (ref 8.6–10.4)
Chloride: 107 mmol/L (ref 98–110)
Creat: 1.02 mg/dL — ABNORMAL HIGH (ref 0.60–0.88)
GFR, Est African American: 59 mL/min/{1.73_m2} — ABNORMAL LOW (ref >=60)
GFR, Est Non African American: 51 mL/min/{1.73_m2} — ABNORMAL LOW (ref >=60)
Globulin: 3.2 g/dL (calc) (ref 1.9–3.7)
Glucose, Bld: 135 mg/dL — ABNORMAL HIGH (ref 65–99)
Potassium: 4.6 mmol/L (ref 3.5–5.3)
Sodium: 144 mmol/L (ref 135–146)
Total Bilirubin: 0.4 mg/dL (ref 0.2–1.2)
Total Protein: 6.9 g/dL (ref 6.1–8.1)

## 2020-01-31 LAB — HEMOGLOBIN A1C
Hgb A1c MFr Bld: 6.2 % of total Hgb — ABNORMAL HIGH (ref <5.7)
Mean Plasma Glucose: 131 (calc)
eAG (mmol/L): 7.3 (calc)

## 2020-02-01 ENCOUNTER — Ambulatory Visit
Admission: RE | Admit: 2020-02-01 | Discharge: 2020-02-01 | Disposition: A | Discharge status: Home / Self Care | Payer: Medicare Other | Source: Ambulatory Visit | Attending: Nurse Practitioner | Admitting: Nurse Practitioner

## 2020-02-01 ENCOUNTER — Other Ambulatory Visit: Payer: Self-pay

## 2020-02-01 DIAGNOSIS — E2839 Other primary ovarian failure: Secondary | ICD-10-CM

## 2020-02-01 DIAGNOSIS — M85832 Other specified disorders of bone density and structure, left forearm: Secondary | ICD-10-CM | POA: Diagnosis not present

## 2020-02-01 DIAGNOSIS — Z780 Asymptomatic menopausal state: Secondary | ICD-10-CM | POA: Diagnosis not present

## 2020-02-02 ENCOUNTER — Encounter: Payer: Self-pay | Admitting: *Deleted

## 2020-02-02 ENCOUNTER — Telehealth: Payer: Self-pay

## 2020-02-02 DIAGNOSIS — N3281 Overactive bladder: Secondary | ICD-10-CM

## 2020-02-02 MED ORDER — SOLIFENACIN SUCCINATE 5 MG PO TABS: 5.0000 mg | ORAL_TABLET | Freq: Every day | ORAL | 3 refills | Status: DC

## 2020-02-02 NOTE — Telephone Encounter (Signed)
Patient notified and agreed. Medication list updated.  

## 2020-02-02 NOTE — Telephone Encounter (Signed)
She can try vesicare 5 mg daily- Rx sent to pharmacy.

## 2020-02-02 NOTE — Telephone Encounter (Signed)
Message left on clinical intake voicemail:   Patient called to inform Janett Billow that medication recently prescribed (mybetriq) cost 103.00 dollars and she is unable to afford that. Patient is requesting a less expensive alternative.  Please advise

## 2020-02-06 ENCOUNTER — Encounter: Payer: Self-pay | Admitting: Internal Medicine

## 2020-02-06 ENCOUNTER — Ambulatory Visit: Payer: Medicare Other | Admitting: Internal Medicine

## 2020-02-06 ENCOUNTER — Other Ambulatory Visit: Payer: Self-pay

## 2020-02-06 VITALS — BP 130/80 | HR 94 | Temp 97.8°F | Ht 63.0 in | Wt 267.0 lb

## 2020-02-06 DIAGNOSIS — R600 Localized edema: Secondary | ICD-10-CM

## 2020-02-06 DIAGNOSIS — G4733 Obstructive sleep apnea (adult) (pediatric): Secondary | ICD-10-CM

## 2020-02-06 DIAGNOSIS — R0683 Snoring: Secondary | ICD-10-CM

## 2020-02-06 DIAGNOSIS — F5101 Primary insomnia: Secondary | ICD-10-CM

## 2020-02-06 NOTE — Progress Notes (Signed)
02/06/20-  85 yoF never smoker for sleep evaluation for c/o snoring. Medical problem list includes HTN, DM2, Hypothyroid/ Goiter, Osteoarthritis, CKD,  Breast Cancer, DOE, Hyperlipidemia, Gout,  Body weight today 267 lbs Epworth score 2 Grand daughter has told her she snores. No ENT surgery. She is aware of difficulty initiating and maintaining sleep, but denies dyspnea or somatic discomforts in bed other than nocturia x2/ night. Dozes off sometimes when quiet, but no problems driving. Admits DOE with sustained walking outside home. No cough or wheeze. Denies heart or lung problems, but admits intermittent pedal edema.  Several close relatives on CPAP.  No sleep meds, no caffeine.  Prior to Admission medications   Medication Sig Start Date End Date Taking? Authorizing Provider  acetaminophen (TYLENOL) 500 MG tablet Take 500 mg by mouth as needed.   Yes [provider]  allopurinol (ZYLOPRIM) 100 MG tablet TAKE 1 TABLET BY MOUTH EVERY DAY FOR GOUT 12/19/19  Yes Lauree Chandler, NP  amLODipine (NORVASC) 5 MG tablet TAKE 1 TABLET BY MOUTH EVERY DAY 12/19/19  Yes Lauree Chandler, NP  CALCIUM-VITAMIN D PO Take by mouth.   Yes [provider]  diclofenac sodium (VOLTAREN) 1 % GEL Apply 4 g topically 4 (four) times daily as needed. 11/26/18  Yes Medina-Vargas, Monina C, NP  glipiZIDE (GLIPIZIDE XL) 5 MG 24 hr tablet Take one tablet by mouth once daily with Breakfast 11/14/19  Yes Lauree Chandler, NP  levothyroxine (SYNTHROID) 100 MCG tablet TAKE 1 TABLET BY MOUTH EVERY DAY 30 MINUTES BEFORE BREAKFAST ON AN EMPTY STOMACH 12/19/19  Yes Lauree Chandler, NP  losartan (COZAAR) 50 MG tablet TAKE 1 TABLET(50 MG) BY MOUTH DAILY 10/20/19  Yes Lauree Chandler, NP  solifenacin (VESICARE) 5 MG tablet Take 1 tablet (5 mg total) by mouth daily. 02/02/20  Yes Lauree Chandler, NP  triamcinolone cream (KENALOG) 0.1 % Apply 1 application topically 2 (two) times daily. 10/04/18  Yes Lauree Chandler, NP  ZINC OXIDE PO Take 1 tablet by mouth daily.   Yes [provider]   ' Past Medical History:  Diagnosis Date  . Allergy   . Arthritis   . 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 Cancer ([place])   . Diabetes mellitus without complication ([place])   . GERD (gastroesophageal reflux disease)    occ  . Gout   . History of hiatal hernia   . HOH (hard of hearing)   . Hypertension   . Hypothyroidism   . Pneumonia    hx  . Thyroid disease   . Wears glasses    Past Surgical History:  Procedure Laterality Date  . BREAST BIOPSY Right 10/26/2013  . BREAST LUMPECTOMY Right 12/16/2013  . BREAST LUMPECTOMY WITH NEEDLE LOCALIZATION Right 12/16/2013   Procedure: BREAST LUMPECTOMY WITH NEEDLE LOCALIZATION;  Surgeon: Edward Jolly, MD;  Location: [place];  Service: General;  Laterality: Right;  . CATARACT EXTRACTION Left 12/30/2017  . CHOLECYSTECTOMY  1970  . COLONOSCOPY    . FINGER SURGERY     right hand-  . gallbladder sugery    . RE-EXCISION OF BREAST LUMPECTOMY Right 01/30/2014   Procedure: RE-EXCISION OF BREAST LUMPECTOMY;  Surgeon: Edward Jolly, MD;  Location: [place];  Service: General;  Laterality: Right;  . RE-EXCISION OF BREAST LUMPECTOMY Right 03/06/2014   Procedure: RE-EXCISION OF BREAST LUMPECTOMY;  Surgeon: Edward Jolly, MD;  Location: [facility];  Service: General;  Laterality: Right;  . SHOULDER ARTHROSCOPY  2011   left  . THYROID SURGERY  1970  . TOTAL SHOULDER ARTHROPLASTY Right 05/21/2016  . TOTAL SHOULDER ARTHROPLASTY Right 05/21/2016   Procedure: RIGHT TOTAL SHOULDER ARTHROPLASTY;  Surgeon: Ninetta Lights, MD;  Location: [place];  Service: Orthopedics;  Laterality: Right;  . TUBAL LIGATION     Family History  Problem Relation Age of Onset  . Diabetes Mother   . Arthritis Father   . Seizures Sister   . Pneumonia Daughter   . Cancer Sister        breast  . Breast  cancer Sister   . Multiple sclerosis Son        Deceased    Social History   Socioeconomic History  . Marital status: Widowed    Spouse name: Not on file  . Number of children: Not on file  . Years of education: Not on file  . Highest education level: Not on file  Occupational History  . Not on file  Tobacco Use  . Smoking status: Never Smoker  . Smokeless tobacco: Never Used  Substance and Sexual Activity  . Alcohol use: No    Alcohol/week: 0.0 standard drinks  . Drug use: No  . Sexual activity: Not Currently    Comment: menarche age 89,1st intercourse 85 yo-Fewer than 5 partners  Other Topics Concern  . Not on file  Social History Narrative   Diet: No      Do you drink/ eat things with caffeine? Yes      Marital status:    Widowed                           What year were you married ?  1956      Do you live in a house, apartment,assistred living, condo, trailer, etc.)?  apartment      Is it one or more stories?  one      How many persons live in your home ?  3      Do you have any pets in your home ?(please list)  No      Current or past profession:  Bank, Caregiver      Do you exercise?  No                            Type & how often:        Do you have a living will?  Yes      Do you have a DNR form?  Yes                     If not, do you want to discuss one?       Do you have signed POA?HPOA forms?                 If so, please bring to your        appointment      Social Determinants of Health   Financial Resource Strain:   . Difficulty of Paying Living Expenses:   Food Insecurity:   . Worried About Charity fundraiser in the Last Year:   . Arboriculturist in the Last Year:   Transportation Needs:   . Film/video editor (Medical):   Marland Kitchen Lack of Transportation (Non-Medical):   Physical Activity:   .  Days of Exercise per Week:   . Minutes of Exercise per Session:   Stress:   . Feeling of Stress :   Social Connections:   . Frequency of  Communication with Friends and Family:   . Frequency of Social Gatherings with Friends and Family:   . Attends Religious Services:   . Active Member of Clubs or Organizations:   . Attends Archivist Meetings:   Marland Kitchen Marital Status:   Intimate Partner Violence:   . Fear of Current or Ex-Partner:   . Emotionally Abused:   Marland Kitchen Physically Abused:   . Sexually Abused:    ROS-see HPI   + = positive Constitutional:    weight loss, night sweats, fevers, chills, +fatigue, lassitude. HEENT:    headaches, difficulty swallowing, tooth/dental problems, sore throat,       sneezing, itching, ear ache,+ nasal congestion, post nasal drip, snoring CV:    chest pain, orthopnea, PND, +swelling in lower extremities, anasarca, dizziness, palpitations Resp:   +shortness of breath with exertion or at rest.                productive cough,   non-productive cough, coughing up of blood.              change in color of mucus.  wheezing.   Skin:    rash or lesions. GI:  No-   heartburn, indigestion, abdominal pain, nausea, vomiting, diarrhea,                 change in bowel habits, loss of appetite GU: dysuria, change in color of urine, no urgency or frequency.   flank pain. MS:   joint pain, +stiffness, decreased range of motion, back pain. Neuro-     nothing unusual Psych:  change in mood or affect.  depression or anxiety.   memory loss.  OBJ- Physical Exam General- Alert, Oriented, Affect-appropriate, Distress- none acute, + obese Skin- rash-none, lesions- none, excoriation- none, + lipoma on neck Lymphadenopathy- none Head- atraumatic            Eyes- Gross vision intact, PERRLA, conjunctivae and secretions clear            Ears- Hearing, canals-normal            Nose- Clear, no-Septal dev, mucus, polyps, erosion, perforation             Throat- Mallampati II , mucosa clear , drainage- none, tonsils- atrophic Neck- flexible , trachea midline, no stridor , thyroid nl, carotid no bruit Chest -  symmetrical excursion , unlabored           Heart/CV- RRR , no murmur , no gallop  , no rub, nl s1 s2                           - JVD- none , edema+2-3, stasis changes- none, varices- none           Lung- clear to P&A, wheeze- none, cough- none , dullness-none, rub- none           Chest wall-  Abd-  Br/ Gen/ Rectal- Not done, not indicated Extrem- cyanosis- none, clubbing, none, atrophy- none, strength- nl Neuro- grossly intact to observation

## 2020-02-06 NOTE — Patient Instructions (Signed)
Order- schedule unattended home sleep test    Dx OSA  Please call us about 2 weeks after your sleep test, to see if results and recommendations are ready yet

## 2020-02-14 DIAGNOSIS — R0683 Snoring: Secondary | ICD-10-CM | POA: Insufficient documentation

## 2020-02-14 DIAGNOSIS — G4700 Insomnia, unspecified: Secondary | ICD-10-CM | POA: Insufficient documentation

## 2020-02-14 NOTE — Assessment & Plan Note (Signed)
Loud snoring with suspicion of OSA. Need to evaluate this before we treat for insomnia. Plan- sleep study

## 2020-02-14 NOTE — Assessment & Plan Note (Signed)
Difficulty initiating and maintaining sleep. Basic sleep hygiene reviewed. Plan- consider Rx after we see resultss of sleep study

## 2020-02-14 NOTE — Assessment & Plan Note (Signed)
Noted on exam. She describes it as dependent. Plan- per PCP

## 2020-02-23 ENCOUNTER — Telehealth: Payer: Self-pay | Admitting: Internal Medicine

## 2020-02-23 NOTE — Telephone Encounter (Signed)
Karla Willis is working on this one

## 2020-02-23 NOTE — Telephone Encounter (Signed)
I just tried calling the patient back and her phone just rings. I will try again in the morning per the phone note

## 2020-02-24 NOTE — Telephone Encounter (Signed)
I just spoke with Mrs. Karla Willis and she did talk with her insurance about what she would have to pay to do the home sleep study. Whoever she spoke with stated she would have to pay $10.00. she didn't think that was right. She has had a death in the family and will be leaving to go out of town. Mrs. Karla Willis stated she would call me back when she return to [facility] to schedule the home sleep study.

## 2020-03-01 ENCOUNTER — Ambulatory Visit: Payer: Medicare Other | Admitting: Nurse Practitioner

## 2020-03-01 ENCOUNTER — Other Ambulatory Visit: Payer: Self-pay

## 2020-03-07 ENCOUNTER — Other Ambulatory Visit: Payer: Self-pay

## 2020-03-07 ENCOUNTER — Encounter: Payer: Self-pay | Admitting: Nurse Practitioner

## 2020-03-07 ENCOUNTER — Ambulatory Visit (INDEPENDENT_AMBULATORY_CARE_PROVIDER_SITE_OTHER): Payer: Medicare Other | Admitting: Nurse Practitioner

## 2020-03-07 VITALS — BP 130/84 | HR 62 | Temp 96.8°F | Ht 63.0 in | Wt 261.0 lb

## 2020-03-07 DIAGNOSIS — N3281 Overactive bladder: Secondary | ICD-10-CM | POA: Diagnosis not present

## 2020-03-07 DIAGNOSIS — Z7189 Other specified counseling: Secondary | ICD-10-CM | POA: Diagnosis not present

## 2020-03-07 NOTE — Progress Notes (Signed)
Careteam: Patient Care Team: Lauree Chandler, NP as PCP - General (Geriatric Medicine) Nicholas Lose, MD as Consulting Physician (Hematology and Oncology) Specialists, [place] as Consulting Physician (Orthopedic Surgery)  PLACE OF SERVICE:  [place] Directive information Does Patient Have a Medical Advance Directive?: Yes, Type of Advance Directive: [place];Living will;Out of facility DNR (pink MOST or yellow form), Does patient want to make changes to medical advance directive?: No - Patient declined  Allergies  Allergen Reactions  . Actos [Pioglitazone] Swelling  . Hydrocodone Nausea Only and Other (See Comments)    "bloated"  . Codeine Rash  . Neurontin [Gabapentin] Palpitations and Other (See Comments)    dizziness  . Other Rash    Nuts--PECANS  . Peanut-Containing Drug Products Rash    Chief Complaint  Patient presents with  . Follow-up    1 Month Follow Up for Overactive Bladder.  . Form Completion    Most Form Completion     HPI: Patient is a 85 y.o. female for follow up on overactive bladder and MOST form.  Overactive bladder- feeling like she does have benefits from vesicare, myrbetriq was very expensive. 90 day supply was more affordable of vesicare.  Does not wish to do health and wellness due to cost.   Review of Systems:  Review of Systems  Constitutional: Negative for chills, fever and malaise/fatigue.  Genitourinary: Positive for frequency (improved).    Past Medical History:  Diagnosis Date  . Allergy   . Arthritis   . Breast cancer ([place]) 10/24/13   right upper inner- DCIS  . Cancer ([place])   . Diabetes mellitus without complication ([place])   . GERD (gastroesophageal reflux disease)    occ  . Gout   . History of hiatal hernia   . HOH (hard of hearing)   . Hypertension   . Hypothyroidism   . Pneumonia    hx  . Thyroid disease   . Wears glasses    Past Surgical History:  Procedure  Laterality Date  . BREAST BIOPSY Right 10/26/2013  . BREAST LUMPECTOMY Right 12/16/2013  . BREAST LUMPECTOMY WITH NEEDLE LOCALIZATION Right 12/16/2013   Procedure: BREAST LUMPECTOMY WITH NEEDLE LOCALIZATION;  Surgeon: Edward Jolly, MD;  Location: [facility];  Service: General;  Laterality: Right;  . CATARACT EXTRACTION Left 12/30/2017  . CHOLECYSTECTOMY  1970  . COLONOSCOPY    . FINGER SURGERY     right hand-  . gallbladder sugery    . RE-EXCISION OF BREAST LUMPECTOMY Right 01/30/2014   Procedure: RE-EXCISION OF BREAST LUMPECTOMY;  Surgeon: Edward Jolly, MD;  Location: [facility];  Service: General;  Laterality: Right;  . RE-EXCISION OF BREAST LUMPECTOMY Right 03/06/2014   Procedure: RE-EXCISION OF BREAST LUMPECTOMY;  Surgeon: Edward Jolly, MD;  Location: [facility];  Service: General;  Laterality: Right;  . SHOULDER ARTHROSCOPY  2011   left  . THYROID SURGERY  1970  . TOTAL SHOULDER ARTHROPLASTY Right 05/21/2016  . TOTAL SHOULDER ARTHROPLASTY Right 05/21/2016   Procedure: RIGHT TOTAL SHOULDER ARTHROPLASTY;  Surgeon: Ninetta Lights, MD;  Location: [facility];  Service: Orthopedics;  Laterality: Right;  . TUBAL LIGATION     Social History:   reports that she has never smoked. She has never used smokeless tobacco. She reports that she does not drink alcohol or use drugs.  Family History  Problem Relation Age of Onset  . Diabetes Mother   .  Arthritis Father   . Seizures Sister   . Pneumonia Daughter   . Cancer Sister        breast  . Breast cancer Sister   . Multiple sclerosis Son        Deceased     Medications: Patient's Medications  New Prescriptions   No medications on file  Previous Medications   ACETAMINOPHEN (TYLENOL) 500 MG TABLET    Take 500 mg by mouth as needed.   ALLOPURINOL (ZYLOPRIM) 100 MG TABLET    TAKE 1 TABLET BY MOUTH EVERY DAY FOR GOUT   AMLODIPINE (NORVASC) 5 MG TABLET    TAKE 1 TABLET BY MOUTH  EVERY DAY   CALCIUM-VITAMIN D PO    Take by mouth.   DICLOFENAC SODIUM (VOLTAREN) 1 % GEL    Apply 4 g topically 4 (four) times daily as needed.   GLIPIZIDE (GLIPIZIDE XL) 5 MG 24 HR TABLET    Take one tablet by mouth once daily with Breakfast   LEVOTHYROXINE (SYNTHROID) 100 MCG TABLET    TAKE 1 TABLET BY MOUTH EVERY DAY 30 MINUTES BEFORE BREAKFAST ON AN EMPTY STOMACH   LOSARTAN (COZAAR) 50 MG TABLET    TAKE 1 TABLET(50 MG) BY MOUTH DAILY   SOLIFENACIN (VESICARE) 5 MG TABLET    Take 1 tablet (5 mg total) by mouth daily.   TRIAMCINOLONE CREAM (KENALOG) 0.1 %    Apply 1 application topically 2 (two) times daily.   ZINC OXIDE PO    Take 1 tablet by mouth daily.  Modified Medications   No medications on file  Discontinued Medications   No medications on file    Physical Exam:  Vitals:   03/07/20 1519  BP: 130/84  Pulse: 62  Temp: (!) 96.8 F (36 C)  SpO2: 96%  Weight: 261 lb (118.4 kg)  Height: 5\' 3"  (1.6 m)   Body mass index is 46.23 kg/m. Wt Readings from Last 3 Encounters:  03/07/20 261 lb (118.4 kg)  02/06/20 267 lb (121.1 kg)  01/30/20 262 lb (118.8 kg)    Physical Exam Constitutional:      Appearance: Normal appearance.  Skin:    General: Skin is warm and dry.  Neurological:     Mental Status: She is alert and oriented to person, place, and time. Mental status is at baseline.  Psychiatric:        Mood and Affect: Mood normal.    Labs reviewed: Basic Metabolic Panel: Recent Labs    04/21/19 1852 11/02/19 0945 01/30/20 1352  NA 140 140 144  K 3.7 4.2 4.6  CL 106 107 107  CO2 25 27 31   GLUCOSE 89 99 135*  BUN 17 11 16   CREATININE 1.23* 1.08* 1.02*  CALCIUM 9.0 9.2 10.1  TSH  --  1.91  --    Liver Function Tests: Recent Labs    11/02/19 0945 01/30/20 1352  AST 17 16  ALT 12 11  BILITOT 0.3 0.4  PROT 6.6 6.9   No results for input(s): LIPASE, AMYLASE in the last 8760 hours. No results for input(s): AMMONIA in the last 8760 hours. CBC: Recent  Labs    04/21/19 1852 11/02/19 0945 01/30/20 1352  WBC 8.2 5.2 4.6  NEUTROABS 5.1 3,063 2,645  HGB 12.6 12.3 12.8  HCT 38.0 37.5 39.3  MCV 87.6 88.2 89.3  PLT 219 222 230   Lipid Panel: Recent Labs    11/02/19 0945  CHOL 162  HDL 60  LDLCALC 86  TRIG 72  CHOLHDL 2.7   TSH: Recent Labs    11/02/19 0945  TSH 1.91   A1C: Lab Results  Component Value Date   HGBA1C 6.2 (H) 01/30/2020     Assessment/Plan 1. Overactive bladder Seeing improvement on vesicare. Will continue at this time.   2. Advanced care planning/counseling discussion -MOST form completed at this time. All questions answered  Next appt: 05/31/2020 as scheduled.  Carlos American. [place], [facility] [phone]

## 2020-03-12 ENCOUNTER — Telehealth: Payer: Self-pay | Admitting: Internal Medicine

## 2020-03-12 NOTE — Telephone Encounter (Signed)
Dr. Annamaria Boots you put in a home sleep study order for Karla Willis. When I spoke with her today she stated that her PCP had given her some kind of medicine and she is not going to the bathroom as much at night and is now sleeping more.  She wants to put off doing the home sleep study to see if this makes a difference

## 2020-03-12 NOTE — Telephone Encounter (Signed)
Noted  

## 2020-04-23 ENCOUNTER — Ambulatory Visit: Payer: Medicare Other | Admitting: Hematology and Oncology

## 2020-04-30 ENCOUNTER — Other Ambulatory Visit: Payer: Self-pay | Admitting: Nurse Practitioner

## 2020-04-30 DIAGNOSIS — E119 Type 2 diabetes mellitus without complications: Secondary | ICD-10-CM

## 2020-04-30 DIAGNOSIS — Z853 Personal history of malignant neoplasm of breast: Secondary | ICD-10-CM

## 2020-04-30 NOTE — Progress Notes (Signed)
Patient Care Team: Lauree Chandler, NP as PCP - General (Geriatric Medicine) Nicholas Lose, MD as Consulting Physician (Hematology and Oncology) Specialists, [place] as Consulting Physician (Orthopedic Surgery)  DIAGNOSIS:    ICD-10-CM   1. Ductal carcinoma in situ (DCIS) of right breast  D05.11     SUMMARY OF ONCOLOGIC HISTORY: Oncology History  Breast cancer of lower-inner quadrant of right female breast ([place]) (Resolved)  10/19/2013 Initial Diagnosis   DCIS ER/PR positive low grade   12/16/2013 Surgery   Lumpectomy right breast: DCIS with calcifications low-grade 1.6 cm focally 0.1 cm to lateral margin, excision of right posterior margin continued to show DCIS that led to the third surgery for reexcision which was negative for DCIS ER 100% PR 60%   01/09/2014 - 04/2019 Anti-estrogen oral therapy   Tamoxifen 20 mg daily (patient not a candidate for radiation because of body habitus)     CHIEF COMPLIANT: Follow-up of right breast DCIS   INTERVAL HISTORY: Karla Willis is a 85 y.o. with above-mentioned history of right breast DCIS treated with lumpectomy and who completed 5 years of antiestrogen therapy with tamoxifen. Bone density scan on 02/01/20 showed osteopenia with a T-score of -2.2 at the forearm radius. She presents to the clinic today for annual follow-up.   ALLERGIES:  is allergic to actos [pioglitazone], hydrocodone, codeine, neurontin [gabapentin], other, and peanut-containing drug products.  MEDICATIONS:  Current Outpatient Medications  Medication Sig Dispense Refill  . acetaminophen (TYLENOL) 500 MG tablet Take 500 mg by mouth as needed.    Marland Kitchen allopurinol (ZYLOPRIM) 100 MG tablet TAKE 1 TABLET BY MOUTH EVERY DAY FOR GOUT 90 tablet 1  . amLODipine (NORVASC) 5 MG tablet TAKE 1 TABLET BY MOUTH EVERY DAY 90 tablet 1  . CALCIUM-VITAMIN D PO Take by mouth.    . diclofenac sodium (VOLTAREN) 1 % GEL Apply 4 g topically 4 (four) times daily as needed. 100 g  0  . glipiZIDE (GLUCOTROL XL) 5 MG 24 hr tablet TAKE 1 TABLET BY MOUTH EVERY DAY WITH BREAKFAST 90 tablet 1  . levothyroxine (SYNTHROID) 100 MCG tablet TAKE 1 TABLET BY MOUTH EVERY DAY 30 MINUTES BEFORE BREAKFAST ON AN EMPTY STOMACH 90 tablet 1  . losartan (COZAAR) 50 MG tablet TAKE 1 TABLET(50 MG) BY MOUTH DAILY 90 tablet 1  . solifenacin (VESICARE) 5 MG tablet Take 1 tablet (5 mg total) by mouth daily. 30 tablet 3  . triamcinolone cream (KENALOG) 0.1 % Apply 1 application topically 2 (two) times daily. 45 g 3  . ZINC OXIDE PO Take 1 tablet by mouth daily.     No current facility-administered medications for this visit.    PHYSICAL EXAMINATION: ECOG PERFORMANCE STATUS: 1 - Symptomatic but completely ambulatory  Vitals:   05/01/20 1352  BP: (!) 144/81  Pulse: 79  Resp: 18  Temp: 98.5 F (36.9 C)  SpO2: 98%   Filed Weights   05/01/20 1352  Weight: 266 lb 9.6 oz (120.9 kg)    BREAST: No palpable masses or nodules in either right or left breasts. No palpable axillary supraclavicular or infraclavicular adenopathy no breast tenderness or nipple discharge. (exam performed in the presence of a chaperone)  LABORATORY DATA:  I have reviewed the data as listed CMP Latest Ref Rng & Units 01/30/2020 11/02/2019 04/21/2019  Glucose 65 - 99 mg/dL 135(H) 99 89  BUN 7 - 25 mg/dL 16 11 17   Creatinine 0.60 - 0.88 mg/dL 1.02(H) 1.08(H) 1.23(H)  Sodium 135 -  146 mmol/L 144 140 140  Potassium 3.5 - 5.3 mmol/L 4.6 4.2 3.7  Chloride 98 - 110 mmol/L 107 107 106  CO2 20 - 32 mmol/L 31 27 25   Calcium 8.6 - 10.4 mg/dL 10.1 9.2 9.0  Total Protein 6.1 - 8.1 g/dL 6.9 6.6 -  Total Bilirubin 0.2 - 1.2 mg/dL 0.4 0.3 -  Alkaline Phos 33 - 130 U/L - - -  AST 10 - 35 U/L 16 17 -  ALT 6 - 29 U/L 11 12 -    Lab Results  Component Value Date   WBC 4.6 01/30/2020   HGB 12.8 01/30/2020   HCT 39.3 01/30/2020   MCV 89.3 01/30/2020   PLT 230 01/30/2020   NEUTROABS 2,645 01/30/2020    ASSESSMENT & PLAN:    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 Patient did not undergo radiation therapy he 100% PR 60% positive. She was started on tamoxifen in April 2015 completed June 2020.  Breast cancer surveillance: 1. Mammograms scheduled for 05/16/2020 2. Breast exam6/22/2021: Benign 3.  Bone density 02/01/2020: T score -2.2: Osteopenia  Osteopenia: Recommended bisphosphonate therapy with calcium and vitamin D.  She will discuss this with her primary care physician. Return to clinic on an as-needed basis   No orders of the defined types were placed in this encounter.  The patient has a good understanding of the overall plan. she agrees with it. she will call with any problems that may develop before the next visit here.  Total time spent: 20 mins including face to face time and time spent for planning, charting and coordination of care  Nicholas Lose, MD 05/01/2020  I, Cloyde Reams Dorshimer, am acting as scribe for Dr. Nicholas Lose.  I have reviewed the above documentation for accuracy and completeness, and I agree with the above.

## 2020-05-01 ENCOUNTER — Other Ambulatory Visit: Payer: Self-pay

## 2020-05-01 ENCOUNTER — Inpatient Hospital Stay: Payer: Medicare Other | Attending: Hematology and Oncology | Admitting: Hematology and Oncology

## 2020-05-01 DIAGNOSIS — Z791 Long term (current) use of non-steroidal anti-inflammatories (NSAID): Secondary | ICD-10-CM | POA: Insufficient documentation

## 2020-05-01 DIAGNOSIS — Z170 Estrogen receptor positive status [ER+]: Secondary | ICD-10-CM | POA: Diagnosis not present

## 2020-05-01 DIAGNOSIS — D0511 Intraductal carcinoma in situ of right breast: Secondary | ICD-10-CM | POA: Diagnosis not present

## 2020-05-01 DIAGNOSIS — Z7984 Long term (current) use of oral hypoglycemic drugs: Secondary | ICD-10-CM | POA: Diagnosis not present

## 2020-05-01 DIAGNOSIS — Z79899 Other long term (current) drug therapy: Secondary | ICD-10-CM | POA: Insufficient documentation

## 2020-05-01 DIAGNOSIS — Z86000 Personal history of in-situ neoplasm of breast: Secondary | ICD-10-CM | POA: Diagnosis not present

## 2020-05-01 DIAGNOSIS — M8580 Other specified disorders of bone density and structure, unspecified site: Secondary | ICD-10-CM | POA: Insufficient documentation

## 2020-05-01 NOTE — Assessment & Plan Note (Signed)
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 Patient did not undergo radiation therapy he 100% PR 60% positive. She was started on tamoxifen in April 2015 completed June 2020.  Breast cancer surveillance: 1. Mammograms scheduled for 05/16/2020 2. Breast exam6/22/2021: Benign 3.  Bone density 02/01/2020: T score -2.2: Osteopenia  Osteopenia: Recommended bisphosphonate therapy with calcium and vitamin D.  She will discuss this with her primary care physician.

## 2020-05-03 ENCOUNTER — Telehealth: Payer: Self-pay | Admitting: Hematology and Oncology

## 2020-05-03 NOTE — Telephone Encounter (Signed)
No 6/22 los, no changes made to patient schedule

## 2020-05-06 ENCOUNTER — Other Ambulatory Visit: Payer: Self-pay | Admitting: Nurse Practitioner

## 2020-05-06 DIAGNOSIS — I10 Essential (primary) hypertension: Secondary | ICD-10-CM

## 2020-05-10 ENCOUNTER — Ambulatory Visit: Payer: Medicare Other | Admitting: Internal Medicine

## 2020-05-16 ENCOUNTER — Other Ambulatory Visit: Payer: Self-pay

## 2020-05-16 ENCOUNTER — Ambulatory Visit
Admission: RE | Admit: 2020-05-16 | Discharge: 2020-05-16 | Disposition: A | Discharge status: Home / Self Care | Payer: Medicare Other | Source: Ambulatory Visit | Attending: Nurse Practitioner | Admitting: Nurse Practitioner

## 2020-05-16 DIAGNOSIS — R928 Other abnormal and inconclusive findings on diagnostic imaging of breast: Secondary | ICD-10-CM | POA: Diagnosis not present

## 2020-05-16 DIAGNOSIS — Z853 Personal history of malignant neoplasm of breast: Secondary | ICD-10-CM

## 2020-05-31 ENCOUNTER — Ambulatory Visit: Payer: Medicare Other | Admitting: Nurse Practitioner

## 2020-06-06 ENCOUNTER — Other Ambulatory Visit: Payer: Self-pay

## 2020-06-06 ENCOUNTER — Encounter: Payer: Self-pay | Admitting: Nurse Practitioner

## 2020-06-06 ENCOUNTER — Ambulatory Visit (INDEPENDENT_AMBULATORY_CARE_PROVIDER_SITE_OTHER): Payer: Medicare Other | Admitting: Nurse Practitioner

## 2020-06-06 VITALS — BP 130/78 | HR 62 | Temp 96.6°F | Ht 63.0 in | Wt 268.0 lb

## 2020-06-06 DIAGNOSIS — R198 Other specified symptoms and signs involving the digestive system and abdomen: Secondary | ICD-10-CM

## 2020-06-06 DIAGNOSIS — E1122 Type 2 diabetes mellitus with diabetic chronic kidney disease: Secondary | ICD-10-CM

## 2020-06-06 DIAGNOSIS — M1000 Idiopathic gout, unspecified site: Secondary | ICD-10-CM | POA: Diagnosis not present

## 2020-06-06 DIAGNOSIS — Z6841 Body Mass Index (BMI) 40.0 and over, adult: Secondary | ICD-10-CM

## 2020-06-06 DIAGNOSIS — R079 Chest pain, unspecified: Secondary | ICD-10-CM

## 2020-06-06 DIAGNOSIS — E034 Atrophy of thyroid (acquired): Secondary | ICD-10-CM | POA: Diagnosis not present

## 2020-06-06 DIAGNOSIS — R09A2 Foreign body sensation, throat: Secondary | ICD-10-CM

## 2020-06-06 DIAGNOSIS — N182 Chronic kidney disease, stage 2 (mild): Secondary | ICD-10-CM

## 2020-06-06 DIAGNOSIS — R0989 Other specified symptoms and signs involving the circulatory and respiratory systems: Secondary | ICD-10-CM

## 2020-06-06 DIAGNOSIS — I10 Essential (primary) hypertension: Secondary | ICD-10-CM | POA: Diagnosis not present

## 2020-06-06 MED ORDER — PANTOPRAZOLE SODIUM 40 MG PO TBEC: 40.0000 mg | DELAYED_RELEASE_TABLET | Freq: Every day | ORAL | 0 refills | Status: DC

## 2020-06-06 NOTE — Progress Notes (Signed)
Careteam: Patient Care Team: Lauree Chandler, NP as PCP - General (Geriatric Medicine) Nicholas Lose, MD as Consulting Physician (Hematology and Oncology) Specialists, [place] as Consulting Physician (Orthopedic Surgery)  PLACE OF SERVICE:  [place] Directive information Does Patient Have a Medical Advance Directive?: Yes, Type of Advance Directive: [place];Living will;Out of facility DNR (pink MOST or yellow form), Pre-existing out of facility DNR order (yellow form or pink MOST form): Yellow form placed in chart (order not valid for inpatient use);Pink MOST form placed in chart (order not valid for inpatient use), Does patient want to make changes to medical advance directive?: No - Patient declined  Allergies  Allergen Reactions  . Actos [Pioglitazone] Swelling  . Hydrocodone Nausea Only and Other (See Comments)    "bloated"  . Codeine Rash  . Neurontin [Gabapentin] Palpitations and Other (See Comments)    dizziness  . Other Rash    Nuts--PECANS  . Peanut-Containing Drug Products Rash    Chief Complaint  Patient presents with  . Medical Management of Chronic Issues    4 month follow-up  . Chest Pain    Patient c/o chest pain off/on x several months. Patient unable to recall last episode (not with in the last week)   . Hand Problem    Patient has noticed hand shakiness x long time, specifically with right hand (dominant hand), symptoms are off/on.      HPI: Patient is a 85 y.o. female for routine follow up.  Pt reports this feeling in her chest that comes and goes, goes as quick as it comes but may happens back to back. Notices mostly when she is laying in the bed. Has been going on for ~6 months. Last episodes was last week that was over and over but has not had it since. Not every day. Reports a pain 5/10. Shooting pain in the center of her chest. No shortness of breath or diaphoresis with pain.  Does not do anything  strenuous but walked from car up step at church (most activity in a long time) but did not have chest pain.    Overactive bladder- continues on vesicare.   Esophageal stricture- reports feeling of food getting stuck- happening more often.    Review of Systems:  Review of Systems  Constitutional: Negative for chills, fever and weight loss.  HENT: Negative for tinnitus.   Respiratory: Negative for cough, sputum production and shortness of breath.   Cardiovascular: Positive for chest pain and leg swelling (mild). Negative for palpitations.  Gastrointestinal: Negative for abdominal pain, constipation, diarrhea and heartburn.  Genitourinary: Negative for dysuria, frequency and urgency.  Musculoskeletal: Negative for back pain, falls, joint pain and myalgias.  Skin: Negative.   Neurological: Positive for tremors. Negative for dizziness and headaches.  Psychiatric/Behavioral: Negative for depression and memory loss. The patient does not have insomnia.     Past Medical History:  Diagnosis Date  . Allergy   . Arthritis   . Breast cancer ([place]) 10/24/13   right upper inner- DCIS  . Cancer ([place])   . Diabetes mellitus without complication ([place])   . GERD (gastroesophageal reflux disease)    occ  . Gout   . History of hiatal hernia   . HOH (hard of hearing)   . Hypertension   . Hypothyroidism   . Pneumonia    hx  . Thyroid disease   . Wears glasses    Past Surgical History:  Procedure Laterality  Date  . BREAST BIOPSY Right 10/26/2013  . BREAST LUMPECTOMY Right 12/16/2013  . BREAST LUMPECTOMY WITH NEEDLE LOCALIZATION Right 12/16/2013   Procedure: BREAST LUMPECTOMY WITH NEEDLE LOCALIZATION;  Surgeon: Edward Jolly, MD;  Location: [facility];  Service: General;  Laterality: Right;  . CATARACT EXTRACTION Left 12/30/2017  . CHOLECYSTECTOMY  1970  . COLONOSCOPY    . FINGER SURGERY     right hand-  . gallbladder sugery    . RE-EXCISION OF BREAST LUMPECTOMY Right  01/30/2014   Procedure: RE-EXCISION OF BREAST LUMPECTOMY;  Surgeon: Edward Jolly, MD;  Location: [facility];  Service: General;  Laterality: Right;  . RE-EXCISION OF BREAST LUMPECTOMY Right 03/06/2014   Procedure: RE-EXCISION OF BREAST LUMPECTOMY;  Surgeon: Edward Jolly, MD;  Location: [facility];  Service: General;  Laterality: Right;  . SHOULDER ARTHROSCOPY  2011   left  . THYROID SURGERY  1970  . TOTAL SHOULDER ARTHROPLASTY Right 05/21/2016  . TOTAL SHOULDER ARTHROPLASTY Right 05/21/2016   Procedure: RIGHT TOTAL SHOULDER ARTHROPLASTY;  Surgeon: Ninetta Lights, MD;  Location: [facility];  Service: Orthopedics;  Laterality: Right;  . TUBAL LIGATION     Social History:   reports that she has never smoked. She has never used smokeless tobacco. She reports that she does not drink alcohol and does not use drugs.  Family History  Problem Relation Age of Onset  . Diabetes Mother   . Arthritis Father   . Seizures Sister   . Pneumonia Daughter   . Cancer Sister        breast  . Breast cancer Sister   . Multiple sclerosis Son        Deceased     Medications: Patient's Medications  New Prescriptions   No medications on file  Previous Medications   ACETAMINOPHEN (TYLENOL) 500 MG TABLET    Take 500 mg by mouth as needed.   ALLOPURINOL (ZYLOPRIM) 100 MG TABLET    TAKE 1 TABLET BY MOUTH EVERY DAY FOR GOUT   AMLODIPINE (NORVASC) 5 MG TABLET    TAKE 1 TABLET BY MOUTH EVERY DAY   CALCIUM-VITAMIN D PO    Take by mouth.   DICLOFENAC SODIUM (VOLTAREN) 1 % GEL    Apply 4 g topically 4 (four) times daily as needed.   GLIPIZIDE (GLUCOTROL XL) 5 MG 24 HR TABLET    TAKE 1 TABLET BY MOUTH EVERY DAY WITH BREAKFAST   LEVOTHYROXINE (SYNTHROID) 100 MCG TABLET    TAKE 1 TABLET BY MOUTH EVERY DAY 30 MINUTES BEFORE BREAKFAST ON AN EMPTY STOMACH   LOSARTAN (COZAAR) 50 MG TABLET    TAKE 1 TABLET(50 MG) BY MOUTH DAILY   SOLIFENACIN (VESICARE) 5 MG TABLET    Take 1 tablet (5  mg total) by mouth daily.   TRIAMCINOLONE CREAM (KENALOG) 0.1 %    Apply 1 application topically 2 (two) times daily.   ZINC OXIDE PO    Take 1 tablet by mouth daily.  Modified Medications   No medications on file  Discontinued Medications   No medications on file    Physical Exam:  Vitals:   06/06/20 1430  BP: (!) 130/78  Pulse: 62  Temp: (!) 96.6 F (35.9 C)  TempSrc: Temporal  SpO2: 99%  Weight: (!) 268 lb (121.6 kg)  Height: 5\' 3"  (1.6 m)   Body mass index is 47.47 kg/m. Wt Readings from Last 3 Encounters:  06/06/20 (!) 268 lb (121.6 kg)  05/01/20 266 lb 9.6 oz (120.9 kg)  03/07/20 261 lb (118.4 kg)    Physical Exam Constitutional:      General: She is not in acute distress.    Appearance: She is well-developed. She is not diaphoretic.  HENT:     Head: Normocephalic and atraumatic.     Mouth/Throat:     Pharynx: No oropharyngeal exudate.  Eyes:     Conjunctiva/sclera: Conjunctivae normal.     Pupils: Pupils are equal, round, and reactive to light.  Cardiovascular:     Rate and Rhythm: Normal rate and regular rhythm.     Heart sounds: Normal heart sounds.  Pulmonary:     Effort: Pulmonary effort is normal.     Breath sounds: Normal breath sounds.  Abdominal:     General: Bowel sounds are normal.     Palpations: Abdomen is soft.  Musculoskeletal:        General: No tenderness.     Cervical back: Normal range of motion and neck supple.  Skin:    General: Skin is warm and dry.  Neurological:     Mental Status: She is alert and oriented to person, place, and time.  Psychiatric:        Mood and Affect: Mood normal.        Behavior: Behavior normal.     Labs reviewed: Basic Metabolic Panel: Recent Labs    11/02/19 0945 01/30/20 1352  NA 140 144  K 4.2 4.6  CL 107 107  CO2 27 31  GLUCOSE 99 135*  BUN 11 16  CREATININE 1.08* 1.02*  CALCIUM 9.2 10.1  TSH 1.91  --    Liver Function Tests: Recent Labs    11/02/19 0945 01/30/20 1352  AST 17  16  ALT 12 11  BILITOT 0.3 0.4  PROT 6.6 6.9   No results for input(s): LIPASE, AMYLASE in the last 8760 hours. No results for input(s): AMMONIA in the last 8760 hours. CBC: Recent Labs    11/02/19 0945 01/30/20 1352  WBC 5.2 4.6  NEUTROABS 3,063 2,645  HGB 12.3 12.8  HCT 37.5 39.3  MCV 88.2 89.3  PLT 222 230   Lipid Panel: Recent Labs    11/02/19 0945  CHOL 162  HDL 60  LDLCALC 86  TRIG 72  CHOLHDL 2.7   TSH: Recent Labs    11/02/19 0945  TSH 1.91   A1C: Lab Results  Component Value Date   HGBA1C 6.2 (H) 01/30/2020     Assessment/Plan 1. Class 3 severe obesity due to excess calories with serious comorbidity and body mass index (BMI) of 45.0 to 49.9 in adult [facility]) Discussed morbid obesity at length today and risk due to this. Encouraged weight loss through diet modification and increase in physical activity, she is very debility due to lack of activity.   2. Idiopathic gout, unspecified chronicity, unspecified site -no recent flares. Continues on allopurinol 100 mg daiy  3. Hypothyroidism due to acquired atrophy of thyroid -continues on synthyroid 100 mcg.  - TSH  4. Type 2 diabetes mellitus with stage 2 chronic kidney disease, without long-term current use of insulin (HCC) -Encouraged dietary compliance, routine foot care/monitoring and to keep up with diabetic eye exams through ophthalmology  -continues on glipizide, no hypoglycemia noted - Hemoglobin A1c  5. Essential hypertension, benign Controlled on norvasc 5 mg and losartan  - CBC with Differential/Platelet - COMPLETE METABOLIC PANEL WITH GFR - EKG 12-Lead  6. Globus sensation -reports she was sent to  GI for this in the past and they did endoscopy but did not find stricture. Does not wish to go back at this time. - pantoprazole (PROTONIX) 40 MG tablet; Take 1 tablet (40 mg total) by mouth daily.  Dispense: 90 tablet; Refill: 0  7. Chest pain, unspecified type On and off- this does not appear  to be new when looking back at her record. She is very sedentary but reports when she did have to walk into church she got very short of breath but no chest pain at that time. - EKG 12-Lead-consistent with previous, SR with rate 71, nonspecific T wave abnormality.   Next appt: 4 months follow up [place]. [place], [facility] [phone]

## 2020-06-07 LAB — COMPLETE METABOLIC PANEL WITH GFR
AG Ratio: 1.4 (calc) (ref 1.0–2.5)
ALT: 10 U/L (ref 6–29)
AST: 16 U/L (ref 10–35)
Albumin: 4 g/dL (ref 3.6–5.1)
Alkaline phosphatase (APISO): 73 U/L (ref 37–153)
BUN/Creatinine Ratio: 16 (calc) (ref 6–22)
BUN: 19 mg/dL (ref 7–25)
CO2: 26 mmol/L (ref 20–32)
Calcium: 9.9 mg/dL (ref 8.6–10.4)
Chloride: 108 mmol/L (ref 98–110)
Creat: 1.18 mg/dL — ABNORMAL HIGH (ref 0.60–0.88)
GFR, Est African American: 50 mL/min/{1.73_m2} — ABNORMAL LOW (ref >=60)
GFR, Est Non African American: 43 mL/min/{1.73_m2} — ABNORMAL LOW (ref >=60)
Globulin: 2.8 g/dL (calc) (ref 1.9–3.7)
Glucose, Bld: 97 mg/dL (ref 65–139)
Potassium: 5 mmol/L (ref 3.5–5.3)
Sodium: 143 mmol/L (ref 135–146)
Total Bilirubin: 0.5 mg/dL (ref 0.2–1.2)
Total Protein: 6.8 g/dL (ref 6.1–8.1)

## 2020-06-07 LAB — CBC WITH DIFFERENTIAL/PLATELET
Absolute Monocytes: 599 cells/uL (ref 200–950)
Basophils Absolute: 39 cells/uL (ref 0–200)
Basophils Relative: 0.7 %
Eosinophils Absolute: 252 cells/uL (ref 15–500)
Eosinophils Relative: 4.5 %
HCT: 38.9 % (ref 35.0–45.0)
Hemoglobin: 12.7 g/dL (ref 11.7–15.5)
Lymphs Abs: 1758 cells/uL (ref 850–3900)
MCH: 29.4 pg (ref 27.0–33.0)
MCHC: 32.6 g/dL (ref 32.0–36.0)
MCV: 90 fL (ref 80.0–100.0)
MPV: 11.4 fL (ref 7.5–12.5)
Monocytes Relative: 10.7 %
Neutro Abs: 2951 cells/uL (ref 1500–7800)
Neutrophils Relative %: 52.7 %
Platelets: 196 10*3/uL (ref 140–400)
RBC: 4.32 10*6/uL (ref 3.80–5.10)
RDW: 13.8 % (ref 11.0–15.0)
Total Lymphocyte: 31.4 %
WBC: 5.6 10*3/uL (ref 3.8–10.8)

## 2020-06-07 LAB — HEMOGLOBIN A1C
Hgb A1c MFr Bld: 5.9 % of total Hgb — ABNORMAL HIGH (ref <5.7)
Mean Plasma Glucose: 123 (calc)
eAG (mmol/L): 6.8 (calc)

## 2020-06-07 LAB — TSH: TSH: 0.54 mIU/L (ref 0.40–4.50)

## 2020-06-17 ENCOUNTER — Other Ambulatory Visit: Payer: Self-pay | Admitting: Nurse Practitioner

## 2020-06-27 ENCOUNTER — Ambulatory Visit: Payer: Medicare Other | Admitting: Internal Medicine

## 2020-06-30 ENCOUNTER — Other Ambulatory Visit: Payer: Self-pay | Admitting: Nurse Practitioner

## 2020-06-30 DIAGNOSIS — N3281 Overactive bladder: Secondary | ICD-10-CM

## 2020-07-19 ENCOUNTER — Other Ambulatory Visit: Payer: Self-pay | Admitting: Nurse Practitioner

## 2020-07-19 DIAGNOSIS — L309 Dermatitis, unspecified: Secondary | ICD-10-CM

## 2020-08-09 ENCOUNTER — Other Ambulatory Visit: Payer: Self-pay | Admitting: Nurse Practitioner

## 2020-08-09 DIAGNOSIS — I10 Essential (primary) hypertension: Secondary | ICD-10-CM

## 2020-08-31 ENCOUNTER — Other Ambulatory Visit: Payer: Self-pay | Admitting: Nurse Practitioner

## 2020-08-31 DIAGNOSIS — R198 Other specified symptoms and signs involving the digestive system and abdomen: Secondary | ICD-10-CM

## 2020-08-31 DIAGNOSIS — R0989 Other specified symptoms and signs involving the circulatory and respiratory systems: Secondary | ICD-10-CM

## 2020-09-02 ENCOUNTER — Other Ambulatory Visit: Payer: Self-pay | Admitting: Nurse Practitioner

## 2020-09-02 DIAGNOSIS — R0989 Other specified symptoms and signs involving the circulatory and respiratory systems: Secondary | ICD-10-CM

## 2020-09-02 DIAGNOSIS — R198 Other specified symptoms and signs involving the digestive system and abdomen: Secondary | ICD-10-CM

## 2020-10-01 DIAGNOSIS — H5213 Myopia, bilateral: Secondary | ICD-10-CM | POA: Diagnosis not present

## 2020-10-01 DIAGNOSIS — Z961 Presence of intraocular lens: Secondary | ICD-10-CM | POA: Diagnosis not present

## 2020-10-01 DIAGNOSIS — H40013 Open angle with borderline findings, low risk, bilateral: Secondary | ICD-10-CM | POA: Diagnosis not present

## 2020-10-01 DIAGNOSIS — H524 Presbyopia: Secondary | ICD-10-CM | POA: Diagnosis not present

## 2020-10-01 DIAGNOSIS — E119 Type 2 diabetes mellitus without complications: Secondary | ICD-10-CM | POA: Diagnosis not present

## 2020-10-08 ENCOUNTER — Encounter: Payer: Self-pay | Admitting: Nurse Practitioner

## 2020-10-08 ENCOUNTER — Other Ambulatory Visit: Payer: Self-pay

## 2020-10-08 ENCOUNTER — Ambulatory Visit (INDEPENDENT_AMBULATORY_CARE_PROVIDER_SITE_OTHER): Payer: Medicare Other | Admitting: Nurse Practitioner

## 2020-10-08 VITALS — BP 130/78 | HR 98 | Temp 96.9°F | Ht 63.0 in | Wt 274.0 lb

## 2020-10-08 DIAGNOSIS — M159 Polyosteoarthritis, unspecified: Secondary | ICD-10-CM

## 2020-10-08 DIAGNOSIS — E785 Hyperlipidemia, unspecified: Secondary | ICD-10-CM

## 2020-10-08 DIAGNOSIS — Z23 Encounter for immunization: Secondary | ICD-10-CM

## 2020-10-08 DIAGNOSIS — N182 Chronic kidney disease, stage 2 (mild): Secondary | ICD-10-CM

## 2020-10-08 DIAGNOSIS — E034 Atrophy of thyroid (acquired): Secondary | ICD-10-CM | POA: Diagnosis not present

## 2020-10-08 DIAGNOSIS — D508 Other iron deficiency anemias: Secondary | ICD-10-CM

## 2020-10-08 DIAGNOSIS — Z853 Personal history of malignant neoplasm of breast: Secondary | ICD-10-CM

## 2020-10-08 DIAGNOSIS — I10 Essential (primary) hypertension: Secondary | ICD-10-CM

## 2020-10-08 DIAGNOSIS — R198 Other specified symptoms and signs involving the digestive system and abdomen: Secondary | ICD-10-CM | POA: Diagnosis not present

## 2020-10-08 DIAGNOSIS — N3281 Overactive bladder: Secondary | ICD-10-CM

## 2020-10-08 DIAGNOSIS — E1122 Type 2 diabetes mellitus with diabetic chronic kidney disease: Secondary | ICD-10-CM

## 2020-10-08 DIAGNOSIS — Z6841 Body Mass Index (BMI) 40.0 and over, adult: Secondary | ICD-10-CM

## 2020-10-08 DIAGNOSIS — M8949 Other hypertrophic osteoarthropathy, multiple sites: Secondary | ICD-10-CM

## 2020-10-08 DIAGNOSIS — M1000 Idiopathic gout, unspecified site: Secondary | ICD-10-CM | POA: Diagnosis not present

## 2020-10-08 DIAGNOSIS — R0989 Other specified symptoms and signs involving the circulatory and respiratory systems: Secondary | ICD-10-CM

## 2020-10-08 NOTE — Progress Notes (Signed)
Careteam: Patient Care Team: Lauree Chandler, NP as PCP - General (Geriatric Medicine) Nicholas Lose, MD as Consulting Physician (Hematology and Oncology) [facility]ists, [place] as Consulting Physician (Orthopedic Surgery)  PLACE OF SERVICE:  [facility] Directive information Does Patient Have a Medical Advance Directive?: Yes, Type of Advance Directive: [place];Out of facility DNR (pink MOST or yellow form), Pre-existing out of facility DNR order (yellow form or pink MOST form): Yellow form placed in chart (order not valid for inpatient use);Pink MOST form placed in chart (order not valid for inpatient use), Does patient want to make changes to medical advance directive?: No - Patient declined  Allergies  Allergen Reactions  . Actos [Pioglitazone] Swelling  . Hydrocodone Nausea Only and Other (See Comments)    "bloated"  . Codeine Rash  . Neurontin [Gabapentin] Palpitations and Other (See Comments)    dizziness  . Other Rash    Nuts--PECANS  . Peanut-Containing Drug Products Rash    Chief Complaint  Patient presents with  . Medical Management of Chronic Issues    4 month follow-up. Discuss need for eye exam. Flu vaccine today     HPI: Patient is a 85 y.o. female for routine follow up  Ate well over thanksgiving.  Ongoing feeling of food getting stuck- not happening all the day.  Starting protonix but not seeing any benefit with this medication  TSH stable in July   Overactive bladder- continues on vesicare  DM- does not check blood sugar. No numbness or tingling to legs. Denies symptoms of hypoglycemia  Reports some changes in vision, went to eye doctor. No diabetic retinopathy per pt. Saw Dr Katy Fitch last week.   No ongoing chest pains.   Gout- no recent flares  Review of Systems:  Review of Systems  Constitutional: Negative for chills, fever and weight loss.  HENT: Negative for tinnitus.   Respiratory: Negative  for cough, sputum production and shortness of breath.   Cardiovascular: Negative for chest pain, palpitations and leg swelling.  Gastrointestinal: Negative for abdominal pain, constipation, diarrhea and heartburn.  Genitourinary: Negative for dysuria, frequency and urgency.  Musculoskeletal: Negative for back pain, falls, joint pain and myalgias.  Skin: Negative.   Neurological: Negative for dizziness and headaches.  Psychiatric/Behavioral: Negative for depression and memory loss. The patient does not have insomnia.     Past Medical History:  Diagnosis Date  . Allergy   . Arthritis   . 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 Cancer ([place])   . Diabetes mellitus without complication ([place])   . GERD (gastroesophageal reflux disease)    occ  . Gout   . History of hiatal hernia   . HOH (hard of hearing)   . Hypertension   . Hypothyroidism   . Pneumonia    hx  . Thyroid disease   . Wears glasses    Past Surgical History:  Procedure Laterality Date  . BREAST BIOPSY Right 10/26/2013  . BREAST LUMPECTOMY Right 12/16/2013  . BREAST LUMPECTOMY WITH NEEDLE LOCALIZATION Right 12/16/2013   Procedure: BREAST LUMPECTOMY WITH NEEDLE LOCALIZATION;  Surgeon: Edward Jolly, MD;  Location: [facility];  Service: General;  Laterality: Right;  . CATARACT EXTRACTION Left 12/30/2017  . CHOLECYSTECTOMY  1970  . COLONOSCOPY    . FINGER SURGERY     right hand-  . gallbladder sugery    . RE-EXCISION OF BREAST LUMPECTOMY Right 01/30/2014   Procedure:  RE-EXCISION OF BREAST LUMPECTOMY;  Surgeon: Edward Jolly, MD;  Location: [facility];  Service: General;  Laterality: Right;  . RE-EXCISION OF BREAST LUMPECTOMY Right 03/06/2014   Procedure: RE-EXCISION OF BREAST LUMPECTOMY;  Surgeon: Edward Jolly, MD;  Location: [place] [facility];  Service: General;  Laterality: Right;  . SHOULDER ARTHROSCOPY  2011   left  . THYROID SURGERY  1970  .  TOTAL SHOULDER ARTHROPLASTY Right 05/21/2016  . TOTAL SHOULDER ARTHROPLASTY Right 05/21/2016   Procedure: RIGHT TOTAL SHOULDER ARTHROPLASTY;  Surgeon: Ninetta Lights, MD;  Location: [place] [facility];  Service: Orthopedics;  Laterality: Right;  . TUBAL LIGATION     Social History:   reports that she has never smoked. She has never used smokeless tobacco. She reports that she does not drink alcohol and does not use drugs.  Family History  Problem Relation Age of Onset  . Diabetes Mother   . Arthritis Father   . Seizures Sister   . Pneumonia Daughter   . Cancer Sister        breast  . Breast cancer Sister   . Multiple sclerosis Son        Deceased     Medications: Patient's Medications  New Prescriptions   No medications on file  Previous Medications   ACETAMINOPHEN (TYLENOL) 500 MG TABLET    Take 500 mg by mouth as needed.   ALLOPURINOL (ZYLOPRIM) 100 MG TABLET    TAKE 1 TABLET BY MOUTH EVERY DAY FOR GOUT   AMLODIPINE (NORVASC) 5 MG TABLET    TAKE 1 TABLET BY MOUTH EVERY DAY   CALCIUM-VITAMIN D PO    Take by mouth.   DICLOFENAC SODIUM (VOLTAREN) 1 % GEL    Apply 4 g topically 4 (four) times daily as needed.   GLIPIZIDE (GLUCOTROL XL) 5 MG 24 HR TABLET    TAKE 1 TABLET BY MOUTH EVERY DAY WITH BREAKFAST   LEVOTHYROXINE (SYNTHROID) 100 MCG TABLET    TAKE 1 TABLET BY MOUTH EVERY DAY 30 MINUTES BEFORE BREAKFAST ON AN EMPTY STOMACH   LOSARTAN (COZAAR) 50 MG TABLET    TAKE 1 TABLET(50 MG) BY MOUTH DAILY   PANTOPRAZOLE (PROTONIX) 40 MG TABLET    TAKE 1 TABLET(40 MG) BY MOUTH DAILY   SOLIFENACIN (VESICARE) 5 MG TABLET    TAKE 1 TABLET(5 MG) BY MOUTH DAILY   TRIAMCINOLONE CREAM (KENALOG) 0.1 %    APPLY EXTERNALLY TO THE AFFECTED AREA TWICE DAILY   ZINC OXIDE PO    Take 1 tablet by mouth daily.  Modified Medications   No medications on file  Discontinued Medications   No medications on file    Physical Exam:  Vitals:   10/08/20 1311  BP: 130/78  Pulse: 98  Temp: (!) 96.9 F (36.1 C)    TempSrc: Temporal  SpO2: 97%  Weight: 274 lb (124.3 kg)  Height: '5\' 3"'  (1.6 m)   Body mass index is 48.54 kg/m. Wt Readings from Last 3 Encounters:  10/08/20 274 lb (124.3 kg)  06/06/20 (!) 268 lb (121.6 kg)  05/01/20 266 lb 9.6 oz (120.9 kg)    Physical Exam Constitutional:      General: She is not in acute distress.    Appearance: She is well-developed. She is not diaphoretic.  HENT:     Head: Normocephalic and atraumatic.     Mouth/Throat:     Pharynx: No oropharyngeal exudate.  Eyes:     Conjunctiva/sclera: Conjunctivae normal.  Pupils: Pupils are equal, round, and reactive to light.  Cardiovascular:     Rate and Rhythm: Normal rate and regular rhythm.     Heart sounds: Normal heart sounds.  Pulmonary:     Effort: Pulmonary effort is normal.     Breath sounds: Normal breath sounds.  Abdominal:     General: Abdomen is protuberant. Bowel sounds are normal.     Palpations: Abdomen is soft.  Musculoskeletal:        General: No tenderness.     Cervical back: Normal range of motion and neck supple.  Skin:    General: Skin is warm and dry.  Neurological:     Mental Status: She is alert and oriented to person, place, and time.  Psychiatric:        Mood and Affect: Mood normal.        Behavior: Behavior normal.     Labs reviewed: Basic Metabolic Panel: Recent Labs    11/02/19 0945 01/30/20 1352 06/06/20 1512  NA 140 144 143  K 4.2 4.6 5.0  CL 107 107 108  CO2 '27 31 26  ' GLUCOSE 99 135* 97  BUN '11 16 19  ' CREATININE 1.08* 1.02* 1.18*  CALCIUM 9.2 10.1 9.9  TSH 1.91  --  0.54   Liver Function Tests: Recent Labs    11/02/19 0945 01/30/20 1352 06/06/20 1512  AST '17 16 16  ' ALT '12 11 10  ' BILITOT 0.3 0.4 0.5  PROT 6.6 6.9 6.8   No results for input(s): LIPASE, AMYLASE in the last 8760 hours. No results for input(s): AMMONIA in the last 8760 hours. CBC: Recent Labs    11/02/19 0945 01/30/20 1352 06/06/20 1512  WBC 5.2 4.6 5.6  NEUTROABS 3,063  2,645 2,951  HGB 12.3 12.8 12.7  HCT 37.5 39.3 38.9  MCV 88.2 89.3 90.0  PLT 222 230 196   Lipid Panel: Recent Labs    11/02/19 0945  CHOL 162  HDL 60  LDLCALC 86  TRIG 72  CHOLHDL 2.7   TSH: Recent Labs    11/02/19 0945 06/06/20 1512  TSH 1.91 0.54   A1C: Lab Results  Component Value Date   HGBA1C 5.9 (H) 06/06/2020     Assessment/Plan 1. Need for influenza vaccination - Flu Vaccine QUAD High Dose(Fluad)  2. Idiopathic gout, unspecified chronicity, unspecified site -no recent flares, continues on allopurinol 100 mg daily - Uric Acid; Future  3. Class 3 severe obesity due to excess calories with serious comorbidity and body mass index (BMI) of 45.0 to 49.9 in adult [facility]) -has gained weight since last visit. Encouraged portion control, limiting and avoiding sweets. Limiting carbohydrates   4. Hypothyroidism due to acquired atrophy of thyroid TSH at goal on synthroid 100 mcg daily   5. Globus sensation Ongoing, but doing better with smaller bits and chewing thoroughly before swallowing. Increase liquids before and after bites. Does not wish for GI follow up.   6. Essential hypertension, benign Stable on norvasc and losartan. - CMP with eGFR([facility]); Future - CBC with Differential/Platelet; Future  7. Type 2 diabetes mellitus with stage 2 chronic kidney disease, without long-term current use of insulin (H[facility]) -continues on glipizide without hypoglycemia noted.  -educated and encouraged dietary modifications.  -Encouraged routine foot care/monitoring and to keep up with diabetic eye exams through ophthalmology , - Hemoglobin A1c; Future  8. Overactive bladder Stable on vesicare  9. Hyperlipidemia LDL goal <70 - Lipid panel; Future - CMP with eGFR([facility]); Future  10. History  of breast cancer -followed by oncology, up to date on mamogram  11. Primary osteoarthritis involving multiple joints -ongoing encouraged weight loss, strength training. Uses tylenol  OTC as needed   12. Other iron deficiency anemia Will follow up CBC  Next appt: 4 months Ransom Nickson K. [place], [facility] [phone]

## 2020-10-08 NOTE — Patient Instructions (Addendum)
STOP protonix since you are not having benefit with this.   To make LAB appt this week for fasting labs.   Increase physical activity- chair exercises are good.

## 2020-10-12 ENCOUNTER — Other Ambulatory Visit: Payer: Self-pay

## 2020-10-12 ENCOUNTER — Other Ambulatory Visit: Payer: Medicare Other

## 2020-10-12 DIAGNOSIS — I10 Essential (primary) hypertension: Secondary | ICD-10-CM | POA: Diagnosis not present

## 2020-10-12 DIAGNOSIS — E1122 Type 2 diabetes mellitus with diabetic chronic kidney disease: Secondary | ICD-10-CM

## 2020-10-12 DIAGNOSIS — M1000 Idiopathic gout, unspecified site: Secondary | ICD-10-CM | POA: Diagnosis not present

## 2020-10-12 DIAGNOSIS — N182 Chronic kidney disease, stage 2 (mild): Secondary | ICD-10-CM | POA: Diagnosis not present

## 2020-10-12 DIAGNOSIS — E785 Hyperlipidemia, unspecified: Secondary | ICD-10-CM

## 2020-10-13 LAB — COMPLETE METABOLIC PANEL WITH GFR
AG Ratio: 1.2 (calc) (ref 1.0–2.5)
ALT: 13 U/L (ref 6–29)
AST: 15 U/L (ref 10–35)
Albumin: 3.8 g/dL (ref 3.6–5.1)
Alkaline phosphatase (APISO): 74 U/L (ref 37–153)
BUN/Creatinine Ratio: 17 (calc) (ref 6–22)
BUN: 22 mg/dL (ref 7–25)
CO2: 25 mmol/L (ref 20–32)
Calcium: 9.8 mg/dL (ref 8.6–10.4)
Chloride: 107 mmol/L (ref 98–110)
Creat: 1.29 mg/dL — ABNORMAL HIGH (ref 0.60–0.88)
GFR, Est African American: 45 mL/min/{1.73_m2} — ABNORMAL LOW (ref >=60)
GFR, Est Non African American: 39 mL/min/{1.73_m2} — ABNORMAL LOW (ref >=60)
Globulin: 3.2 g/dL (calc) (ref 1.9–3.7)
Glucose, Bld: 104 mg/dL — ABNORMAL HIGH (ref 65–99)
Potassium: 4.4 mmol/L (ref 3.5–5.3)
Sodium: 141 mmol/L (ref 135–146)
Total Bilirubin: 0.3 mg/dL (ref 0.2–1.2)
Total Protein: 7 g/dL (ref 6.1–8.1)

## 2020-10-13 LAB — CBC WITH DIFFERENTIAL/PLATELET
Absolute Monocytes: 621 cells/uL (ref 200–950)
Basophils Absolute: 38 cells/uL (ref 0–200)
Basophils Relative: 0.6 %
Eosinophils Absolute: 198 cells/uL (ref 15–500)
Eosinophils Relative: 3.1 %
HCT: 39.3 % (ref 35.0–45.0)
Hemoglobin: 12.9 g/dL (ref 11.7–15.5)
Lymphs Abs: 1626 cells/uL (ref 850–3900)
MCH: 29.3 pg (ref 27.0–33.0)
MCHC: 32.8 g/dL (ref 32.0–36.0)
MCV: 89.1 fL (ref 80.0–100.0)
MPV: 11.4 fL (ref 7.5–12.5)
Monocytes Relative: 9.7 %
Neutro Abs: 3917 cells/uL (ref 1500–7800)
Neutrophils Relative %: 61.2 %
Platelets: 207 10*3/uL (ref 140–400)
RBC: 4.41 10*6/uL (ref 3.80–5.10)
RDW: 14.2 % (ref 11.0–15.0)
Total Lymphocyte: 25.4 %
WBC: 6.4 10*3/uL (ref 3.8–10.8)

## 2020-10-13 LAB — LIPID PANEL
Cholesterol: 182 mg/dL (ref <200)
HDL: 55 mg/dL (ref >=50)
LDL Cholesterol (Calc): 105 mg/dL (calc) — ABNORMAL HIGH
Non-HDL Cholesterol (Calc): 127 mg/dL (calc) (ref <130)
Total CHOL/HDL Ratio: 3.3 (calc) (ref <5.0)
Triglycerides: 119 mg/dL (ref <150)

## 2020-10-13 LAB — URIC ACID: Uric Acid, Serum: 4.8 mg/dL (ref 2.5–7.0)

## 2020-10-13 LAB — HEMOGLOBIN A1C
Hgb A1c MFr Bld: 6.1 % of total Hgb — ABNORMAL HIGH (ref <5.7)
Mean Plasma Glucose: 128 (calc)
eAG (mmol/L): 7.1 (calc)

## 2020-10-16 ENCOUNTER — Other Ambulatory Visit: Payer: Self-pay

## 2020-10-16 DIAGNOSIS — E785 Hyperlipidemia, unspecified: Secondary | ICD-10-CM

## 2020-10-16 MED ORDER — ATORVASTATIN CALCIUM 10 MG PO TABS: 10.0000 mg | ORAL_TABLET | Freq: Every day | ORAL | 1 refills | Status: DC

## 2020-10-28 ENCOUNTER — Other Ambulatory Visit: Payer: Self-pay | Admitting: Nurse Practitioner

## 2020-10-28 DIAGNOSIS — N3281 Overactive bladder: Secondary | ICD-10-CM

## 2020-10-29 ENCOUNTER — Other Ambulatory Visit: Payer: Self-pay | Admitting: Nurse Practitioner

## 2020-10-29 DIAGNOSIS — E119 Type 2 diabetes mellitus without complications: Secondary | ICD-10-CM

## 2020-10-31 ENCOUNTER — Telehealth: Payer: Self-pay | Admitting: *Deleted

## 2020-10-31 NOTE — Telephone Encounter (Signed)
Patient called and stated that the Solifenacin 5mg  is too expensive and requesting the medication to be changed to something different. Stated that is is going to cost her $62.00 and she cannot afford this.  Please Advise.  (Pended and sent to [facility] due to Janett Billow out of office)

## 2020-10-31 NOTE — Telephone Encounter (Signed)
Patient stated that she is going to hold off on a referral for now.

## 2020-10-31 NOTE — Telephone Encounter (Signed)
Other medication used for Over active bladder have side effects which are considered to be high risk  due to your age.Tends to cause dryness of the mouth,eyes causes constipation and high risk for falls.Recommend referral to Urology for evaluation.

## 2020-11-19 ENCOUNTER — Other Ambulatory Visit: Payer: Self-pay

## 2020-11-19 ENCOUNTER — Telehealth (INDEPENDENT_AMBULATORY_CARE_PROVIDER_SITE_OTHER): Payer: Medicare Other | Admitting: Family

## 2020-11-19 ENCOUNTER — Encounter: Payer: Self-pay | Admitting: Family

## 2020-11-19 DIAGNOSIS — R509 Fever, unspecified: Secondary | ICD-10-CM

## 2020-11-19 DIAGNOSIS — R058 Other specified cough: Secondary | ICD-10-CM

## 2020-11-19 DIAGNOSIS — Z20822 Contact with and (suspected) exposure to covid-19: Secondary | ICD-10-CM | POA: Diagnosis not present

## 2020-11-19 MED ORDER — VITAMIN D 125 MCG (5000 UT) PO CAPS: 5000.0000 [IU] | ORAL_CAPSULE | Freq: Every day | ORAL | 0 refills | Status: AC

## 2020-11-19 MED ORDER — VITAMIN C-ROSE HIPS 500 MG PO TABS
500.0000 mg | ORAL_TABLET | Freq: Every day | ORAL | 0 refills | Status: AC
Start: 2020-11-19 — End: 2020-12-03

## 2020-11-19 NOTE — Patient Instructions (Signed)
- take vitamin D 5000 units daily by mouth  x 14 days  - Vitamin C 500 mg tablet twice daily x 14 days  - Zinc 50 mg tablet one by mouth daily x 14 days - Mucinex as needed for cough  - Increase your water intake to at least 6-8 glasses daily  - continue with self Quarantine,social distancing,hand hygiene and and facial masking per CDC guideline below COVID-19: What to Do if You Are Sick If you have a fever, cough or other symptoms, you might have COVID-19. Most people have mild illness and are able to recover at home. If you are sick:  Keep track of your symptoms.  If you have an emergency warning sign (including trouble breathing), call 911. Steps to help prevent the spread of COVID-19 if you are sick If you are sick with COVID-19 or think you might have COVID-19, follow the steps below to care for yourself and to help protect other people in your home and community. Stay home except to get medical care  Stay home. Most people with COVID-19 have mild illness and can recover at home without medical care. Do not leave your home, except to get medical care. Do not visit public areas.  Take care of yourself. Get rest and stay hydrated. Take over-the-counter medicines, such as acetaminophen, to help you feel better.  Stay in touch with your doctor. Call before you get medical care. Be sure to get care if you have trouble breathing, or have any other emergency warning signs, or if you think it is an emergency.  Avoid public transportation, ride-sharing, or taxis. Separate yourself from other people As much as possible, stay in a specific room and away from other people and pets in your home. If possible, you should use a separate bathroom. If you need to be around other people or animals in or outside of the home, wear a mask. Tell your close contactsthat they may have been exposed to COVID-19. An infected person can spread COVID-19 starting 48 hours (or 2 days) before the person has any  symptoms or tests positive. By letting your close contacts know they may have been exposed to COVID-19, you are helping to protect everyone.  Additional guidance is available for those living in close quarters and shared housing.  See COVID-19 and Animals if you have questions about pets.  If you are diagnosed with COVID-19, someone from the health department may call you. Answer the call to slow the spread. Monitor your symptoms  Symptoms of COVID-19 include fever, cough, or other symptoms.  Follow care instructions from your healthcare provider and local health department. Your local health authorities may give instructions on checking your symptoms and reporting information. When to seek emergency medical attention Look for emergency warning signs* for COVID-19. If someone is showing any of these signs, seek emergency medical care immediately:  Trouble breathing  Persistent pain or pressure in the chest  New confusion  Inability to wake or stay awake  Pale, gray, or blue-colored skin, lips, or nail beds, depending on skin tone *This list is not all possible symptoms. Please call your medical provider for any other symptoms that are severe or concerning to you. Call 911 or call ahead to your local emergency facility: Notify the operator that you are seeking care for someone who has or may have COVID-19. Call ahead before visiting your doctor  Call ahead. Many medical visits for routine care are being postponed or done by phone or telemedicine.  If you have a medical appointment that cannot be postponed, call your doctor's office, and tell them you have or may have COVID-19. This will help the office protect themselves and other patients. Get  tested  If you have symptoms of COVID-19, get tested. While waiting for test results, you stay away from others, including staying apart from those living in your household.  You can visit your state, tribal, local, and territorialhealth  department's website to look for the latest local information on testing sites. If you are sick, wear a mask over your nose and mouth  You should wear a mask over your nose and mouth if you must be around other people or animals, including pets (even at home).  You don't need to wear the mask if you are alone. If you can't put on a mask (because of trouble breathing, for example), cover your coughs and sneezes in some other way. Try to stay at least 6 feet away from other people. This will help protect the people around you.  Masks should not be placed on young children under age 51 years, anyone who has trouble breathing, or anyone who is not able to remove the mask without help. Note: During the COVID-19 pandemic, medical grade facemasks are reserved for healthcare workers and some first responders. Cover your coughs and sneezes  Cover your mouth and nose with a tissue when you cough or sneeze.  Throw away used tissues in a lined trash can.  Immediately wash your hands with soap and water for at least 20 seconds. If soap and water are not available, clean your hands with an alcohol-based hand sanitizer that contains at least 60% alcohol. Clean your hands often  Wash your hands often with soap and water for at least 20 seconds. This is especially important after blowing your nose, coughing, or sneezing; going to the bathroom; and before eating or preparing food.  Use hand sanitizer if soap and water are not available. Use an alcohol-based hand sanitizer with at least 60% alcohol, covering all surfaces of your hands and rubbing them together until they feel dry.  Soap and water are the best option, especially if hands are visibly dirty.  Avoid touching your eyes, nose, and mouth with unwashed hands.  Handwashing Tips Avoid sharing personal household items  Do not share dishes, drinking glasses, cups, eating utensils, towels, or bedding with other people in your home.  Wash these items  thoroughly after using them with soap and water or put in the dishwasher. Clean all "high-touch" surfaces everyday  Clean and disinfect high-touch surfaces in your "sick room" and bathroom; wear disposable gloves. Let someone else clean and disinfect surfaces in common areas, but you should clean your bedroom and bathroom, if possible.  If a caregiver or other person needs to clean and disinfect a sick person's bedroom or bathroom, they should do so on an as-needed basis. The caregiver/other person should wear a mask and disposable gloves prior to cleaning. They should wait as long as possible after the person who is sick has used the bathroom before coming in to clean and use the bathroom. ? High-touch surfaces include phones, remote controls, counters, tabletops, doorknobs, bathroom fixtures, toilets, keyboards, tablets, and bedside tables.  Clean and disinfect areas that may have blood, stool, or body fluids on them.  Use household cleaners and disinfectants. Clean the area or item with soap and water or another detergent if it is dirty. Then, use a household disinfectant. ? Be sure  to follow the instructions on the label to ensure safe and effective use of the product. Many products recommend keeping the surface wet for several minutes to ensure germs are killed. Many also recommend precautions such as wearing gloves and making sure you have good ventilation during use of the product. ? Use a product from [employer] List N: Disinfectants for Coronavirus (WLSLH-73). ? Complete Disinfection Guidance When you can be around others after being sick with COVID-19 Deciding when you can be around others is different for different situations. Find out when you can safely end home isolation. For any additional questions about your care, contact your healthcare provider or state or local health department. 01/25/2020 Content source: [facility] ([facility]), Division of  Viral Diseases This information is not intended to replace advice given to you by your health care provider. Make sure you discuss any questions you have with your health care provider. Document Revised: 09/10/2020 Document Reviewed: 09/10/2020 Elsevier Patient Education  2021 [employer].

## 2020-11-19 NOTE — Progress Notes (Signed)
This service is provided via telemedicine  No vital signs collected/recorded due to the encounter was a telemedicine visit.   Location of patient (ex: home, work): Home.  Patient consents to a telephone visit: Yes.  Location of the provider (ex: office, home):  [facility].  Name of any referring provider: Lauree Chandler, NP   Names of all persons participating in the telemedicine service and their role in the encounter: Patient, Heriberto Antigua, [place], [place], Webb Silversmith, NP.    Time spent on call: 8 minutes spent on the phone with Medical Assistant.     Location:      Place of Service:    Provider: Lizzett Nobile FNP-C  Lauree Chandler, NP  Patient Care Team: Lauree Chandler, NP as PCP - General (Geriatric Medicine) Nicholas Lose, MD as Consulting Physician (Hematology and Oncology) [facility]ists, [place] as Consulting Physician (Orthopedic Surgery)  Extended Emergency Contact Information Primary Emergency Contact: [place], [place], [place] [address] [name] of [place] Phone: [phone] Relation: Daughter Secondary Emergency Contact: [name] Address: [address]          [place], [place] [address] [place] Phone: [phone] Relation: Daughter  Code Status:  DNR Goals of care: Advanced Directive information Advanced Directives 11/19/2020  Does Patient Have a Medical Advance Directive? Yes  Type of Paramedic of [place];Out of facility DNR (pink MOST or yellow form)  Does patient want to make changes to medical advance directive? No - Patient declined  Copy of [place] in Chart? Yes - validated most recent copy scanned in chart (See row information)  Pre-existing out of facility DNR order (yellow form or pink MOST form) Yellow form placed in chart (order not valid for inpatient use);Pink MOST form placed in chart (order not valid for inpatient  use)     Chief Complaint  Patient presents with  . Acute Visit    Complains of cough, chills, low grade fever of 99, slight loss of taste, and positive Covid test x 5 days.    HPI:  Pt is a 86 y.o. female seen today for an acute visit for evaluation of cough,low grade fever of 99 ,slight loss of taste  x 5 days.she was scheduled for a video visit today but states phone and Internet not working well.She had a   Positive COVID-19 test done at [facility] labs.states had granddaughter went to [place] on over the New Year Holiday and she picked her up from the [place] station on Tuesday 11/13/2020.she had a cold.she drove her home over 20 minutes in the car.sitting together in the front seat.Granddaughter later tested positive for COVID-19 . Has had two Pfizer vaccine x 2.Had an appointment for a COVID-19 vaccine booster today. She denies any shortness of breath,chest tightness,chest pain or palpitation.  Had COVID-19 positive in the past last year.still had a liquoring cough.  Appetite is good.    Past Medical History:  Diagnosis Date  . Allergy   . Arthritis   . 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 Cancer ([place])   . Diabetes mellitus without complication ([place])   . GERD (gastroesophageal reflux disease)    occ  . Gout   . History of hiatal hernia   . HOH (hard of hearing)   . Hypertension   . Hypothyroidism   . Pneumonia  hx  . Thyroid disease   . Wears glasses    Past Surgical History:  Procedure Laterality Date  . BREAST BIOPSY Right 10/26/2013  . BREAST LUMPECTOMY Right 12/16/2013  . BREAST LUMPECTOMY WITH NEEDLE LOCALIZATION Right 12/16/2013   Procedure: BREAST LUMPECTOMY WITH NEEDLE LOCALIZATION;  Surgeon: Edward Jolly, MD;  Location: [facility];  Service: General;  Laterality: Right;  . CATARACT EXTRACTION Left 12/30/2017  . CHOLECYSTECTOMY  1970  . COLONOSCOPY    . FINGER SURGERY     right hand-  . gallbladder sugery    .  RE-EXCISION OF BREAST LUMPECTOMY Right 01/30/2014   Procedure: RE-EXCISION OF BREAST LUMPECTOMY;  Surgeon: Edward Jolly, MD;  Location: [facility];  Service: General;  Laterality: Right;  . RE-EXCISION OF BREAST LUMPECTOMY Right 03/06/2014   Procedure: RE-EXCISION OF BREAST LUMPECTOMY;  Surgeon: Edward Jolly, MD;  Location: [facility];  Service: General;  Laterality: Right;  . SHOULDER ARTHROSCOPY  2011   left  . THYROID SURGERY  1970  . TOTAL SHOULDER ARTHROPLASTY Right 05/21/2016  . TOTAL SHOULDER ARTHROPLASTY Right 05/21/2016   Procedure: RIGHT TOTAL SHOULDER ARTHROPLASTY;  Surgeon: Ninetta Lights, MD;  Location: [facility];  Service: Orthopedics;  Laterality: Right;  . TUBAL LIGATION      Allergies  Allergen Reactions  . Actos [Pioglitazone] Swelling  . Hydrocodone Nausea Only and Other (See Comments)    "bloated"  . Codeine Rash  . Neurontin [Gabapentin] Palpitations and Other (See Comments)    dizziness  . Other Rash    Nuts--PECANS  . Peanut-Containing Drug Products Rash    Outpatient Encounter Medications as of 11/19/2020  Medication Sig  . acetaminophen (TYLENOL) 500 MG tablet Take 500 mg by mouth as needed.  Marland Kitchen allopurinol (ZYLOPRIM) 100 MG tablet TAKE 1 TABLET BY MOUTH EVERY DAY FOR GOUT  . amLODipine (NORVASC) 5 MG tablet TAKE 1 TABLET BY MOUTH EVERY DAY  . atorvastatin (LIPITOR) 10 MG tablet Take 1 tablet (10 mg total) by mouth daily.  Marland Kitchen CALCIUM-VITAMIN D PO Take by mouth.  . diclofenac sodium (VOLTAREN) 1 % GEL Apply 4 g topically 4 (four) times daily as needed.  Marland Kitchen glipiZIDE (GLUCOTROL XL) 5 MG 24 hr tablet TAKE 1 TABLET BY MOUTH EVERY DAY WITH BREAKFAST  . levothyroxine (SYNTHROID) 100 MCG tablet TAKE 1 TABLET BY MOUTH EVERY DAY 30 MINUTES BEFORE BREAKFAST ON AN EMPTY STOMACH  . losartan (COZAAR) 50 MG tablet TAKE 1 TABLET(50 MG) BY MOUTH DAILY  . solifenacin (VESICARE) 5 MG tablet TAKE 1 TABLET(5 MG) BY MOUTH DAILY  .  triamcinolone cream (KENALOG) 0.1 % APPLY EXTERNALLY TO THE AFFECTED AREA TWICE DAILY  . ZINC OXIDE PO Take 1 tablet by mouth daily.   No facility-administered encounter medications on file as of 11/19/2020.    Review of Systems  Constitutional: Positive for fever. Negative for appetite change, chills, fatigue and unexpected weight change.  HENT: Negative for congestion, rhinorrhea, sinus pressure, sinus pain and sneezing.        Throat irritated from cough.Has had loss of taste  Respiratory: Positive for cough. Negative for chest tightness, shortness of breath and wheezing.   Cardiovascular: Negative for chest pain, palpitations and leg swelling.  Gastrointestinal: Negative for abdominal distention, abdominal pain, constipation, diarrhea, nausea and vomiting.  Skin: Negative for color change, pallor and rash.  Neurological: Negative for dizziness, speech difficulty, weakness, light-headedness, numbness and headaches.  Hematological: Does not bruise/bleed easily.  Psychiatric/Behavioral:  Negative for agitation, confusion, decreased concentration and sleep disturbance. The patient is not nervous/anxious.     Immunization History  Administered Date(s) Administered  . Fluad Quad(high Dose 65+) 10/08/2020  . Influenza, High Dose Seasonal PF 09/01/2019  . Influenza,inj,Quad PF,6+ Mos 09/07/2015, 07/25/2016, 09/22/2017  . Influenza-Unspecified 08/10/2014, 10/04/2018  . PFIZER SARS-COV-2 Vaccination 12/17/2019, 01/07/2020  . Pneumococcal Conjugate-13 01/14/2017  . Pneumococcal-Unspecified 11/10/2013  . Tdap 04/18/2016  . Zoster 04/18/2012   Pertinent  Health Maintenance Due  Topic Date Due  . OPHTHALMOLOGY EXAM  09/27/2020  . FOOT EXAM  01/29/2021  . HEMOGLOBIN A1C  04/12/2021  . INFLUENZA VACCINE  Completed  . DEXA SCAN  Completed  . PNA vac Low Risk Adult  Completed   Fall Risk  11/19/2020 10/08/2020 06/06/2020 03/07/2020 01/30/2020  Falls in the past year? 0 0 0 0 0  Number falls in  past yr: 0 0 0 0 0  Injury with Fall? 0 0 0 0 0   Functional Status Survey:    There were no vitals filed for this visit. There is no height or weight on file to calculate BMI. Physical Exam Unable to assess on Telephone visit.   Labs reviewed: Recent Labs    01/30/20 1352 06/06/20 1512 10/12/20 1140  NA 144 143 141  K 4.6 5.0 4.4  CL 107 108 107  CO2 31 26 25   GLUCOSE 135* 97 104*  BUN 16 19 22   CREATININE 1.02* 1.18* 1.29*  CALCIUM 10.1 9.9 9.8   Recent Labs    01/30/20 1352 06/06/20 1512 10/12/20 1140  AST 16 16 15   ALT 11 10 13   BILITOT 0.4 0.5 0.3  PROT 6.9 6.8 7.0   Recent Labs    01/30/20 1352 06/06/20 1512 10/12/20 1140  WBC 4.6 5.6 6.4  NEUTROABS 2,645 2,951 3,917  HGB 12.8 12.7 12.9  HCT 39.3 38.9 39.3  MCV 89.3 90.0 89.1  PLT 230 196 207   Lab Results  Component Value Date   TSH 0.54 06/06/2020   Lab Results  Component Value Date   HGBA1C 6.1 (H) 10/12/2020   Lab Results  Component Value Date   CHOL 182 10/12/2020   HDL 55 10/12/2020   LDLCALC 105 (H) 10/12/2020   TRIG 119 10/12/2020   CHOLHDL 3.3 10/12/2020    Significant Diagnostic Results in last 30 days:  No results found.  Assessment/Plan  1. Cough with exposure to COVID-19 virus Tested positive for COVID-19 today at a [employer] testing. No shortness of breath,wheeezing or palpitation reported. Has mucinex from previous episode of cough. - continue on mucinex  - encouraged to increase water intake  - Advised to take Zinc 50 mg tablet daily,Vitamin D 5000 units daily and Vitamin C 500 mg twice daily x 14 days. - Notify provider if symptoms worsen or fail to improve   2. Low grade fever Temp 99.0 today  - Advised to take Tylenol as needed for fever or chills.   Family/ staff Communication: Reviewed plan of care with patient  Labs/tests ordered: None   Next Appointment: As needed if symptoms worsen or fail to improve.  I connected with  Cyndy Freeze on 11/19/20  by a Telephone visit  enabled telemedicine application and verified that I am speaking with the correct person using two identifiers.   I discussed the limitations of evaluation and management by telemedicine. The patient expressed understanding and agreed to proceed.   Spent 20 minutes of non-face to face with patient  Sandrea Hughs, NP

## 2020-11-30 ENCOUNTER — Other Ambulatory Visit: Payer: Self-pay | Admitting: Nurse Practitioner

## 2020-11-30 DIAGNOSIS — R198 Other specified symptoms and signs involving the digestive system and abdomen: Secondary | ICD-10-CM

## 2020-11-30 DIAGNOSIS — R0989 Other specified symptoms and signs involving the circulatory and respiratory systems: Secondary | ICD-10-CM

## 2020-12-05 ENCOUNTER — Ambulatory Visit: Payer: Medicare Other | Admitting: Nurse Practitioner

## 2020-12-11 DIAGNOSIS — L218 Other seborrheic dermatitis: Secondary | ICD-10-CM | POA: Diagnosis not present

## 2020-12-14 ENCOUNTER — Other Ambulatory Visit: Payer: Self-pay | Admitting: Nurse Practitioner

## 2020-12-17 ENCOUNTER — Other Ambulatory Visit: Payer: Self-pay | Admitting: Nurse Practitioner

## 2021-01-22 ENCOUNTER — Telehealth: Payer: Self-pay

## 2021-01-22 ENCOUNTER — Other Ambulatory Visit: Payer: Self-pay

## 2021-01-22 ENCOUNTER — Ambulatory Visit (INDEPENDENT_AMBULATORY_CARE_PROVIDER_SITE_OTHER): Payer: Medicare Other | Admitting: Nurse Practitioner

## 2021-01-22 DIAGNOSIS — Z0000 Encounter for general adult medical examination without abnormal findings: Secondary | ICD-10-CM

## 2021-01-22 NOTE — Patient Instructions (Signed)
Karla Willis , Thank you for taking time to come for your Medicare Wellness Visit. I appreciate your ongoing commitment to your health goals. Please review the following plan we discussed and let me know if I can assist you in the future.   Screening recommendations/referrals: Colonoscopy aged out Mammogram up to date Bone Density up to date Recommended yearly ophthalmology/optometry visit for glaucoma screening and checkup Recommended yearly dental visit for hygiene and checkup  Vaccinations: Influenza vaccine up to date Pneumococcal vaccine up to date Tdap vaccine up to date Shingles vaccine RECOMMENDED to get at your local pharmacy.     Advanced directives: on file.   Conditions/risks identified: obesity, advance age, diabetes, hypertension,   Next appointment: 1 year for AWV    Preventive Care 46 Years and Older, Female Preventive care refers to lifestyle choices and visits with your health care provider that can promote health and wellness. What does preventive care include?  A yearly physical exam. This is also called an annual well check.  Dental exams once or twice a year.  Routine eye exams. Ask your health care provider how often you should have your eyes checked.  Personal lifestyle choices, including:  Daily care of your teeth and gums.  Regular physical activity.  Eating a healthy diet.  Avoiding tobacco and drug use.  Limiting alcohol use.  Practicing safe sex.  Taking low-dose aspirin every day.  Taking vitamin and mineral supplements as recommended by your health care provider. What happens during an annual well check? The services and screenings done by your health care provider during your annual well check will depend on your age, overall health, lifestyle risk factors, and family history of disease. Counseling  Your health care provider may ask you questions about your:  Alcohol use.  Tobacco use.  Drug use.  Emotional well-being.  Home  and relationship well-being.  Sexual activity.  Eating habits.  History of falls.  Memory and ability to understand (cognition).  Work and work Statistician.  Reproductive health. Screening  You may have the following tests or measurements:  Height, weight, and BMI.  Blood pressure.  Lipid and cholesterol levels. These may be checked every 5 years, or more frequently if you are over 71 years old.  Skin check.  Lung cancer screening. You may have this screening every year starting at age 60 if you have a 30-pack-year history of smoking and currently smoke or have quit within the past 15 years.  Fecal occult blood test (FOBT) of the stool. You may have this test every year starting at age 89.  Flexible sigmoidoscopy or colonoscopy. You may have a sigmoidoscopy every 5 years or a colonoscopy every 10 years starting at age 30.  Hepatitis C blood test.  Hepatitis B blood test.  Sexually transmitted disease (STD) testing.  Diabetes screening. This is done by checking your blood sugar (glucose) after you have not eaten for a while (fasting). You may have this done every 1-3 years.  Bone density scan. This is done to screen for osteoporosis. You may have this done starting at age 88.  Mammogram. This may be done every 1-2 years. Talk to your health care provider about how often you should have regular mammograms. Talk with your health care provider about your test results, treatment options, and if necessary, the need for more tests. Vaccines  Your health care provider may recommend certain vaccines, such as:  Influenza vaccine. This is recommended every year.  Tetanus, diphtheria, and acellular pertussis (  Tdap, Td) vaccine. You may need a Td booster every 10 years.  Zoster vaccine. You may need this after age 50.  Pneumococcal 13-valent conjugate (PCV13) vaccine. One dose is recommended after age 71.  Pneumococcal polysaccharide (PPSV23) vaccine. One dose is recommended  after age 44. Talk to your health care provider about which screenings and vaccines you need and how often you need them. This information is not intended to replace advice given to you by your health care provider. Make sure you discuss any questions you have with your health care provider. Document Released: 11/23/2015 Document Revised: 07/16/2016 Document Reviewed: 08/28/2015 Elsevier Interactive Patient Education  2017 [place] Prevention in the Home Falls can cause injuries. They can happen to people of all ages. There are many things you can do to make your home safe and to help prevent falls. What can I do on the outside of my home?  Regularly fix the edges of walkways and driveways and fix any cracks.  Remove anything that might make you trip as you walk through a door, such as a raised step or threshold.  Trim any bushes or trees on the path to your home.  Use bright outdoor lighting.  Clear any walking paths of anything that might make someone trip, such as rocks or tools.  Regularly check to see if handrails are loose or broken. Make sure that both sides of any steps have handrails.  Any raised decks and porches should have guardrails on the edges.  Have any leaves, snow, or ice cleared regularly.  Use sand or salt on walking paths during winter.  Clean up any spills in your garage right away. This includes oil or grease spills. What can I do in the bathroom?  Use night lights.  Install grab bars by the toilet and in the tub and shower. Do not use towel bars as grab bars.  Use non-skid mats or decals in the tub or shower.  If you need to sit down in the shower, use a plastic, non-slip stool.  Keep the floor dry. Clean up any water that spills on the floor as soon as it happens.  Remove soap buildup in the tub or shower regularly.  Attach bath mats securely with double-sided non-slip rug tape.  Do not have throw rugs and other things on the floor  that can make you trip. What can I do in the bedroom?  Use night lights.  Make sure that you have a light by your bed that is easy to reach.  Do not use any sheets or blankets that are too big for your bed. They should not hang down onto the floor.  Have a firm chair that has side arms. You can use this for support while you get dressed.  Do not have throw rugs and other things on the floor that can make you trip. What can I do in the kitchen?  Clean up any spills right away.  Avoid walking on wet floors.  Keep items that you use a lot in easy-to-reach places.  If you need to reach something above you, use a strong step stool that has a grab bar.  Keep electrical cords out of the way.  Do not use floor polish or wax that makes floors slippery. If you must use wax, use non-skid floor wax.  Do not have throw rugs and other things on the floor that can make you trip. What can I do with my stairs?  Do  not leave any items on the stairs.  Make sure that there are handrails on both sides of the stairs and use them. Fix handrails that are broken or loose. Make sure that handrails are as long as the stairways.  Check any carpeting to make sure that it is firmly attached to the stairs. Fix any carpet that is loose or worn.  Avoid having throw rugs at the top or bottom of the stairs. If you do have throw rugs, attach them to the floor with carpet tape.  Make sure that you have a light switch at the top of the stairs and the bottom of the stairs. If you do not have them, ask someone to add them for you. What else can I do to help prevent falls?  Wear shoes that:  Do not have high heels.  Have rubber bottoms.  Are comfortable and fit you well.  Are closed at the toe. Do not wear sandals.  If you use a stepladder:  Make sure that it is fully opened. Do not climb a closed stepladder.  Make sure that both sides of the stepladder are locked into place.  Ask someone to hold it  for you, if possible.  Clearly mark and make sure that you can see:  Any grab bars or handrails.  First and last steps.  Where the edge of each step is.  Use tools that help you move around (mobility aids) if they are needed. These include:  Canes.  Walkers.  Scooters.  Crutches.  Turn on the lights when you go into a dark area. Replace any light bulbs as soon as they burn out.  Set up your furniture so you have a clear path. Avoid moving your furniture around.  If any of your floors are uneven, fix them.  If there are any pets around you, be aware of where they are.  Review your medicines with your doctor. Some medicines can make you feel dizzy. This can increase your chance of falling. Ask your doctor what other things that you can do to help prevent falls. This information is not intended to replace advice given to you by your health care provider. Make sure you discuss any questions you have with your health care provider. Document Released: 08/23/2009 Document Revised: 04/03/2016 Document Reviewed: 12/01/2014 Elsevier Interactive Patient Education  2017 [employer].

## 2021-01-22 NOTE — Telephone Encounter (Signed)
Ms. Karla Willis, lukic are scheduled for a virtual visit with your provider today.    Just as we do with appointments in the office, we must obtain your consent to participate.  Your consent will be active for this visit and any virtual visit you may have with one of our providers in the next 365 days.    If you have a MyChart account, I can also send a copy of this consent to you electronically.  All virtual visits are billed to your insurance company just like a traditional visit in the office.  As this is a virtual visit, video technology does not allow for your provider to perform a traditional examination.  This may limit your provider's ability to fully assess your condition.  If your provider identifies any concerns that need to be evaluated in person or the need to arrange testing such as labs, EKG, etc, we will make arrangements to do so.    Although advances in technology are sophisticated, we cannot ensure that it will always work on either your end or our end.  If the connection with a video visit is poor, we may have to switch to a telephone visit.  With either a video or telephone visit, we are not always able to ensure that we have a secure connection.   I need to obtain your verbal consent now.   Are you willing to proceed with your visit today?   TARA RUD has provided verbal consent on 01/22/2021 for a virtual visit (video or telephone).   Carroll Kinds, CMA 01/22/2021  10:19 AM

## 2021-01-22 NOTE — Progress Notes (Signed)
Subjective:   Karla Willis is a 86 y.o. female who presents for Medicare Annual (Subsequent) preventive examination.  Review of Systems           Objective:    There were no vitals filed for this visit. There is no height or weight on file to calculate BMI.  Advanced Directives 11/19/2020 10/08/2020 06/06/2020 03/07/2020 01/30/2020 01/17/2020 04/21/2019  Does Patient Have a Medical Advance Directive? Yes Yes Yes Yes Yes Yes Yes  Type of Paramedic of [place];Out of facility DNR (pink MOST or yellow form) [place];Out of facility DNR (pink MOST or yellow form) [place];Living will;Out of facility DNR (pink MOST or yellow form) [place];Living will;Out of facility DNR (pink MOST or yellow form) [place];Living will;Out of facility DNR (pink MOST or yellow form) [place];Out of facility DNR (pink MOST or yellow form);Living will Living will;Healthcare Power of Attorney  Does patient want to make changes to medical advance directive? No - Patient declined No - Patient declined No - Patient declined No - Patient declined No - Patient declined No - Patient declined No - Patient declined  Copy of [place] in Chart? Yes - validated most recent copy scanned in chart (See row information) Yes - validated most recent copy scanned in chart (See row information) Yes - validated most recent copy scanned in chart (See row information) Yes - validated most recent copy scanned in chart (See row information) Yes - validated most recent copy scanned in chart (See row information) Yes - validated most recent copy scanned in chart (See row information) No - copy requested  Pre-existing out of facility DNR order (yellow form or pink MOST form) Yellow form placed in chart (order not valid for inpatient use);Pink MOST form placed in chart (order not valid for inpatient use)  Yellow form placed in chart (order not valid for inpatient use);Pink MOST form placed in chart (order not valid for inpatient use) Yellow form placed in chart (order not valid for inpatient use);Pink MOST form placed in chart (order not valid for inpatient use) - Yellow form placed in chart (order not valid for inpatient use);Pink MOST form placed in chart (order not valid for inpatient use) Pink MOST/Yellow Form most recent copy in chart - Physician notified to receive inpatient order -    Current Medications (verified) Outpatient Encounter Medications as of 01/22/2021  Medication Sig  . acetaminophen (TYLENOL) 500 MG tablet Take 500 mg by mouth as needed.  Marland Kitchen allopurinol (ZYLOPRIM) 100 MG tablet TAKE 1 TABLET BY MOUTH EVERY DAY FOR GOUT  . amLODipine (NORVASC) 5 MG tablet TAKE 1 TABLET BY MOUTH EVERY DAY  . atorvastatin (LIPITOR) 10 MG tablet Take 1 tablet (10 mg total) by mouth daily.  Marland Kitchen CALCIUM-VITAMIN D PO Take by mouth.  . diclofenac sodium (VOLTAREN) 1 % GEL Apply 4 g topically 4 (four) times daily as needed.  Marland Kitchen glipiZIDE (GLUCOTROL XL) 5 MG 24 hr tablet TAKE 1 TABLET BY MOUTH EVERY DAY WITH BREAKFAST  . levothyroxine (SYNTHROID) 100 MCG tablet TAKE 1 TABLET BY MOUTH EVERY DAY 30 MINUTES BEFORE BREAKFAST ON AN EMPTY STOMACH  . losartan (COZAAR) 50 MG tablet TAKE 1 TABLET(50 MG) BY MOUTH DAILY  . triamcinolone cream (KENALOG) 0.1 % APPLY EXTERNALLY TO THE AFFECTED AREA TWICE DAILY  . [DISCONTINUED] solifenacin (VESICARE) 5 MG tablet TAKE 1 TABLET(5 MG) BY MOUTH DAILY  . [DISCONTINUED]  ZINC OXIDE PO Take 1 tablet by mouth daily.   No facility-administered encounter medications on file as of 01/22/2021.    Allergies (verified) Actos [pioglitazone], Hydrocodone, Codeine, Neurontin [gabapentin], Other, and Peanut-containing drug products   History: Past Medical History:  Diagnosis Date  . Allergy   . Arthritis   . 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 Cancer ([place])    . Diabetes mellitus without complication ([place])   . GERD (gastroesophageal reflux disease)    occ  . Gout   . History of hiatal hernia   . HOH (hard of hearing)   . Hypertension   . Hypothyroidism   . Pneumonia    hx  . Thyroid disease   . Wears glasses    Past Surgical History:  Procedure Laterality Date  . BREAST BIOPSY Right 10/26/2013  . BREAST LUMPECTOMY Right 12/16/2013  . BREAST LUMPECTOMY WITH NEEDLE LOCALIZATION Right 12/16/2013   Procedure: BREAST LUMPECTOMY WITH NEEDLE LOCALIZATION;  Surgeon: Edward Jolly, MD;  Location: [facility];  Service: General;  Laterality: Right;  . CATARACT EXTRACTION Left 12/30/2017  . CHOLECYSTECTOMY  1970  . COLONOSCOPY    . FINGER SURGERY     right hand-  . gallbladder sugery    . RE-EXCISION OF BREAST LUMPECTOMY Right 01/30/2014   Procedure: RE-EXCISION OF BREAST LUMPECTOMY;  Surgeon: Edward Jolly, MD;  Location: [facility];  Service: General;  Laterality: Right;  . RE-EXCISION OF BREAST LUMPECTOMY Right 03/06/2014   Procedure: RE-EXCISION OF BREAST LUMPECTOMY;  Surgeon: Edward Jolly, MD;  Location: [facility];  Service: General;  Laterality: Right;  . SHOULDER ARTHROSCOPY  2011   left  . THYROID SURGERY  1970  . TOTAL SHOULDER ARTHROPLASTY Right 05/21/2016  . TOTAL SHOULDER ARTHROPLASTY Right 05/21/2016   Procedure: RIGHT TOTAL SHOULDER ARTHROPLASTY;  Surgeon: Ninetta Lights, MD;  Location: [facility];  Service: Orthopedics;  Laterality: Right;  . TUBAL LIGATION     Family History  Problem Relation Age of Onset  . Diabetes Mother   . Arthritis Father   . Seizures Sister   . Pneumonia Daughter   . Cancer Sister        breast  . Breast cancer Sister   . Multiple sclerosis Son        Deceased    Social History   Socioeconomic History  . Marital status: Widowed    Spouse name: Not on file  . Number of children: Not on file  . Years of education: Not on file  .  Highest education level: Not on file  Occupational History  . Not on file  Tobacco Use  . Smoking status: Never Smoker  . Smokeless tobacco: Never Used  Vaping Use  . Vaping Use: Never used  Substance and Sexual Activity  . Alcohol use: No    Alcohol/week: 0.0 standard drinks  . Drug use: No  . Sexual activity: Not Currently    Comment: menarche age 39,1st intercourse 86 yo-Fewer than 5 partners  Other Topics Concern  . Not on file  Social History Narrative   Diet: No      Do you drink/ eat things with caffeine? Yes      Marital status:    Widowed                           What year were you married ?  [place]  Do you live in a house, apartment,assistred living, condo, trailer, etc.)?  apartment      Is it one or more stories?  one      How many persons live in your home ?  3      Do you have any pets in your home ?(please list)  No      Current or past profession:  Bank, Caregiver      Do you exercise?  No                            Type & how often:        Do you have a living will?  Yes      Do you have a DNR form?  Yes                     If not, do you want to discuss one?       Do you have signed POA?HPOA forms?                 If so, please bring to your        appointment      Social Determinants of Health   Financial Resource Strain: Not on file  Food Insecurity: Not on file  Transportation Needs: Not on file  Physical Activity: Not on file  Stress: Not on file  Social Connections: Not on file    Tobacco Counseling Counseling given: Not Answered   Clinical Intake:                 Diabetic?yes         Activities of Daily Living No flowsheet data found.  Patient Care Team: Lauree Chandler, NP as PCP - General (Geriatric Medicine) Nicholas Lose, MD as Consulting Physician (Hematology and Oncology) Specialists, [place] as Consulting Physician (Orthopedic Surgery)  Indicate any recent Medical Services you  may have received from other than [facility] providers in the past year (date may be approximate).     Assessment:   This is a routine wellness examination for Karla Willis.  Hearing/Vision screen  Hearing Screening   125Hz  250Hz  500Hz  1000Hz  2000Hz  3000Hz  4000Hz  6000Hz  8000Hz   Right ear:           Left ear:           Comments: Patient wears hearing aids  Vision Screening Comments: Patient has no vision problems. Patient recently had eye exam wihtin the last 6 months. Patient sees Dr. Katy Fitch  Dietary issues and exercise activities discussed:    Goals    . DIET - INCREASE WATER INTAKE     Patient wiill keep water bottles with her to increase water intake    . Increase water intake     Starting 01/14/17, I will attempt to increase my water intake from 4 bottles of water to 6 bottles of water per day.       Depression Screen PHQ 2/9 Scores 01/22/2021 01/17/2020 05/11/2019 01/11/2019 01/22/2018 01/07/2018 01/14/2017  PHQ - 2 Score 0 0 0 0 0 0 0    Fall Risk Fall Risk  01/22/2021 11/19/2020 10/08/2020 06/06/2020 03/07/2020  Falls in the past year? 1 0 0 0 0  Number falls in past yr: 1 0 0 0 0  Injury with Fall? 0 0 0 0 0    FALL RISK PREVENTION PERTAINING TO THE HOME:  Any stairs in or around  the home? No  If so, are there any without handrails? No  Home free of loose throw rugs in walkways, pet beds, electrical cords, etc? Yes  Adequate lighting in your home to reduce risk of falls? Yes   ASSISTIVE DEVICES UTILIZED TO PREVENT FALLS:  Life alert? Yes  Use of a cane, walker or w/c? Yes  Grab bars in the bathroom? No  Shower chair or bench in shower? Yes  Elevated toilet seat or a handicapped toilet? Yes   TIMED UP AND GO:  Was the test performed? No .    Cognitive Function: MMSE - Mini Mental State Exam 01/11/2019 01/07/2018 01/14/2017 01/11/2016  Not completed: - - - (No Data)  Orientation to time 5 5 5 5   Orientation to Place 5 5 5 5   Registration 3 3 3 3   Attention/ Calculation 5 5 5 5    Recall 3 1 3 2   Language- name 2 objects 2 2 2 2   Language- repeat 1 1 1 1   Language- follow 3 step command 3 3 3 3   Language- read & follow direction 1 1 1 1   Write a sentence 1 1 1 1   Copy design 1 1 1 1   Total score 30 28 30 29      6CIT Screen 01/17/2020  What Year? 0 points  What month? 0 points  What time? 0 points  Count back from 20 0 points  Months in reverse 0 points  Repeat phrase 0 points  Total Score 0    Immunizations Immunization History  Administered Date(s) Administered  . Fluad Quad(high Dose 65+) 10/08/2020  . Influenza, High Dose Seasonal PF 09/01/2019  . Influenza,inj,Quad PF,6+ Mos 09/07/2015, 07/25/2016, 09/22/2017  . Influenza-Unspecified 08/10/2014, 10/04/2018  . PFIZER(Purple Top)SARS-COV-2 Vaccination 12/17/2019, 01/07/2020  . Pneumococcal Conjugate-13 01/14/2017  . Pneumococcal-Unspecified 11/10/2013  . Tdap 04/18/2016  . Zoster 04/18/2012    TDAP status: Up to date  Flu Vaccine status: Up to date  Pneumococcal vaccine status: Up to date  Covid-19 vaccine status: Completed vaccines  Qualifies for Shingles Vaccine? Yes   Zostavax completed Yes   Shingrix Completed?: No.    Education has been provided regarding the importance of this vaccine. Patient has been advised to call insurance company to determine out of pocket expense if they have not yet received this vaccine. Advised may also receive vaccine at local pharmacy or Health Dept. Verbalized acceptance and understanding.  Screening Tests Health Maintenance  Topic Date Due  . COVID-19 Vaccine (3 - Pfizer risk 4-dose series) 02/04/2020  . OPHTHALMOLOGY EXAM  09/27/2020  . FOOT EXAM  01/29/2021  . HEMOGLOBIN A1C  04/12/2021  . DEXA SCAN  01/31/2022  . TETANUS/TDAP  04/18/2026  . INFLUENZA VACCINE  Completed  . PNA vac Low Risk Adult  Completed  . HPV VACCINES  Aged Out    Health Maintenance  Health Maintenance Due  Topic Date Due  . COVID-19 Vaccine (3 - Pfizer risk 4-dose  series) 02/04/2020  . OPHTHALMOLOGY EXAM  09/27/2020    Colorectal cancer screening: No longer required.   Mammogram status: No longer required due to aged out.  Bone Density status: Completed 01/2020. Results reflect: Bone density results: OSTEOPENIA. Repeat every 2 years.  Lung Cancer Screening: (Low Dose CT Chest recommended if Age 2-80 years, 30 pack-year currently smoking OR have quit w/in 15years.) does not qualify.    Additional Screening:  Hepatitis C Screening: does not qualify;   Vision Screening: Recommended annual ophthalmology exams for early detection  of glaucoma and other disorders of the eye. Is the patient up to date with their annual eye exam?  Yes  Who is the provider or what is the name of the office in which the patient attends annual eye exams? [facility] If pt is not established with a provider, would they like to be referred to a provider to establish care? No .   Dental Screening: Recommended annual dental exams for proper oral hygiene  Community Resource Referral / Chronic Care Management: CRR required this visit?  No   CCM required this visit?  No      Plan:     I have personally reviewed and noted the following in the patient's chart:   . Medical and social history . Use of alcohol, tobacco or illicit drugs  . Current medications and supplements . Functional ability and status . Nutritional status . Physical activity . Advanced directives . List of other physicians . Hospitalizations, surgeries, and ER visits in previous 12 months . Vitals . Screenings to include cognitive, depression, and falls . Referrals and appointments  In addition, I have reviewed and discussed with patient certain preventive protocols, quality metrics, and best practice recommendations. A written personalized care plan for preventive services as well as general preventive health recommendations were provided to patient.     Lauree Chandler, NP   01/22/2021     Virtual Visit via Telephone Note  I connected with@ on 01/22/21 at 10:00 AM EDT by telephone and verified that I am speaking with the correct person using two identifiers.  Location: Patient: home Provider: [facility]   I discussed the limitations, risks, security and privacy concerns of performing an evaluation and management service by telephone and the availability of in person appointments. I also discussed with the patient that there may be a patient responsible charge related to this service. The patient expressed understanding and agreed to proceed.   I discussed the assessment and treatment plan with the patient. The patient was provided an opportunity to ask questions and all were answered. The patient agreed with the plan and demonstrated an understanding of the instructions.   The patient was advised to call back or seek an in-person evaluation if the symptoms worsen or if the condition fails to improve as anticipated.  I provided 18 minutes of non-face-to-face time during this encounter.  Carlos American. Harle Battiest Avs printed and mailed

## 2021-01-22 NOTE — Progress Notes (Signed)
This service is provided via telemedicine  No vital signs collected/recorded due to the encounter was a telemedicine visit.   Location of patient (ex: home, work):  Home  Patient consents to a telephone visit:  Yes, see encounter dated 01/22/2021  Location of the provider (ex: office, home):  [place]  Name of any referring provider: N/A  Names of all persons participating in the telemedicine service and their role in the encounter:  Sherrie Mustache, Nurse Practitioner, Carroll Kinds, CMA, and patient.   Time spent on call:  11 minutes with medical assistant.

## 2021-02-02 ENCOUNTER — Other Ambulatory Visit: Payer: Self-pay | Admitting: Nurse Practitioner

## 2021-02-02 DIAGNOSIS — I10 Essential (primary) hypertension: Secondary | ICD-10-CM

## 2021-02-04 ENCOUNTER — Ambulatory Visit: Payer: Medicare Other | Admitting: Nurse Practitioner

## 2021-02-15 ENCOUNTER — Ambulatory Visit (INDEPENDENT_AMBULATORY_CARE_PROVIDER_SITE_OTHER): Payer: Medicare Other | Admitting: Nurse Practitioner

## 2021-02-15 ENCOUNTER — Other Ambulatory Visit: Payer: Self-pay

## 2021-02-15 ENCOUNTER — Encounter: Payer: Self-pay | Admitting: Nurse Practitioner

## 2021-02-15 VITALS — BP 148/80 | HR 94 | Temp 97.1°F | Ht 63.0 in | Wt 275.8 lb

## 2021-02-15 DIAGNOSIS — E034 Atrophy of thyroid (acquired): Secondary | ICD-10-CM

## 2021-02-15 DIAGNOSIS — M5441 Lumbago with sciatica, right side: Secondary | ICD-10-CM

## 2021-02-15 DIAGNOSIS — M1000 Idiopathic gout, unspecified site: Secondary | ICD-10-CM

## 2021-02-15 DIAGNOSIS — L602 Onychogryphosis: Secondary | ICD-10-CM | POA: Diagnosis not present

## 2021-02-15 DIAGNOSIS — N644 Mastodynia: Secondary | ICD-10-CM

## 2021-02-15 DIAGNOSIS — I10 Essential (primary) hypertension: Secondary | ICD-10-CM

## 2021-02-15 DIAGNOSIS — E1122 Type 2 diabetes mellitus with diabetic chronic kidney disease: Secondary | ICD-10-CM | POA: Diagnosis not present

## 2021-02-15 DIAGNOSIS — N3281 Overactive bladder: Secondary | ICD-10-CM | POA: Diagnosis not present

## 2021-02-15 DIAGNOSIS — E785 Hyperlipidemia, unspecified: Secondary | ICD-10-CM

## 2021-02-15 DIAGNOSIS — N182 Chronic kidney disease, stage 2 (mild): Secondary | ICD-10-CM

## 2021-02-15 DIAGNOSIS — G8929 Other chronic pain: Secondary | ICD-10-CM

## 2021-02-15 DIAGNOSIS — Z6841 Body Mass Index (BMI) 40.0 and over, adult: Secondary | ICD-10-CM

## 2021-02-15 MED ORDER — DULOXETINE HCL 20 MG PO CPEP: 20.0000 mg | ORAL_CAPSULE | Freq: Every day | ORAL | 3 refills | Status: DC

## 2021-02-15 MED ORDER — AMLODIPINE BESYLATE 10 MG PO TABS: 10.0000 mg | ORAL_TABLET | Freq: Every day | ORAL | 3 refills | Status: DC

## 2021-02-15 NOTE — Patient Instructions (Signed)
Increase norvasc to 10 mg daily  Start cymbalta 20 mg daily for pain   DASH Eating Plan DASH stands for Dietary Approaches to Stop Hypertension. The DASH eating plan is a healthy eating plan that has been shown to:  Reduce high blood pressure (hypertension).  Reduce your risk for type 2 diabetes, heart disease, and stroke.  Help with weight loss. What are tips for following this plan? Reading food labels  Check food labels for the amount of salt (sodium) per serving. Choose foods with less than 5 percent of the Daily Value of sodium. Generally, foods with less than 300 milligrams (mg) of sodium per serving fit into this eating plan.  To find whole grains, look for the word "whole" as the first word in the ingredient list. Shopping  Buy products labeled as "low-sodium" or "no salt added."  Buy fresh foods. Avoid canned foods and pre-made or frozen meals. Cooking  Avoid adding salt when cooking. Use salt-free seasonings or herbs instead of table salt or sea salt. Check with your health care provider or pharmacist before using salt substitutes.  Do not fry foods. Cook foods using healthy methods such as baking, boiling, grilling, roasting, and broiling instead.  Cook with heart-healthy oils, such as olive, canola, avocado, soybean, or sunflower oil. Meal planning  Eat a balanced diet that includes: ? 4 or more servings of fruits and 4 or more servings of vegetables each day. Try to fill one-half of your plate with fruits and vegetables. ? 6-8 servings of whole grains each day. ? Less than 6 oz (170 g) of lean meat, poultry, or fish each day. A 3-oz (85-g) serving of meat is about the same size as a deck of cards. One egg equals 1 oz (28 g). ? 2-3 servings of low-fat dairy each day. One serving is 1 cup (237 mL). ? 1 serving of nuts, seeds, or beans 5 times each week. ? 2-3 servings of heart-healthy fats. Healthy fats called omega-3 fatty acids are found in foods such as walnuts,  flaxseeds, fortified milks, and eggs. These fats are also found in cold-water fish, such as sardines, salmon, and mackerel.  Limit how much you eat of: ? Canned or prepackaged foods. ? Food that is high in trans fat, such as some fried foods. ? Food that is high in saturated fat, such as fatty meat. ? Desserts and other sweets, sugary drinks, and other foods with added sugar. ? Full-fat dairy products.  Do not salt foods before eating.  Do not eat more than 4 egg yolks a week.  Try to eat at least 2 vegetarian meals a week.  Eat more home-cooked food and less restaurant, buffet, and fast food.   Lifestyle  When eating at a restaurant, ask that your food be prepared with less salt or no salt, if possible.  If you drink alcohol: ? Limit how much you use to:  0-1 drink a day for women who are not pregnant.  0-2 drinks a day for men. ? Be aware of how much alcohol is in your drink. In the [place] one drink equals one 12 oz bottle of beer (355 mL), one 5 oz glass of wine (148 mL), or one 1 oz glass of hard liquor (44 mL). General information  Avoid eating more than 2,300 mg of salt a day. If you have hypertension, you may need to reduce your sodium intake to 1,500 mg a day.  Work with your health care provider to maintain a  healthy body weight or to lose weight. Ask what an ideal weight is for you.  Get at least 30 minutes of exercise that causes your heart to beat faster (aerobic exercise) most days of the week. Activities may include walking, swimming, or biking.  Work with your health care provider or dietitian to adjust your eating plan to your individual calorie needs. What foods should I eat? Fruits All fresh, dried, or frozen fruit. Canned fruit in natural juice (without added sugar). Vegetables Fresh or frozen vegetables (raw, steamed, roasted, or grilled). Low-sodium or reduced-sodium tomato and vegetable juice. Low-sodium or reduced-sodium tomato sauce and tomato paste.  Low-sodium or reduced-sodium canned vegetables. Grains Whole-grain or whole-wheat bread. Whole-grain or whole-wheat pasta. Brown rice. [place]. Bulgur. Whole-grain and low-sodium cereals. Pita bread. Low-fat, low-sodium crackers. Whole-wheat flour tortillas. Meats and other proteins Skinless chicken or [place]. Ground chicken or [place]. Pork with fat trimmed off. Fish and seafood. Egg whites. Dried beans, peas, or lentils. Unsalted nuts, nut butters, and seeds. Unsalted canned beans. Lean cuts of beef with fat trimmed off. Low-sodium, lean precooked or cured meat, such as sausages or meat loaves. Dairy Low-fat (1%) or fat-free (skim) milk. Reduced-fat, low-fat, or fat-free cheeses. Nonfat, low-sodium ricotta or cottage cheese. Low-fat or nonfat yogurt. Low-fat, low-sodium cheese. Fats and oils Soft margarine without trans fats. Vegetable oil. Reduced-fat, low-fat, or light mayonnaise and salad dressings (reduced-sodium). Canola, safflower, olive, avocado, soybean, and sunflower oils. Avocado. Seasonings and condiments Herbs. Spices. Seasoning mixes without salt. Other foods Unsalted popcorn and pretzels. Fat-free sweets. The items listed above may not be a complete list of foods and beverages you can eat. Contact a dietitian for more information. What foods should I avoid? Fruits Canned fruit in a light or heavy syrup. Fried fruit. Fruit in cream or butter sauce. Vegetables Creamed or fried vegetables. Vegetables in a cheese sauce. Regular canned vegetables (not low-sodium or reduced-sodium). Regular canned tomato sauce and paste (not low-sodium or reduced-sodium). Regular tomato and vegetable juice (not low-sodium or reduced-sodium). Angie Fava. Olives. Grains Baked goods made with fat, such as croissants, muffins, or some breads. Dry pasta or rice meal packs. Meats and other proteins Fatty cuts of meat. Ribs. Fried meat. Berniece Salines. Bologna, salami, and other precooked or cured meats, such as  sausages or meat loaves. Fat from the back of a pig (fatback). Bratwurst. Salted nuts and seeds. Canned beans with added salt. Canned or smoked fish. Whole eggs or egg yolks. Chicken or [place] with skin. Dairy Whole or 2% milk, cream, and half-and-half. Whole or full-fat cream cheese. Whole-fat or sweetened yogurt. Full-fat cheese. Nondairy creamers. Whipped toppings. Processed cheese and cheese spreads. Fats and oils Butter. Stick margarine. Lard. Shortening. Ghee. Bacon fat. Tropical oils, such as coconut, palm kernel, or palm oil. Seasonings and condiments Onion salt, garlic salt, seasoned salt, table salt, and sea salt. Worcestershire sauce. Tartar sauce. Barbecue sauce. Teriyaki sauce. Soy sauce, including reduced-sodium. Steak sauce. Canned and packaged gravies. Fish sauce. Oyster sauce. Cocktail sauce. Store-bought horseradish. Ketchup. Mustard. Meat flavorings and tenderizers. Bouillon cubes. Hot sauces. Pre-made or packaged marinades. Pre-made or packaged taco seasonings. Relishes. Regular salad dressings. Other foods Salted popcorn and pretzels. The items listed above may not be a complete list of foods and beverages you should avoid. Contact a dietitian for more information. Where to find more information  National Heart, Lung, and Blood Institute: https://wilson-eaton.com/  American Heart Association: www.heart.org  Academy of Nutrition and Dietetics: www.eatright.[place]: www.kidney.org Summary  The DASH eating plan is a healthy eating plan that has been shown to reduce high blood pressure (hypertension). It may also reduce your risk for type 2 diabetes, heart disease, and stroke.  When on the DASH eating plan, aim to eat more fresh fruits and vegetables, whole grains, lean proteins, low-fat dairy, and heart-healthy fats.  With the DASH eating plan, you should limit salt (sodium) intake to 2,300 mg a day. If you have hypertension, you may need to reduce your  sodium intake to 1,500 mg a day.  Work with your health care provider or dietitian to adjust your eating plan to your individual calorie needs. This information is not intended to replace advice given to you by your health care provider. Make sure you discuss any questions you have with your health care provider. Document Revised: 09/30/2019 Document Reviewed: 09/30/2019 Elsevier Patient Education  2021 [employer].

## 2021-02-15 NOTE — Progress Notes (Signed)
Careteam: Patient Care Team: Karla Chandler, NP as PCP - General (Geriatric Medicine) Karla Lose, MD as Consulting Physician (Hematology and Oncology) Specialists, [place] as Consulting Physician (Orthopedic Surgery)  PLACE OF SERVICE:  [place] Directive information Does Patient Have a Medical Advance Directive?: Yes, Type of Advance Directive: [place];Out of facility DNR (pink MOST or yellow form), Pre-existing out of facility DNR order (yellow form or pink MOST form): Yellow form placed in chart (order not valid for inpatient use);Pink MOST form placed in chart (order not valid for inpatient use), Does patient want to make changes to medical advance directive?: No - Patient declined  Allergies  Allergen Reactions  . Actos [Pioglitazone] Swelling  . Hydrocodone Nausea Only and Other (See Comments)    "bloated"  . Codeine Rash  . Neurontin [Gabapentin] Palpitations and Other (See Comments)    dizziness  . Other Rash    Nuts--PECANS  . Peanut-Containing Drug Products Rash    Chief Complaint  Patient presents with  . Medical Management of Chronic Issues    4 month follow-up. Patient c/o pain in right breast (patient had cancer in this breast 6 years ago), pain on right side of body starting in back and radiating down into right leg (a long time ago patient had injections in back for nerve damage on this side), and head pain on the right side of head (temple area) off and on.      HPI: Patient is a 86 y.o. female for follow up  Breast pain- right breast pain that comes and goes since last friday. Describes it as sharp pain that does not last long. Tender to touch, slight tingling. Has a history of breast cancer 6 years ago, followed by Karla Willis. Reports she has not lifted any heavy objects recently.   Head pain- shooting/throbbing pain in temporal area over the past week. Took tylenol pain and pain subsided but came  back, however has not reoccurred at this time. No changes in vision, nausea or vomiting.   History of sciatic nerve pain- has noticed recently when she is sitting or driving for an extended period of time sciatica pain occurs. Describes it as a shooting pain down the right leg but across the midline.   DM- does not check sugars at home, needs another machine. Reports no numbness or tingling. Denies symptoms of hypoglycemia. Went to eye doctor last november, no diabetic retinopathy.   Overactive bladder- goes to bathroom twice at night, more during the day.Taking vesicare daily.  Gout- no recent flares, on allopurinol   HTN- BP 148/80 today, takes BP at home and reports it is around 130-140's. Taking norvasc 5 mg daily  Review of Systems:  Review of Systems  Constitutional: Negative for chills, fever and weight loss.  HENT: Negative for tinnitus.   Respiratory: Negative for cough, sputum production and shortness of breath.   Cardiovascular: Negative for chest pain and palpitations.  Gastrointestinal: Negative for abdominal pain, constipation, nausea and vomiting.  Genitourinary: Positive for urgency. Negative for dysuria and frequency.  Musculoskeletal: Positive for back pain and joint pain. Negative for falls and myalgias.  Skin: Negative.   Neurological: Negative for dizziness, weakness and headaches.  Psychiatric/Behavioral: Positive for depression. Negative for memory loss. The patient does not have insomnia.     Past Medical History:  Diagnosis Date  . Allergy   . Arthritis   . 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 Cancer ([name])   . Diabetes mellitus without complication ([name])   . GERD (gastroesophageal reflux disease)    occ  . Gout   . History of hiatal hernia   . HOH (hard of hearing)   . Hypertension   . Hypothyroidism   . Pneumonia    hx  . Thyroid disease   . Wears glasses    Past Surgical History:  Procedure Laterality Date  . BREAST  BIOPSY Right 10/26/2013  . BREAST LUMPECTOMY Right 12/16/2013  . BREAST LUMPECTOMY WITH NEEDLE LOCALIZATION Right 12/16/2013   Procedure: BREAST LUMPECTOMY WITH NEEDLE LOCALIZATION;  Surgeon: Edward Jolly, MD;  Location: [facility];  Service: General;  Laterality: Right;  . CATARACT EXTRACTION Left 12/30/2017  . CHOLECYSTECTOMY  1970  . COLONOSCOPY    . FINGER SURGERY     right hand-  . gallbladder sugery    . RE-EXCISION OF BREAST LUMPECTOMY Right 01/30/2014   Procedure: RE-EXCISION OF BREAST LUMPECTOMY;  Surgeon: Edward Jolly, MD;  Location: [facility];  Service: General;  Laterality: Right;  . RE-EXCISION OF BREAST LUMPECTOMY Right 03/06/2014   Procedure: RE-EXCISION OF BREAST LUMPECTOMY;  Surgeon: Edward Jolly, MD;  Location: [facility];  Service: General;  Laterality: Right;  . SHOULDER ARTHROSCOPY  2011   left  . THYROID SURGERY  1970  . TOTAL SHOULDER ARTHROPLASTY Right 05/21/2016  . TOTAL SHOULDER ARTHROPLASTY Right 05/21/2016   Procedure: RIGHT TOTAL SHOULDER ARTHROPLASTY;  Surgeon: Ninetta Lights, MD;  Location: [facility];  Service: Orthopedics;  Laterality: Right;  . TUBAL LIGATION     Social History:   reports that she has never smoked. She has never used smokeless tobacco. She reports that she does not drink alcohol and does not use drugs.  Family History  Problem Relation Age of Onset  . Diabetes Mother   . Arthritis Father   . Seizures Sister   . Colon cancer Sister   . Pneumonia Daughter   . Cancer Sister        breast  . Breast cancer Sister   . Multiple sclerosis Son        Deceased     Medications: Patient's Medications  New Prescriptions   No medications on file  Previous Medications   ACETAMINOPHEN (TYLENOL) 500 MG TABLET    Take 500 mg by mouth as needed.   ALLOPURINOL (ZYLOPRIM) 100 MG TABLET    TAKE 1 TABLET BY MOUTH EVERY DAY FOR GOUT   AMLODIPINE (NORVASC) 5 MG TABLET    TAKE 1 TABLET BY  MOUTH EVERY DAY   ATORVASTATIN (LIPITOR) 10 MG TABLET    Take 1 tablet (10 mg total) by mouth daily.   CALCIUM-VITAMIN D PO    Take by mouth.   DICLOFENAC SODIUM (VOLTAREN) 1 % GEL    Apply 4 g topically 4 (four) times daily as needed.   GLIPIZIDE (GLUCOTROL XL) 5 MG 24 HR TABLET    TAKE 1 TABLET BY MOUTH EVERY DAY WITH BREAKFAST   LEVOTHYROXINE (SYNTHROID) 100 MCG TABLET    TAKE 1 TABLET BY MOUTH EVERY DAY 30 MINUTES BEFORE BREAKFAST ON AN EMPTY STOMACH   LOSARTAN (COZAAR) 50 MG TABLET    TAKE 1 TABLET(50 MG) BY MOUTH DAILY   TRIAMCINOLONE CREAM (KENALOG) 0.1 %    APPLY EXTERNALLY TO THE AFFECTED AREA TWICE DAILY  Modified Medications   No medications on file  Discontinued Medications   No medications on  file    Physical Exam:  Vitals:   02/15/21 1106  BP: (!) 148/80  Pulse: 100  Temp: (!) 97.1 F (36.2 C)  TempSrc: Temporal  SpO2: 97%  Weight: 275 lb 12.8 oz (125.1 kg)  Height: 5\' 3"  (1.6 m)   Body mass index is 48.86 kg/m. Wt Readings from Last 3 Encounters:  02/15/21 275 lb 12.8 oz (125.1 kg)  10/08/20 274 lb (124.3 kg)  06/06/20 (!) 268 lb (121.6 kg)    Physical Exam Constitutional:      General: She is not in acute distress.    Appearance: Normal appearance. She is not diaphoretic.  HENT:     Head: Normocephalic and atraumatic.     Mouth/Throat:     Pharynx: No oropharyngeal exudate.  Eyes:     Conjunctiva/sclera: Conjunctivae normal.     Pupils: Pupils are equal, round, and reactive to light.  Cardiovascular:     Rate and Rhythm: Normal rate and regular rhythm.     Heart sounds: Normal heart sounds.  Pulmonary:     Effort: Pulmonary effort is normal.     Breath sounds: Normal breath sounds.  Chest:  Breasts:     Right: Tenderness present.     Left: Normal.    Abdominal:     General: Bowel sounds are normal.     Palpations: Abdomen is soft.  Musculoskeletal:        General: No swelling or tenderness. Normal range of motion.  Feet:     Right foot:      Protective Sensation: 10 sites tested.     Toenail Condition: Right toenails are abnormally thick and long.     Left foot:     Protective Sensation: 10 sites tested. 5 sites sensed.     Toenail Condition: Left toenails are long.  Skin:    General: Skin is warm and dry.  Neurological:     Mental Status: She is alert and oriented to person, place, and time.     Labs reviewed: Basic Metabolic Panel: Recent Labs    06/06/20 1512 10/12/20 1140  NA 143 141  K 5.0 4.4  CL 108 107  CO2 26 25  GLUCOSE 97 104*  BUN 19 22  CREATININE 1.18* 1.29*  CALCIUM 9.9 9.8  TSH 0.54  --    Liver Function Tests: Recent Labs    06/06/20 1512 10/12/20 1140  AST 16 15  ALT 10 13  BILITOT 0.5 0.3  PROT 6.8 7.0   No results for input(s): LIPASE, AMYLASE in the last 8760 hours. No results for input(s): AMMONIA in the last 8760 hours. CBC: Recent Labs    06/06/20 1512 10/12/20 1140  WBC 5.6 6.4  NEUTROABS 2,951 3,917  HGB 12.7 12.9  HCT 38.9 39.3  MCV 90.0 89.1  PLT 196 207   Lipid Panel: Recent Labs    10/12/20 1140  CHOL 182  HDL 55  LDLCALC 105*  TRIG 119  CHOLHDL 3.3   TSH: Recent Labs    06/06/20 1512  TSH 0.54   A1C: Lab Results  Component Value Date   HGBA1C 6.1 (H) 10/12/2020     Assessment/Plan 1. Hyperlipidemia LDL goal <100 Encouraged dietary modifications. Continues on lipitor.   2. Idiopathic gout, unspecified chronicity, unspecified site No recent flares, continue allopurinol 100 mg daily  3. Chronic bilateral low back pain with right-sided sciatica Has a history of sciatica pain in the past. Encouraged weight loss and exercise. Recommend physcial therapy but she  declines at this time. Will place a referral to neurosurgery for evaluation. - DULoxetine (CYMBALTA) 20 MG capsule; Take 1 capsule (20 mg total) by mouth daily.  Dispense: 30 capsule; Refill: 3 - Ambulatory referral to Neurosurgery  4. Class 3 severe obesity due to excess calories  with serious comorbidity and body mass index (BMI) of 45.0 to 49.9 in adult [facility]) Encouraged weight loss with proper portion control and limiting foods high in sugar/carbs.   5. Type 2 diabetes mellitus with stage 2 chronic kidney disease, without long-term current use of insulin ([place]) Continues taking glipizide, no episodes of hypoglycemia. Educated on dietary modifications and limiting sugar intake. Encouraged routine foot care/monitoring and keeping up with her eye exams.  - Hemoglobin A1c  6. Overactive bladder Stable at this time. Continue vesicare  7. Hypothyroidism due to acquired atrophy of thyroid Last TSH 0.54, will recheck today. Continue synthroid 100 mg daily - TSH  8. Essential hypertension, benign BP slightly elevated today at 148/80, will increase norvasc to 10 mg daily and continue losartan 5 mg daily. Educated on limiting sodium intake. - COMPLETE METABOLIC PANEL WITH GFR - CBC with Differential/Platelet - amLODipine (NORVASC) 10 MG tablet; Take 1 tablet (10 mg total) by mouth daily.  Dispense: 90 tablet; Refill: 3  9. Overgrown toenails Nails trimmed with pt consent, tolerated well.   10. Breast pain ? Costochondritis, tenderness to palpitation around ribs. Encourage to continue use of tylenol and heat PRN   Next appt: 4 weeks to follow up BP and pain Jasmon Graffam K. [place], [facility] [phone]

## 2021-02-16 LAB — COMPLETE METABOLIC PANEL WITH GFR
AG Ratio: 1.2 (calc) (ref 1.0–2.5)
ALT: 15 U/L (ref 6–29)
AST: 19 U/L (ref 10–35)
Albumin: 4 g/dL (ref 3.6–5.1)
Alkaline phosphatase (APISO): 65 U/L (ref 37–153)
BUN/Creatinine Ratio: 12 (calc) (ref 6–22)
BUN: 15 mg/dL (ref 7–25)
CO2: 26 mmol/L (ref 20–32)
Calcium: 10.1 mg/dL (ref 8.6–10.4)
Chloride: 107 mmol/L (ref 98–110)
Creat: 1.23 mg/dL — ABNORMAL HIGH (ref 0.60–0.88)
GFR, Est African American: 47 mL/min/{1.73_m2} — ABNORMAL LOW (ref >=60)
GFR, Est Non African American: 41 mL/min/{1.73_m2} — ABNORMAL LOW (ref >=60)
Globulin: 3.3 g/dL (calc) (ref 1.9–3.7)
Glucose, Bld: 129 mg/dL — ABNORMAL HIGH (ref 65–99)
Potassium: 4.3 mmol/L (ref 3.5–5.3)
Sodium: 140 mmol/L (ref 135–146)
Total Bilirubin: 0.4 mg/dL (ref 0.2–1.2)
Total Protein: 7.3 g/dL (ref 6.1–8.1)

## 2021-02-16 LAB — CBC WITH DIFFERENTIAL/PLATELET
Absolute Monocytes: 492 cells/uL (ref 200–950)
Basophils Absolute: 42 cells/uL (ref 0–200)
Basophils Relative: 0.7 %
Eosinophils Absolute: 222 cells/uL (ref 15–500)
Eosinophils Relative: 3.7 %
HCT: 40.8 % (ref 35.0–45.0)
Hemoglobin: 13.3 g/dL (ref 11.7–15.5)
Lymphs Abs: 1674 cells/uL (ref 850–3900)
MCH: 29.2 pg (ref 27.0–33.0)
MCHC: 32.6 g/dL (ref 32.0–36.0)
MCV: 89.5 fL (ref 80.0–100.0)
MPV: 11.3 fL (ref 7.5–12.5)
Monocytes Relative: 8.2 %
Neutro Abs: 3570 cells/uL (ref 1500–7800)
Neutrophils Relative %: 59.5 %
Platelets: 205 10*3/uL (ref 140–400)
RBC: 4.56 10*6/uL (ref 3.80–5.10)
RDW: 14 % (ref 11.0–15.0)
Total Lymphocyte: 27.9 %
WBC: 6 10*3/uL (ref 3.8–10.8)

## 2021-02-16 LAB — HEMOGLOBIN A1C
Hgb A1c MFr Bld: 6.5 % of total Hgb — ABNORMAL HIGH (ref <5.7)
Mean Plasma Glucose: 140 mg/dL
eAG (mmol/L): 7.7 mmol/L

## 2021-02-16 LAB — TSH: TSH: 1.21 mIU/L (ref 0.40–4.50)

## 2021-02-19 DIAGNOSIS — M4180 Other forms of scoliosis, site unspecified: Secondary | ICD-10-CM | POA: Diagnosis not present

## 2021-02-19 DIAGNOSIS — M5136 Other intervertebral disc degeneration, lumbar region: Secondary | ICD-10-CM | POA: Diagnosis not present

## 2021-02-19 DIAGNOSIS — M5137 Other intervertebral disc degeneration, lumbosacral region: Secondary | ICD-10-CM | POA: Diagnosis not present

## 2021-02-19 DIAGNOSIS — M4316 Spondylolisthesis, lumbar region: Secondary | ICD-10-CM | POA: Diagnosis not present

## 2021-02-21 ENCOUNTER — Other Ambulatory Visit: Payer: Self-pay | Admitting: Neurosurgery

## 2021-02-21 DIAGNOSIS — M4316 Spondylolisthesis, lumbar region: Secondary | ICD-10-CM

## 2021-03-09 ENCOUNTER — Ambulatory Visit
Admission: RE | Admit: 2021-03-09 | Discharge: 2021-03-09 | Disposition: A | Discharge status: Home / Self Care | Payer: Medicare Other | Source: Ambulatory Visit | Attending: Neurosurgery | Admitting: Neurosurgery

## 2021-03-09 ENCOUNTER — Other Ambulatory Visit: Payer: Self-pay

## 2021-03-09 DIAGNOSIS — M545 Low back pain, unspecified: Secondary | ICD-10-CM | POA: Diagnosis not present

## 2021-03-09 DIAGNOSIS — M48061 Spinal stenosis, lumbar region without neurogenic claudication: Secondary | ICD-10-CM | POA: Diagnosis not present

## 2021-03-09 DIAGNOSIS — M4316 Spondylolisthesis, lumbar region: Secondary | ICD-10-CM

## 2021-03-12 DIAGNOSIS — M5126 Other intervertebral disc displacement, lumbar region: Secondary | ICD-10-CM | POA: Diagnosis not present

## 2021-03-12 DIAGNOSIS — M4180 Other forms of scoliosis, site unspecified: Secondary | ICD-10-CM | POA: Diagnosis not present

## 2021-03-15 ENCOUNTER — Ambulatory Visit: Payer: Medicare Other | Admitting: Nurse Practitioner

## 2021-03-17 ENCOUNTER — Other Ambulatory Visit: Payer: Self-pay | Admitting: Nurse Practitioner

## 2021-03-17 DIAGNOSIS — E119 Type 2 diabetes mellitus without complications: Secondary | ICD-10-CM

## 2021-03-18 ENCOUNTER — Other Ambulatory Visit: Payer: Self-pay

## 2021-03-18 ENCOUNTER — Ambulatory Visit (INDEPENDENT_AMBULATORY_CARE_PROVIDER_SITE_OTHER): Payer: Medicare Other | Admitting: Nurse Practitioner

## 2021-03-18 ENCOUNTER — Encounter: Payer: Self-pay | Admitting: Nurse Practitioner

## 2021-03-18 ENCOUNTER — Ambulatory Visit
Admission: RE | Admit: 2021-03-18 | Discharge: 2021-03-18 | Disposition: A | Discharge status: Home / Self Care | Payer: Medicare Other | Source: Ambulatory Visit | Attending: Nurse Practitioner | Admitting: Nurse Practitioner

## 2021-03-18 VITALS — BP 140/82 | HR 89 | Temp 96.9°F | Ht 63.0 in | Wt 271.0 lb

## 2021-03-18 DIAGNOSIS — R0789 Other chest pain: Secondary | ICD-10-CM

## 2021-03-18 DIAGNOSIS — I10 Essential (primary) hypertension: Secondary | ICD-10-CM | POA: Diagnosis not present

## 2021-03-18 DIAGNOSIS — G8929 Other chronic pain: Secondary | ICD-10-CM | POA: Diagnosis not present

## 2021-03-18 DIAGNOSIS — M5441 Lumbago with sciatica, right side: Secondary | ICD-10-CM | POA: Diagnosis not present

## 2021-03-18 DIAGNOSIS — M439 Deforming dorsopathy, unspecified: Secondary | ICD-10-CM | POA: Diagnosis not present

## 2021-03-18 NOTE — Progress Notes (Signed)
Careteam: Patient Care Team: Lauree Chandler, NP as PCP - General (Geriatric Medicine) Nicholas Lose, MD as Consulting Physician (Hematology and Oncology) Specialists, [place] as Consulting Physician (Orthopedic Surgery)  PLACE OF SERVICE:  [place] Directive information Does Patient Have a Medical Advance Directive?: Yes, Type of Advance Directive: [place];Out of facility DNR (pink MOST or yellow form), Pre-existing out of facility DNR order (yellow form or pink MOST form): Yellow form placed in chart (order not valid for inpatient use);Pink MOST form placed in chart (order not valid for inpatient use), Does patient want to make changes to medical advance directive?: No - Patient declined  Allergies  Allergen Reactions  . Actos [Pioglitazone] Swelling  . Hydrocodone Nausea Only and Other (See Comments)    "bloated"  . Codeine Rash  . Neurontin [Gabapentin] Palpitations and Other (See Comments)    dizziness  . Other Rash    Nuts--PECANS  . Peanut-Containing Drug Products Rash    Chief Complaint  Patient presents with  . Follow-up    4 week follow-up on blood pressure and pain. Patient c/o ongoing pain in right breast, yesterday pain 8/10     HPI: Patient is a 86 y.o. female for follow up on blood pressure and pain. She generally takes blood pressure medication around this time of day but has not taken today.  When she takes blood pressure at home sbp in the 130s after medication  No worsening LE edema- feels like overall improving.   Starting Cymbalta at last visit. Feels like it has helped. She had MRI and follow up with neurosurgery now she is going to pain management and planning to go this week.  No side effects noted on cymbalta.  Reports pain is not bad now.   Ongoing sharp pain to top of breast.  Reports she did fall on the right side during the winter, unsure if she hit her chest but started having pain after  that. Tylenol helps pain.  Review of Systems:  Review of Systems  Constitutional: Negative for chills, fever and weight loss.  HENT: Negative for tinnitus.   Respiratory: Negative for cough, sputum production and shortness of breath.   Cardiovascular: Positive for chest pain (upper chest on right, tender to touch). Negative for palpitations and leg swelling.  Gastrointestinal: Negative for abdominal pain, constipation, diarrhea and heartburn.  Genitourinary: Negative for dysuria, frequency and urgency.  Musculoskeletal: Positive for back pain and myalgias. Negative for falls and joint pain.  Skin: Negative.  Negative for itching and rash.  Neurological: Negative for dizziness and headaches.  Psychiatric/Behavioral: Negative for depression and memory loss. The patient does not have insomnia.     Past Medical History:  Diagnosis Date  . Allergy   . Arthritis   . Breast cancer ([place]) 10/24/13   right upper inner- DCIS  . Cancer ([place])   . Diabetes mellitus without complication ([place])   . GERD (gastroesophageal reflux disease)    occ  . Gout   . History of hiatal hernia   . HOH (hard of hearing)   . Hypertension   . Hypothyroidism   . Pneumonia    hx  . Thyroid disease   . Wears glasses    Past Surgical History:  Procedure Laterality Date  . BREAST BIOPSY Right 10/26/2013  . BREAST LUMPECTOMY Right 12/16/2013  . BREAST LUMPECTOMY WITH NEEDLE LOCALIZATION Right 12/16/2013   Procedure: BREAST LUMPECTOMY WITH NEEDLE LOCALIZATION;  Surgeon: Marland Kitchen T  Hoxworth, MD;  Location: [facility];  Service: General;  Laterality: Right;  . CATARACT EXTRACTION Left 12/30/2017  . CHOLECYSTECTOMY  1970  . COLONOSCOPY    . FINGER SURGERY     right hand-  . gallbladder sugery    . RE-EXCISION OF BREAST LUMPECTOMY Right 01/30/2014   Procedure: RE-EXCISION OF BREAST LUMPECTOMY;  Surgeon: Edward Jolly, MD;  Location: [facility];  Service: General;  Laterality:  Right;  . RE-EXCISION OF BREAST LUMPECTOMY Right 03/06/2014   Procedure: RE-EXCISION OF BREAST LUMPECTOMY;  Surgeon: Edward Jolly, MD;  Location: [facility];  Service: General;  Laterality: Right;  . SHOULDER ARTHROSCOPY  2011   left  . THYROID SURGERY  1970  . TOTAL SHOULDER ARTHROPLASTY Right 05/21/2016  . TOTAL SHOULDER ARTHROPLASTY Right 05/21/2016   Procedure: RIGHT TOTAL SHOULDER ARTHROPLASTY;  Surgeon: Ninetta Lights, MD;  Location: [facility];  Service: Orthopedics;  Laterality: Right;  . TUBAL LIGATION     Social History:   reports that she has never smoked. She has never used smokeless tobacco. She reports that she does not drink alcohol and does not use drugs.  Family History  Problem Relation Age of Onset  . Diabetes Mother   . Arthritis Father   . Seizures Sister   . Colon cancer Sister   . Pneumonia Daughter   . Cancer Sister        breast  . Breast cancer Sister   . Multiple sclerosis Son        Deceased     Medications: Patient's Medications  New Prescriptions   No medications on file  Previous Medications   ACETAMINOPHEN (TYLENOL) 500 MG TABLET    Take 500 mg by mouth as needed.   ALLOPURINOL (ZYLOPRIM) 100 MG TABLET    TAKE 1 TABLET BY MOUTH EVERY DAY FOR GOUT   AMLODIPINE (NORVASC) 10 MG TABLET    Take 1 tablet (10 mg total) by mouth daily.   ATORVASTATIN (LIPITOR) 10 MG TABLET    Take 1 tablet (10 mg total) by mouth daily.   CALCIUM-VITAMIN D PO    Take by mouth.   DICLOFENAC SODIUM (VOLTAREN) 1 % GEL    Apply 4 g topically 4 (four) times daily as needed.   DULOXETINE (CYMBALTA) 20 MG CAPSULE    Take 1 capsule (20 mg total) by mouth daily.   GLIPIZIDE (GLUCOTROL XL) 5 MG 24 HR TABLET    TAKE 1 TABLET BY MOUTH EVERY DAY WITH BREAKFAST   LEVOTHYROXINE (SYNTHROID) 100 MCG TABLET    TAKE 1 TABLET BY MOUTH EVERY DAY 30 MINUTES BEFORE BREAKFAST ON AN EMPTY STOMACH   LOSARTAN (COZAAR) 50 MG TABLET    TAKE 1 TABLET(50 MG) BY MOUTH DAILY    TRIAMCINOLONE CREAM (KENALOG) 0.1 %    APPLY EXTERNALLY TO THE AFFECTED AREA TWICE DAILY  Modified Medications   No medications on file  Discontinued Medications   No medications on file    Physical Exam:  Vitals:   03/18/21 1010  BP: 140/82  Pulse: 89  Temp: (!) 96.9 F (36.1 C)  TempSrc: Temporal  SpO2: 96%  Weight: 271 lb (122.9 kg)  Height: 5\' 3"  (1.6 m)   Body mass index is 48.01 kg/m. Wt Readings from Last 3 Encounters:  03/18/21 271 lb (122.9 kg)  02/15/21 275 lb 12.8 oz (125.1 kg)  10/08/20 274 lb (124.3 kg)    Physical Exam Constitutional:  General: She is not in acute distress.    Appearance: She is well-developed. She is not diaphoretic.  HENT:     Head: Normocephalic and atraumatic.     Mouth/Throat:     Pharynx: No oropharyngeal exudate.  Eyes:     Conjunctiva/sclera: Conjunctivae normal.     Pupils: Pupils are equal, round, and reactive to light.  Cardiovascular:     Rate and Rhythm: Normal rate and regular rhythm.     Heart sounds: Normal heart sounds.  Pulmonary:     Effort: Pulmonary effort is normal.     Breath sounds: Normal breath sounds.  Chest:     Chest wall: Tenderness present.  Breasts:     Right: No axillary adenopathy or supraclavicular adenopathy.     Abdominal:     General: Bowel sounds are normal.     Palpations: Abdomen is soft.  Musculoskeletal:        General: No tenderness.     Cervical back: Normal range of motion and neck supple.  Lymphadenopathy:     Upper Body:     Right upper body: No supraclavicular, axillary or pectoral adenopathy.  Skin:    General: Skin is warm and dry.  Neurological:     Mental Status: She is alert and oriented to person, place, and time.     Labs reviewed: Basic Metabolic Panel: Recent Labs    06/06/20 1512 10/12/20 1140 02/15/21 1154  NA 143 141 140  K 5.0 4.4 4.3  CL 108 107 107  CO2 26 25 26   GLUCOSE 97 104* 129*  BUN 19 22 15   CREATININE 1.18* 1.29* 1.23*  CALCIUM  9.9 9.8 10.1  TSH 0.54  --  1.21   Liver Function Tests: Recent Labs    06/06/20 1512 10/12/20 1140 02/15/21 1154  AST 16 15 19   ALT 10 13 15   BILITOT 0.5 0.3 0.4  PROT 6.8 7.0 7.3   No results for input(s): LIPASE, AMYLASE in the last 8760 hours. No results for input(s): AMMONIA in the last 8760 hours. CBC: Recent Labs    06/06/20 1512 10/12/20 1140 02/15/21 1154  WBC 5.6 6.4 6.0  NEUTROABS 2,951 3,917 3,570  HGB 12.7 12.9 13.3  HCT 38.9 39.3 40.8  MCV 90.0 89.1 89.5  PLT 196 207 205   Lipid Panel: Recent Labs    10/12/20 1140  CHOL 182  HDL 55  LDLCALC 105*  TRIG 119  CHOLHDL 3.3   TSH: Recent Labs    06/06/20 1512 02/15/21 1154  TSH 0.54 1.21   A1C: Lab Results  Component Value Date   HGBA1C 6.5 (H) 02/15/2021     Assessment/Plan 1. Chest wall tenderness -ongoing tenderness to chest wall and upper breast area. No rash or lesions noted.  - DG Chest 2 View; Future for further evaluation -continue to use heating pain and tylenol PRN.   2. Chronic bilateral low back pain with right-sided sciatica -continues on cymbalta which has helped improved pain. Will continue at this time. She has follow up with neurosurgery as well to help with pain management.   3. Essential hypertension, benign -elevated today in office but has not had medication, education provided on taking medication prior to office visit. Encouraged low sodium diet. To continue norvasc 10 mg daily   Next appt: 06/19/2021 Carlos American. [place], [facility] [phone]

## 2021-03-18 NOTE — Patient Instructions (Addendum)
To take tylenol 500 mg 2 tablets every 8 hours as needed pain.   Continue on Cymbalta 20 mg daily   Make sure to take medication prior to appts so we can check blood pressure.  GOAL BP <140/90.

## 2021-03-21 DIAGNOSIS — M5126 Other intervertebral disc displacement, lumbar region: Secondary | ICD-10-CM | POA: Diagnosis not present

## 2021-03-21 DIAGNOSIS — M48062 Spinal stenosis, lumbar region with neurogenic claudication: Secondary | ICD-10-CM | POA: Diagnosis not present

## 2021-03-31 ENCOUNTER — Other Ambulatory Visit: Payer: Self-pay | Admitting: Nurse Practitioner

## 2021-03-31 DIAGNOSIS — E785 Hyperlipidemia, unspecified: Secondary | ICD-10-CM

## 2021-04-11 ENCOUNTER — Other Ambulatory Visit: Payer: Self-pay | Admitting: Nurse Practitioner

## 2021-04-11 DIAGNOSIS — E785 Hyperlipidemia, unspecified: Secondary | ICD-10-CM

## 2021-04-17 DIAGNOSIS — M48062 Spinal stenosis, lumbar region with neurogenic claudication: Secondary | ICD-10-CM | POA: Diagnosis not present

## 2021-04-17 DIAGNOSIS — I10 Essential (primary) hypertension: Secondary | ICD-10-CM | POA: Diagnosis not present

## 2021-04-29 ENCOUNTER — Other Ambulatory Visit: Payer: Self-pay

## 2021-04-29 ENCOUNTER — Ambulatory Visit (INDEPENDENT_AMBULATORY_CARE_PROVIDER_SITE_OTHER): Payer: Medicare Other | Admitting: Family

## 2021-04-29 ENCOUNTER — Encounter: Payer: Self-pay | Admitting: Family

## 2021-04-29 VITALS — BP 160/80 | HR 100 | Temp 96.9°F | Resp 18 | Ht 63.0 in | Wt 264.4 lb

## 2021-04-29 DIAGNOSIS — N939 Abnormal uterine and vaginal bleeding, unspecified: Secondary | ICD-10-CM | POA: Diagnosis not present

## 2021-04-29 LAB — CBC WITH DIFFERENTIAL/PLATELET
Absolute Monocytes: 812 cells/uL (ref 200–950)
Basophils Absolute: 53 cells/uL (ref 0–200)
Basophils Relative: 0.8 %
Eosinophils Absolute: 191 cells/uL (ref 15–500)
Eosinophils Relative: 2.9 %
HCT: 40.5 % (ref 35.0–45.0)
Hemoglobin: 12.9 g/dL (ref 11.7–15.5)
Lymphs Abs: 1775 cells/uL (ref 850–3900)
MCH: 28.5 pg (ref 27.0–33.0)
MCHC: 31.9 g/dL — ABNORMAL LOW (ref 32.0–36.0)
MCV: 89.6 fL (ref 80.0–100.0)
MPV: 11 fL (ref 7.5–12.5)
Monocytes Relative: 12.3 %
Neutro Abs: 3769 cells/uL (ref 1500–7800)
Neutrophils Relative %: 57.1 %
Platelets: 222 10*3/uL (ref 140–400)
RBC: 4.52 10*6/uL (ref 3.80–5.10)
RDW: 14 % (ref 11.0–15.0)
Total Lymphocyte: 26.9 %
WBC: 6.6 10*3/uL (ref 3.8–10.8)

## 2021-04-29 NOTE — Progress Notes (Signed)
Provider: Zahari Fazzino FNP-C  Lauree Chandler, NP  Patient Care Team: Lauree Chandler, NP as PCP - General (Geriatric Medicine) Nicholas Lose, MD as Consulting Physician (Hematology and Oncology) [facility] [place] as Consulting Physician (Orthopedic Surgery)  Extended Emergency Contact Information Primary Emergency Contact: Liona, [place], [place] [address] [name] of [place] Phone: [phone] Relation: Daughter Secondary Emergency Contact: [name] Address: [address]          [place], [place] [address] [place] Phone: [phone] Relation: Daughter  Code Status:  DNR Goals of care: Advanced Directive information Advanced Directives 04/29/2021  Does Patient Have a Medical Advance Directive? Yes  Type of Paramedic of [place];Out of facility DNR (pink MOST or yellow form)  Does patient want to make changes to medical advance directive? No - Patient declined  Copy of [place] in Chart? Yes - validated most recent copy scanned in chart (See row information)  Pre-existing out of facility DNR order (yellow form or pink MOST form) -     Chief Complaint  Patient presents with   Acute Visit    Complains of vaginal bleeding since Friday.    HPI:  Pt is a 86 y.o. female seen today for an acute visit for evaluation of vaginal bleeding x 4 days.Has used one pack of pull diapers since bleeding started.Has used two today.states feels weak today.bleeding is described as periods with dark-bright red blood.  Has had some slight abdominal pain.denies any fever,chills,nausea,vomiting,bloating or abdominal distention.    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   Cancer ([facility])    Diabetes mellitus without complication (HCC)    GERD (gastroesophageal reflux disease)    occ   Gout     History of hiatal hernia    HOH (hard of hearing)    Hypertension    Hypothyroidism    Pneumonia    hx   Thyroid disease    Wears glasses    Past Surgical History:  Procedure Laterality Date   BREAST BIOPSY Right 10/26/2013   BREAST LUMPECTOMY Right 12/16/2013   BREAST LUMPECTOMY WITH NEEDLE LOCALIZATION Right 12/16/2013   Procedure: BREAST LUMPECTOMY WITH NEEDLE LOCALIZATION;  Surgeon: Edward Jolly, MD;  Location: [facility];  Service: General;  Laterality: Right;   CATARACT EXTRACTION Left 12/30/2017   CHOLECYSTECTOMY  1970   COLONOSCOPY     FINGER SURGERY     right hand-   gallbladder sugery     RE-EXCISION OF BREAST LUMPECTOMY Right 01/30/2014   Procedure: RE-EXCISION OF BREAST LUMPECTOMY;  Surgeon: Edward Jolly, MD;  Location: [facility];  Service: General;  Laterality: Right;   RE-EXCISION OF BREAST LUMPECTOMY Right 03/06/2014   Procedure: RE-EXCISION OF BREAST LUMPECTOMY;  Surgeon: Edward Jolly, MD;  Location: [facility];  Service: General;  Laterality: Right;   SHOULDER ARTHROSCOPY  2011   left   THYROID SURGERY  1970   TOTAL SHOULDER ARTHROPLASTY Right 05/21/2016   TOTAL SHOULDER ARTHROPLASTY Right 05/21/2016   Procedure: RIGHT TOTAL SHOULDER ARTHROPLASTY;  Surgeon: Ninetta Lights, MD;  Location: [facility];  Service: Orthopedics;  Laterality: Right;   TUBAL LIGATION      Allergies  Allergen Reactions   Actos [Pioglitazone] Swelling   Hydrocodone Nausea  Only and Other (See Comments)    "bloated"   Codeine Rash   Neurontin [Gabapentin] Palpitations and Other (See Comments)    dizziness   Other Rash    Nuts--PECANS   Peanut-Containing Drug Products Rash    Outpatient Encounter Medications as of 04/29/2021  Medication Sig   acetaminophen (TYLENOL) 500 MG tablet Take 500 mg by mouth as needed.   allopurinol (ZYLOPRIM) 100 MG tablet TAKE 1 TABLET BY MOUTH EVERY DAY FOR GOUT   amLODipine (NORVASC) 10 MG  tablet Take 1 tablet (10 mg total) by mouth daily.   atorvastatin (LIPITOR) 10 MG tablet TAKE 1 TABLET(10 MG) BY MOUTH DAILY   CALCIUM-VITAMIN D PO Take by mouth.   diclofenac sodium (VOLTAREN) 1 % GEL Apply 4 g topically 4 (four) times daily as needed.   DULoxetine (CYMBALTA) 20 MG capsule Take 1 capsule (20 mg total) by mouth daily.   glipiZIDE (GLUCOTROL XL) 5 MG 24 hr tablet TAKE 1 TABLET BY MOUTH EVERY DAY WITH BREAKFAST   levothyroxine (SYNTHROID) 100 MCG tablet TAKE 1 TABLET BY MOUTH EVERY DAY 30 MINUTES BEFORE BREAKFAST ON AN EMPTY STOMACH   losartan (COZAAR) 50 MG tablet TAKE 1 TABLET(50 MG) BY MOUTH DAILY   triamcinolone cream (KENALOG) 0.1 % APPLY EXTERNALLY TO THE AFFECTED AREA TWICE DAILY   No facility-administered encounter medications on file as of 04/29/2021.    Review of Systems  Constitutional:  Negative for appetite change, chills, fatigue, fever and unexpected weight change.  HENT:  Negative for trouble swallowing.   Eyes:  Negative for pain, discharge, redness, itching and visual disturbance.  Respiratory:  Negative for cough, chest tightness, shortness of breath and wheezing.   Cardiovascular:  Negative for chest pain, palpitations and leg swelling.  Gastrointestinal:  Negative for abdominal distention, blood in stool, constipation, diarrhea, nausea and vomiting.       Slight abd pain   Genitourinary:  Positive for vaginal bleeding. Negative for difficulty urinating, dysuria, flank pain, frequency, pelvic pain, urgency, vaginal discharge and vaginal pain.       Incontinent   Skin:  Negative for color change, pallor, rash and wound.  Neurological:  Negative for dizziness, syncope, speech difficulty, weakness, light-headedness, numbness and headaches.  Hematological:  Does not bruise/bleed easily.   Immunization History  Administered Date(s) Administered   Fluad Quad(high Dose 65+) 10/08/2020   Influenza, High Dose Seasonal PF 09/01/2019   Influenza,inj,Quad PF,6+  Mos 09/07/2015, 07/25/2016, 09/22/2017   Influenza-Unspecified 08/10/2014, 10/04/2018   PFIZER(Purple Top)SARS-COV-2 Vaccination 12/17/2019, 01/07/2020, 12/25/2020   Pneumococcal Conjugate-13 01/14/2017   Pneumococcal-Unspecified 11/10/2013   Tdap 04/18/2016   Zoster, Live 04/18/2012   Pertinent  Health Maintenance Due  Topic Date Due   OPHTHALMOLOGY EXAM  09/27/2020   INFLUENZA VACCINE  06/10/2021   HEMOGLOBIN A1C  08/17/2021   DEXA SCAN  01/31/2022   FOOT EXAM  02/15/2022   PNA vac Low Risk Adult  Completed   Fall Risk  04/29/2021 03/18/2021 02/15/2021 01/22/2021 11/19/2020  Falls in the past year? 0 1 1 1  0  Number falls in past yr: 0 0 0 1 0  Injury with Fall? 0 0 0 0 0  Risk for fall due to : No Fall Risks - - - -   Functional Status Survey:    Vitals:   04/29/21 1417  BP: (!) 160/80  Pulse: 100  Resp: 18  Temp: (!) 96.9 F (36.1 C)  SpO2: 97%  Weight: 264 lb 6.4 oz (119.9 kg)  Height:  5\' 3"  (1.6 m)   Body mass index is 46.84 kg/m. Physical Exam Vitals reviewed. Exam conducted with a chaperone present (dillard Jasmine,CMA).  Constitutional:      General: She is not in acute distress.    Appearance: Normal appearance. She is morbidly obese. She is not ill-appearing or diaphoretic.  HENT:     Nose: Rhinorrhea present.     Mouth/Throat:     Mouth: Mucous membranes are moist.     Pharynx: Oropharynx is clear. No oropharyngeal exudate or posterior oropharyngeal erythema.  Eyes:     General: No scleral icterus.       Right eye: No discharge.        Left eye: No discharge.     Extraocular Movements: Extraocular movements intact.     Conjunctiva/sclera: Conjunctivae normal.     Pupils: Pupils are equal, round, and reactive to light.  Cardiovascular:     Rate and Rhythm: Normal rate and regular rhythm.     Pulses: Normal pulses.     Heart sounds: Normal heart sounds. No murmur heard.   No friction rub. No gallop.  Pulmonary:     Effort: Pulmonary effort is normal.  No respiratory distress.     Breath sounds: Normal breath sounds. No wheezing, rhonchi or rales.  Chest:     Chest wall: No tenderness.  Abdominal:     General: Bowel sounds are normal. There is no distension.     Palpations: Abdomen is soft. There is no mass.     Tenderness: There is no abdominal tenderness. There is no right CVA tenderness, left CVA tenderness, guarding or rebound.  Genitourinary:    Exam position: Lithotomy position.     Labia:        Right: No tenderness, lesion or injury.        Left: No tenderness, lesion or injury.      Urethra: No prolapse, urethral pain, urethral swelling or urethral lesion.     Vagina: Bleeding present. No vaginal discharge, erythema or tenderness.     Cervix: No discharge, friability, lesion, erythema or cervical bleeding.     Adnexa:        Right: No mass or tenderness.         Left: No mass or tenderness.    Lymphadenopathy:     Lower Body: No right inguinal adenopathy. No left inguinal adenopathy.  Skin:    General: Skin is warm and dry.     Coloration: Skin is not pale.     Findings: No bruising, erythema, lesion or rash.  Neurological:     Mental Status: She is alert and oriented to person, place, and time.     Motor: No weakness.     Gait: Gait normal.  Psychiatric:        Mood and Affect: Mood normal.        Speech: Speech normal.        Behavior: Behavior normal.        Thought Content: Thought content normal.        Judgment: Judgment normal.    Labs reviewed: Recent Labs    06/06/20 1512 10/12/20 1140 02/15/21 1154  NA 143 141 140  K 5.0 4.4 4.3  CL 108 107 107  CO2 26 25 26   GLUCOSE 97 104* 129*  BUN 19 22 15   CREATININE 1.18* 1.29* 1.23*  CALCIUM 9.9 9.8 10.1   Recent Labs    06/06/20 1512 10/12/20 1140 02/15/21 1154  AST 16  15 19  ALT 10 13 15   BILITOT 0.5 0.3 0.4  PROT 6.8 7.0 7.3   Recent Labs    06/06/20 1512 10/12/20 1140 02/15/21 1154  WBC 5.6 6.4 6.0  NEUTROABS 2,951 3,917 3,570  HGB  12.7 12.9 13.3  HCT 38.9 39.3 40.8  MCV 90.0 89.1 89.5  PLT 196 207 205   Lab Results  Component Value Date   TSH 1.21 02/15/2021   Lab Results  Component Value Date   HGBA1C 6.5 (H) 02/15/2021   Lab Results  Component Value Date   CHOL 182 10/12/2020   HDL 55 10/12/2020   LDLCALC 105 (H) 10/12/2020   TRIG 119 10/12/2020   CHOLHDL 3.3 10/12/2020    Significant Diagnostic Results in last 30 days:  No results found.  Assessment/Plan  Vaginal bleeding Moderate amounts of dark red blood noted from cervix.no friability or discharge noted.No adnexa tenderness on palpation. - will check CBC/diff to rule out anemia and acute infectious etiology - will obtain urgent referral to gynecology for further evaluation. - advised to notify provider or go to ED if symptoms worsen.  - CBC with Differential/Platelet - Ambulatory referral to Gynecology    Family/ staff Communication: Reviewed plan of care with patient verbalized understanding   Labs/tests ordered: - CBC with Differential/Platelet  Next Appointment: Has up coming appointment with PCP already.   Sandrea Hughs, NP

## 2021-04-29 NOTE — Patient Instructions (Signed)
Urgent Referral ordered to see gynecology for vaginal bleeding   Notify provider or go to ED if vaginal bleeding worsen

## 2021-04-30 ENCOUNTER — Ambulatory Visit: Payer: Medicare Other | Admitting: Nurse Practitioner

## 2021-04-30 VITALS — BP 130/72 | HR 111 | Ht 63.0 in | Wt 263.0 lb

## 2021-04-30 DIAGNOSIS — N950 Postmenopausal bleeding: Secondary | ICD-10-CM

## 2021-04-30 MED ORDER — MEDROXYPROGESTERONE ACETATE 10 MG PO TABS: 10.0000 mg | ORAL_TABLET | Freq: Every day | ORAL | 0 refills | Status: DC

## 2021-04-30 NOTE — Progress Notes (Signed)
   Acute Office Visit  Subjective:    Patient ID: Karla Willis, female    DOB: 09-10-1935, 86 y.o.   MRN: [id number]   HPI 86 y.o. presents today for postmenopausal bleeding. She was seen yesterday by Geriatric medicine with complaints of bleeding x 4 days that required using one pack of pull up diapers since bleeding started. Clots present first day, still bleeding today. Normal CBC. Had benign endometrial biopsy 11/2019 when seen for PMB at that time. She was on Tamoxifen x 5 years, stopping in 04/2019. An ultrasound was recommended but was never completed by patient. Normal pap history.    Review of Systems  Constitutional: Negative.   Gastrointestinal: Negative.   Genitourinary:  Positive for vaginal bleeding.      Objective:    Physical Exam Constitutional:      Appearance: Normal appearance. She is obese.  Abdominal:     Tenderness: There is no abdominal tenderness.  Genitourinary:    General: Normal vulva.     Vagina: Bleeding present.     Cervix: Cervical bleeding present.     Uterus: Normal.     BP 130/72   Pulse (!) 111   Ht 5\' 3"  (1.6 m)   Wt 263 lb (119.3 kg)   SpO2 98%   BMI 46.59 kg/m  Wt Readings from Last 3 Encounters:  04/30/21 263 lb (119.3 kg)  04/29/21 264 lb 6.4 oz (119.9 kg)  03/18/21 271 lb (122.9 kg)        Assessment & Plan:   Problem List Items Addressed This Visit   None Visit Diagnoses     Postmenopausal bleeding    -  Primary   Relevant Medications   medroxyPROGESTERone (PROVERA) 10 MG tablet   Other Relevant Orders   US PELVIS TRANSVAGINAL NON-OB (TV ONLY)      Plan: We will schedule pelvic ultrasound for further evaluation. Provera daily x 10 days to help stop/slow bleeding. She is agreeable to plan.      Tamela Gammon DNP, 12:23 PM 04/30/2021

## 2021-05-16 ENCOUNTER — Telehealth: Payer: Self-pay | Admitting: *Deleted

## 2021-05-16 ENCOUNTER — Other Ambulatory Visit: Payer: Self-pay | Admitting: Nurse Practitioner

## 2021-05-16 DIAGNOSIS — N950 Postmenopausal bleeding: Secondary | ICD-10-CM

## 2021-05-16 MED ORDER — MEDROXYPROGESTERONE ACETATE 10 MG PO TABS: 10.0000 mg | ORAL_TABLET | Freq: Every day | ORAL | 0 refills | Status: DC

## 2021-05-16 NOTE — Telephone Encounter (Signed)
Patient is scheduled for pelvic ultrasound on 05/23/21, reports the bleeding started again this am when she went to the bathroom. She had an office visit on 04/30/21 and was prescribed provera 10 mg tablet 1 po daily for 10 days and the bleeding did stop. Patient asked if refill on Rx could be given? Please advise

## 2021-05-16 NOTE — Telephone Encounter (Signed)
Patient informed with below note. 

## 2021-05-16 NOTE — Telephone Encounter (Signed)
Refill sent   Thank you

## 2021-05-23 ENCOUNTER — Other Ambulatory Visit: Payer: Self-pay

## 2021-05-23 ENCOUNTER — Ambulatory Visit: Payer: Medicare Other | Admitting: Obstetrics and Gynecology

## 2021-05-23 ENCOUNTER — Ambulatory Visit (INDEPENDENT_AMBULATORY_CARE_PROVIDER_SITE_OTHER): Payer: Medicare Other

## 2021-05-23 VITALS — BP 136/86

## 2021-05-23 DIAGNOSIS — N950 Postmenopausal bleeding: Secondary | ICD-10-CM

## 2021-05-23 DIAGNOSIS — R9389 Abnormal findings on diagnostic imaging of other specified body structures: Secondary | ICD-10-CM | POA: Diagnosis not present

## 2021-05-23 NOTE — Progress Notes (Signed)
GYNECOLOGY  VISIT   HPI: 86 y.o.   Widowed  [place]  female   [phone] with No LMP recorded. Patient is postmenopausal.   here for   U/S check for heavy postmenopausal bleeding June.   Hx tamoxifen use for 5 years but not currently.   She was treated with Provera 10 mg x 10 days with the recent episode of bleeding.  The bleeding stopped during the medication course, and she had a withdrawal bleed with discontinuation.  She called back in to the office and received a refill of more Provera 10 mg. Bleeding has stopped again, and she has three more pills to take.   No pain.   Hgb 12.9 on 04/29/21.  Hx benign endometrial polyp on EMB in 2021. Report reviewed.  She did not have the recommended pelvic ultrasound recommended at that time.   GYNECOLOGIC HISTORY: No LMP recorded. Patient is postmenopausal. Contraception:  none Menopausal hormone therapy:  none Last mammogram:  05-16-20 Last pap smear:   11-15-19 normal.         OB History     Gravida  5   Para  4   Term      Preterm      AB  1   Living  3      SAB      IAB  1   Ectopic      Multiple      Live Births                 Patient Active Problem List   Diagnosis Date Noted   Snoring 02/14/2020   Insomnia 02/14/2020   History of breast cancer 05/20/2017   Idiopathic gout 05/20/2017   Hyperlipidemia LDL goal <100 05/20/2017   Bilateral edema of lower extremity 05/20/2017   Acute renal failure (ARF) (HCC)    Acute on chronic renal failure ([facility]) 05/25/2016   ARF (acute renal failure) ([facility]) 05/25/2016   Dyspnea on exertion 05/25/2016   Anemia 05/25/2016   Localized primary osteoarthritis of right shoulder region 05/21/2016   Osteoarthritis, multiple sites 03/09/2015   Hypothyroidism 03/09/2015   Goiter 03/09/2015   Essential hypertension, benign 03/09/2015   Diabetes mellitus, type 2 ([facility]) 03/09/2015   DCIS (ductal carcinoma in situ) of breast 11/11/2013    Past Medical History:   Diagnosis Date   Allergy    Arthritis    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   Cancer ([facility])    Diabetes mellitus without complication (HCC)    GERD (gastroesophageal reflux disease)    occ   Gout    History of hiatal hernia    HOH (hard of hearing)    Hypertension    Hypothyroidism    Pneumonia    hx   Thyroid disease    Wears glasses     Past Surgical History:  Procedure Laterality Date   BREAST BIOPSY Right 10/26/2013   BREAST LUMPECTOMY Right 12/16/2013   BREAST LUMPECTOMY WITH NEEDLE LOCALIZATION Right 12/16/2013   Procedure: BREAST LUMPECTOMY WITH NEEDLE LOCALIZATION;  Surgeon: Edward Jolly, MD;  Location: [facility];  Service: General;  Laterality: Right;   CATARACT EXTRACTION Left 12/30/2017   CHOLECYSTECTOMY  1970   COLONOSCOPY     FINGER SURGERY     right hand-   gallbladder sugery     RE-EXCISION OF BREAST LUMPECTOMY Right 01/30/2014   Procedure: RE-EXCISION OF BREAST LUMPECTOMY;  Surgeon: Edward Jolly, MD;  Location: [facility];  Service: General;  Laterality: Right;   RE-EXCISION OF BREAST LUMPECTOMY Right 03/06/2014   Procedure: RE-EXCISION OF BREAST LUMPECTOMY;  Surgeon: Edward Jolly, MD;  Location: [facility];  Service: General;  Laterality: Right;   SHOULDER ARTHROSCOPY  2011   left   THYROID SURGERY  1970   TOTAL SHOULDER ARTHROPLASTY Right 05/21/2016   TOTAL SHOULDER ARTHROPLASTY Right 05/21/2016   Procedure: RIGHT TOTAL SHOULDER ARTHROPLASTY;  Surgeon: Ninetta Lights, MD;  Location: [facility];  Service: Orthopedics;  Laterality: Right;   TUBAL LIGATION      Current Outpatient Medications  Medication Sig Dispense Refill   acetaminophen (TYLENOL) 500 MG tablet Take 500 mg by mouth as needed.     allopurinol (ZYLOPRIM) 100 MG tablet TAKE 1 TABLET BY MOUTH EVERY DAY FOR GOUT 90 tablet 1   amLODipine (NORVASC) 10 MG tablet Take 1 tablet (10 mg total) by mouth daily. 90 tablet  3   atorvastatin (LIPITOR) 10 MG tablet TAKE 1 TABLET(10 MG) BY MOUTH DAILY 90 tablet 1   CALCIUM-VITAMIN D PO Take by mouth.     diclofenac sodium (VOLTAREN) 1 % GEL Apply 4 g topically 4 (four) times daily as needed. 100 g 0   DULoxetine (CYMBALTA) 20 MG capsule Take 1 capsule (20 mg total) by mouth daily. 30 capsule 3   glipiZIDE (GLUCOTROL XL) 5 MG 24 hr tablet TAKE 1 TABLET BY MOUTH EVERY DAY WITH BREAKFAST 90 tablet 1   levothyroxine (SYNTHROID) 100 MCG tablet TAKE 1 TABLET BY MOUTH EVERY DAY 30 MINUTES BEFORE BREAKFAST ON AN EMPTY STOMACH 90 tablet 1   losartan (COZAAR) 50 MG tablet TAKE 1 TABLET(50 MG) BY MOUTH DAILY 90 tablet 1   medroxyPROGESTERone (PROVERA) 10 MG tablet Take 1 tablet (10 mg total) by mouth daily. 10 tablet 0   triamcinolone cream (KENALOG) 0.1 % APPLY EXTERNALLY TO THE AFFECTED AREA TWICE DAILY 45 g 3   No current facility-administered medications for this visit.     ALLERGIES: Actos [pioglitazone], Hydrocodone, Codeine, Neurontin [gabapentin], Other, and Peanut-containing drug products  Family History  Problem Relation Age of Onset   Diabetes Mother    Arthritis Father    Seizures Sister    Colon cancer Sister    Pneumonia Daughter    Cancer Sister        breast   Breast cancer Sister    Multiple sclerosis Son        Deceased     Social History   Socioeconomic History   Marital status: Widowed    Spouse name: Not on file   Number of children: Not on file   Years of education: Not on file   Highest education level: Not on file  Occupational History   Not on file  Tobacco Use   Smoking status: Never   Smokeless tobacco: Never  Vaping Use   Vaping Use: Never used  Substance and Sexual Activity   Alcohol use: No    Alcohol/week: 0.0 standard drinks   Drug use: No   Sexual activity: Not Currently    Comment: menarche age 46,1st intercourse 86 yo-Fewer than 5 partners  Other Topics Concern   Not on file  Social History Narrative   Diet: No       Do you drink/ eat things with caffeine? Yes      Marital status:    Widowed  What year were you married ?  1956      Do you live in a house, apartment,assistred living, condo, trailer, etc.)?  apartment      Is it one or more stories?  one      How many persons live in your home ?  3      Do you have any pets in your home ?(please list)  No      Current or past profession:  Bank, Caregiver      Do you exercise?  No                            Type & how often:        Do you have a living will?  Yes      Do you have a DNR form?  Yes                     If not, do you want to discuss one?       Do you have signed POA?HPOA forms?                 If so, please bring to your        appointment      Social Determinants of Health   Financial Resource Strain: Not on file  Food Insecurity: Not on file  Transportation Needs: Not on file  Physical Activity: Not on file  Stress: Not on file  Social Connections: Not on file  Intimate Partner Violence: Not on file    Review of Systems  See HPI.  PHYSICAL EXAMINATION:    BP 136/86 (BP Location: Left Arm, Patient Position: Sitting, Cuff Size: Large)     General appearance: alert, cooperative and appears stated age   Pelvic US  Uterus - 9.62 x 4.99 x 4.85 cm.  3.29 cm intramural fibroid with calcifications.  EMS 10.70 mm, echogenic, cannot rule out mass.  Right ovary viewed with difficulty.  Bowel overlying.   Left ovary normal.  No adnexal mass. No free fluid.   ASSESSMENT  Postmenopausal bleeding.  Thickened endometrium.  Intramural fibroid. Hx benign endometrial polyp on endometrial biopsy, not removed in 2021.  Hx ductal CIS right breast.  Off tamoxifen.   PLAN  We reviewed her pelvic ultrasound findings and images.  Endometrial biopsy recommended.  She will return to do this on 05/27/21.  It is late in the afternoon and the patient does not have a support person with her.  I anticipate  surgical care following result of the biopsy.

## 2021-05-24 ENCOUNTER — Other Ambulatory Visit: Payer: Self-pay | Admitting: *Deleted

## 2021-05-24 DIAGNOSIS — N950 Postmenopausal bleeding: Secondary | ICD-10-CM

## 2021-05-26 ENCOUNTER — Encounter: Payer: Self-pay | Admitting: Obstetrics and Gynecology

## 2021-05-26 ENCOUNTER — Other Ambulatory Visit: Payer: Self-pay | Admitting: Nurse Practitioner

## 2021-05-26 DIAGNOSIS — N950 Postmenopausal bleeding: Secondary | ICD-10-CM

## 2021-05-26 NOTE — Patient Instructions (Signed)
Endometrial Biopsy An endometrial biopsy is a procedure to remove tissue samples from the endometrium, which is the lining of the uterus. The tissue that is removed can then be checked under a microscope for disease. This procedure is used to diagnose conditions such as endometrial cancer, endometrial tuberculosis, polyps, or other inflammatory conditions. This procedure may also be used to investigate uterine bleeding to determine where you are in your menstrual cycle or how your hormone levels are affecting the lining of the uterus. Tell a health care provider about: Any allergies you have. All medicines you are taking, including vitamins, herbs, eye drops, creams, and over-the-counter medicines. Any problems you or family members have had with anesthetic medicines. Any blood disorders you have. Any surgeries you have had. Any medical conditions you have. Whether you are pregnant or may be pregnant. What are the risks? Generally, this is a safe procedure. However, problems may occur, including: Bleeding. Pelvic infection. Puncture of the wall of the uterus with the biopsy device (rare). Allergic reactions to medicines. What happens before the procedure? Keep a record of your menstrual cycles as told by your health care provider. You may need to schedule your procedure for a specific time in your cycle. You may want to bring a sanitary pad to wear after the procedure. Plan to have someone take you home from the hospital or clinic. Ask your health care provider about: Changing or stopping your regular medicines. This is especially important if you are taking diabetes medicines, arthritis medicines, or blood thinners. Taking medicines such as aspirin and ibuprofen. These medicines can thin your blood. Do not take these medicines unless your health care provider tells you to take them. Taking over-the-counter medicines, vitamins, herbs, and supplements. What happens during the procedure? You  will lie on an exam table with your feet and legs supported as in a pelvic exam. Your health care provider will insert an instrument (speculum) into your vagina to see your cervix. Your cervix will be cleansed with an antiseptic solution. A medicine (local anesthetic) will be used to numb the cervix. A forceps instrument (tenaculum) will be used to hold your cervix steady for the biopsy. A thin, rod-like instrument (uterine sound) will be inserted through your cervix to determine the length of your uterus and the location where the biopsy sample will be removed. A thin, flexible tube (catheter) will be inserted through your cervix and into the uterus. The catheter will be used to collect the biopsy sample from your endometrial tissue. The catheter and speculum will then be removed, and the tissue sample will be sent to a lab for examination. The procedure may vary among health care providers and hospitals. What can I expect after procedure? You will rest in a recovery area until you are ready to go home. You may have mild cramping and a small amount of vaginal bleeding. This is normal. You may have a small amount of vaginal bleeding for a few days. This is normal. It is up to you to get the results of your procedure. Ask your health care provider, or the department that is doing the procedure, when your results will be ready. Follow these instructions at home: Take over-the-counter and prescription medicines only as told by your health care provider. Do not douche, use tampons, or have sexual intercourse until your health care provider approves. Return to your normal activities as told by your health care provider. Ask your health care provider what activities are safe for you. Follow instructions   from your health care provider about any activity restrictions, such as restrictions on strenuous exercise or heavy lifting. Keep all follow-up visits. This is important. Contact a health care  provider: You have heavy bleeding, or bleed for longer than 2 days after the procedure. You have bad smelling discharge from your vagina. You have a fever or chills. You have a burning sensation when urinating or you have difficulty urinating. You have severe pain in your lower abdomen. Get help right away if you: You have severe cramps in your stomach or back. You pass large blood clots. Your bleeding increases. You become weak or light-headed, or you faint or lose consciousness. Summary An endometrial biopsy is a procedure to remove tissue samples is taken from the endometrium, which is the lining of the uterus. The tissue sample that is removed will be checked under a microscope for disease. This procedure is used to diagnose conditions such as endometrial cancer, endometrial tuberculosis, polyps, or other inflammatory conditions. After the procedure, it is common to have mild cramping and a small amount of vaginal bleeding for a few days. Do not douche, use tampons, or have sexual intercourse until your health care provider approves. Ask your health care provider which activities are safe for you. This information is not intended to replace advice given to you by your health care provider. Make sure you discuss any questions you have with your health care provider. Document Revised: 05/21/2020 Document Reviewed: 05/21/2020 Elsevier Patient Education  2022 [employer] Inc.  

## 2021-05-27 ENCOUNTER — Other Ambulatory Visit (HOSPITAL_COMMUNITY)
Admission: RE | Admit: 2021-05-27 | Discharge: 2021-05-27 | Disposition: A | Discharge status: Home / Self Care | Payer: Medicare Other | Source: Ambulatory Visit | Attending: Obstetrics and Gynecology | Admitting: Obstetrics and Gynecology

## 2021-05-27 ENCOUNTER — Ambulatory Visit: Payer: Medicare Other | Admitting: Obstetrics and Gynecology

## 2021-05-27 ENCOUNTER — Other Ambulatory Visit: Payer: Self-pay

## 2021-05-27 ENCOUNTER — Encounter: Payer: Self-pay | Admitting: Obstetrics and Gynecology

## 2021-05-27 VITALS — BP 152/72 | HR 113 | Ht 63.0 in | Wt 263.0 lb

## 2021-05-27 DIAGNOSIS — N950 Postmenopausal bleeding: Secondary | ICD-10-CM | POA: Insufficient documentation

## 2021-05-27 DIAGNOSIS — R9389 Abnormal findings on diagnostic imaging of other specified body structures: Secondary | ICD-10-CM

## 2021-05-27 NOTE — Progress Notes (Signed)
GYNECOLOGY  VISIT   HPI: 86 y.o.   Widowed  [place]  female   [phone] with No LMP recorded. Patient is postmenopausal.   here for endometrial biopsy. Patient began spotting again yesterday.  She has postmenopausal bleeding and a know polyp detected with EMB last year.   States she developed a cystic area in her neck after her thyroid surgery.  Had it for many years.  Has an umbilical hernia.   GYNECOLOGIC HISTORY: No LMP recorded. Patient is postmenopausal. Contraception:  PMP Menopausal hormone therapy:  none Last mammogram: 05-16-20 Diag.Bil/Neg/BiRads2 Last pap smear: 11-15-19 Neg, 11-26-12 Neg        OB History     Gravida  5   Para  4   Term      Preterm      AB  1   Living  3      SAB      IAB  1   Ectopic      Multiple      Live Births                 Patient Active Problem List   Diagnosis Date Noted   Snoring 02/14/2020   Insomnia 02/14/2020   History of breast cancer 05/20/2017   Idiopathic gout 05/20/2017   Hyperlipidemia LDL goal <100 05/20/2017   Bilateral edema of lower extremity 05/20/2017   Acute renal failure (ARF) (HCC)    Acute on chronic renal failure ([place]) 05/25/2016   ARF (acute renal failure) ([place]) 05/25/2016   Dyspnea on exertion 05/25/2016   Anemia 05/25/2016   Localized primary osteoarthritis of right shoulder region 05/21/2016   Osteoarthritis, multiple sites 03/09/2015   Hypothyroidism 03/09/2015   Goiter 03/09/2015   Essential hypertension, benign 03/09/2015   Diabetes mellitus, type 2 ([place]) 03/09/2015   DCIS (ductal carcinoma in situ) of breast 11/11/2013    Past Medical History:  Diagnosis Date   Allergy    Arthritis    Breast cancer ([place]) 10/24/13   right upper inner- DCIS   Cancer ([place])    Diabetes mellitus without complication (HCC)    GERD (gastroesophageal reflux disease)    occ   Gout    History of hiatal hernia    HOH (hard of hearing)    Hypertension    Hypothyroidism    Pneumonia     hx   Thyroid disease    Wears glasses     Past Surgical History:  Procedure Laterality Date   BREAST BIOPSY Right 10/26/2013   BREAST LUMPECTOMY Right 12/16/2013   BREAST LUMPECTOMY WITH NEEDLE LOCALIZATION Right 12/16/2013   Procedure: BREAST LUMPECTOMY WITH NEEDLE LOCALIZATION;  Surgeon: Edward Jolly, MD;  Location: [facility];  Service: General;  Laterality: Right;   CATARACT EXTRACTION Left 12/30/2017   CHOLECYSTECTOMY  1970   COLONOSCOPY     FINGER SURGERY     right hand-   gallbladder sugery     RE-EXCISION OF BREAST LUMPECTOMY Right 01/30/2014   Procedure: RE-EXCISION OF BREAST LUMPECTOMY;  Surgeon: Edward Jolly, MD;  Location: [facility];  Service: General;  Laterality: Right;   RE-EXCISION OF BREAST LUMPECTOMY Right 03/06/2014   Procedure: RE-EXCISION OF BREAST LUMPECTOMY;  Surgeon: Edward Jolly, MD;  Location: [facility];  Service: General;  Laterality: Right;   SHOULDER ARTHROSCOPY  2011   left   THYROID SURGERY  1970   TOTAL SHOULDER ARTHROPLASTY Right 05/21/2016   TOTAL SHOULDER ARTHROPLASTY  Right 05/21/2016   Procedure: RIGHT TOTAL SHOULDER ARTHROPLASTY;  Surgeon: Ninetta Lights, MD;  Location: [facility];  Service: Orthopedics;  Laterality: Right;   TUBAL LIGATION      Current Outpatient Medications  Medication Sig Dispense Refill   acetaminophen (TYLENOL) 500 MG tablet Take 500 mg by mouth as needed.     allopurinol (ZYLOPRIM) 100 MG tablet TAKE 1 TABLET BY MOUTH EVERY DAY FOR GOUT 90 tablet 1   amLODipine (NORVASC) 10 MG tablet Take 1 tablet (10 mg total) by mouth daily. 90 tablet 3   atorvastatin (LIPITOR) 10 MG tablet TAKE 1 TABLET(10 MG) BY MOUTH DAILY 90 tablet 1   CALCIUM-VITAMIN D PO Take by mouth.     diclofenac sodium (VOLTAREN) 1 % GEL Apply 4 g topically 4 (four) times daily as needed. 100 g 0   DULoxetine (CYMBALTA) 20 MG capsule Take 1 capsule (20 mg total) by mouth daily. 30 capsule 3    glipiZIDE (GLUCOTROL XL) 5 MG 24 hr tablet TAKE 1 TABLET BY MOUTH EVERY DAY WITH BREAKFAST 90 tablet 1   levothyroxine (SYNTHROID) 100 MCG tablet TAKE 1 TABLET BY MOUTH EVERY DAY 30 MINUTES BEFORE BREAKFAST ON AN EMPTY STOMACH 90 tablet 1   losartan (COZAAR) 50 MG tablet TAKE 1 TABLET(50 MG) BY MOUTH DAILY 90 tablet 1   medroxyPROGESTERone (PROVERA) 10 MG tablet Take 1 tablet (10 mg total) by mouth daily. 10 tablet 0   triamcinolone cream (KENALOG) 0.1 % APPLY EXTERNALLY TO THE AFFECTED AREA TWICE DAILY 45 g 3   No current facility-administered medications for this visit.     ALLERGIES: Actos [pioglitazone], Hydrocodone, Codeine, Neurontin [gabapentin], Other, and Peanut-containing drug products  Family History  Problem Relation Age of Onset   Diabetes Mother    Arthritis Father    Seizures Sister    Colon cancer Sister    Pneumonia Daughter    Cancer Sister        breast   Breast cancer Sister    Multiple sclerosis Son        Deceased     Social History   Socioeconomic History   Marital status: Widowed    Spouse name: Not on file   Number of children: Not on file   Years of education: Not on file   Highest education level: Not on file  Occupational History   Not on file  Tobacco Use   Smoking status: Never   Smokeless tobacco: Never  Vaping Use   Vaping Use: Never used  Substance and Sexual Activity   Alcohol use: No    Alcohol/week: 0.0 standard drinks   Drug use: No   Sexual activity: Not Currently    Comment: menarche age 29,1st intercourse 86 yo-Fewer than 5 partners  Other Topics Concern   Not on file  Social History Narrative   Diet: No      Do you drink/ eat things with caffeine? Yes      Marital status:    Widowed                           What year were you married ?  1956      Do you live in a house, apartment,assistred living, condo, trailer, etc.)?  apartment      Is it one or more stories?  one      How many persons live in your home ?  3  Do you have any pets in your home ?(please list)  No      Current or past profession:  Bank, Caregiver      Do you exercise?  No                            Type & how often:        Do you have a living will?  Yes      Do you have a DNR form?  Yes                     If not, do you want to discuss one?       Do you have signed POA?HPOA forms?                 If so, please bring to your        appointment      Social Determinants of Health   Financial Resource Strain: Not on file  Food Insecurity: Not on file  Transportation Needs: Not on file  Physical Activity: Not on file  Stress: Not on file  Social Connections: Not on file  Intimate Partner Violence: Not on file    Review of Systems  All other systems reviewed and are negative.  PHYSICAL EXAMINATION:    BP (!) 152/72   Pulse (!) 113   Ht 5\' 3"  (1.6 m)   Wt 263 lb (119.3 kg)   SpO2 98%   BMI 46.59 kg/m     General appearance: alert, cooperative and appears stated age  Pelvic: External genitalia:  no lesions              Urethra:  normal appearing urethra with no masses, tenderness or lesions              Bartholins and Skenes: normal                 Vagina: normal appearing vagina with normal color and discharge, no lesions              Cervix: no lesions                Bimanual Exam:  Uterus:  normal size, contour, position, consistency, mobility, non-tender              Adnexa: no mass, fullness, tenderness            EMB Consent for procedure.  Sterile prep with Hibiclens.  Paracervical block 10 cc 1% lidocaine, lot [id number], exp 11/2022. Tenaculum to anterior cervical lip.  Pipelle passed to 6 cm and minimal tissue obtained.  Tissue to pathology. Os finder used and cervix dilated.  Pipelle then passed to 8 cm twice.  Tissue to pathology.  No complications.  Minimal EBL.   Chaperone was present for exam:  Estill Bamberg, CMA  ASSESSMENT  Postmenopausal bleeding.  Thickened endometrium.  Hx endometrial  polyp, not removed.  Elevated creatinine.  PLAN  Fu EMB.  I anticipate hysteroscopy with Myosure removal of polyp, dilation and curettage.  If abnormal cells are seen, will refer to [facility].  Patient aware of plan.

## 2021-05-27 NOTE — Patient Instructions (Signed)
Atlas of pelvic anatomy and gynecologic surgery (4th ed., pp. 205-212). Elsevier.">  Dilation and Curettage or Vacuum Curettage Dilation and curettage (D&C) and vacuum curettage are minor procedures. A D&C involves stretching the cervix (dilation) and scraping the inside lining of the uterus, or endometrium, with surgical instruments (curettage). During a D&C, tissue is gently scraped starting from the top of the uterus down to the lowest part of the uterus. During a vacuum curettage, the liningand tissue in the uterus are removed using gentle suction. Curettage may be performed to either diagnose or treat a problem. A diagnostic curettage may be done if you have: Irregular bleeding or clotting from the uterus. Spotting between menstrual periods, prolonged menstrual periods, or other abnormal bleeding. Bleeding after menopause. No menstrual period (amenorrhea). A change in the size and shape of the uterus. Abnormal endometrial cells discovered during a Pap test. For treatment, curettage may be done: To remove an IUD (intrauterine device). To remove the remaining placenta after giving birth. During an abortion or after a miscarriage. To remove growths in the lining of the uterus. To remove certain rare types of noncancerous lumps (fibroids). Tell a health care provider about: Any allergies you have, including allergies to prescribed medicine or latex. All medicines you are taking, including vitamins, herbs, eye drops, creams, and over-the-counter medicines. Any problems you or family members have had with anesthetic medicines. Any blood disorders you have. Any surgeries you have had. Your medical history and any medical conditions you have. Whether you are pregnant or may be pregnant. Recent vaginal infections you have had. Recent menstrual periods, bleeding problems you have had, and what form of birth control (contraception) you use. What are the risks? Generally, this is a safe  procedure. However, problems may occur, including: Infection. Heavy vaginal bleeding. Allergic reactions to medicines. Damage to the cervix or nearby structures or organs. Scar tissue developing inside the uterus. This can cause abnormal periods and may make it harder to get pregnant. A hole (perforation) in the wall of the uterus. This is rare. What happens before the procedure? Staying hydrated Follow instructions from your health care provider about hydration, which may include: Up to 2 hours before the procedure - you may continue to drink clear liquids, such as water, clear fruit juice, black coffee, and plain tea.  Eating and drinking restrictions Follow instructions from your health care provider about eating and drinking, which may include: 8 hours before the procedure - stop eating heavy meals or foods, such as meat, fried foods, or fatty foods. 6 hours before the procedure - stop eating light meals or foods, such as toast or cereal. 6 hours before the procedure - stop drinking milk or drinks that contain milk. 2 hours before the procedure - stop drinking clear liquids. If your health care provider told you to take your medicine on the day of your procedure, take them with only a sip of water. Medicines Ask your health care provider about: Changing or stopping your regular medicines. This is especially important if you are taking diabetes medicines or blood thinners. Taking medicines such as aspirin and ibuprofen. These medicines can thin your blood. Do not take these medicines unless your health care provider tells you to take them. Taking over-the-counter medicines, vitamins, herbs, and supplements. You may be given a medicine to soften the cervix. This will help with dilation. Tests You may be given a pregnancy test on the day of the procedure. You may have a blood or urine sample taken.   General instructions Do not use any products that contain nicotine or tobacco for at least  4 weeks before the procedure. These products include cigarettes, chewing tobacco, and vaping devices, such as e-cigarettes. If you need help quitting, ask your health care provider. For 24 hours before your procedure: Do not douche, use tampons, or have sex. Do not use medicines, creams, or suppositories in the vagina. Ask your health care provider what steps will be taken to help prevent infection. These may include: Removing hair at the procedure site. Washing skin with a germ-killing soap. Taking antibiotic medicine. Plan to have a responsible adult take you home from the hospital or clinic. If you will be going home right after the procedure, plan to have a responsible adult care for you for the time you are told. This is important. What happens during the procedure?  An IV will be inserted into one of your veins. You will be given one of the following: A medicine that numbs the area in and around the cervix (local anesthetic). A medicine to make you fall asleep (general anesthetic). You will lie down on your back, with your feet in foot rests (stirrups). The size and position of your uterus will be checked. A lubricated instrument (speculum or Sims retractor) will be inserted into your vagina to widen its walls. This will allow your health care provider to see your cervix. Your cervix will be softened and dilated. This may be done by: Taking medicine by mouth or vaginally. Having thin rods or gradual widening instruments inserted into your cervix. A small, sharp, curved instrument (curette) will be used to scrape a small amount of tissue or cells from the endometrium or cervical canal. In some cases, gentle suction is applied with the curette. The cells will be taken to a lab for testing. The procedure may vary among health care providers and hospitals. What happens after the procedure? Your blood pressure, heart rate, breathing rate, and blood oxygen level will be monitored until you  leave the hospital or clinic. You may have mild cramping, a backache, pain, and light bleeding or spotting. You may pass small blood clots from your vagina. You may have to wear compression stockings. These stockings help to prevent blood clots and reduce swelling in your legs. It is up to you to get the results of your procedure. Ask your health care provider, or the department that is doing the procedure, when your results will be ready. Summary Dilation and curettage (D&C) involves stretching (dilating) the cervix and scraping the inside lining of the uterus with surgical instruments (curettage). Follow your health care provider's instructions about when to stop eating and drinking before the procedure, and whether to stop or change any medicines. After the procedure, you may have mild cramping, a backache, pain, and light bleeding or spotting. You may pass small blood clots from your vagina. Plan to have a responsible adult take you home from the hospital or clinic. This information is not intended to replace advice given to you by your health care provider. Make sure you discuss any questions you have with your healthcare provider. Document Revised: 10/17/2020 Document Reviewed: 10/17/2020 Elsevier Patient Education  2022 [employer] Hysteroscopy Hysteroscopy is a procedure used to look inside a woman's womb (uterus). This may be done for various reasons, including: To look for tumors and other growths in the uterus. To evaluate abnormal bleeding, fibroid tumors, polyps, scar tissue, or uterine cancer. To determine why a woman is unable to get   pregnant or has had repeated pregnancy losses. To locate an IUD (intrauterine device). To place a birth control device into the fallopian tubes. During this procedure, a thin, flexible tube with a small light and camera (hysteroscope) is used to examine the uterus. The camera sends images to a monitor in the room so that your health care provider can  view the inside of your uterus. Ahysteroscopy should be done right after a menstrual period. Tell a health care provider about: Any allergies you have. All medicines you are taking, including vitamins, herbs, eye drops, creams, and over-the-counter medicines. Any problems you or family members have had with anesthetic medicines. Any blood disorders you have. Any surgeries you have had. Any medical conditions you have. Whether you are pregnant or may be pregnant. Whether you have been diagnosed with an STI (sexually transmitted infection) or you think you have an STI. What are the risks? Generally, this is a safe procedure. However, problems may occur, including: Excessive bleeding. Infection. Damage to the uterus or other structures or organs. Allergic reaction to medicines or fluids that are used in the procedure. What happens before the procedure? Staying hydrated Follow instructions from your health care provider about hydration, which may include: Up to 2 hours before the procedure - you may continue to drink clear liquids, such as water, clear fruit juice, black coffee, and plain tea. Eating and drinking restrictions Follow instructions from your health care provider about eating and drinking, which may include: 8 hours before the procedure - stop eating solid foods and drink clear liquids only. 2 hours before the procedure - stop drinking clear liquids. Medicines Ask your health care provider about: Changing or stopping your regular medicines. This is especially important if you are taking diabetes medicines or blood thinners. Taking medicines such as aspirin and ibuprofen. These medicines can thin your blood. Do not take these medicines unless your health care provider tells you to take them. Taking over-the-counter medicines, vitamins, herbs, and supplements. Medicine may be placed in your cervix the day before the procedure. This medicine causes the cervix to open (dilate). The  larger opening makes it easier for the hysteroscope to be inserted into the uterus during the procedure. General instructions Ask your health care provider: What steps will be taken to help prevent infection. These steps may include: Washing skin with a germ-killing soap. Taking antibiotic medicine. Do not use any products that contain nicotine or tobacco for at least 4 weeks before the procedure. These products include cigarettes, chewing tobacco, and vaping devices, such as e-cigarettes. If you need help quitting, ask your health care provider. Plan to have a responsible adult take you home from the hospital or clinic. Plan to have a responsible adult care for you for the time you are told after you leave the hospital or clinic. This is important. Empty your bladder before the procedure begins. What happens during the procedure? An IV will be inserted into one of your veins. You may be given: A medicine to help you relax (sedative). A medicine that numbs the area around the cervix (local anesthetic). A medicine to make you fall asleep (general anesthetic). A hysteroscope will be inserted through your vagina and into your uterus. Air or fluid will be used to enlarge your uterus to allow your health care provider to see it better. The amount of fluid used will be carefully checked throughout the procedure. In some cases, tissue may be gently scraped from inside the uterus and sent to a   lab for testing (biopsy). The procedure may vary among health care providers and hospitals. What can I expect after the procedure? Your blood pressure, heart rate, breathing rate, and blood oxygen level will be monitored until you leave the hospital or clinic. You may have cramps. You may be given medicines for this. You may have bleeding, which may vary from light spotting to menstrual-like bleeding. This is normal. If you had a biopsy, it is up to you to get the results. Ask your health care provider, or the  department that is doing the procedure, when your results will be ready. Follow these instructions at home: Activity Rest as told by your health care provider. Return to your normal activities as told by your health care provider. Ask your health care provider what activities are safe for you. If you were given a sedative during the procedure, it can affect you for several hours. Do not drive or operate machinery until your health care provider says that it is safe. Medicines Do not take aspirin or other NSAIDs during recovery, as told by your healthcare provider. It can increase the risk of bleeding. Ask your health care provider if the medicine prescribed to you: Requires you to avoid driving or using machinery. Can cause constipation. You may need to take these actions to prevent or treat constipation: Drink enough fluid to keep your urine pale yellow. Take over-the-counter or prescription medicines. Eat foods that are high in fiber, such as beans, whole grains, and fresh fruits and vegetables. Limit foods that are high in fat and processed sugars, such as fried or sweet foods. General instructions Do not douche, use tampons, or have sex for 2 weeks after the procedure, or until your health care provider approves. Do not take baths, swim, or use a hot tub until your health care provider approves. Take showers instead of baths for 2 weeks, or for as long as told by your health care provider. Keep all follow-up visits. This is important. Contact a health care provider if: You feel dizzy or lightheaded. You feel nauseous. You have abnormal vaginal discharge. You have a rash. You have pain that does not get better with medicine. You have chills. Get help right away if: You have bleeding that is heavier than a normal menstrual period. You have a fever. You have pain or cramps that get worse. You develop new abdominal pain. You faint. You have pain in your shoulder. You are short of  breath. Summary Hysteroscopy is a procedure that is used to look inside a woman's womb (uterus). After the procedure, you may have bleeding, which varies from light spotting to menstrual-like bleeding. This is normal. You may also have cramps. Do not douche, use tampons, or have sex for 2 weeks after the procedure, or until your health care provider approves. Plan to have a responsible adult take you home from the hospital or clinic. This information is not intended to replace advice given to you by your health care provider. Make sure you discuss any questions you have with your healthcare provider. Document Revised: 06/13/2020 Document Reviewed: 06/13/2020 Elsevier Patient Education  2022 [employer]  

## 2021-05-28 DIAGNOSIS — R9389 Abnormal findings on diagnostic imaging of other specified body structures: Secondary | ICD-10-CM | POA: Diagnosis not present

## 2021-05-29 ENCOUNTER — Telehealth: Payer: Self-pay

## 2021-05-29 LAB — SURGICAL PATHOLOGY

## 2021-05-29 NOTE — Telephone Encounter (Signed)
Per Dr. Elza Rafter result note:  "Please contact patient with results of endometrial biopsy showing benign endometrial polyp.  I recommend proceeding with a hysteroscopy, Myosure resection of endometrial polyp, dilation and curettage.  Patient will be expecting this possible recommendation.   Please precert and schedule at [facility].  1 hour of time needed.   She will need a preop visit with me. "   Patient is aware of result and Is agreeable to moving forward with scheduling surgery.

## 2021-06-04 NOTE — Telephone Encounter (Signed)
Spoke with patient. Reviewed surgery dates. Patient request to proceed with surgery on 06/18/21. Advised patient I will return call once surgery is scheduled to confirm and review pre-op instructions. Pre-op scheduled for 06/14/21. Patient verbalizes understanding and is agreeable.   Surgery request sent.   Routing to [employer]
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
## 2021-06-06 NOTE — Telephone Encounter (Signed)
Spoke with patient regarding surgery benefits. Patient acknowledges understanding of information presented. Patient is aware that benefits presented are professional benefits only. Patient is aware that once surgery is scheduled, the hospital will call with separate benefits. See account note.  Routing to Jill Hamm, RN, for surgery scheduling. 

## 2021-06-10 ENCOUNTER — Other Ambulatory Visit: Payer: Self-pay | Admitting: Nurse Practitioner

## 2021-06-10 DIAGNOSIS — G8929 Other chronic pain: Secondary | ICD-10-CM

## 2021-06-10 NOTE — Telephone Encounter (Signed)
Spoke with patient. Surgery date request confirmed.  Advised surgery is scheduled for 06/18/21, [facility] at 1130.  Surgery instruction sheet and hospital brochure reviewed, printed copy will be mailed.Patient advised if Covid screening and quarantine requirements and agreeable.   Pre-op scheduled for 06/14/21 at 0900.   Patient asked if she would be a candidate for hysterectomy at this point instead of hysteroscopy D&C? Advised I will review request with Dr. Quincy Simmonds and f/u. I will call with any additional recommendations, patient agreeable.   Dr. Quincy Simmonds -please review.

## 2021-06-10 NOTE — Telephone Encounter (Signed)
I would recommend proceeding with hysteroscopy and polyp removal at this time.  Hysteroscopy is the standard of care for treating a polyp and is much less invasive than hysterectomy.  I am happy to discuss this further at her appointment this week.

## 2021-06-10 NOTE — Telephone Encounter (Signed)
Spoke with patient. Advised as seen below per Dr. Quincy Simmonds.  Patient verbalizes understanding and is agreeable.   Encounter closed.

## 2021-06-12 ENCOUNTER — Encounter (HOSPITAL_BASED_OUTPATIENT_CLINIC_OR_DEPARTMENT_OTHER): Payer: Self-pay | Admitting: Obstetrics and Gynecology

## 2021-06-13 ENCOUNTER — Encounter (HOSPITAL_BASED_OUTPATIENT_CLINIC_OR_DEPARTMENT_OTHER): Payer: Self-pay | Admitting: Obstetrics and Gynecology

## 2021-06-13 ENCOUNTER — Other Ambulatory Visit: Payer: Self-pay

## 2021-06-13 NOTE — Progress Notes (Addendum)
ADDENDUM:  Karla Willis assessed by anesthesia, Dr Nyoka Cowden MDA.  Dr Nyoka Cowden stated Karla Willis ok to proceed '@[facility]'$ .   ADDENDUM:  Called and spoke w/ Karla Willis via phone informed her anesthesia needs to evaluate her airway due to her BMI greater than 45.  Karla Willis will come by [facility] after her pre-op office visit w/ Dr Quincy Simmonds 06-14-2021 @ 0900.   Spoke w/ via phone for pre-op interview--- Karla Willis Lab needs dos----  CBC, BMP            Lab results------ current ekg in epic/ chart COVID test -----patient states asymptomatic no test needed Arrive at ------- 0930 on 06-18-2021 NPO after MN NO Solid Food.  Clear liquids from MN until--- 0830 Med rec completed Medications to take morning of surgery ----- Norvasc, Synthroid, Lipitor, Allopurinol, cymbalta Diabetic medication ----- do not take glipizide morning of surgery Patient instructed no nail polish to be worn day of surgery Patient instructed to bring photo id and insurance card day of surgery Patient aware to have Driver (ride ) / caregiver  for 24 hours after surgery --granddaughter, Deborra Medina Patient Special Instructions ----- n/a Pre-Op special Istructions ----- n/a Patient verbalized understanding of instructions that were given at this phone interview. Patient denies shortness of breath, chest pain, fever, cough at this phone interview.    Anesthesia Review: HTN; CKD 2;  DM2; chronic gout;  Hypothyroidism post surgical (unilateral lobectomy 1970);  hx right breast cancer s/p lumpectomy , no chemo/ radiation;  Karla Willis BMI 46.07. Reviewed chart w/ anesthesia, Dr Valma Cava MDA, via phone.  Dr Valma Cava Karla Willis would need to come in for airway evaluation consult.  PCP: Sherrie Mustache NP (lov 03-18-2021) Chest x-ray : 03-18-2021 epic EKG : 06-11-2020 epic Echo : 05-26-2016 epic Stress test: no Cardiac Cath :  no Activity level: denies sob w/ any activity Sleep Study/ CPAP : NO Fasting Blood Sugar :    101--120  / Checks Blood Sugar -- times a day:  once wkly in am Blood Thinner/  Instructions /Last Dose: no ASA / Instructions/ Last Dose :  no

## 2021-06-14 ENCOUNTER — Encounter: Payer: Self-pay | Admitting: Obstetrics and Gynecology

## 2021-06-14 ENCOUNTER — Ambulatory Visit (INDEPENDENT_AMBULATORY_CARE_PROVIDER_SITE_OTHER): Payer: Medicare Other | Admitting: Obstetrics and Gynecology

## 2021-06-14 VITALS — BP 138/72 | HR 100 | Ht 63.0 in | Wt 263.0 lb

## 2021-06-14 DIAGNOSIS — N950 Postmenopausal bleeding: Secondary | ICD-10-CM

## 2021-06-14 DIAGNOSIS — N840 Polyp of corpus uteri: Secondary | ICD-10-CM

## 2021-06-14 NOTE — Patient Instructions (Signed)
Hysteroscopy Hysteroscopy is a procedure used to look inside a woman's womb (uterus). This may be done for various reasons, including: To look for tumors and other growths in the uterus. To evaluate abnormal bleeding, fibroid tumors, polyps, scar tissue, or uterine cancer. To determine why a woman is unable to get pregnant or has had repeated pregnancy losses. To locate an IUD (intrauterine device). To place a birth control device into the fallopian tubes. During this procedure, a thin, flexible tube with a small light and camera (hysteroscope) is used to examine the uterus. The camera sends images to a monitor in the room so that your health care provider can view the inside of your uterus. Ahysteroscopy should be done right after a menstrual period. Tell a health care provider about: Any allergies you have. All medicines you are taking, including vitamins, herbs, eye drops, creams, and over-the-counter medicines. Any problems you or family members have had with anesthetic medicines. Any blood disorders you have. Any surgeries you have had. Any medical conditions you have. Whether you are pregnant or may be pregnant. Whether you have been diagnosed with an STI (sexually transmitted infection) or you think you have an STI. What are the risks? Generally, this is a safe procedure. However, problems may occur, including: Excessive bleeding. Infection. Damage to the uterus or other structures or organs. Allergic reaction to medicines or fluids that are used in the procedure. What happens before the procedure? Staying hydrated Follow instructions from your health care provider about hydration, which may include: Up to 2 hours before the procedure - you may continue to drink clear liquids, such as water, clear fruit juice, black coffee, and plain tea. Eating and drinking restrictions Follow instructions from your health care provider about eating and drinking, which may include: 8 hours before  the procedure - stop eating solid foods and drink clear liquids only. 2 hours before the procedure - stop drinking clear liquids. Medicines Ask your health care provider about: Changing or stopping your regular medicines. This is especially important if you are taking diabetes medicines or blood thinners. Taking medicines such as aspirin and ibuprofen. These medicines can thin your blood. Do not take these medicines unless your health care provider tells you to take them. Taking over-the-counter medicines, vitamins, herbs, and supplements. Medicine may be placed in your cervix the day before the procedure. This medicine causes the cervix to open (dilate). The larger opening makes it easier for the hysteroscope to be inserted into the uterus during the procedure. General instructions Ask your health care provider: What steps will be taken to help prevent infection. These steps may include: Washing skin with a germ-killing soap. Taking antibiotic medicine. Do not use any products that contain nicotine or tobacco for at least 4 weeks before the procedure. These products include cigarettes, chewing tobacco, and vaping devices, such as e-cigarettes. If you need help quitting, ask your health care provider. Plan to have a responsible adult take you home from the hospital or clinic. Plan to have a responsible adult care for you for the time you are told after you leave the hospital or clinic. This is important. Empty your bladder before the procedure begins. What happens during the procedure? An IV will be inserted into one of your veins. You may be given: A medicine to help you relax (sedative). A medicine that numbs the area around the cervix (local anesthetic). A medicine to make you fall asleep (general anesthetic). A hysteroscope will be inserted through your vagina and   into your uterus. Air or fluid will be used to enlarge your uterus to allow your health care provider to see it better. The  amount of fluid used will be carefully checked throughout the procedure. In some cases, tissue may be gently scraped from inside the uterus and sent to a lab for testing (biopsy). The procedure may vary among health care providers and hospitals. What can I expect after the procedure? Your blood pressure, heart rate, breathing rate, and blood oxygen level will be monitored until you leave the hospital or clinic. You may have cramps. You may be given medicines for this. You may have bleeding, which may vary from light spotting to menstrual-like bleeding. This is normal. If you had a biopsy, it is up to you to get the results. Ask your health care provider, or the department that is doing the procedure, when your results will be ready. Follow these instructions at home: Activity Rest as told by your health care provider. Return to your normal activities as told by your health care provider. Ask your health care provider what activities are safe for you. If you were given a sedative during the procedure, it can affect you for several hours. Do not drive or operate machinery until your health care provider says that it is safe. Medicines Do not take aspirin or other NSAIDs during recovery, as told by your healthcare provider. It can increase the risk of bleeding. Ask your health care provider if the medicine prescribed to you: Requires you to avoid driving or using machinery. Can cause constipation. You may need to take these actions to prevent or treat constipation: Drink enough fluid to keep your urine pale yellow. Take over-the-counter or prescription medicines. Eat foods that are high in fiber, such as beans, whole grains, and fresh fruits and vegetables. Limit foods that are high in fat and processed sugars, such as fried or sweet foods. General instructions Do not douche, use tampons, or have sex for 2 weeks after the procedure, or until your health care provider approves. Do not take baths,  swim, or use a hot tub until your health care provider approves. Take showers instead of baths for 2 weeks, or for as long as told by your health care provider. Keep all follow-up visits. This is important. Contact a health care provider if: You feel dizzy or lightheaded. You feel nauseous. You have abnormal vaginal discharge. You have a rash. You have pain that does not get better with medicine. You have chills. Get help right away if: You have bleeding that is heavier than a normal menstrual period. You have a fever. You have pain or cramps that get worse. You develop new abdominal pain. You faint. You have pain in your shoulder. You are short of breath. Summary Hysteroscopy is a procedure that is used to look inside a woman's womb (uterus). After the procedure, you may have bleeding, which varies from light spotting to menstrual-like bleeding. This is normal. You may also have cramps. Do not douche, use tampons, or have sex for 2 weeks after the procedure, or until your health care provider approves. Plan to have a responsible adult take you home from the hospital or clinic. This information is not intended to replace advice given to you by your health care provider. Make sure you discuss any questions you have with your healthcare provider. Document Revised: 06/13/2020 Document Reviewed: 06/13/2020 Elsevier Patient Education  2022 [employer]  Atlas of pelvic anatomy and gynecologic surgery (4th ed., pp.   205-212). Elsevier.">  Dilation and Curettage or Vacuum Curettage Dilation and curettage (D&C) and vacuum curettage are minor procedures. A D&C involves stretching the cervix (dilation) and scraping the inside lining of the uterus, or endometrium, with surgical instruments (curettage). During a D&C, tissue is gently scraped starting from the top of the uterus down to the lowest part of the uterus. During a vacuum curettage, the liningand tissue in the uterus are removed using  gentle suction. Curettage may be performed to either diagnose or treat a problem. A diagnostic curettage may be done if you have: Irregular bleeding or clotting from the uterus. Spotting between menstrual periods, prolonged menstrual periods, or other abnormal bleeding. Bleeding after menopause. No menstrual period (amenorrhea). A change in the size and shape of the uterus. Abnormal endometrial cells discovered during a Pap test. For treatment, curettage may be done: To remove an IUD (intrauterine device). To remove the remaining placenta after giving birth. During an abortion or after a miscarriage. To remove growths in the lining of the uterus. To remove certain rare types of noncancerous lumps (fibroids). Tell a health care provider about: Any allergies you have, including allergies to prescribed medicine or latex. All medicines you are taking, including vitamins, herbs, eye drops, creams, and over-the-counter medicines. Any problems you or family members have had with anesthetic medicines. Any blood disorders you have. Any surgeries you have had. Your medical history and any medical conditions you have. Whether you are pregnant or may be pregnant. Recent vaginal infections you have had. Recent menstrual periods, bleeding problems you have had, and what form of birth control (contraception) you use. What are the risks? Generally, this is a safe procedure. However, problems may occur, including: Infection. Heavy vaginal bleeding. Allergic reactions to medicines. Damage to the cervix or nearby structures or organs. Scar tissue developing inside the uterus. This can cause abnormal periods and may make it harder to get pregnant. A hole (perforation) in the wall of the uterus. This is rare. What happens before the procedure? Staying hydrated Follow instructions from your health care provider about hydration, which may include: Up to 2 hours before the procedure - you may continue to  drink clear liquids, such as water, clear fruit juice, black coffee, and plain tea.  Eating and drinking restrictions Follow instructions from your health care provider about eating and drinking, which may include: 8 hours before the procedure - stop eating heavy meals or foods, such as meat, fried foods, or fatty foods. 6 hours before the procedure - stop eating light meals or foods, such as toast or cereal. 6 hours before the procedure - stop drinking milk or drinks that contain milk. 2 hours before the procedure - stop drinking clear liquids. If your health care provider told you to take your medicine on the day of your procedure, take them with only a sip of water. Medicines Ask your health care provider about: Changing or stopping your regular medicines. This is especially important if you are taking diabetes medicines or blood thinners. Taking medicines such as aspirin and ibuprofen. These medicines can thin your blood. Do not take these medicines unless your health care provider tells you to take them. Taking over-the-counter medicines, vitamins, herbs, and supplements. You may be given a medicine to soften the cervix. This will help with dilation. Tests You may be given a pregnancy test on the day of the procedure. You may have a blood or urine sample taken. General instructions Do not use any products that contain   nicotine or tobacco for at least 4 weeks before the procedure. These products include cigarettes, chewing tobacco, and vaping devices, such as e-cigarettes. If you need help quitting, ask your health care provider. For 24 hours before your procedure: Do not douche, use tampons, or have sex. Do not use medicines, creams, or suppositories in the vagina. Ask your health care provider what steps will be taken to help prevent infection. These may include: Removing hair at the procedure site. Washing skin with a germ-killing soap. Taking antibiotic medicine. Plan to have a  responsible adult take you home from the hospital or clinic. If you will be going home right after the procedure, plan to have a responsible adult care for you for the time you are told. This is important. What happens during the procedure?  An IV will be inserted into one of your veins. You will be given one of the following: A medicine that numbs the area in and around the cervix (local anesthetic). A medicine to make you fall asleep (general anesthetic). You will lie down on your back, with your feet in foot rests (stirrups). The size and position of your uterus will be checked. A lubricated instrument (speculum or Sims retractor) will be inserted into your vagina to widen its walls. This will allow your health care provider to see your cervix. Your cervix will be softened and dilated. This may be done by: Taking medicine by mouth or vaginally. Having thin rods or gradual widening instruments inserted into your cervix. A small, sharp, curved instrument (curette) will be used to scrape a small amount of tissue or cells from the endometrium or cervical canal. In some cases, gentle suction is applied with the curette. The cells will be taken to a lab for testing. The procedure may vary among health care providers and hospitals. What happens after the procedure? Your blood pressure, heart rate, breathing rate, and blood oxygen level will be monitored until you leave the hospital or clinic. You may have mild cramping, a backache, pain, and light bleeding or spotting. You may pass small blood clots from your vagina. You may have to wear compression stockings. These stockings help to prevent blood clots and reduce swelling in your legs. It is up to you to get the results of your procedure. Ask your health care provider, or the department that is doing the procedure, when your results will be ready. Summary Dilation and curettage (D&C) involves stretching (dilating) the cervix and scraping the  inside lining of the uterus with surgical instruments (curettage). Follow your health care provider's instructions about when to stop eating and drinking before the procedure, and whether to stop or change any medicines. After the procedure, you may have mild cramping, a backache, pain, and light bleeding or spotting. You may pass small blood clots from your vagina. Plan to have a responsible adult take you home from the hospital or clinic. This information is not intended to replace advice given to you by your health care provider. Make sure you discuss any questions you have with your healthcare provider. Document Revised: 10/17/2020 Document Reviewed: 10/17/2020 Elsevier Patient Education  2022 [employer]  

## 2021-06-15 ENCOUNTER — Other Ambulatory Visit: Payer: Self-pay | Admitting: Nurse Practitioner
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
## 2021-06-18 ENCOUNTER — Other Ambulatory Visit: Payer: Self-pay

## 2021-06-18 ENCOUNTER — Encounter (HOSPITAL_BASED_OUTPATIENT_CLINIC_OR_DEPARTMENT_OTHER): Payer: Self-pay | Admitting: Obstetrics and Gynecology

## 2021-06-18 ENCOUNTER — Ambulatory Visit (HOSPITAL_BASED_OUTPATIENT_CLINIC_OR_DEPARTMENT_OTHER): Payer: Medicare Other | Admitting: Anesthesiology

## 2021-06-18 ENCOUNTER — Encounter (HOSPITAL_BASED_OUTPATIENT_CLINIC_OR_DEPARTMENT_OTHER)
Admission: RE | Disposition: A | Discharge status: Home / Self Care | Payer: Self-pay | Source: Home / Self Care | Attending: Obstetrics and Gynecology

## 2021-06-18 ENCOUNTER — Ambulatory Visit (HOSPITAL_BASED_OUTPATIENT_CLINIC_OR_DEPARTMENT_OTHER)
Admission: RE | Admit: 2021-06-18 | Discharge: 2021-06-18 | Disposition: A | Discharge status: Home / Self Care | Payer: Medicare Other | Attending: Obstetrics and Gynecology | Admitting: Obstetrics and Gynecology

## 2021-06-18 DIAGNOSIS — N840 Polyp of corpus uteri: Secondary | ICD-10-CM | POA: Diagnosis not present

## 2021-06-18 DIAGNOSIS — Z91010 Allergy to peanuts: Secondary | ICD-10-CM | POA: Insufficient documentation

## 2021-06-18 DIAGNOSIS — Z79890 Hormone replacement therapy (postmenopausal): Secondary | ICD-10-CM | POA: Insufficient documentation

## 2021-06-18 DIAGNOSIS — Z7984 Long term (current) use of oral hypoglycemic drugs: Secondary | ICD-10-CM | POA: Insufficient documentation

## 2021-06-18 DIAGNOSIS — Z888 Allergy status to other drugs, medicaments and biological substances status: Secondary | ICD-10-CM | POA: Insufficient documentation

## 2021-06-18 DIAGNOSIS — E1122 Type 2 diabetes mellitus with diabetic chronic kidney disease: Secondary | ICD-10-CM | POA: Diagnosis not present

## 2021-06-18 DIAGNOSIS — Z833 Family history of diabetes mellitus: Secondary | ICD-10-CM | POA: Diagnosis not present

## 2021-06-18 DIAGNOSIS — Z820 Family history of epilepsy and other diseases of the nervous system: Secondary | ICD-10-CM | POA: Diagnosis not present

## 2021-06-18 DIAGNOSIS — D631 Anemia in chronic kidney disease: Secondary | ICD-10-CM | POA: Diagnosis not present

## 2021-06-18 DIAGNOSIS — Z800 Family history of malignant neoplasm of digestive organs: Secondary | ICD-10-CM | POA: Insufficient documentation

## 2021-06-18 DIAGNOSIS — Z79899 Other long term (current) drug therapy: Secondary | ICD-10-CM | POA: Diagnosis not present

## 2021-06-18 DIAGNOSIS — N950 Postmenopausal bleeding: Secondary | ICD-10-CM | POA: Diagnosis not present

## 2021-06-18 DIAGNOSIS — N182 Chronic kidney disease, stage 2 (mild): Secondary | ICD-10-CM | POA: Diagnosis not present

## 2021-06-18 DIAGNOSIS — Z8261 Family history of arthritis: Secondary | ICD-10-CM | POA: Diagnosis not present

## 2021-06-18 DIAGNOSIS — E119 Type 2 diabetes mellitus without complications: Secondary | ICD-10-CM | POA: Diagnosis not present

## 2021-06-18 DIAGNOSIS — Z885 Allergy status to narcotic agent status: Secondary | ICD-10-CM | POA: Insufficient documentation

## 2021-06-18 DIAGNOSIS — I129 Hypertensive chronic kidney disease with stage 1 through stage 4 chronic kidney disease, or unspecified chronic kidney disease: Secondary | ICD-10-CM | POA: Diagnosis not present

## 2021-06-18 DIAGNOSIS — Z803 Family history of malignant neoplasm of breast: Secondary | ICD-10-CM | POA: Insufficient documentation

## 2021-06-18 DIAGNOSIS — N179 Acute kidney failure, unspecified: Secondary | ICD-10-CM | POA: Diagnosis not present

## 2021-06-18 HISTORY — PX: DILATATION & CURETTAGE/HYSTEROSCOPY WITH MYOSURE: SHX6511

## 2021-06-18 HISTORY — DX: Type 2 diabetes mellitus without complications: E11.9

## 2021-06-18 HISTORY — DX: Complete loss of teeth, unspecified cause, unspecified class: K08.109

## 2021-06-18 HISTORY — DX: Polyp of corpus uteri: N84.0

## 2021-06-18 HISTORY — DX: Personal history of COVID-19: Z86.16

## 2021-06-18 HISTORY — DX: Complete loss of teeth, unspecified cause, unspecified class: Z97.2

## 2021-06-18 HISTORY — DX: Chronic kidney disease, stage 2 (mild): N18.2

## 2021-06-18 HISTORY — DX: Overactive bladder: N32.81

## 2021-06-18 HISTORY — DX: Diaphragmatic hernia without obstruction or gangrene: K44.9

## 2021-06-18 HISTORY — DX: Other specified disorders of bone density and structure, unspecified site: M85.80

## 2021-06-18 HISTORY — DX: Idiopathic chronic gout, unspecified site, without tophus (tophi): M1A.00X0

## 2021-06-18 HISTORY — DX: Essential tremor: G25.0

## 2021-06-18 HISTORY — DX: Presence of external hearing-aid: Z97.4

## 2021-06-18 HISTORY — DX: Postmenopausal bleeding: N95.0

## 2021-06-18 LAB — BASIC METABOLIC PANEL
Anion gap: 8 (ref 5–15)
BUN: 13 mg/dL (ref 8–23)
CO2: 25 mmol/L (ref 22–32)
Calcium: 10.1 mg/dL (ref 8.9–10.3)
Chloride: 107 mmol/L (ref 98–111)
Creatinine, Ser: 0.95 mg/dL (ref 0.44–1.00)
GFR, Estimated: 59 mL/min — ABNORMAL LOW (ref >60)
Glucose, Bld: 121 mg/dL — ABNORMAL HIGH (ref 70–99)
Potassium: 3.6 mmol/L (ref 3.5–5.1)
Sodium: 140 mmol/L (ref 135–145)

## 2021-06-18 LAB — CBC
HCT: 35.4 % — ABNORMAL LOW (ref 36.0–46.0)
Hemoglobin: 12 g/dL (ref 12.0–15.0)
MCH: 29.2 pg (ref 26.0–34.0)
MCHC: 33.9 g/dL (ref 30.0–36.0)
MCV: 86.1 fL (ref 80.0–100.0)
Platelets: 205 10*3/uL (ref 150–400)
RBC: 4.11 MIL/uL (ref 3.87–5.11)
RDW: 14.8 % (ref 11.5–15.5)
WBC: 5.3 10*3/uL (ref 4.0–10.5)
nRBC: 0 % (ref 0.0–0.2)

## 2021-06-18 LAB — GLUCOSE, CAPILLARY
Glucose-Capillary: 129 mg/dL — ABNORMAL HIGH (ref 70–99)
Glucose-Capillary: 95 mg/dL (ref 70–99)

## 2021-06-18 SURGERY — DILATATION & CURETTAGE/HYSTEROSCOPY WITH MYOSURE
Anesthesia: General | Site: Vagina

## 2021-06-18 MED ORDER — LIDOCAINE HCL 1 % IJ SOLN
INTRAMUSCULAR | Status: DC | PRN
  Administered 2021-06-18: 10 mL

## 2021-06-18 MED ORDER — IBUPROFEN 400 MG PO TABS: 200.0000 mg | ORAL_TABLET | Freq: Four times a day (QID) | ORAL | 0 refills | Status: DC | PRN

## 2021-06-18 MED ORDER — ACETAMINOPHEN 500 MG PO TABS
ORAL_TABLET | ORAL | Status: AC
  Filled 2021-06-18: qty 2

## 2021-06-18 MED ORDER — LACTATED RINGERS IV SOLN: INTRAVENOUS | Status: DC

## 2021-06-18 MED ORDER — ACETAMINOPHEN 500 MG PO TABS
1000.0000 mg | ORAL_TABLET | ORAL | Status: AC
  Administered 2021-06-18: 1000 mg via ORAL

## 2021-06-18 MED ORDER — POVIDONE-IODINE 10 % EX SWAB: 2.0000 "application " | Freq: Once | CUTANEOUS | Status: DC

## 2021-06-18 MED ORDER — CEFAZOLIN SODIUM 1 G IJ SOLR
INTRAMUSCULAR | Status: AC
  Filled 2021-06-18: qty 20

## 2021-06-18 MED ORDER — ONDANSETRON HCL 4 MG/2ML IJ SOLN
INTRAMUSCULAR | Status: DC | PRN
  Administered 2021-06-18: 4 mg via INTRAVENOUS

## 2021-06-18 MED ORDER — FENTANYL CITRATE (PF) 100 MCG/2ML IJ SOLN
INTRAMUSCULAR | Status: DC | PRN
  Administered 2021-06-18: 50 ug via INTRAVENOUS

## 2021-06-18 MED ORDER — LIDOCAINE HCL (CARDIAC) PF 100 MG/5ML IV SOSY
PREFILLED_SYRINGE | INTRAVENOUS | Status: DC | PRN
  Administered 2021-06-18: 80 mg via INTRAVENOUS

## 2021-06-18 MED ORDER — FENTANYL CITRATE (PF) 100 MCG/2ML IJ SOLN: 25.0000 ug | INTRAMUSCULAR | Status: DC | PRN

## 2021-06-18 MED ORDER — PHENYLEPHRINE HCL (PRESSORS) 10 MG/ML IV SOLN
INTRAVENOUS | Status: DC | PRN
  Administered 2021-06-18 (×2): 80 ug via INTRAVENOUS

## 2021-06-18 MED ORDER — FENTANYL CITRATE (PF) 100 MCG/2ML IJ SOLN
INTRAMUSCULAR | Status: AC
  Filled 2021-06-18: qty 2

## 2021-06-18 MED ORDER — PROPOFOL 10 MG/ML IV BOLUS
INTRAVENOUS | Status: DC | PRN
  Administered 2021-06-18: 150 mg via INTRAVENOUS

## 2021-06-18 MED ORDER — SODIUM CHLORIDE 0.9 % IR SOLN
Status: DC | PRN
  Administered 2021-06-18: 3000 mL

## 2021-06-18 MED ORDER — PROPOFOL 10 MG/ML IV BOLUS
INTRAVENOUS | Status: AC
  Filled 2021-06-18: qty 20

## 2021-06-18 MED ORDER — EPHEDRINE SULFATE 50 MG/ML IJ SOLN
INTRAMUSCULAR | Status: DC | PRN
  Administered 2021-06-18 (×2): 5 mg via INTRAVENOUS

## 2021-06-18 SURGICAL SUPPLY — 19 items
CATH ROBINSON RED A/P 16FR (CATHETERS) ×2 IMPLANT
DEVICE MYOSURE LITE (MISCELLANEOUS) IMPLANT
DEVICE MYOSURE REACH (MISCELLANEOUS) ×1 IMPLANT
DILATOR CANAL MILEX (MISCELLANEOUS) IMPLANT
GAUZE 4X4 16PLY ~~LOC~~+RFID DBL (SPONGE) ×4 IMPLANT
GLOVE SURG ENC MOIS LTX SZ6.5 (GLOVE) ×2 IMPLANT
GLOVE SURG UNDER POLY LF SZ6.5 (GLOVE) ×2 IMPLANT
GLOVE SURG UNDER POLY LF SZ7.5 (GLOVE) ×1 IMPLANT
GOWN STRL REUS W/TWL LRG LVL3 (GOWN DISPOSABLE) ×3 IMPLANT
IV NS IRRIG 3000ML ARTHROMATIC (IV SOLUTION) ×2 IMPLANT
KIT PROCEDURE FLUENT (KITS) ×2 IMPLANT
KIT TURNOVER CYSTO (KITS) ×2 IMPLANT
MYOSURE XL FIBROID (MISCELLANEOUS)
PACK VAGINAL MINOR WOMEN LF (CUSTOM PROCEDURE TRAY) ×2 IMPLANT
PAD OB MATERNITY 4.3X12.25 (PERSONAL CARE ITEMS) ×2 IMPLANT
SEAL CERVICAL OMNI LOK (ABLATOR) IMPLANT
SEAL ROD LENS SCOPE MYOSURE (ABLATOR) ×2 IMPLANT
SYSTEM TISS REMOVAL MYOSURE XL (MISCELLANEOUS) IMPLANT
TOWEL OR 17X26 10 PK STRL BLUE (TOWEL DISPOSABLE) ×2 IMPLANT

## 2021-06-18 NOTE — Anesthesia Procedure Notes (Signed)
Procedure Name: LMA Insertion Date/Time: 06/18/2021 11:58 AM Performed by: Georgeanne Nim, CRNA Pre-anesthesia Checklist: Patient identified, Emergency Drugs available, Suction available, Patient being monitored and Timeout performed Patient Re-evaluated:Patient Re-evaluated prior to induction Oxygen Delivery Method: Circle system utilized Preoxygenation: Pre-oxygenation with 100% oxygen Induction Type: IV induction Ventilation: Mask ventilation without difficulty LMA: LMA inserted LMA Size: 4.0 Number of attempts: 1 Placement Confirmation: positive ETCO2, CO2 detector and breath sounds checked- equal and bilateral Tube secured with: Tape Dental Injury: Teeth and Oropharynx as per pre-operative assessment

## 2021-06-18 NOTE — Progress Notes (Signed)
Update to History and Physical  No marked change in status since office pre-op visit.   Patient examined.   OK to proceed with surgery. 

## 2021-06-18 NOTE — Anesthesia Postprocedure Evaluation (Signed)
Anesthesia Post Note  Patient: Karla Willis  Procedure(s) Performed: DILATATION & CURETTAGE/HYSTEROSCOPY WITH MYOSURE RESECTION FOR POLYPS (Vagina )     Patient location during evaluation: PACU Anesthesia Type: General Level of consciousness: awake Pain management: pain level controlled Respiratory status: spontaneous breathing Cardiovascular status: stable Anesthetic complications: no   No notable events documented.  Last Vitals:  Vitals:   06/18/21 1245 06/18/21 1300  BP: (!) 148/71 137/79  Pulse: 83 84  Resp: 15 12  Temp:    SpO2: 94% 95%    Last Pain:  Vitals:   06/18/21 1300  TempSrc:   PainSc: 0-No pain                 Lavaughn Haberle

## 2021-06-18 NOTE — Discharge Instructions (Addendum)
Hello Ms. Teffeteller,   I found and removed multiple areas that looked like tiny polyps.  I also did the curettage of the endometrial tissue.   Everything went well today!  I will see you in the office in about 2 weeks.   Josefa Half, MD   Post Anesthesia Home Care Instructions  Activity: Get plenty of rest for the remainder of the day. A responsible individual must stay with you for 24 hours following the procedure.  For the next 24 hours, DO NOT: -Drive a car -Paediatric nurse -Drink alcoholic beverages -Take any medication unless instructed by your physician -Make any legal decisions or sign important papers.  Meals: Start with liquid foods such as gelatin or soup. Progress to regular foods as tolerated. Avoid greasy, spicy, heavy foods. If nausea and/or vomiting occur, drink only clear liquids until the nausea and/or vomiting subsides. Call your physician if vomiting continues.  Special Instructions/Symptoms: Your throat may feel dry or sore from the anesthesia or the breathing tube placed in your throat during surgery. If this causes discomfort, gargle with warm salt water. The discomfort should disappear within 24 hours.

## 2021-06-18 NOTE — Op Note (Signed)
OPERATIVE REPORT  PREOPERATIVE DIAGNOSES:   Postmenopausal bleeding, endometrial polyp  POSTOPERATIVE DIAGNOSES:   Postmenopausal bleeding, endometrial polyp  PROCEDURE:  Hysteroscopy with dilation and curettage and Myosure resection of endometrial polyps  SURGEON:  Lenard Galloway, MD  ANESTHESIA:  LMA, paracervical block with 10 mL of 1% lidocaine.  IV FLUIDS:  700 cc LR  EBL:   5 cc  URINE OUTPUT:  100 cc  NORMAL SALINE DEFICIT:  75 cc  COMPLICATIONS:  None.  INDICATIONS FOR THE PROCEDURE:     The patient is an 86 year old G5P3 African American Female who presents with recurrent postmenopausal bleeding.  The patient had postmenopausal bleeding in December, 2020 and had endometrial biopsy in January, 2021 that showed a benign endometrial polyp.  She did not present for follow up care until June, 2022 when she presented again with bleeding.  Pelvic ultrasound showed an endometrium measuring 10.70 mm, an intramural fibroid measuring 3.29 cm., and normal ovaries.   An endometrial biopsy showed a benign endometrial polyp.   A plan is now made to proceed with a hysteroscopy with dilation and curettage and Myosurgical resection of the endometrial polyp, after risks, benefits and alternatives were reviewed.  FINDINGS:  Exam under anesthesia revealed a small, mobile uterus.  No adnexal masses were noted.  The uterus sounded to 7.5 cm.  Hysteroscopy showed multiple tiny polyps throughout the cavity.   There was webbing of the endometrial tissue with polyp type formation, more in the right fundus than the left fundus. The right tubal ostial region contained a multiple small channels covering it, and the left tubal ostial region looked stenotic and closed. Endometrial currettings were scant.   SPECIMENS:  The endometrial polyps were sent to pathology separately from the endometrial curettings.   PROCEDURE IN DETAIL:  The patient was reidentified in the preoperative hold area.  She  received TED hose and PAS stockings for DVT prophylaxis.  In the operating room, the patient was placed in the dorsal lithotomy position and then an LMA anesthetic was introduced.  The patient's lower abdomen, vagina and perineum were sterilely prepped with Betadine and the  patient's bladder was catheterized of urine.  She was sterilly draped  An exam under anesthesia was performed.  A speculum was placed inside the vagina and a single-tooth tenaculum was placed on the anterior cervical lip.  A paracervical block was performed with a total of 10 mL of 1% lidocaine plain.  The uterus was sounded. The cervix was dilated to a #19 Pratt dilator.  The MyoSure hysteroscope was then inserted inside the uterine cavity under the continuous infusion of normal saline solution.   Findings are as noted above.  The Myosure Reach device was used to resect the endometrial polyps without difficulty.  The MyoSure hysteroscope was removed. The cervix was further dilated to a #23 Pratt dilator.   The serrated and then sharp curettes were introduced into the uterine cavity and the endometrium was curetted in all 4 quadrants.   A minimal amount of endometrial curettings was obtained.  This specimen was sent to Pathology.  The single-tooth tenaculum which had been placed on the anterior cervical lip was removed.     Hemostasis was good, and all of the vaginal instruments were removed.  The patient was awakened and escorted to the recovery room in stable condition after she was cleansed of Betadine.  There were no complications to the procedure.   All needle, instrument and sponge counts were correct.  Lenard Galloway, MD

## 2021-06-18 NOTE — Transfer of Care (Signed)
Immediate Anesthesia Transfer of Care Note  Patient: Karla Willis  Procedure(s) Performed: DILATATION & CURETTAGE/HYSTEROSCOPY WITH MYOSURE RESECTION FOR POLYPS (Vagina )  Patient Location: PACU  Anesthesia Type:General  Level of Consciousness: awake, alert , oriented and patient cooperative  Airway & Oxygen Therapy: Patient Spontanous Breathing and Patient connected to nasal cannula oxygen  Post-op Assessment: Report given to RN and Post -op Vital signs reviewed and stable  Post vital signs: Reviewed and stable  Last Vitals:  Vitals Value Taken Time  BP    Temp    Pulse    Resp    SpO2      Last Pain:  Vitals:   06/18/21 1003  TempSrc: Oral  PainSc: 0-No pain         Complications: No notable events documented.

## 2021-06-18 NOTE — Anesthesia Preprocedure Evaluation (Signed)
Anesthesia Evaluation  Patient identified by MRN, date of birth, ID band Patient awake    Reviewed: Allergy & Precautions, NPO status , Patient's Chart, lab work & pertinent test results  Airway Mallampati: II       Dental   Pulmonary neg pulmonary ROS,    breath sounds clear to auscultation       Cardiovascular hypertension,  Rhythm:Regular Rate:Normal     Neuro/Psych negative neurological ROS     GI/Hepatic Neg liver ROS, hiatal hernia, GERD  ,  Endo/Other  diabetesHypothyroidism   Renal/GU Renal disease     Musculoskeletal   Abdominal   Peds  Hematology   Anesthesia Other Findings   Reproductive/Obstetrics                             Anesthesia Physical Anesthesia Plan  ASA: 3  Anesthesia Plan: General   Post-op Pain Management:    Induction: Intravenous  PONV Risk Score and Plan: 3 and Ondansetron, Dexamethasone and Midazolam  Airway Management Planned: LMA  Additional Equipment:   Intra-op Plan:   Post-operative Plan: Extubation in OR  Informed Consent: I have reviewed the patients History and Physical, chart, labs and discussed the procedure including the risks, benefits and alternatives for the proposed anesthesia with the patient or authorized representative who has indicated his/her understanding and acceptance.     Dental advisory given  Plan Discussed with: CRNA and Anesthesiologist  Anesthesia Plan Comments:         Anesthesia Quick Evaluation

## 2021-06-19 ENCOUNTER — Ambulatory Visit: Payer: Medicare Other | Admitting: Nurse Practitioner

## 2021-06-19 ENCOUNTER — Encounter (HOSPITAL_BASED_OUTPATIENT_CLINIC_OR_DEPARTMENT_OTHER): Payer: Self-pay | Admitting: Obstetrics and Gynecology

## 2021-06-20 LAB — SURGICAL PATHOLOGY

## 2021-06-21 ENCOUNTER — Other Ambulatory Visit: Payer: Self-pay | Admitting: Nurse Practitioner

## 2021-06-24 ENCOUNTER — Telehealth: Payer: Self-pay

## 2021-06-24 NOTE — Telephone Encounter (Signed)
Spoke with patient and advised her. ?

## 2021-06-24 NOTE — Telephone Encounter (Signed)
Patient had D&C Hysteroscopy with resection of polyps on 06/18/21.  She reports bleeding started Saturday. Saturday night very heavy and cramping. Had saturated her Depends and got some blood on sheet. Got up and changed and by morning she had to change again.  Then Sunday was lighter and stopped bleeding. No bleeding today either.  What to rec?

## 2021-06-24 NOTE — Telephone Encounter (Signed)
Because the bleeding has stopped, she can keep her two week post op visit.  Please have her come in sooner if the heavy bleeding resumes.   Her final surgical pathology showed benign polyp.

## 2021-06-28 ENCOUNTER — Ambulatory Visit (INDEPENDENT_AMBULATORY_CARE_PROVIDER_SITE_OTHER): Payer: Medicare Other | Admitting: Nurse Practitioner

## 2021-06-28 ENCOUNTER — Encounter: Payer: Self-pay | Admitting: Nurse Practitioner

## 2021-06-28 ENCOUNTER — Other Ambulatory Visit: Payer: Self-pay

## 2021-06-28 VITALS — BP 134/80 | HR 96 | Temp 97.9°F | Ht 63.0 in | Wt 260.0 lb

## 2021-06-28 DIAGNOSIS — I10 Essential (primary) hypertension: Secondary | ICD-10-CM | POA: Diagnosis not present

## 2021-06-28 DIAGNOSIS — H5710 Ocular pain, unspecified eye: Secondary | ICD-10-CM | POA: Diagnosis not present

## 2021-06-28 DIAGNOSIS — E034 Atrophy of thyroid (acquired): Secondary | ICD-10-CM | POA: Diagnosis not present

## 2021-06-28 DIAGNOSIS — E785 Hyperlipidemia, unspecified: Secondary | ICD-10-CM | POA: Diagnosis not present

## 2021-06-28 DIAGNOSIS — M1000 Idiopathic gout, unspecified site: Secondary | ICD-10-CM

## 2021-06-28 DIAGNOSIS — N182 Chronic kidney disease, stage 2 (mild): Secondary | ICD-10-CM

## 2021-06-28 DIAGNOSIS — N939 Abnormal uterine and vaginal bleeding, unspecified: Secondary | ICD-10-CM | POA: Diagnosis not present

## 2021-06-28 DIAGNOSIS — E1122 Type 2 diabetes mellitus with diabetic chronic kidney disease: Secondary | ICD-10-CM

## 2021-06-28 DIAGNOSIS — Z6841 Body Mass Index (BMI) 40.0 and over, adult: Secondary | ICD-10-CM

## 2021-06-28 NOTE — Patient Instructions (Addendum)
Sign record release for ophthalmologist (dr Katy Fitch)   Call ophthalmologist and schedule eye appt due to eye being sore.

## 2021-06-28 NOTE — Progress Notes (Signed)
Careteam: Patient Care Team: Lauree Chandler, NP as PCP - General (Geriatric Medicine) Nicholas Lose, MD as Consulting Physician (Hematology and Oncology) Specialists, [place] as Consulting Physician (Orthopedic Surgery)  PLACE OF SERVICE:  [place] Directive information Does Patient Have a Medical Advance Directive?: Yes, Type of Advance Directive: [place];Out of facility DNR (pink MOST or yellow form), Pre-existing out of facility DNR order (yellow form or pink MOST form): Pink MOST form placed in chart (order not valid for inpatient use), Does patient want to make changes to medical advance directive?: No - Patient declined  Allergies  Allergen Reactions   Actos [Pioglitazone] Swelling   Hydrocodone Nausea Only and Other (See Comments)    "bloated"   Codeine Rash   Neurontin [Gabapentin] Palpitations and Other (See Comments)    dizziness   Other Rash    Nuts--PECANS   Peanut-Containing Drug Products Rash    Chief Complaint  Patient presents with   Medical Management of Chronic Issues    3 month follow-up and discuss need for shingrix, covid, flu (not in stock at [facility]) vaccines, and eye exam or exclude. Pain off/on on the right temple, pain does not last long.      HPI: Patient is a 86 y.o. female for routine follow up.   Recently had D&C due to vaginal bleeding. She had post procedural bleeding but has stopped now. Found to have benign polyp.   Continues to be very active, Driving her granddaughter around today.   DM- no low blood sugars.   Hyperlipidemia- LDL was not at goal on last labs, she has started lipitor and tolerating well.   No recent gout flares.   Reports headaches (ongoing for a few months)  Reports eye is sore 3 weeks ago. No injury. No changes in vision. No weakness. No red eye. Does not feel like there is an associating between eye soreness and headache.   Review of Systems:  Review of Systems   Constitutional:  Negative for chills, fever and weight loss.  HENT:  Negative for tinnitus.   Eyes:  Positive for pain (reports eye is sore). Negative for blurred vision, double vision, photophobia, discharge and redness.  Respiratory:  Negative for cough, sputum production and shortness of breath.   Cardiovascular:  Negative for chest pain, palpitations and leg swelling.  Gastrointestinal:  Negative for abdominal pain, constipation, diarrhea and heartburn.  Genitourinary:  Negative for dysuria, frequency and urgency.  Musculoskeletal:  Negative for back pain, falls, joint pain and myalgias.  Skin: Negative.   Neurological:  Positive for headaches. Negative for dizziness.  Psychiatric/Behavioral:  Negative for depression and memory loss. The patient does not have insomnia.    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   right upper inner- DCIS, 12-16-2013  s/p right breast lumpectomy with two re-excision's,  no chemo/ radiation and completed anti-estrogen therapy 06/ 2020;  oncologist-- dr [name]--  released pt per lov note in epic 05-01-2020   Endometrial polyp    Essential tremor    occasional hand tremor   Full dentures    GERD (gastroesophageal reflux disease)    rarely takes tums   Hiatal hernia    History of  2019 novel coronavirus disease (COVID-19)    07/ 2020  w/ moderate symptoms that resolved ,  and 2021 w/ mild symptoms;  no hospitalzation for with time but has chronic occasional dry cough since   Hypertension    Hypothyroidism, postsurgical 1970   followed by pcp   OAB (overactive bladder)    Osteopenia    PMB (postmenopausal bleeding)    Type 2 diabetes mellitus ([place])    followed by pcp   (06-13-2021  per pt only checks once per week, fasting sugar-- 101-120)   Wears  hearing aid in both ears    per pt only aid working is right side, but does not wear all the time   Past Surgical History:  Procedure Laterality Date   BREAST BIOPSY Right 10/26/2013   BREAST LUMPECTOMY WITH NEEDLE LOCALIZATION Right 12/16/2013   Procedure: BREAST LUMPECTOMY WITH NEEDLE LOCALIZATION;  Surgeon: Edward Jolly, MD;  Location: [place];  Service: General;  Laterality: Right;   CATARACT EXTRACTION W/ INTRAOCULAR LENS IMPLANT Bilateral 2019   [place] N/A 06/18/2021   Procedure: [place];  Surgeon: [name], MD;  Location: [place];  Service: Gynecology;  Laterality: N/A;   DILATION AND CURETTAGE OF UTERUS  09/2000   '@[facility]'$  by dr [name]   FINGER SURGERY     right hand-   RE-EXCISION OF BREAST LUMPECTOMY Right 01/30/2014   Procedure: RE-EXCISION OF BREAST LUMPECTOMY;  Surgeon: Edward Jolly, MD;  Location: [place];  Service: General;  Laterality: Right;   RE-EXCISION OF BREAST LUMPECTOMY Right 03/06/2014   Procedure: RE-EXCISION OF BREAST LUMPECTOMY;  Surgeon: Edward Jolly, MD;  Location: [place];  Service: General;  Laterality: Right;   THYROID LOBECTOMY  1970   unilateral   TOTAL SHOULDER ARTHROPLASTY Left 03/04/2011   '@[facility]'$    TOTAL SHOULDER ARTHROPLASTY Right 05/21/2016   Procedure: RIGHT TOTAL SHOULDER ARTHROPLASTY;  Surgeon: Ninetta Lights, MD;  Location: [place];  Service: Orthopedics;  Laterality: Right;   TUBAL LIGATION     yrs ago   Social History:   reports that she has never smoked. She has never used smokeless tobacco. She reports that she does not drink alcohol and does not use drugs.  Family History  Problem Relation Age of Onset   Diabetes Mother    Arthritis Father    Seizures Sister    Colon cancer Sister     Pneumonia Daughter    Cancer Sister        breast   Breast cancer Sister    Multiple sclerosis Son        Deceased     Medications: Patient's Medications  New Prescriptions   No medications on file  Previous Medications   ACETAMINOPHEN (TYLENOL) 500 MG TABLET    Take 500 mg by mouth as needed.   ALLOPURINOL (ZYLOPRIM) 100 MG TABLET    TAKE 1 TABLET BY MOUTH EVERY DAY FOR GOUT   AMLODIPINE (NORVASC) 10 MG TABLET    Take 1 tablet (10 mg total) by mouth daily.   ATORVASTATIN (LIPITOR) 10 MG TABLET    TAKE 1 TABLET(10 MG) BY MOUTH DAILY   CALCIUM-VITAMIN D PO    Take 1 tablet by mouth daily.   DICLOFENAC SODIUM (VOLTAREN) 1 % GEL    Apply 4 g topically 4 (  four) times daily as needed.   DULOXETINE (CYMBALTA) 20 MG CAPSULE    TAKE 1 CAPSULE(20 MG) BY MOUTH DAILY   GLIPIZIDE (GLUCOTROL XL) 5 MG 24 HR TABLET    TAKE 1 TABLET BY MOUTH EVERY DAY WITH BREAKFAST   IBUPROFEN (ADVIL) 400 MG TABLET    Take 0.5 tablets (200 mg total) by mouth every 6 (six) hours as needed.   LEVOTHYROXINE (SYNTHROID) 100 MCG TABLET    TAKE 1 TABLET BY MOUTH EVERY DAY 30 MINUTES BEFORE BREAKFAST ON AN EMPTY STOMACH   LOSARTAN (COZAAR) 50 MG TABLET    TAKE 1 TABLET(50 MG) BY MOUTH DAILY   TRIAMCINOLONE CREAM (KENALOG) 0.1 %    APPLY EXTERNALLY TO THE AFFECTED AREA TWICE DAILY  Modified Medications   No medications on file  Discontinued Medications   No medications on file    Physical Exam:  Vitals:   06/28/21 1053  BP: 134/80  Pulse: 96  Temp: 97.9 F (36.6 C)  TempSrc: Temporal  SpO2: 98%  Weight: 260 lb (117.9 kg)  Height: '5\' 3"'$  (1.6 m)   Body mass index is 46.06 kg/m. Wt Readings from Last 3 Encounters:  06/28/21 260 lb (117.9 kg)  06/18/21 257 lb 4.8 oz (116.7 kg)  06/14/21 263 lb (119.3 kg)    Physical Exam Constitutional:      General: She is not in acute distress.    Appearance: She is well-developed. She is not diaphoretic.  HENT:     Head: Normocephalic and atraumatic.     Nose: Nose  normal. No congestion.     Mouth/Throat:     Pharynx: No oropharyngeal exudate.  Eyes:     General:        Right eye: No discharge.        Left eye: No discharge.     Extraocular Movements: Extraocular movements intact.     Conjunctiva/sclera: Conjunctivae normal.     Pupils: Pupils are equal, round, and reactive to light.  Cardiovascular:     Rate and Rhythm: Normal rate and regular rhythm.     Heart sounds: Normal heart sounds.  Pulmonary:     Effort: Pulmonary effort is normal.     Breath sounds: Normal breath sounds.  Abdominal:     General: Bowel sounds are normal.     Palpations: Abdomen is soft.  Musculoskeletal:     Cervical back: Normal range of motion and neck supple.     Right lower leg: No edema.     Left lower leg: No edema.  Skin:    General: Skin is warm and dry.  Neurological:     General: No focal deficit present.     Mental Status: She is alert and oriented to person, place, and time.  Psychiatric:        Mood and Affect: Mood normal.    Labs reviewed: Basic Metabolic Panel: Recent Labs    10/12/20 1140 02/15/21 1154 06/18/21 1023  NA 141 140 140  K 4.4 4.3 3.6  CL 107 107 107  CO2 '25 26 25  '$ GLUCOSE 104* 129* 121*  BUN '22 15 13  '$ CREATININE 1.29* 1.23* 0.95  CALCIUM 9.8 10.1 10.1  TSH  --  1.21  --    Liver Function Tests: Recent Labs    10/12/20 1140 02/15/21 1154  AST 15 19  ALT 13 15  BILITOT 0.3 0.4  PROT 7.0 7.3   No results for input(s): LIPASE, AMYLASE in the last 8760 hours. No  results for input(s): AMMONIA in the last 8760 hours. CBC: Recent Labs    10/12/20 1140 02/15/21 1154 04/29/21 0000 06/18/21 1023  WBC 6.4 6.0 6.6 5.3  NEUTROABS 3,917 3,570 3,769  --   HGB 12.9 13.3 12.9 12.0  HCT 39.3 40.8 40.5 35.4*  MCV 89.1 89.5 89.6 86.1  PLT 207 205 222 205   Lipid Panel: Recent Labs    10/12/20 1140  CHOL 182  HDL 55  LDLCALC 105*  TRIG 119  CHOLHDL 3.3   TSH: Recent Labs    02/15/21 1154  TSH 1.21    A1C: Lab Results  Component Value Date   HGBA1C 6.5 (H) 02/15/2021     Assessment/Plan 1. Idiopathic gout, unspecified chronicity, unspecified site -no recent flares. Continues allopurinol.   2. Vaginal bleeding S/p D&C had significant bleeding after but has since resolved. Continue to monitor.   3. Type 2 diabetes mellitus with stage 2 chronic kidney disease, without long-term current use of insulin (HCC) -continues on glipizide daily, without hypoglycemia. Encouraged dietary compliance, routine foot care/monitoring and to keep up with diabetic eye exams through ophthalmology  - Hemoglobin A1c  4. Essential hypertension, benign --stable. Goal bp <140/90. Continue on norvasc and losartan with low sodium diet.  - CBC with Differential/Platelet  5. Class 3 severe obesity due to excess calories with serious comorbidity and body mass index (BMI) of 45.0 to 49.9 in adult [facility]) -education provided on healthy weight loss through increase in physical activity and proper nutrition    6. Hyperlipidemia LDL goal <70 --started on lipitor. Will follow up lipids. Continue dietary modifications with medication management.  - Lipid panel  7. Hypothyroid -tsh at goal. Continues on synthroid 100 mcg   8. Soreness of eye -no changes in vision however reports soreness in eye ongoing for 3 weeks. She is up to date on eye exam however agreeable to call and make appt due to new symptom.   Next appt: 3 months. Carlos American. [place], [facility] [phone]

## 2021-06-29 LAB — CBC WITH DIFFERENTIAL/PLATELET
Absolute Monocytes: 676 cells/uL (ref 200–950)
Basophils Absolute: 37 cells/uL (ref 0–200)
Basophils Relative: 0.6 %
Eosinophils Absolute: 341 cells/uL (ref 15–500)
Eosinophils Relative: 5.5 %
HCT: 39 % (ref 35.0–45.0)
Hemoglobin: 12.4 g/dL (ref 11.7–15.5)
Lymphs Abs: 1593 cells/uL (ref 850–3900)
MCH: 29.1 pg (ref 27.0–33.0)
MCHC: 31.8 g/dL — ABNORMAL LOW (ref 32.0–36.0)
MCV: 91.5 fL (ref 80.0–100.0)
MPV: 10.8 fL (ref 7.5–12.5)
Monocytes Relative: 10.9 %
Neutro Abs: 3553 cells/uL (ref 1500–7800)
Neutrophils Relative %: 57.3 %
Platelets: 222 10*3/uL (ref 140–400)
RBC: 4.26 10*6/uL (ref 3.80–5.10)
RDW: 14.2 % (ref 11.0–15.0)
Total Lymphocyte: 25.7 %
WBC: 6.2 10*3/uL (ref 3.8–10.8)

## 2021-06-29 LAB — HEMOGLOBIN A1C
Hgb A1c MFr Bld: 6.2 % of total Hgb — ABNORMAL HIGH (ref <5.7)
Mean Plasma Glucose: 131 mg/dL
eAG (mmol/L): 7.3 mmol/L

## 2021-06-29 LAB — LIPID PANEL
Cholesterol: 124 mg/dL (ref <200)
HDL: 60 mg/dL (ref >=50)
LDL Cholesterol (Calc): 47 mg/dL (calc)
Non-HDL Cholesterol (Calc): 64 mg/dL (calc) (ref <130)
Total CHOL/HDL Ratio: 2.1 (calc) (ref <5.0)
Triglycerides: 91 mg/dL (ref <150)

## 2021-07-02 NOTE — Progress Notes (Signed)
GYNECOLOGY  VISIT   HPI: 86 y.o.   Widowed  [place]  female   [phone] with No LMP recorded. Patient is postmenopausal.   here for 2 weeks status post DILATATION & CURETTAGE/HYSTEROSCOPY WITH MYOSURE RESECTION FOR POLYPS ([place] ).  Pathology:  benign endometrial polyp.  Bled for 1. 5 days after surgery and started bleeding again a few days later.  Now stopped.  No pain medication use.   Bladder, bowel working well.    GYNECOLOGIC HISTORY: No LMP recorded. Patient is postmenopausal. Contraception: PMP Menopausal hormone therapy:  none Last mammogram: 05-16-20 Diag.Bil/Neg/BiRads2 Last pap smear: 11-15-19 Neg, 11-26-12 Neg        OB History     Gravida  5   Para  4   Term      Preterm      AB  1   Living  3      SAB      IAB  1   Ectopic      Multiple      Live Births                 Patient Active Problem List   Diagnosis Date Noted   Snoring 02/14/2020   Insomnia 02/14/2020   History of breast cancer 05/20/2017   Idiopathic gout 05/20/2017   Hyperlipidemia LDL goal <100 05/20/2017   Bilateral edema of lower extremity 05/20/2017   Acute renal failure (ARF) (HCC)    Acute on chronic renal failure ([place]) 05/25/2016   ARF (acute renal failure) ([place]) 05/25/2016   Dyspnea on exertion 05/25/2016   Anemia 05/25/2016   Localized primary osteoarthritis of right shoulder region 05/21/2016   Osteoarthritis, multiple sites 03/09/2015   Hypothyroidism 03/09/2015   Goiter 03/09/2015   Essential hypertension, benign 03/09/2015   Diabetes mellitus, type 2 ([place]) 03/09/2015   DCIS (ductal carcinoma in situ) of breast 11/11/2013    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   right upper inner- DCIS, 12-16-2013  s/p right breast lumpectomy with two  re-excision's,  no chemo/ radiation and completed anti-estrogen therapy 06/ 2020;  oncologist-- dr [name]--  released pt per lov note in epic 05-01-2020   Endometrial polyp    Essential tremor    occasional hand tremor   Full dentures    GERD (gastroesophageal reflux disease)    rarely takes tums   Hiatal hernia    History of 2019 novel coronavirus disease (COVID-19)    07/ 2020  w/ moderate symptoms that resolved ,  and 2021 w/ mild symptoms;  no hospitalzation for with time but has chronic occasional dry cough since   Hypertension    Hypothyroidism, postsurgical 1970   followed by pcp   OAB (overactive bladder)    Osteopenia    PMB (postmenopausal bleeding)    Type 2 diabetes mellitus ([place])    followed by pcp   (06-13-2021  per pt only checks once per week, fasting sugar-- 101-120)   Wears hearing aid in both ears    per pt only aid working is right side, but does not wear all the time    Past Surgical History:  Procedure Laterality Date   BREAST BIOPSY Right 10/26/2013   BREAST LUMPECTOMY WITH  NEEDLE LOCALIZATION Right 12/16/2013   Procedure: BREAST LUMPECTOMY WITH NEEDLE LOCALIZATION;  Surgeon: Edward Jolly, MD;  Location: [place];  Service: General;  Laterality: Right;   CATARACT EXTRACTION W/ INTRAOCULAR LENS IMPLANT Bilateral 2019   CHOLECYSTECTOMY OPEN  1970   COLONOSCOPY     DILATATION & CURETTAGE/HYSTEROSCOPY WITH MYOSURE N/A 06/18/2021   Procedure: DILATATION & CURETTAGE/HYSTEROSCOPY WITH MYOSURE RESECTION FOR POLYPS;  Surgeon: [name], MD;  Location: [place];  Service: Gynecology;  Laterality: N/A;   DILATION AND CURETTAGE OF UTERUS  09/2000   '@[facility]'$  by dr [name]   FINGER SURGERY     right hand-   RE-EXCISION OF BREAST LUMPECTOMY Right 01/30/2014   Procedure: RE-EXCISION OF BREAST LUMPECTOMY;  Surgeon: Edward Jolly, MD;  Location: [place];  Service: General;  Laterality: Right;    RE-EXCISION OF BREAST LUMPECTOMY Right 03/06/2014   Procedure: RE-EXCISION OF BREAST LUMPECTOMY;  Surgeon: Edward Jolly, MD;  Location: [place];  Service: General;  Laterality: Right;   THYROID LOBECTOMY  1970   unilateral   TOTAL SHOULDER ARTHROPLASTY Left 03/04/2011   '@[facility]'$    TOTAL SHOULDER ARTHROPLASTY Right 05/21/2016   Procedure: RIGHT TOTAL SHOULDER ARTHROPLASTY;  Surgeon: Ninetta Lights, MD;  Location: [place];  Service: Orthopedics;  Laterality: Right;   TUBAL LIGATION     yrs ago    Current Outpatient Medications  Medication Sig Dispense Refill   acetaminophen (TYLENOL) 500 MG tablet Take 500 mg by mouth as needed.     allopurinol (ZYLOPRIM) 100 MG tablet TAKE 1 TABLET BY MOUTH EVERY DAY FOR GOUT 90 tablet 1   amLODipine (NORVASC) 10 MG tablet Take 1 tablet (10 mg total) by mouth daily. 90 tablet 3   atorvastatin (LIPITOR) 10 MG tablet TAKE 1 TABLET(10 MG) BY MOUTH DAILY 90 tablet 1   CALCIUM-VITAMIN D PO Take 1 tablet by mouth daily.     diclofenac sodium (VOLTAREN) 1 % GEL Apply 4 g topically 4 (four) times daily as needed. 100 g 0   DULoxetine (CYMBALTA) 20 MG capsule TAKE 1 CAPSULE(20 MG) BY MOUTH DAILY 30 capsule 3   glipiZIDE (GLUCOTROL XL) 5 MG 24 hr tablet TAKE 1 TABLET BY MOUTH EVERY DAY WITH BREAKFAST 90 tablet 1   ibuprofen (ADVIL) 400 MG tablet Take 0.5 tablets (200 mg total) by mouth every 6 (six) hours as needed. 30 tablet 0   levothyroxine (SYNTHROID) 100 MCG tablet TAKE 1 TABLET BY MOUTH EVERY DAY 30 MINUTES BEFORE BREAKFAST ON AN EMPTY STOMACH 90 tablet 1   losartan (COZAAR) 50 MG tablet TAKE 1 TABLET(50 MG) BY MOUTH DAILY 90 tablet 1   triamcinolone cream (KENALOG) 0.1 % APPLY EXTERNALLY TO THE AFFECTED AREA TWICE DAILY 45 g 3   No current facility-administered medications for this visit.     ALLERGIES: Actos [pioglitazone], Hydrocodone, Codeine, Neurontin [gabapentin], Other, and Peanut-containing drug products  Family History   Problem Relation Age of Onset   Diabetes Mother    Arthritis Father    Seizures Sister    Colon cancer Sister    Pneumonia Daughter    Cancer Sister        breast   Breast cancer Sister    Multiple sclerosis Son        Deceased     Social History   Socioeconomic History   Marital status: Widowed    Spouse name: Not on file   Number of  children: Not on file   Years of education: Not on file   Highest education level: Not on file  Occupational History   Not on file  Tobacco Use   Smoking status: Never   Smokeless tobacco: Never  Vaping Use   Vaping Use: Never used  Substance and Sexual Activity   Alcohol use: No    Alcohol/week: 0.0 standard drinks   Drug use: Never   Sexual activity: Not Currently    Comment: menarche age 67,1st intercourse 86 yo-Fewer than 5 partners  Other Topics Concern   Not on file  Social History Narrative   Diet: No      Do you drink/ eat things with caffeine? Yes      Marital status:    Widowed                           What year were you married ?  1956      Do you live in a house, apartment,assistred living, condo, trailer, etc.)?  apartment      Is it one or more stories?  one      How many persons live in your home ?  3      Do you have any pets in your home ?(please list)  No      Current or past profession:  Bank, Caregiver      Do you exercise?  No                            Type & how often:        Do you have a living will?  Yes      Do you have a DNR form?  Yes                     If not, do you want to discuss one?       Do you have signed POA?HPOA forms?                 If so, please bring to your        appointment      Social Determinants of Health   Financial Resource Strain: Not on file  Food Insecurity: Not on file  Transportation Needs: Not on file  Physical Activity: Not on file  Stress: Not on file  Social Connections: Not on file  Intimate Partner Violence: Not on file    Review of Systems  All  other systems reviewed and are negative.  PHYSICAL EXAMINATION:    BP 130/78   Pulse 90   Ht '5\' 3"'$  (1.6 m)   Wt 263 lb (119.3 kg)   SpO2 97%   BMI 46.59 kg/m     General appearance: alert, cooperative and appears stated age  Pelvic: External genitalia:  no lesions              Urethra:  normal appearing urethra with no masses, tenderness or lesions              Bartholins and Skenes: normal                 Vagina: normal appearing vagina with normal color and discharge, no lesions              Cervix: no lesions  Bimanual Exam:  Uterus:  normal size, contour, position, consistency, mobility, non-tender              Adnexa: no mass, fullness, tenderness            Chaperone was present for exam:  Estill Bamberg, CMA  ASSESSMENT  Status post hysteroscopy, Myosure resection of endometrial polyps, dilation and curettage.   PLAN  Surgical findings, procedure and pathology report reviewed with patient.  Call for any future vaginal bleeding.  She will schedule her mammogram appointment.  Return for breast and pelvic exam in January, 2022.   An After Visit Summary was printed and given to the patient.

## 2021-07-03 ENCOUNTER — Ambulatory Visit (INDEPENDENT_AMBULATORY_CARE_PROVIDER_SITE_OTHER): Payer: Medicare Other | Admitting: Obstetrics and Gynecology

## 2021-07-03 ENCOUNTER — Encounter: Payer: Self-pay | Admitting: Obstetrics and Gynecology

## 2021-07-03 ENCOUNTER — Other Ambulatory Visit: Payer: Self-pay

## 2021-07-03 VITALS — BP 130/78 | HR 90 | Ht 63.0 in | Wt 263.0 lb

## 2021-07-03 DIAGNOSIS — Z9889 Other specified postprocedural states: Secondary | ICD-10-CM

## 2021-07-04 ENCOUNTER — Other Ambulatory Visit: Payer: Self-pay | Admitting: Nurse Practitioner

## 2021-07-04 DIAGNOSIS — Z1231 Encounter for screening mammogram for malignant neoplasm of breast: Secondary | ICD-10-CM

## 2021-07-09 ENCOUNTER — Other Ambulatory Visit: Payer: Self-pay | Admitting: Nurse Practitioner

## 2021-07-09 DIAGNOSIS — G8929 Other chronic pain: Secondary | ICD-10-CM

## 2021-07-09 DIAGNOSIS — M5441 Lumbago with sciatica, right side: Secondary | ICD-10-CM

## 2021-07-11 ENCOUNTER — Other Ambulatory Visit: Payer: Self-pay | Admitting: Nurse Practitioner

## 2021-07-11 DIAGNOSIS — I10 Essential (primary) hypertension: Secondary | ICD-10-CM

## 2021-07-13 ENCOUNTER — Ambulatory Visit
Admission: RE | Admit: 2021-07-13 | Discharge: 2021-07-13 | Disposition: A | Discharge status: Home / Self Care | Payer: Medicare Other | Source: Ambulatory Visit | Attending: Nurse Practitioner | Admitting: Nurse Practitioner

## 2021-07-13 ENCOUNTER — Other Ambulatory Visit: Payer: Self-pay

## 2021-07-13 DIAGNOSIS — Z1231 Encounter for screening mammogram for malignant neoplasm of breast: Secondary | ICD-10-CM | POA: Diagnosis not present

## 2021-09-13 ENCOUNTER — Other Ambulatory Visit: Payer: Self-pay | Admitting: Nurse Practitioner

## 2021-09-13 DIAGNOSIS — E119 Type 2 diabetes mellitus without complications: Secondary | ICD-10-CM

## 2021-09-30 ENCOUNTER — Ambulatory Visit (INDEPENDENT_AMBULATORY_CARE_PROVIDER_SITE_OTHER): Payer: Medicare Other | Admitting: Orthopedic Surgery

## 2021-09-30 ENCOUNTER — Other Ambulatory Visit: Payer: Self-pay

## 2021-09-30 ENCOUNTER — Encounter: Payer: Self-pay | Admitting: Orthopedic Surgery

## 2021-09-30 VITALS — BP 140/80 | HR 86 | Temp 97.3°F | Resp 18 | Ht 63.0 in

## 2021-09-30 DIAGNOSIS — R531 Weakness: Secondary | ICD-10-CM

## 2021-09-30 DIAGNOSIS — R519 Headache, unspecified: Secondary | ICD-10-CM

## 2021-09-30 DIAGNOSIS — R6883 Chills (without fever): Secondary | ICD-10-CM | POA: Diagnosis not present

## 2021-09-30 DIAGNOSIS — R110 Nausea: Secondary | ICD-10-CM

## 2021-09-30 LAB — POCT INFLUENZA A/B
Influenza A, POC: NEGATIVE
Influenza B, POC: NEGATIVE

## 2021-09-30 NOTE — Progress Notes (Signed)
Careteam: Patient Care Team: Lauree Chandler, NP as PCP - General (Geriatric Medicine) Nicholas Lose, MD as Consulting Physician (Hematology and Oncology) Specialists, [place] as Consulting Physician (Orthopedic Surgery)  Seen by: Windell Moulding, AGNP-C  PLACE OF SERVICE:  [place] Directive information Does Patient Have a Medical Advance Directive?: Yes, Type of Advance Directive: [place];Living will;Out of facility DNR (pink MOST or yellow form), Does patient want to make changes to medical advance directive?: No - Patient declined  Allergies  Allergen Reactions   Actos [Pioglitazone] Swelling   Hydrocodone Nausea Only and Other (See Comments)    "bloated"   Codeine Rash   Neurontin [Gabapentin] Palpitations and Other (See Comments)    dizziness   Other Rash    Nuts--PECANS   Peanut-Containing Drug Products Rash    Chief Complaint  Patient presents with   Acute Visit    Patient complains of being out of town and getting sick. Patient states dizziness, and body chills.      HPI: Patient is a 86 y.o. female seen today for acute visit due to weakness, body chills and headaches.   09/28/2021 she noticed weakness and dizziness while trying to use the bathroom. She began using walker after incident. Reports weakness has improved today, but continues to use cane. No recent falls.     Body chills intermittent. Onset 09/28/2021. No episodes today. She has not checked her temperature while feeling unwell. Denies feeling feverish or other cold symptoms. Reports having flu vaccine at local pharmacy- no recent record. She has not done a home covid test. She has received initial covid vaccination and one booster.   She is also having headaches. Headaches intermittent, does not know triggers. Headaches located to right temporal area. Continues to take tylenol 500 mg prn for pain. Tylenol successful at reducing pain. 06/2021 she  reported right eye soreness to provider. She was advised to follow up with eye doctor but has not.   Review of Systems:  Review of Systems  Constitutional:  Positive for chills. Negative for fever, malaise/fatigue and weight loss.  HENT:  Negative for congestion and sore throat.   Respiratory:  Negative for cough, shortness of breath and wheezing.   Cardiovascular:  Negative for chest pain and leg swelling.  Gastrointestinal:  Positive for nausea. Negative for abdominal pain, constipation, diarrhea and vomiting.  Genitourinary: Negative.   Musculoskeletal:  Negative for falls and myalgias.  Skin: Negative.   Neurological:  Positive for weakness and headaches. Negative for dizziness.  Psychiatric/Behavioral:  Negative for depression and memory loss. The patient is not nervous/anxious and does not have insomnia.    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   right upper inner- DCIS, 12-16-2013  s/p right breast lumpectomy with two re-excision's,  no chemo/ radiation and completed anti-estrogen therapy 06/ 2020;  oncologist-- dr [name]--  released pt per lov note in epic 05-01-2020   Endometrial polyp    Essential tremor    occasional hand tremor   Full dentures    GERD (gastroesophageal reflux disease)    rarely takes tums   Hiatal hernia    History of 2019 novel coronavirus disease (COVID-19)    07/ 2020  w/ moderate symptoms that resolved ,  and 2021 w/ mild symptoms;  no hospitalzation for with time but has chronic occasional dry cough since   Hypertension    Hypothyroidism, postsurgical 1970   followed by pcp   OAB (overactive bladder)    Osteopenia    PMB (postmenopausal bleeding)    Type 2 diabetes mellitus ([place])    followed by pcp   (06-13-2021  per pt only checks once per  week, fasting sugar-- 101-120)   Wears hearing aid in both ears    per pt only aid working is right side, but does not wear all the time   Past Surgical History:  Procedure Laterality Date   BREAST BIOPSY Right 10/26/2013   BREAST LUMPECTOMY WITH NEEDLE LOCALIZATION Right 12/16/2013   Procedure: BREAST LUMPECTOMY WITH NEEDLE LOCALIZATION;  Surgeon: Edward Jolly, MD;  Location: [facility];  Service: General;  Laterality: Right;   CATARACT EXTRACTION W/ INTRAOCULAR LENS IMPLANT Bilateral 2019   [place] N/A 06/18/2021   Procedure: [place] RESECTION FOR POLYPS;  Surgeon: [name], MD;  Location: [facility];  Service: Gynecology;  Laterality: N/A;   DILATION AND CURETTAGE OF UTERUS  09/2000   @[facility]  by dr [name]   FINGER SURGERY     right hand-   RE-EXCISION OF BREAST LUMPECTOMY Right 01/30/2014   Procedure: RE-EXCISION OF BREAST LUMPECTOMY;  Surgeon: Edward Jolly, MD;  Location: [facility];  Service: General;  Laterality: Right;   RE-EXCISION OF BREAST LUMPECTOMY Right 03/06/2014   Procedure: RE-EXCISION OF BREAST LUMPECTOMY;  Surgeon: Edward Jolly, MD;  Location: [facility];  Service: General;  Laterality: Right;   THYROID LOBECTOMY  1970   unilateral   TOTAL SHOULDER ARTHROPLASTY Left 03/04/2011   @[facility]    TOTAL SHOULDER ARTHROPLASTY Right 05/21/2016   Procedure: RIGHT TOTAL SHOULDER ARTHROPLASTY;  Surgeon: Ninetta Lights, MD;  Location: [facility];  Service: Orthopedics;  Laterality: Right;   TUBAL LIGATION     yrs ago   Social History:   reports that she has never smoked. She has never used smokeless tobacco. She reports that she does not drink alcohol and does not use drugs.  Family History  Problem Relation Age of Onset   Diabetes Mother    Arthritis Father     Seizures Sister    Colon cancer Sister    Pneumonia Daughter    Cancer Sister        breast   Breast cancer Sister    Multiple sclerosis Son        Deceased     Medications: Patient's Medications  New Prescriptions   No medications on file  Previous Medications   ACETAMINOPHEN (TYLENOL) 500 MG TABLET    Take 500 mg by mouth as needed.   ALLOPURINOL (ZYLOPRIM) 100 MG TABLET    TAKE 1 TABLET BY MOUTH EVERY DAY FOR GOUT   AMLODIPINE (NORVASC) 10 MG TABLET    Take 1 tablet (10 mg total) by mouth daily.   ATORVASTATIN (LIPITOR) 10 MG TABLET    TAKE 1 TABLET(10 MG) BY MOUTH DAILY   CALCIUM-VITAMIN D PO    Take 1 tablet by mouth daily.   DICLOFENAC SODIUM (VOLTAREN) 1 % GEL    Apply 4 g topically 4 (four) times daily as needed.   DULOXETINE (CYMBALTA) 20 MG  CAPSULE    TAKE 1 CAPSULE(20 MG) BY MOUTH DAILY   GLIPIZIDE (GLUCOTROL XL) 5 MG 24 HR TABLET    TAKE 1 TABLET BY MOUTH EVERY DAY WITH BREAKFAST   IBUPROFEN (ADVIL) 400 MG TABLET    Take 0.5 tablets (200 mg total) by mouth every 6 (six) hours as needed.   LEVOTHYROXINE (SYNTHROID) 100 MCG TABLET    TAKE 1 TABLET BY MOUTH EVERY DAY 30 MINUTES BEFORE BREAKFAST ON AN EMPTY STOMACH   LOSARTAN (COZAAR) 50 MG TABLET    TAKE 1 TABLET(50 MG) BY MOUTH DAILY   TRIAMCINOLONE CREAM (KENALOG) 0.1 %    APPLY EXTERNALLY TO THE AFFECTED AREA TWICE DAILY  Modified Medications   No medications on file  Discontinued Medications   No medications on file    Physical Exam:  There were no vitals filed for this visit. There is no height or weight on file to calculate BMI. Wt Readings from Last 3 Encounters:  07/03/21 263 lb (119.3 kg)  06/28/21 260 lb (117.9 kg)  06/18/21 257 lb 4.8 oz (116.7 kg)    Physical Exam Vitals reviewed.  Constitutional:      General: She is not in acute distress.    Appearance: She is obese. She is not ill-appearing.  HENT:     Head: Normocephalic.     Right Ear: There is no impacted cerumen.     Left Ear: There is no  impacted cerumen.     Nose: Nose normal. No congestion.     Mouth/Throat:     Mouth: Mucous membranes are moist.     Pharynx: No posterior oropharyngeal erythema.  Eyes:     General:        Right eye: No discharge.        Left eye: No discharge.  Neck:     Vascular: No carotid bruit.  Cardiovascular:     Rate and Rhythm: Normal rate and regular rhythm.     Pulses: Normal pulses.     Heart sounds: Normal heart sounds.  Pulmonary:     Effort: Pulmonary effort is normal. No respiratory distress.     Breath sounds: Normal breath sounds. No wheezing.  Abdominal:     General: Bowel sounds are normal. There is no distension.     Palpations: Abdomen is soft.     Tenderness: There is no abdominal tenderness.  Musculoskeletal:     Cervical back: Normal range of motion.     Right lower leg: No edema.     Left lower leg: No edema.  Lymphadenopathy:     Cervical: No cervical adenopathy.  Skin:    General: Skin is warm and dry.     Capillary Refill: Capillary refill takes less than 2 seconds.  Neurological:     General: No focal deficit present.     Mental Status: She is alert and oriented to person, place, and time.     Motor: Weakness present.     Gait: Gait abnormal.     Comments: cane  Psychiatric:        Mood and Affect: Mood normal.        Behavior: Behavior normal.    Labs reviewed: Basic Metabolic Panel: Recent Labs    10/12/20 1140 02/15/21 1154 06/18/21 1023  NA 141 140 140  K 4.4 4.3 3.6  CL 107 107 107  CO2 25 26 25   GLUCOSE 104* 129* 121*  BUN 22 15 13   CREATININE 1.29* 1.23* 0.95  CALCIUM 9.8  10.1 10.1  TSH  --  1.21  --    Liver Function Tests: Recent Labs    10/12/20 1140 02/15/21 1154  AST 15 19  ALT 13 15  BILITOT 0.3 0.4  PROT 7.0 7.3   No results for input(s): LIPASE, AMYLASE in the last 8760 hours. No results for input(s): AMMONIA in the last 8760 hours. CBC: Recent Labs    02/15/21 1154 04/29/21 0000 06/18/21 1023 06/28/21 1125   WBC 6.0 6.6 5.3 6.2  NEUTROABS 3,570 3,769  --  3,553  HGB 13.3 12.9 12.0 12.4  HCT 40.8 40.5 35.4* 39.0  MCV 89.5 89.6 86.1 91.5  PLT 205 222 205 222   Lipid Panel: Recent Labs    10/12/20 1140 06/28/21 1125  CHOL 182 124  HDL 55 60  LDLCALC 105* 47  TRIG 119 91  CHOLHDL 3.3 2.1   TSH: Recent Labs    02/15/21 1154  TSH 1.21   A1C: Lab Results  Component Value Date   HGBA1C 6.2 (H) 06/28/2021     Assessment/Plan 1. Chills - reports having flu shot recently - afebrile today, no other cold symptoms - exam unremarkable - SARS-COV-2 RNA,(COVID-19) QUAL NAAT- pending - POC Influenza A/B- both negative  2. Nausea - one episode - reports eating and drinking well today - SARS-COV-2 RNA,(COVID-19) QUAL NAAT - POC Influenza A/B  3. Nonintractable headache, unspecified chronicity pattern, unspecified headache type - intermittent headaches to right temporal area - c/o right eye soreness a few months ago - resolved with tylenol - advised to see eye doctor- plans to schedule - ? Trigeminal neuralgia in future if headaches persist - advised to contact PCP if headaches worsen  4. Weakness - suspect to feeling unwell - no recent falls - ambulating well with cane today  Total time: 22 minutes. Greater than 50% of total time spent doing patient education on symptoms and medication management.    Next appt: none Vannie Hilgert [place], [facility] [phone]

## 2021-09-30 NOTE — Patient Instructions (Signed)
Try to covid test yourself at home   Vitamin C 1000 mg, twice daily x 7 days  Zinc 50 mg daily x 7 days

## 2021-10-01 LAB — SARS-COV-2 RNA,(COVID-19) QUALITATIVE NAAT: SARS CoV2 RNA: NOT DETECTED

## 2021-10-07 ENCOUNTER — Other Ambulatory Visit: Payer: Self-pay | Admitting: Nurse Practitioner

## 2021-10-07 ENCOUNTER — Ambulatory Visit: Payer: Medicare Other | Admitting: Nurse Practitioner

## 2021-10-07 DIAGNOSIS — E785 Hyperlipidemia, unspecified: Secondary | ICD-10-CM

## 2021-10-09 ENCOUNTER — Telehealth: Payer: Self-pay | Admitting: Nurse Practitioner

## 2021-10-09 NOTE — Telephone Encounter (Signed)
Per Jessicas advise, pt called Dr Patrici Ranks office to make appt & was told that no appt available til March 2023.  Both eyes are sore & itch, more the left right. Pain in temples off & on- 2-3 times a day   Please advise  Karla Willis

## 2021-10-10 NOTE — Telephone Encounter (Signed)
Sondra Barges, NP 38 minutes ago (8:48 AM)   LM Booked appt with Amy for tomorrow 12/2.  Thanks, Lattie Haw

## 2021-10-10 NOTE — Telephone Encounter (Signed)
She saw amy recently and she advised her to follow up in office if headaches continued.

## 2021-10-11 ENCOUNTER — Ambulatory Visit (INDEPENDENT_AMBULATORY_CARE_PROVIDER_SITE_OTHER): Payer: Medicare Other | Admitting: Orthopedic Surgery

## 2021-10-11 ENCOUNTER — Other Ambulatory Visit: Payer: Self-pay

## 2021-10-11 ENCOUNTER — Encounter: Payer: Self-pay | Admitting: Orthopedic Surgery

## 2021-10-11 VITALS — BP 130/70 | HR 70 | Temp 97.3°F | Ht 63.0 in | Wt 259.4 lb

## 2021-10-11 DIAGNOSIS — R519 Headache, unspecified: Secondary | ICD-10-CM | POA: Diagnosis not present

## 2021-10-11 DIAGNOSIS — H5713 Ocular pain, bilateral: Secondary | ICD-10-CM

## 2021-10-11 NOTE — Patient Instructions (Signed)
Please see Dr. Katy Fitch 10/14/2021 at 09:15 AM- due to eye pain  May increase tylenol to 1000 mg, twice daily as needed for pain  If eye pain or temple pain becomes severe please report to ED  [place] safety precautions if you feel dizziness

## 2021-10-11 NOTE — Progress Notes (Signed)
Careteam: Patient Care Team: Lauree Chandler, NP as PCP - General (Geriatric Medicine) Nicholas Lose, MD as Consulting Physician (Hematology and Oncology) Specialists, [place] as Consulting Physician (Orthopedic Surgery)  Seen by: Windell Moulding, AGNP-C  PLACE OF SERVICE:  [place]  Advanced Directive information    Allergies  Allergen Reactions   Actos [Pioglitazone] Swelling   Hydrocodone Nausea Only and Other (See Comments)    "bloated"   Codeine Rash   Neurontin [Gabapentin] Palpitations and Other (See Comments)    dizziness   Other Rash    Nuts--PECANS   Peanut-Containing Drug Products Rash    Chief Complaint  Patient presents with   Follow-up    Patient still having pain in temples. Patients eyes are sore and itchy. Pain is not everyday.Patient has sharp pain for a few seconds and then goes away. She has been dealing with this for a while. Patient states lower L eyelid hurts. Eyes very itchy. Eye problems have been going on for a while.Patient has not tried anything for eyes,but has tried tylenol for head. Patient states she gets wobbly when she stands up from bending over.     HPI: Patient is a 86 y.o. female seen today for acute visit due to pain pain in temples.   Reports pain to both eyes, L>R. She has been advised to see her eye doctor, but reports trouble scheduling appointment. Followed by Dr. Katy Fitch. Pain intermittent, described as sharp, rated 8/10, lasts only for a few seconds. Denies changes to vision. History of cataract surgery in past.   Also having pain to temples. Pain intermittent, described as sharp and stabbing, only lasts a few seconds, rated 8/10.    She has been taking tylenol 500 mg prn for pain without success.   No jaw pain.  Review of Systems:  Review of Systems  Constitutional:  Negative for chills, fever, malaise/fatigue and weight loss.  HENT:         Pain to temples  Eyes:  Positive for pain. Negative for  blurred vision, double vision, photophobia, discharge and redness.  Respiratory:  Negative for cough, shortness of breath and wheezing.   Cardiovascular:  Negative for chest pain and leg swelling.  Gastrointestinal: Negative.   Genitourinary: Negative.   Musculoskeletal:  Negative for falls.  Skin: Negative.   Neurological:  Positive for dizziness and headaches. Negative for weakness.  Psychiatric/Behavioral:  Negative for depression. The patient is not nervous/anxious.    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   right upper inner- DCIS, 12-16-2013  s/p right breast lumpectomy with two re-excision's,  no chemo/ radiation and completed anti-estrogen therapy 06/ 2020;  oncologist-- dr [name]--  released pt per lov note in epic 05-01-2020   Endometrial polyp    Essential tremor    occasional hand tremor   Full dentures    GERD (gastroesophageal reflux disease)    rarely takes tums   Hiatal hernia    History of 2019 novel coronavirus disease (COVID-19)    07/ 2020  w/ moderate symptoms that resolved ,  and 2021 w/ mild symptoms;  no hospitalzation for with time but has chronic occasional dry cough since   Hypertension    Hypothyroidism, postsurgical  1970   followed by pcp   OAB (overactive bladder)    Osteopenia    PMB (postmenopausal bleeding)    Type 2 diabetes mellitus ([place])    followed by pcp   (06-13-2021  per pt only checks once per week, fasting sugar-- 101-120)   Wears hearing aid in both ears    per pt only aid working is right side, but does not wear all the time   Past Surgical History:  Procedure Laterality Date   BREAST BIOPSY Right 10/26/2013   BREAST LUMPECTOMY WITH NEEDLE LOCALIZATION Right 12/16/2013   Procedure: BREAST LUMPECTOMY WITH NEEDLE LOCALIZATION;   Surgeon: Edward Jolly, MD;  Location: [facility];  Service: General;  Laterality: Right;   CATARACT EXTRACTION W/ INTRAOCULAR LENS IMPLANT Bilateral 2019   [place] N/A 06/18/2021   Procedure: [place];  Surgeon: [name], MD;  Location: [facility];  Service: Gynecology;  Laterality: N/A;   DILATION AND CURETTAGE OF UTERUS  09/2000   @[facility]  by dr [name]   FINGER SURGERY     right hand-   RE-EXCISION OF BREAST LUMPECTOMY Right 01/30/2014   Procedure: RE-EXCISION OF BREAST LUMPECTOMY;  Surgeon: Edward Jolly, MD;  Location: [facility];  Service: General;  Laterality: Right;   RE-EXCISION OF BREAST LUMPECTOMY Right 03/06/2014   Procedure: RE-EXCISION OF BREAST LUMPECTOMY;  Surgeon: Edward Jolly, MD;  Location: [facility];  Service: General;  Laterality: Right;   THYROID LOBECTOMY  1970   unilateral   TOTAL SHOULDER ARTHROPLASTY Left 03/04/2011   @[facility]    TOTAL SHOULDER ARTHROPLASTY Right 05/21/2016   Procedure: RIGHT TOTAL SHOULDER ARTHROPLASTY;  Surgeon: Ninetta Lights, MD;  Location: [facility];  Service: Orthopedics;  Laterality: Right;   TUBAL LIGATION     yrs ago   Social History:   reports that she has never smoked. She has never used smokeless tobacco. She reports that she does not drink alcohol and does not use drugs.  Family History  Problem Relation Age of Onset   Diabetes Mother    Arthritis Father    Seizures Sister    Colon cancer Sister    Pneumonia Daughter    Cancer Sister        breast   Breast cancer Sister    Multiple sclerosis Son        Deceased     Medications: Patient's Medications  New Prescriptions   No medications on file  Previous Medications   ACETAMINOPHEN (TYLENOL) 500 MG TABLET    Take 500 mg by mouth as needed.    ALLOPURINOL (ZYLOPRIM) 100 MG TABLET    TAKE 1 TABLET BY MOUTH EVERY DAY FOR GOUT   AMLODIPINE (NORVASC) 10 MG TABLET    Take 1 tablet (10 mg total) by mouth daily.   ATORVASTATIN (LIPITOR) 10 MG TABLET    TAKE 1 TABLET(10 MG) BY MOUTH DAILY   CALCIUM-VITAMIN D PO    Take 1 tablet by mouth daily.   DICLOFENAC SODIUM (VOLTAREN) 1 % GEL    Apply 4 g topically 4 (four) times daily as needed.   DULOXETINE (CYMBALTA) 20 MG CAPSULE    TAKE 1 CAPSULE(20 MG) BY MOUTH DAILY   GLIPIZIDE (GLUCOTROL XL) 5 MG 24 HR TABLET    TAKE 1 TABLET BY MOUTH EVERY DAY WITH BREAKFAST  IBUPROFEN (ADVIL) 400 MG TABLET    Take 0.5 tablets (200 mg total) by mouth every 6 (six) hours as needed.   LEVOTHYROXINE (SYNTHROID) 100 MCG TABLET    TAKE 1 TABLET BY MOUTH EVERY DAY 30 MINUTES BEFORE BREAKFAST ON AN EMPTY STOMACH   LOSARTAN (COZAAR) 50 MG TABLET    TAKE 1 TABLET(50 MG) BY MOUTH DAILY   TRIAMCINOLONE CREAM (KENALOG) 0.1 %    APPLY EXTERNALLY TO THE AFFECTED AREA TWICE DAILY  Modified Medications   No medications on file  Discontinued Medications   No medications on file    Physical Exam:  There were no vitals filed for this visit. There is no height or weight on file to calculate BMI. Wt Readings from Last 3 Encounters:  07/03/21 263 lb (119.3 kg)  06/28/21 260 lb (117.9 kg)  06/18/21 257 lb 4.8 oz (116.7 kg)    Physical Exam Vitals reviewed.  Constitutional:      General: She is not in acute distress. HENT:     Head: Normocephalic.     Jaw: There is normal jaw occlusion. No tenderness or swelling.     Comments: Temples non tender to touch    Right Ear: Hearing normal. No swelling or tenderness. There is no impacted cerumen.     Left Ear: Hearing normal. No swelling or tenderness. There is no impacted cerumen.  Eyes:     General: Lids are normal. Vision grossly intact. Gaze aligned appropriately.        Right eye: No discharge.        Left eye: No discharge.     Extraocular Movements: Extraocular  movements intact.     Right eye: Normal extraocular motion and no nystagmus.     Left eye: Normal extraocular motion and no nystagmus.     Conjunctiva/sclera: Conjunctivae normal.     Pupils: Pupils are equal, round, and reactive to light.  Neck:     Vascular: No carotid bruit.  Cardiovascular:     Rate and Rhythm: Normal rate and regular rhythm.     Pulses: Normal pulses.     Heart sounds: Normal heart sounds. No murmur heard. Pulmonary:     Effort: Pulmonary effort is normal. No respiratory distress.     Breath sounds: Normal breath sounds. No wheezing.  Musculoskeletal:     Cervical back: Normal range of motion. No tenderness.     Right lower leg: No edema.     Left lower leg: No edema.  Lymphadenopathy:     Cervical: No cervical adenopathy.  Skin:    General: Skin is warm and dry.     Capillary Refill: Capillary refill takes less than 2 seconds.  Neurological:     General: No focal deficit present.     Mental Status: She is alert and oriented to person, place, and time.  Psychiatric:        Mood and Affect: Mood normal.        Behavior: Behavior normal.    Labs reviewed: Basic Metabolic Panel: Recent Labs    10/12/20 1140 02/15/21 1154 06/18/21 1023  NA 141 140 140  K 4.4 4.3 3.6  CL 107 107 107  CO2 25 26 25   GLUCOSE 104* 129* 121*  BUN 22 15 13   CREATININE 1.29* 1.23* 0.95  CALCIUM 9.8 10.1 10.1  TSH  --  1.21  --    Liver Function Tests: Recent Labs    10/12/20 1140 02/15/21 1154  AST 15 19  ALT  13 15  BILITOT 0.3 0.4  PROT 7.0 7.3   No results for input(s): LIPASE, AMYLASE in the last 8760 hours. No results for input(s): AMMONIA in the last 8760 hours. CBC: Recent Labs    02/15/21 1154 04/29/21 0000 06/18/21 1023 06/28/21 1125  WBC 6.0 6.6 5.3 6.2  NEUTROABS 3,570 3,769  --  3,553  HGB 13.3 12.9 12.0 12.4  HCT 40.8 40.5 35.4* 39.0  MCV 89.5 89.6 86.1 91.5  PLT 205 222 205 222   Lipid Panel: Recent Labs    10/12/20 1140  06/28/21 1125  CHOL 182 124  HDL 55 60  LDLCALC 105* 47  TRIG 119 91  CHOLHDL 3.3 2.1   TSH: Recent Labs    02/15/21 1154  TSH 1.21   A1C: Lab Results  Component Value Date   HGBA1C 6.2 (H) 06/28/2021     Assessment/Plan 1. Nonintractable headache, unspecified chronicity pattern, unspecified headache type - reports temple pain for past 2 months, described as sharp episodes lasting < 10 sec - suspect GCA - Sedimentation Rate - C-reactive Protein - CBC with Differential/Platelet - advised to report to ED if temple pain increases - cont tylenol 1000 mg po bid prn   2. Pain of both eyes - L>R - exam unremarkable - followed by Dr. Katy Fitch- scheduled 12/05 at 09:15AM - advised to reports to ED if eye pain increases  Total time: 25 minutes. Greater than 50% of total time spent doing patient education on symptom/medication management regarding eye pain and headaches.    Next appt: none Eulogia Dismore [place], [facility] [phone]

## 2021-10-12 LAB — CBC WITH DIFFERENTIAL/PLATELET
Absolute Monocytes: 510 cells/uL (ref 200–950)
Basophils Absolute: 42 cells/uL (ref 0–200)
Basophils Relative: 0.8 %
Eosinophils Absolute: 218 cells/uL (ref 15–500)
Eosinophils Relative: 4.2 %
HCT: 38.6 % (ref 35.0–45.0)
Hemoglobin: 12.5 g/dL (ref 11.7–15.5)
Lymphs Abs: 1321 cells/uL (ref 850–3900)
MCH: 29 pg (ref 27.0–33.0)
MCHC: 32.4 g/dL (ref 32.0–36.0)
MCV: 89.6 fL (ref 80.0–100.0)
MPV: 11.7 fL (ref 7.5–12.5)
Monocytes Relative: 9.8 %
Neutro Abs: 3110 cells/uL (ref 1500–7800)
Neutrophils Relative %: 59.8 %
Platelets: 219 10*3/uL (ref 140–400)
RBC: 4.31 10*6/uL (ref 3.80–5.10)
RDW: 13.9 % (ref 11.0–15.0)
Total Lymphocyte: 25.4 %
WBC: 5.2 10*3/uL (ref 3.8–10.8)

## 2021-10-12 LAB — C-REACTIVE PROTEIN: CRP: 6.6 mg/L (ref <8.0)

## 2021-10-12 LAB — SEDIMENTATION RATE: Sed Rate: 36 mm/h — ABNORMAL HIGH (ref 0–30)

## 2021-10-15 ENCOUNTER — Other Ambulatory Visit: Payer: Self-pay | Admitting: Orthopedic Surgery

## 2021-10-15 DIAGNOSIS — M316 Other giant cell arteritis: Secondary | ICD-10-CM

## 2021-10-15 DIAGNOSIS — N182 Chronic kidney disease, stage 2 (mild): Secondary | ICD-10-CM

## 2021-10-15 MED ORDER — METFORMIN HCL 500 MG PO TABS: 500.0000 mg | ORAL_TABLET | Freq: Two times a day (BID) | ORAL | 3 refills | Status: DC

## 2021-10-15 MED ORDER — PREDNISONE 20 MG PO TABS: 40.0000 mg | ORAL_TABLET | Freq: Every day | ORAL | 3 refills | Status: DC

## 2021-10-21 NOTE — Progress Notes (Signed)
[facility] [place]  ASSESSMENT / PLAN: 86 y.o. female with possible temporal arteritis. Plan bilateral temporal artery biopsy in OR as schedule allows.   CHIEF COMPLAINT: headache, temporal tenderness  HISTORY OF PRESENT ILLNESS: Karla Willis is a 86 y.o. female referred to clinic for evaluation of headache.  The patient reports a month-long history of headache, temporal tenderness occasional neck discomfort.  She had an erythrocyte 7 Tatian rate and C-reactive protein level drawn which were both elevated.  This raises concern for temporal arteritis.  The patient reports no visual disturbances.  She does not report classic jaw claudication symptoms.  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   right upper inner- DCIS, 12-16-2013  s/p right breast lumpectomy with two re-excision's,  no chemo/ radiation and completed anti-estrogen therapy 06/ 2020;  oncologist-- dr [name]--  released pt per lov note in epic 05-01-2020   Endometrial polyp    Essential tremor    occasional hand tremor   Full dentures    GERD (gastroesophageal reflux disease)    rarely takes tums   Hiatal hernia    History of 2019 novel coronavirus disease (COVID-19)    07/ 2020  w/ moderate symptoms that resolved ,  and 2021 w/ mild symptoms;  no hospitalzation for with time but has chronic occasional dry cough since   Hypertension    Hypothyroidism, postsurgical 1970   followed by pcp   OAB (overactive bladder)    Osteopenia    PMB (postmenopausal bleeding)    Type 2 diabetes mellitus ([place])    followed by pcp   (06-13-2021  per pt only checks once per week, fasting sugar-- 101-120)   Wears hearing aid in both ears    per pt only aid working is right side, but does not wear all the time     Past Surgical History:  Procedure Laterality Date   BREAST BIOPSY Right 10/26/2013   BREAST LUMPECTOMY WITH NEEDLE LOCALIZATION Right 12/16/2013   Procedure: BREAST LUMPECTOMY WITH NEEDLE LOCALIZATION;  Surgeon: Edward Jolly, MD;  Location: [facility];  Service: General;  Laterality: Right;   CATARACT EXTRACTION W/ INTRAOCULAR LENS IMPLANT Bilateral 2019   CHOLECYSTECTOMY OPEN  1970   COLONOSCOPY     DILATATION & CURETTAGE/HYSTEROSCOPY WITH MYOSURE N/A 06/18/2021   Procedure: DILATATION & CURETTAGE/HYSTEROSCOPY WITH MYOSURE RESECTION FOR POLYPS;  Surgeon: [name], MD;  Location: [facility];  Service: Gynecology;  Laterality: N/A;   DILATION AND CURETTAGE OF UTERUS  09/2000   @[facility]  by dr [name]   FINGER SURGERY     right hand-   RE-EXCISION OF BREAST LUMPECTOMY Right 01/30/2014   Procedure: RE-EXCISION OF BREAST LUMPECTOMY;  Surgeon: Edward Jolly, MD;  Location: [facility];  Service: General;  Laterality: Right;   RE-EXCISION OF BREAST LUMPECTOMY Right 03/06/2014   Procedure: RE-EXCISION OF BREAST LUMPECTOMY;  Surgeon: Edward Jolly, MD;  Location: [facility];  Service: General;  Laterality: Right;   THYROID LOBECTOMY  1970   unilateral   TOTAL SHOULDER ARTHROPLASTY Left 03/04/2011   @[facility]    TOTAL SHOULDER ARTHROPLASTY Right 05/21/2016   Procedure: RIGHT TOTAL SHOULDER ARTHROPLASTY;  Surgeon: Nestor Ramp  Percell Miller, MD;  Location: [facility];  Service: Orthopedics;  Laterality: Right;   TUBAL LIGATION     yrs ago    Family History  Problem Relation Age of Onset   Diabetes Mother    Arthritis Father    Seizures Sister    Colon cancer Sister    Pneumonia Daughter    Cancer Sister        breast   Breast cancer Sister    Multiple sclerosis Son        Deceased     Social History   Socioeconomic History   Marital status: Widowed    Spouse name: Not on file   Number of children: Not on file    Years of education: Not on file   Highest education level: Not on file  Occupational History   Not on file  Tobacco Use   Smoking status: Never   Smokeless tobacco: Never  Vaping Use   Vaping Use: Never used  Substance and Sexual Activity   Alcohol use: No    Alcohol/week: 0.0 standard drinks   Drug use: Never   Sexual activity: Not Currently    Comment: menarche age 74,1st intercourse 86 yo-Fewer than 5 partners  Other Topics Concern   Not on file  Social History Narrative   Diet: No      Do you drink/ eat things with caffeine? Yes      Marital status:    Widowed                           What year were you married ?  1956      Do you live in a house, apartment,assistred living, condo, trailer, etc.)?  apartment      Is it one or more stories?  one      How many persons live in your home ?  3      Do you have any pets in your home ?(please list)  No      Current or past profession:  Bank, Caregiver      Do you exercise?  No                            Type & how often:        Do you have a living will?  Yes      Do you have a DNR form?  Yes                     If not, do you want to discuss one?       Do you have signed POA?HPOA forms?                 If so, please bring to your        appointment      Social Determinants of Health   Financial Resource Strain: Not on file  Food Insecurity: Not on file  Transportation Needs: Not on file  Physical Activity: Not on file  Stress: Not on file  Social Connections: Not on file  Intimate Partner Violence: Not on file    Allergies  Allergen Reactions   Actos [Pioglitazone] Swelling   Hydrocodone Nausea Only and Other (See Comments)    "bloated"   Codeine Rash   Neurontin [Gabapentin] Palpitations and Other (See Comments)    dizziness   Other Rash  Nuts--PECANS   Peanut-Containing Drug Products Rash    Current Outpatient Medications  Medication Sig Dispense Refill   acetaminophen (TYLENOL) 500 MG  tablet Take 1,000 mg by mouth 2 (two) times daily as needed.     allopurinol (ZYLOPRIM) 100 MG tablet TAKE 1 TABLET BY MOUTH EVERY DAY FOR GOUT 90 tablet 1   amLODipine (NORVASC) 10 MG tablet Take 1 tablet (10 mg total) by mouth daily. 90 tablet 3   atorvastatin (LIPITOR) 10 MG tablet TAKE 1 TABLET(10 MG) BY MOUTH DAILY 90 tablet 1   CALCIUM-VITAMIN D PO Take 1 tablet by mouth daily.     diclofenac sodium (VOLTAREN) 1 % GEL Apply 4 g topically 4 (four) times daily as needed. 100 g 0   DULoxetine (CYMBALTA) 20 MG capsule TAKE 1 CAPSULE(20 MG) BY MOUTH DAILY 30 capsule 3   glipiZIDE (GLUCOTROL XL) 5 MG 24 hr tablet TAKE 1 TABLET BY MOUTH EVERY DAY WITH BREAKFAST 90 tablet 2   ibuprofen (ADVIL) 400 MG tablet Take 0.5 tablets (200 mg total) by mouth every 6 (six) hours as needed. 30 tablet 0   levothyroxine (SYNTHROID) 100 MCG tablet TAKE 1 TABLET BY MOUTH EVERY DAY 30 MINUTES BEFORE BREAKFAST ON AN EMPTY STOMACH 90 tablet 1   losartan (COZAAR) 50 MG tablet TAKE 1 TABLET(50 MG) BY MOUTH DAILY 90 tablet 1   metFORMIN (GLUCOPHAGE) 500 MG tablet Take 1 tablet (500 mg total) by mouth 2 (two) times daily with a meal. 180 tablet 3   predniSONE (DELTASONE) 20 MG tablet Take 2 tablets (40 mg total) by mouth daily with breakfast. 60 tablet 3   triamcinolone cream (KENALOG) 0.1 % APPLY EXTERNALLY TO THE AFFECTED AREA TWICE DAILY 45 g 3   No current facility-administered medications for this visit.    REVIEW OF SYSTEMS:  [X]  denotes positive finding, [ ]  denotes negative finding Cardiac  Comments:  Chest pain or chest pressure:    Shortness of breath upon exertion:    Short of breath when lying flat:    Irregular heart rhythm:        Vascular    Pain in calf, thigh, or hip brought on by ambulation:    Pain in feet at night that wakes you up from your sleep:     Blood clot in your veins:    Leg swelling:         Pulmonary    Oxygen at home:    Productive cough:     Wheezing:         Neurologic     Sudden weakness in arms or legs:     Sudden numbness in arms or legs:     Sudden onset of difficulty speaking or slurred speech:    Temporary loss of vision in one eye:     Problems with dizziness:         Gastrointestinal    Blood in stool:     Vomited blood:         Genitourinary    Burning when urinating:     Blood in urine:        Psychiatric    Major depression:         Hematologic    Bleeding problems:    Problems with blood clotting too easily:        Skin    Rashes or ulcers:        Constitutional    Fever or chills:      PHYSICAL  EXAM Vitals:   10/22/21 1026  BP: (!) 149/84  Pulse: 78  Resp: 20  Temp: 98.1 F (36.7 C)  SpO2: 97%  Weight: 257 lb (116.6 kg)  Height: 5\' 3"  (1.6 m)    Constitutional: well appearing. no distress. Appears well nourished.  Neurologic: CN intact. no focal findings. no sensory loss. Psychiatric:  Mood and affect symmetric and appropriate. Eyes:  No icterus. No conjunctival pallor. Ears, nose, throat:  mucous membranes moist. Midline trachea.  Cardiac: regular rate and rhythm.  Respiratory:  unlabored. Abdominal:  soft, non-tender, non-distended.  Peripheral vascular: 2+ radial pulses. Palpable temporal pulse bilaterally. Extremity: no edema. no cyanosis. no pallor.  Skin: no gangrene. no ulceration.  Lymphatic: no Stemmer's sign. no palpable lymphadenopathy.  PERTINENT LABORATORY AND RADIOLOGIC DATA  Most recent CBC CBC Latest Ref Rng & Units 10/11/2021 06/28/2021 06/18/2021  WBC 3.8 - 10.8 Thousand/uL 5.2 6.2 5.3  Hemoglobin 11.7 - 15.5 g/dL 12.5 12.4 12.0  Hematocrit 35.0 - 45.0 % 38.6 39.0 35.4(L)  Platelets 140 - 400 Thousand/uL 219 222 205     Most recent CMP CMP Latest Ref Rng & Units 06/18/2021 02/15/2021 10/12/2020  Glucose 70 - 99 mg/dL 121(H) 129(H) 104(H)  BUN 8 - 23 mg/dL 13 15 22   Creatinine 0.44 - 1.00 mg/dL 0.95 1.23(H) 1.29(H)  Sodium 135 - 145 mmol/L 140 140 141  Potassium 3.5 - 5.1 mmol/L 3.6 4.3 4.4   Chloride 98 - 111 mmol/L 107 107 107  CO2 22 - 32 mmol/L 25 26 25   Calcium 8.9 - 10.3 mg/dL 10.1 10.1 9.8  Total Protein 6.1 - 8.1 g/dL - 7.3 7.0  Total Bilirubin 0.2 - 1.2 mg/dL - 0.4 0.3  Alkaline Phos 33 - 130 U/L - - -  AST 10 - 35 U/L - 19 15  ALT 6 - 29 U/L - 15 13    Renal function CrCl cannot be calculated (Patient's most recent lab result is older than the maximum 21 days allowed.).  Hgb A1c MFr Bld (% of total Hgb)  Date Value  06/28/2021 6.2 (H)    LDL Cholesterol (Calc)  Date Value Ref Range Status  06/28/2021 47 mg/dL (calc) Final    Comment:    Reference range: <100 . Desirable range <100 mg/dL for primary prevention;   <70 mg/dL for patients with CHD or diabetic patients  with > or = 2 CHD risk factors. Marland Kitchen LDL-C is now calculated using the Martin-Hopkins  calculation, which is a validated novel method providing  better accuracy than the Friedewald equation in the  estimation of LDL-C.  Cresenciano Genre et al. Annamaria Helling. 9371;696(78): 2061-2068  (http://education.[employer].com/faq/FAQ164)     Yevonne Aline. Stanford Breed, MD [facility] Phone Number: [phone] 10/21/2021 6:32 PM  Total time spent on preparing this encounter including chart review, data review, collecting history, examining the patient, coordinating care for this new patient, 45 minutes.  Portions of this report may have been transcribed using voice recognition software.  Every effort has been made to ensure accuracy; however, inadvertent computerized transcription errors may still be present.

## 2021-10-21 NOTE — H&P (View-Only) (Signed)
[facility] [place]  ASSESSMENT / PLAN: 86 y.o. female with possible temporal arteritis. Plan bilateral temporal artery biopsy in OR as schedule allows.   CHIEF COMPLAINT: headache, temporal tenderness  HISTORY OF PRESENT ILLNESS: Karla Willis is a 86 y.o. female referred to clinic for evaluation of headache.  The patient reports a month-long history of headache, temporal tenderness occasional neck discomfort.  She had an erythrocyte 7 Tatian rate and C-reactive protein level drawn which were both elevated.  This raises concern for temporal arteritis.  The patient reports no visual disturbances.  She does not report classic jaw claudication symptoms.  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   right upper inner- DCIS, 12-16-2013  s/p right breast lumpectomy with two re-excision's,  no chemo/ radiation and completed anti-estrogen therapy 06/ 2020;  oncologist-- dr [name]--  released pt per lov note in epic 05-01-2020   Endometrial polyp    Essential tremor    occasional hand tremor   Full dentures    GERD (gastroesophageal reflux disease)    rarely takes tums   Hiatal hernia    History of 2019 novel coronavirus disease (COVID-19)    07/ 2020  w/ moderate symptoms that resolved ,  and 2021 w/ mild symptoms;  no hospitalzation for with time but has chronic occasional dry cough since   Hypertension    Hypothyroidism, postsurgical 1970   followed by pcp   OAB (overactive bladder)    Osteopenia    PMB (postmenopausal bleeding)    Type 2 diabetes mellitus ([place])    followed by pcp   (06-13-2021  per pt only checks once per week, fasting sugar-- 101-120)   Wears hearing aid in both ears    per pt only aid working is right side, but does not wear all the time     Past Surgical History:  Procedure Laterality Date   BREAST BIOPSY Right 10/26/2013   BREAST LUMPECTOMY WITH NEEDLE LOCALIZATION Right 12/16/2013   Procedure: BREAST LUMPECTOMY WITH NEEDLE LOCALIZATION;  Surgeon: Edward Jolly, MD;  Location: [facility];  Service: General;  Laterality: Right;   CATARACT EXTRACTION W/ INTRAOCULAR LENS IMPLANT Bilateral 2019   CHOLECYSTECTOMY OPEN  1970   COLONOSCOPY     DILATATION & CURETTAGE/HYSTEROSCOPY WITH MYOSURE N/A 06/18/2021   Procedure: DILATATION & CURETTAGE/HYSTEROSCOPY WITH MYOSURE RESECTION FOR POLYPS;  Surgeon: [name], MD;  Location: [facility];  Service: Gynecology;  Laterality: N/A;   DILATION AND CURETTAGE OF UTERUS  09/2000   @[facility]  by dr [name]   FINGER SURGERY     right hand-   RE-EXCISION OF BREAST LUMPECTOMY Right 01/30/2014   Procedure: RE-EXCISION OF BREAST LUMPECTOMY;  Surgeon: Edward Jolly, MD;  Location: [facility];  Service: General;  Laterality: Right;   RE-EXCISION OF BREAST LUMPECTOMY Right 03/06/2014   Procedure: RE-EXCISION OF BREAST LUMPECTOMY;  Surgeon: Edward Jolly, MD;  Location: [facility];  Service: General;  Laterality: Right;   THYROID LOBECTOMY  1970   unilateral   TOTAL SHOULDER ARTHROPLASTY Left 03/04/2011   @[facility]    TOTAL SHOULDER ARTHROPLASTY Right 05/21/2016   Procedure: RIGHT TOTAL SHOULDER ARTHROPLASTY;  Surgeon: Nestor Ramp  Percell Miller, MD;  Location: [facility];  Service: Orthopedics;  Laterality: Right;   TUBAL LIGATION     yrs ago    Family History  Problem Relation Age of Onset   Diabetes Mother    Arthritis Father    Seizures Sister    Colon cancer Sister    Pneumonia Daughter    Cancer Sister        breast   Breast cancer Sister    Multiple sclerosis Son        Deceased     Social History   Socioeconomic History   Marital status: Widowed    Spouse name: Not on file   Number of children: Not on file    Years of education: Not on file   Highest education level: Not on file  Occupational History   Not on file  Tobacco Use   Smoking status: Never   Smokeless tobacco: Never  Vaping Use   Vaping Use: Never used  Substance and Sexual Activity   Alcohol use: No    Alcohol/week: 0.0 standard drinks   Drug use: Never   Sexual activity: Not Currently    Comment: menarche age 57,1st intercourse 86 yo-Fewer than 5 partners  Other Topics Concern   Not on file  Social History Narrative   Diet: No      Do you drink/ eat things with caffeine? Yes      Marital status:    Widowed                           What year were you married ?  1956      Do you live in a house, apartment,assistred living, condo, trailer, etc.)?  apartment      Is it one or more stories?  one      How many persons live in your home ?  3      Do you have any pets in your home ?(please list)  No      Current or past profession:  Bank, Caregiver      Do you exercise?  No                            Type & how often:        Do you have a living will?  Yes      Do you have a DNR form?  Yes                     If not, do you want to discuss one?       Do you have signed POA?HPOA forms?                 If so, please bring to your        appointment      Social Determinants of Health   Financial Resource Strain: Not on file  Food Insecurity: Not on file  Transportation Needs: Not on file  Physical Activity: Not on file  Stress: Not on file  Social Connections: Not on file  Intimate Partner Violence: Not on file    Allergies  Allergen Reactions   Actos [Pioglitazone] Swelling   Hydrocodone Nausea Only and Other (See Comments)    "bloated"   Codeine Rash   Neurontin [Gabapentin] Palpitations and Other (See Comments)    dizziness   Other Rash  Nuts--PECANS   Peanut-Containing Drug Products Rash    Current Outpatient Medications  Medication Sig Dispense Refill   acetaminophen (TYLENOL) 500 MG  tablet Take 1,000 mg by mouth 2 (two) times daily as needed.     allopurinol (ZYLOPRIM) 100 MG tablet TAKE 1 TABLET BY MOUTH EVERY DAY FOR GOUT 90 tablet 1   amLODipine (NORVASC) 10 MG tablet Take 1 tablet (10 mg total) by mouth daily. 90 tablet 3   atorvastatin (LIPITOR) 10 MG tablet TAKE 1 TABLET(10 MG) BY MOUTH DAILY 90 tablet 1   CALCIUM-VITAMIN D PO Take 1 tablet by mouth daily.     diclofenac sodium (VOLTAREN) 1 % GEL Apply 4 g topically 4 (four) times daily as needed. 100 g 0   DULoxetine (CYMBALTA) 20 MG capsule TAKE 1 CAPSULE(20 MG) BY MOUTH DAILY 30 capsule 3   glipiZIDE (GLUCOTROL XL) 5 MG 24 hr tablet TAKE 1 TABLET BY MOUTH EVERY DAY WITH BREAKFAST 90 tablet 2   ibuprofen (ADVIL) 400 MG tablet Take 0.5 tablets (200 mg total) by mouth every 6 (six) hours as needed. 30 tablet 0   levothyroxine (SYNTHROID) 100 MCG tablet TAKE 1 TABLET BY MOUTH EVERY DAY 30 MINUTES BEFORE BREAKFAST ON AN EMPTY STOMACH 90 tablet 1   losartan (COZAAR) 50 MG tablet TAKE 1 TABLET(50 MG) BY MOUTH DAILY 90 tablet 1   metFORMIN (GLUCOPHAGE) 500 MG tablet Take 1 tablet (500 mg total) by mouth 2 (two) times daily with a meal. 180 tablet 3   predniSONE (DELTASONE) 20 MG tablet Take 2 tablets (40 mg total) by mouth daily with breakfast. 60 tablet 3   triamcinolone cream (KENALOG) 0.1 % APPLY EXTERNALLY TO THE AFFECTED AREA TWICE DAILY 45 g 3   No current facility-administered medications for this visit.    REVIEW OF SYSTEMS:  [X]  denotes positive finding, [ ]  denotes negative finding Cardiac  Comments:  Chest pain or chest pressure:    Shortness of breath upon exertion:    Short of breath when lying flat:    Irregular heart rhythm:        Vascular    Pain in calf, thigh, or hip brought on by ambulation:    Pain in feet at night that wakes you up from your sleep:     Blood clot in your veins:    Leg swelling:         Pulmonary    Oxygen at home:    Productive cough:     Wheezing:         Neurologic     Sudden weakness in arms or legs:     Sudden numbness in arms or legs:     Sudden onset of difficulty speaking or slurred speech:    Temporary loss of vision in one eye:     Problems with dizziness:         Gastrointestinal    Blood in stool:     Vomited blood:         Genitourinary    Burning when urinating:     Blood in urine:        Psychiatric    Major depression:         Hematologic    Bleeding problems:    Problems with blood clotting too easily:        Skin    Rashes or ulcers:        Constitutional    Fever or chills:      PHYSICAL  EXAM Vitals:   10/22/21 1026  BP: (!) 149/84  Pulse: 78  Resp: 20  Temp: 98.1 F (36.7 C)  SpO2: 97%  Weight: 257 lb (116.6 kg)  Height: 5\' 3"  (1.6 m)    Constitutional: well appearing. no distress. Appears well nourished.  Neurologic: CN intact. no focal findings. no sensory loss. Psychiatric:  Mood and affect symmetric and appropriate. Eyes:  No icterus. No conjunctival pallor. Ears, nose, throat:  mucous membranes moist. Midline trachea.  Cardiac: regular rate and rhythm.  Respiratory:  unlabored. Abdominal:  soft, non-tender, non-distended.  Peripheral vascular: 2+ radial pulses. Palpable temporal pulse bilaterally. Extremity: no edema. no cyanosis. no pallor.  Skin: no gangrene. no ulceration.  Lymphatic: no Stemmer's sign. no palpable lymphadenopathy.  PERTINENT LABORATORY AND RADIOLOGIC DATA  Most recent CBC CBC Latest Ref Rng & Units 10/11/2021 06/28/2021 06/18/2021  WBC 3.8 - 10.8 Thousand/uL 5.2 6.2 5.3  Hemoglobin 11.7 - 15.5 g/dL 12.5 12.4 12.0  Hematocrit 35.0 - 45.0 % 38.6 39.0 35.4(L)  Platelets 140 - 400 Thousand/uL 219 222 205     Most recent CMP CMP Latest Ref Rng & Units 06/18/2021 02/15/2021 10/12/2020  Glucose 70 - 99 mg/dL 121(H) 129(H) 104(H)  BUN 8 - 23 mg/dL 13 15 22   Creatinine 0.44 - 1.00 mg/dL 0.95 1.23(H) 1.29(H)  Sodium 135 - 145 mmol/L 140 140 141  Potassium 3.5 - 5.1 mmol/L 3.6 4.3 4.4   Chloride 98 - 111 mmol/L 107 107 107  CO2 22 - 32 mmol/L 25 26 25   Calcium 8.9 - 10.3 mg/dL 10.1 10.1 9.8  Total Protein 6.1 - 8.1 g/dL - 7.3 7.0  Total Bilirubin 0.2 - 1.2 mg/dL - 0.4 0.3  Alkaline Phos 33 - 130 U/L - - -  AST 10 - 35 U/L - 19 15  ALT 6 - 29 U/L - 15 13    Renal function CrCl cannot be calculated (Patient's most recent lab result is older than the maximum 21 days allowed.).  Hgb A1c MFr Bld (% of total Hgb)  Date Value  06/28/2021 6.2 (H)    LDL Cholesterol (Calc)  Date Value Ref Range Status  06/28/2021 47 mg/dL (calc) Final    Comment:    Reference range: <100 . Desirable range <100 mg/dL for primary prevention;   <70 mg/dL for patients with CHD or diabetic patients  with > or = 2 CHD risk factors. Marland Kitchen LDL-C is now calculated using the Martin-Hopkins  calculation, which is a validated novel method providing  better accuracy than the Friedewald equation in the  estimation of LDL-C.  Cresenciano Genre et al. Annamaria Helling. 3846;659(93): 2061-2068  (http://education.[facility].com/faq/FAQ164)     Yevonne Aline. Stanford Breed, MD [facility] Phone Number: [phone] 10/21/2021 6:32 PM  Total time spent on preparing this encounter including chart review, data review, collecting history, examining the patient, coordinating care for this new patient, 45 minutes.  Portions of this report may have been transcribed using voice recognition software.  Every effort has been made to ensure accuracy; however, inadvertent computerized transcription errors may still be present.

## 2021-10-22 ENCOUNTER — Ambulatory Visit: Payer: Medicare Other | Admitting: Vascular Surgery

## 2021-10-22 ENCOUNTER — Other Ambulatory Visit: Payer: Self-pay

## 2021-10-22 ENCOUNTER — Encounter: Payer: Self-pay | Admitting: Vascular Surgery

## 2021-10-22 VITALS — BP 149/84 | HR 78 | Temp 98.1°F | Resp 20 | Ht 63.0 in | Wt 257.0 lb

## 2021-10-22 DIAGNOSIS — R519 Headache, unspecified: Secondary | ICD-10-CM | POA: Diagnosis not present

## 2021-10-23 ENCOUNTER — Other Ambulatory Visit: Payer: Self-pay

## 2021-10-30 NOTE — Progress Notes (Signed)
Surgical Instructions    Your procedure is scheduled on 11/07/21.  Report to [facility] Main Entrance "A" at 7:45 A.M., then check in with the Admitting office.  Call this number if you have problems the morning of surgery:  [phone]   If you have any questions prior to your surgery date call [phone]: Open Monday-Friday 8am-4pm    Remember:  Do not eat or drink after midnight the night before your surgery    Take these medicines the morning of surgery with A SIP OF WATER  allopurinol (ZYLOPRIM) amLODipine (NORVASC) DULoxetine (CYMBALTA) levothyroxine (SYNTHROID)  predniSONE (DELTASONE)  IF NEEDED: acetaminophen (TYLENOL)  As of today, STOP taking any Aspirin (unless otherwise instructed by your surgeon) Aleve, Naproxen, Ibuprofen, Motrin, Advil, Goody's, BC's, all herbal medications, fish oil, diclofenac sodium (VOLTAREN) and all vitamins.  WHAT DO I DO ABOUT MY DIABETES MEDICATION?   Do not take oral diabetes medicines (pills) the morning of surgery.  THE MORNING OF SURGERY, do not take metFORMIN (GLUCOPHAGE) or glipiZIDE (GLUCOTROL XL).  The day of surgery, do not take other diabetes injectables, including Byetta (exenatide), Bydureon (exenatide ER), Victoza (liraglutide), or Trulicity (dulaglutide).  If your CBG is greater than 220 mg/dL, you may take  of your sliding scale (correction) dose of insulin.   HOW TO MANAGE YOUR DIABETES BEFORE AND AFTER SURGERY  Why is it important to control my blood sugar before and after surgery? Improving blood sugar levels before and after surgery helps healing and can limit problems. A way of improving blood sugar control is eating a healthy diet by:  Eating less sugar and carbohydrates  Increasing activity/exercise  Talking with your doctor about reaching your blood sugar goals High blood sugars (greater than 180 mg/dL) can raise your risk of infections and slow your recovery, so you will need to focus on controlling  your diabetes during the weeks before surgery. Make sure that the doctor who takes care of your diabetes knows about your planned surgery including the date and location.  How do I manage my blood sugar before surgery? Check your blood sugar at least 4 times a day, starting 2 days before surgery, to make sure that the level is not too high or low.  Check your blood sugar the morning of your surgery when you wake up and every 2 hours until you get to the Short Stay unit.  If your blood sugar is less than 70 mg/dL, you will need to treat for low blood sugar: Do not take insulin. Treat a low blood sugar (less than 70 mg/dL) with  cup of clear juice (cranberry or apple), 4 glucose tablets, OR glucose gel. Recheck blood sugar in 15 minutes after treatment (to make sure it is greater than 70 mg/dL). If your blood sugar is not greater than 70 mg/dL on recheck, call [phone] for further instructions. Report your blood sugar to the short stay nurse when you get to Short Stay.  If you are admitted to the hospital after surgery: Your blood sugar will be checked by the staff and you will probably be given insulin after surgery (instead of oral diabetes medicines) to make sure you have good blood sugar levels. The goal for blood sugar control after surgery is 80-180 mg/dL.    After your COVID test   You are not required to quarantine however you are required to wear a well-fitting mask when you are out and around people not in your household.  If your mask becomes wet or  soiled, replace with a new one.  Wash your hands often with soap and water for 20 seconds or clean your hands with an alcohol-based hand sanitizer that contains at least 60% alcohol.  Do not share personal items.  Notify your provider: if you are in close contact with someone who has COVID  or if you develop a fever of 100.4 or greater, sneezing, cough, sore throat, shortness of breath or body aches.             Do not  wear jewelry or makeup Do not wear lotions, powders, perfumes/colognes, or deodorant. Do not shave 48 hours prior to surgery.  Men may shave face and neck. Do not bring valuables to the hospital. DO Not wear nail polish, gel polish, artificial nails, or any other type of covering on natural nails including finger and toenails. If patients have artificial nails, gel coating, etc. that need to be removed by a nail salon, please have this removed prior to surgery or surgery may need to be canceled/delayed if the surgeon/ anesthesia feels like the patient is unable to be adequately monitored.             [facility] is not responsible for any belongings or valuables.  Do NOT Smoke (Tobacco/Vaping)  24 hours prior to your procedure  If you use a CPAP at night, you may bring your mask for your overnight stay.   Contacts, glasses, hearing aids, dentures or partials may not be worn into surgery, please bring cases for these belongings   For patients admitted to the hospital, discharge time will be determined by your treatment team.   Patients discharged the day of surgery will not be allowed to drive home, and someone needs to stay with them for 24 hours.  NO VISITORS WILL BE ALLOWED IN PRE-OP WHERE PATIENTS ARE PREPPED FOR SURGERY.  ONLY 1 SUPPORT PERSON MAY BE PRESENT IN THE WAITING ROOM WHILE YOU ARE IN SURGERY.  IF YOU ARE TO BE ADMITTED, ONCE YOU ARE IN YOUR ROOM YOU WILL BE ALLOWED TWO (2) VISITORS. 1 (ONE) VISITOR MAY STAY OVERNIGHT BUT MUST ARRIVE TO THE ROOM BY 8pm.  Minor children may have two parents present. Special consideration for safety and communication needs will be reviewed on a case by case basis.  Special instructions:    Oral Hygiene is also important to reduce your risk of infection.  Remember - BRUSH YOUR TEETH THE MORNING OF SURGERY WITH YOUR REGULAR TOOTHPASTE   [place]- Preparing For Surgery  Before surgery, you can play an important role. Because skin is not  sterile, your skin needs to be as free of germs as possible. You can reduce the number of germs on your skin by washing with CHG (chlorahexidine gluconate) Soap before surgery.  CHG is an antiseptic cleaner which kills germs and bonds with the skin to continue killing germs even after washing.     Please do not use if you have an allergy to CHG or antibacterial soaps. If your skin becomes reddened/irritated stop using the CHG.  Do not shave (including legs and underarms) for at least 48 hours prior to first CHG shower. It is OK to shave your face.  Please follow these instructions carefully.     Shower the NIGHT BEFORE SURGERY and the MORNING OF SURGERY with CHG Soap.   If you chose to wash your hair, wash your hair first as usual with your normal shampoo. After you shampoo, rinse your hair and body  thoroughly to remove the shampoo.  Then [employer] and genitals (private parts) with your normal soap and rinse thoroughly to remove soap.  After that Use CHG Soap as you would any other liquid soap. You can apply CHG directly to the skin and wash gently with a scrungie or a clean washcloth.   Apply the CHG Soap to your body ONLY FROM THE NECK DOWN.  Do not use on open wounds or open sores. Avoid contact with your eyes, ears, mouth and genitals (private parts). Wash Face and genitals (private parts)  with your normal soap.   Wash thoroughly, paying special attention to the area where your surgery will be performed.  Thoroughly rinse your body with warm water from the neck down.  DO NOT shower/wash with your normal soap after using and rinsing off the CHG Soap.  Pat yourself dry with a CLEAN TOWEL.  Wear CLEAN PAJAMAS to bed the night before surgery  Place CLEAN SHEETS on your bed the night before your surgery  DO NOT SLEEP WITH PETS.   Day of Surgery: Take a shower with CHG soap. Wear Clean/Comfortable clothing the morning of surgery Do not apply any deodorants/lotions.   Remember to  brush your teeth WITH YOUR REGULAR TOOTHPASTE.   Please read over the following fact sheets that you were given.

## 2021-10-31 ENCOUNTER — Encounter (HOSPITAL_COMMUNITY): Payer: Self-pay

## 2021-10-31 ENCOUNTER — Other Ambulatory Visit: Payer: Self-pay

## 2021-10-31 ENCOUNTER — Encounter (HOSPITAL_COMMUNITY)
Admission: RE | Admit: 2021-10-31 | Discharge: 2021-10-31 | Disposition: A | Discharge status: Home / Self Care | Payer: Medicare Other | Source: Ambulatory Visit | Attending: Vascular Surgery | Admitting: Vascular Surgery

## 2021-10-31 ENCOUNTER — Ambulatory Visit (INDEPENDENT_AMBULATORY_CARE_PROVIDER_SITE_OTHER): Payer: Medicare Other | Admitting: Orthopedic Surgery

## 2021-10-31 ENCOUNTER — Encounter: Payer: Self-pay | Admitting: Orthopedic Surgery

## 2021-10-31 VITALS — BP 120/50 | HR 98 | Temp 97.1°F | Ht 63.0 in | Wt 257.0 lb

## 2021-10-31 VITALS — BP 140/80 | HR 89 | Temp 99.0°F | Resp 18 | Ht 63.0 in | Wt 257.8 lb

## 2021-10-31 DIAGNOSIS — E1122 Type 2 diabetes mellitus with diabetic chronic kidney disease: Secondary | ICD-10-CM | POA: Insufficient documentation

## 2021-10-31 DIAGNOSIS — M316 Other giant cell arteritis: Secondary | ICD-10-CM | POA: Diagnosis not present

## 2021-10-31 DIAGNOSIS — N182 Chronic kidney disease, stage 2 (mild): Secondary | ICD-10-CM | POA: Diagnosis not present

## 2021-10-31 DIAGNOSIS — R519 Headache, unspecified: Secondary | ICD-10-CM | POA: Diagnosis not present

## 2021-10-31 DIAGNOSIS — H5713 Ocular pain, bilateral: Secondary | ICD-10-CM | POA: Diagnosis not present

## 2021-10-31 DIAGNOSIS — Z01812 Encounter for preprocedural laboratory examination: Secondary | ICD-10-CM | POA: Insufficient documentation

## 2021-10-31 DIAGNOSIS — Z01818 Encounter for other preprocedural examination: Secondary | ICD-10-CM

## 2021-10-31 HISTORY — DX: Anemia, unspecified: D64.9

## 2021-10-31 LAB — BASIC METABOLIC PANEL
Anion gap: 10 (ref 5–15)
BUN: 24 mg/dL — ABNORMAL HIGH (ref 8–23)
CO2: 25 mmol/L (ref 22–32)
Calcium: 10.5 mg/dL — ABNORMAL HIGH (ref 8.9–10.3)
Chloride: 101 mmol/L (ref 98–111)
Creatinine, Ser: 1.38 mg/dL — ABNORMAL HIGH (ref 0.44–1.00)
GFR, Estimated: 38 mL/min — ABNORMAL LOW (ref >60)
Glucose, Bld: 126 mg/dL — ABNORMAL HIGH (ref 70–99)
Potassium: 4.3 mmol/L (ref 3.5–5.1)
Sodium: 136 mmol/L (ref 135–145)

## 2021-10-31 LAB — GLUCOSE, CAPILLARY: Glucose-Capillary: 126 mg/dL — ABNORMAL HIGH (ref 70–99)

## 2021-10-31 LAB — HEMOGLOBIN A1C
Hgb A1c MFr Bld: 7 % — ABNORMAL HIGH (ref 4.8–5.6)
Mean Plasma Glucose: 154.2 mg/dL

## 2021-10-31 LAB — SURGICAL PCR SCREEN
MRSA, PCR: NEGATIVE
Staphylococcus aureus: POSITIVE — AB

## 2021-10-31 NOTE — Patient Instructions (Addendum)
Continue prednisone and metformin

## 2021-10-31 NOTE — Progress Notes (Signed)
Careteam: Patient Care Team: Lauree Chandler, NP as PCP - General (Geriatric Medicine) Nicholas Lose, MD as Consulting Physician (Hematology and Oncology) [facility], [facility] as Consulting Physician (Orthopedic Surgery)  Seen by: Windell Moulding, AGNP-C  PLACE OF SERVICE:  [place] Directive information Does Patient Have a Medical Advance Directive?: Yes, Type of Advance Directive: [place], Does patient want to make changes to medical advance directive?: No - Patient declined  Allergies  Allergen Reactions   Actos [Pioglitazone] Swelling   Hydrocodone Nausea Only and Other (See Comments)    "bloated"   Codeine Rash   Neurontin [Gabapentin] Palpitations and Other (See Comments)    dizziness   Other Rash    Nuts--PECANS   Peanut-Containing Drug Products Rash    Chief Complaint  Patient presents with   Follow-up    2 week follow up on new medication. Patient states blood pressure keeps rising. Glucose levels are fine.     HPI: Patient is a 86 y.o. female seen today for follow up on headache.   Headaches have improved since starting prednisone. Scheduled to have bilateral temporal biopsy 11/07/2021. Pre op lab work done today.   She is not checking her blood sugars. Denies hypoglycemic events. Continues to take metformin twice daily without complication. A1C 7.0 10/31/2021. Reports limiting carbs and sugars in diet.  No recent falls or injuries.   Review of Systems:  Review of Systems  Constitutional:  Negative for chills, fever, malaise/fatigue and weight loss.  Eyes:  Negative for blurred vision, double vision and pain.  Respiratory:  Negative for cough, shortness of breath and wheezing.   Cardiovascular:  Negative for chest pain and leg swelling.  Neurological:  Positive for headaches. Negative for dizziness and weakness.  Psychiatric/Behavioral:  Negative for depression. The patient is not nervous/anxious.    Past  Medical History:  Diagnosis Date   Anemia    Arthritis    Chronic idiopathic gout    06-13-2021  per pt last flare-up  > 2 yrs ago,  right foot   CKD (chronic kidney disease), stage II    Ductal carcinoma in situ (DCIS) of right breast 10/24/2013   right upper inner- DCIS, 12-16-2013  s/p right breast lumpectomy with two re-excision's,  no chemo/ radiation and completed anti-estrogen therapy 06/ 2020;  oncologist-- dr [name]--  released pt per lov note in epic 05-01-2020   Endometrial polyp    Essential tremor    occasional hand tremor   Full dentures    GERD (gastroesophageal reflux disease)    rarely takes tums   Hiatal hernia    History of 2019 novel coronavirus disease (COVID-19)    07/ 2020  w/ moderate symptoms that resolved ,  and 2021 w/ mild symptoms;  no hospitalzation for with time but has chronic occasional dry cough since   Hypertension    Hypothyroidism, postsurgical 1970   followed by pcp   OAB (overactive bladder)    Osteopenia    PMB (postmenopausal bleeding)    Pneumonia    Type 2 diabetes mellitus ([place])    followed by pcp   (06-13-2021  per pt only checks once per week, fasting sugar-- 101-120)   Wears hearing aid in both ears    per pt only aid working is right side, but does not wear all the time   Past Surgical History:  Procedure Laterality Date   BREAST BIOPSY Right 10/26/2013   BREAST LUMPECTOMY WITH NEEDLE  LOCALIZATION Right 12/16/2013   Procedure: BREAST LUMPECTOMY WITH NEEDLE LOCALIZATION;  Surgeon: Edward Jolly, MD;  Location: [facility];  Service: General;  Laterality: Right;   CATARACT EXTRACTION W/ INTRAOCULAR LENS IMPLANT Bilateral 2019   CHOLECYSTECTOMY OPEN  1970   COLONOSCOPY     DILATATION & CURETTAGE/HYSTEROSCOPY WITH MYOSURE N/A 06/18/2021   Procedure: DILATATION & CURETTAGE/HYSTEROSCOPY WITH MYOSURE RESECTION FOR POLYPS;  Surgeon: [name], MD;  Location: [facility];  Service:  Gynecology;  Laterality: N/A;   DILATION AND CURETTAGE OF UTERUS  09/2000   '@[facility]'  by dr [name]   FINGER SURGERY     right hand-   RE-EXCISION OF BREAST LUMPECTOMY Right 01/30/2014   Procedure: RE-EXCISION OF BREAST LUMPECTOMY;  Surgeon: Edward Jolly, MD;  Location: [facility];  Service: General;  Laterality: Right;   RE-EXCISION OF BREAST LUMPECTOMY Right 03/06/2014   Procedure: RE-EXCISION OF BREAST LUMPECTOMY;  Surgeon: Edward Jolly, MD;  Location: [facility];  Service: General;  Laterality: Right;   THYROID LOBECTOMY  1970   unilateral   TOTAL SHOULDER ARTHROPLASTY Left 03/04/2011   '@[facility]'    TOTAL SHOULDER ARTHROPLASTY Right 05/21/2016   Procedure: RIGHT TOTAL SHOULDER ARTHROPLASTY;  Surgeon: Ninetta Lights, MD;  Location: [place];  Service: Orthopedics;  Laterality: Right;   TUBAL LIGATION     yrs ago   Social History:   reports that she has never smoked. She has never used smokeless tobacco. She reports that she does not drink alcohol and does not use drugs.  Family History  Problem Relation Age of Onset   Diabetes Mother    Arthritis Father    Seizures Sister    Colon cancer Sister    Pneumonia Daughter    Cancer Sister        breast   Breast cancer Sister    Multiple sclerosis Son        Deceased     Medications: Patient's Medications  New Prescriptions   No medications on file  Previous Medications   ACETAMINOPHEN (TYLENOL) 650 MG CR TABLET    Take 1,300 mg by mouth 2 (two) times daily as needed for pain.   ALLOPURINOL (ZYLOPRIM) 100 MG TABLET    TAKE 1 TABLET BY MOUTH EVERY DAY FOR GOUT   AMLODIPINE (NORVASC) 10 MG TABLET    Take 1 tablet (10 mg total) by mouth daily.   ATORVASTATIN (LIPITOR) 10 MG TABLET    TAKE 1 TABLET(10 MG) BY MOUTH DAILY   CALCIUM-VITAMIN D PO    Take 1 tablet by mouth daily.   DICLOFENAC SODIUM (VOLTAREN) 1 % GEL    Apply 4 g topically 4 (four) times daily as needed.   DULOXETINE (CYMBALTA) 20 MG  CAPSULE    TAKE 1 CAPSULE(20 MG) BY MOUTH DAILY   GLIPIZIDE (GLUCOTROL XL) 5 MG 24 HR TABLET    TAKE 1 TABLET BY MOUTH EVERY DAY WITH BREAKFAST   IBUPROFEN (ADVIL) 400 MG TABLET    Take 0.5 tablets (200 mg total) by mouth every 6 (six) hours as needed.   LEVOTHYROXINE (SYNTHROID) 100 MCG TABLET    TAKE 1 TABLET BY MOUTH EVERY DAY 30 MINUTES BEFORE BREAKFAST ON AN EMPTY STOMACH   LOSARTAN (COZAAR) 50 MG TABLET    TAKE 1 TABLET(50 MG) BY MOUTH DAILY   METFORMIN (GLUCOPHAGE) 500 MG TABLET    Take 1 tablet (500 mg total) by mouth 2 (two) times daily with  a meal.   PREDNISONE (DELTASONE) 20 MG TABLET    Take 2 tablets (40 mg total) by mouth daily with breakfast.   TRIAMCINOLONE CREAM (KENALOG) 0.1 %    APPLY EXTERNALLY TO THE AFFECTED AREA TWICE DAILY  Modified Medications   No medications on file  Discontinued Medications   No medications on file    Physical Exam:  There were no vitals filed for this visit. There is no height or weight on file to calculate BMI. Wt Readings from Last 3 Encounters:  10/31/21 257 lb 12.8 oz (116.9 kg)  10/22/21 257 lb (116.6 kg)  10/11/21 259 lb 6.4 oz (117.7 kg)    Physical Exam Vitals reviewed.  Constitutional:      General: She is not in acute distress.    Appearance: She is obese.  HENT:     Head: Normocephalic.  Eyes:     General:        Right eye: No discharge.        Left eye: No discharge.  Cardiovascular:     Rate and Rhythm: Normal rate and regular rhythm.     Pulses: Normal pulses.     Heart sounds: Normal heart sounds. No murmur heard. Pulmonary:     Effort: Pulmonary effort is normal. No respiratory distress.     Breath sounds: Normal breath sounds. No wheezing.  Musculoskeletal:     Right lower leg: No edema.     Left lower leg: No edema.  Skin:    General: Skin is warm and dry.     Capillary Refill: Capillary refill takes less than 2 seconds.  Neurological:     General: No focal deficit present.     Mental Status: She is  alert and oriented to person, place, and time.  Psychiatric:        Mood and Affect: Mood normal.        Behavior: Behavior normal.    Labs reviewed: Basic Metabolic Panel: Recent Labs    02/15/21 1154 06/18/21 1023 10/31/21 1429  NA 140 140 136  K 4.3 3.6 4.3  CL 107 107 101  CO2 '26 25 25  ' GLUCOSE 129* 121* 126*  BUN 15 13 24*  CREATININE 1.23* 0.95 1.38*  CALCIUM 10.1 10.1 10.5*  TSH 1.21  --   --    Liver Function Tests: Recent Labs    02/15/21 1154  AST 19  ALT 15  BILITOT 0.4  PROT 7.3   No results for input(s): LIPASE, AMYLASE in the last 8760 hours. No results for input(s): AMMONIA in the last 8760 hours. CBC: Recent Labs    04/29/21 0000 06/18/21 1023 06/28/21 1125 10/11/21 1049  WBC 6.6 5.3 6.2 5.2  NEUTROABS 3,769  --  3,553 3,110  HGB 12.9 12.0 12.4 12.5  HCT 40.5 35.4* 39.0 38.6  MCV 89.6 86.1 91.5 89.6  PLT 222 205 222 219   Lipid Panel: Recent Labs    06/28/21 1125  CHOL 124  HDL 60  LDLCALC 47  TRIG 91  CHOLHDL 2.1   TSH: Recent Labs    02/15/21 1154  TSH 1.21   A1C: Lab Results  Component Value Date   HGBA1C 7.0 (H) 10/31/2021     Assessment/Plan 1. Type 2 diabetes mellitus with stage 2 chronic kidney disease, without long-term current use of insulin (HCC) - A1c 7.0 10/31/2021 - metformin recently added due to prednisone prescription for suspected GCA - cont metformin - cont glipizide - A1c- future  2.  Giant cell arteritis (HCC) - headaches with eye/temporal pain x 2 months - seen by Dr. Katy Fitch recently- suspects GCA - referred to Dr. Stanford Breed- vascular  - Bilateral temporal artery biopsy scheduled 11/07/2021 - ESR 36 10/11/2021, CRP 6.6 10/11/2021  3. Nonintractable headache, unspecified chronicity pattern, unspecified headache type - improved since starting prednisone daily - see above  4. Pain of both eyes - improved since starting prednisone daily - no visual disturbances at this time  - see above  Total  time: 20 minutes. Greater than 50% of total time spent doing patient education on symptom/medication management regarding diabetes and headaches.      Next appt: Visit date not found  [place], [facility] [phone]

## 2021-10-31 NOTE — Progress Notes (Addendum)
PCP - Sherrie Mustache, MD Cardiologist - denies  PPM/ICD - denies Device Orders - n/a Rep Notified - n/a  Chest x-ray - 03/18/2021 EKG - 10/31/2021 Stress Test - denies ECHO - 05/26/2016 Cardiac Cath - denies  Sleep Study - denies CPAP - n/a  Fasting Blood Sugar - 98 - 105 Checks Blood Sugar once a day CBG today - 126 A1C - done in PAT 10/31/2021  Blood Thinner Instructions: n/a  Aspirin Instructions: Patient was instructed: As of today, STOP taking any Aspirin (unless otherwise instructed by your surgeon) Aleve, Naproxen, Ibuprofen, Motrin, Advil, Goody's, BC's, all herbal medications, fish oil, diclofenac sodium (VOLTAREN) and all vitamins.  ERAS Protcol - n/a  COVID TEST- no - ambulatory surgery   Anesthesia review:  yes -abnormal EKG in PAT.   Patient denies shortness of breath, fever, cough and chest pain at PAT appointment   All instructions explained to the patient, with a verbal understanding of the material. Patient agrees to go over the instructions while at home for a better understanding. Patient also instructed to self quarantine after being tested for COVID-19. The opportunity to ask questions was provided.

## 2021-11-07 ENCOUNTER — Encounter (HOSPITAL_COMMUNITY)
Admission: RE | Disposition: A | Discharge status: Home / Self Care | Payer: Self-pay | Source: Home / Self Care | Attending: Vascular Surgery

## 2021-11-07 ENCOUNTER — Ambulatory Visit (HOSPITAL_COMMUNITY): Payer: Medicare Other | Admitting: Physician Assistant

## 2021-11-07 ENCOUNTER — Other Ambulatory Visit: Payer: Self-pay

## 2021-11-07 ENCOUNTER — Ambulatory Visit (HOSPITAL_COMMUNITY): Payer: Medicare Other | Admitting: Anesthesiology

## 2021-11-07 ENCOUNTER — Encounter (HOSPITAL_COMMUNITY): Payer: Self-pay | Admitting: Vascular Surgery

## 2021-11-07 ENCOUNTER — Ambulatory Visit (HOSPITAL_COMMUNITY)
Admission: RE | Admit: 2021-11-07 | Discharge: 2021-11-07 | Disposition: A | Discharge status: Home / Self Care | Payer: Medicare Other | Attending: Vascular Surgery | Admitting: Vascular Surgery

## 2021-11-07 DIAGNOSIS — I708 Atherosclerosis of other arteries: Secondary | ICD-10-CM | POA: Insufficient documentation

## 2021-11-07 DIAGNOSIS — M1991 Primary osteoarthritis, unspecified site: Secondary | ICD-10-CM | POA: Diagnosis not present

## 2021-11-07 DIAGNOSIS — E1122 Type 2 diabetes mellitus with diabetic chronic kidney disease: Secondary | ICD-10-CM | POA: Diagnosis not present

## 2021-11-07 DIAGNOSIS — E890 Postprocedural hypothyroidism: Secondary | ICD-10-CM | POA: Diagnosis not present

## 2021-11-07 DIAGNOSIS — I129 Hypertensive chronic kidney disease with stage 1 through stage 4 chronic kidney disease, or unspecified chronic kidney disease: Secondary | ICD-10-CM | POA: Diagnosis not present

## 2021-11-07 DIAGNOSIS — N182 Chronic kidney disease, stage 2 (mild): Secondary | ICD-10-CM | POA: Diagnosis not present

## 2021-11-07 DIAGNOSIS — M316 Other giant cell arteritis: Secondary | ICD-10-CM

## 2021-11-07 DIAGNOSIS — R519 Headache, unspecified: Secondary | ICD-10-CM | POA: Insufficient documentation

## 2021-11-07 DIAGNOSIS — Z6841 Body Mass Index (BMI) 40.0 and over, adult: Secondary | ICD-10-CM | POA: Diagnosis not present

## 2021-11-07 DIAGNOSIS — K219 Gastro-esophageal reflux disease without esophagitis: Secondary | ICD-10-CM | POA: Insufficient documentation

## 2021-11-07 DIAGNOSIS — K449 Diaphragmatic hernia without obstruction or gangrene: Secondary | ICD-10-CM | POA: Insufficient documentation

## 2021-11-07 DIAGNOSIS — Z7984 Long term (current) use of oral hypoglycemic drugs: Secondary | ICD-10-CM | POA: Insufficient documentation

## 2021-11-07 HISTORY — PX: ARTERY BIOPSY: SHX891

## 2021-11-07 LAB — GLUCOSE, CAPILLARY
Glucose-Capillary: 100 mg/dL — ABNORMAL HIGH (ref 70–99)
Glucose-Capillary: 138 mg/dL — ABNORMAL HIGH (ref 70–99)

## 2021-11-07 SURGERY — BIOPSY TEMPORAL ARTERY
Anesthesia: Monitor Anesthesia Care | Site: Head | Laterality: Bilateral

## 2021-11-07 MED ORDER — FENTANYL CITRATE (PF) 250 MCG/5ML IJ SOLN
INTRAMUSCULAR | Status: AC
  Filled 2021-11-07: qty 5

## 2021-11-07 MED ORDER — PHENYLEPHRINE 40 MCG/ML (10ML) SYRINGE FOR IV PUSH (FOR BLOOD PRESSURE SUPPORT)
PREFILLED_SYRINGE | INTRAVENOUS | Status: DC | PRN
  Administered 2021-11-07 (×2): 80 ug via INTRAVENOUS
  Administered 2021-11-07: 40 ug via INTRAVENOUS
  Administered 2021-11-07: 80 ug via INTRAVENOUS
  Administered 2021-11-07: 40 ug via INTRAVENOUS
  Administered 2021-11-07: 80 ug via INTRAVENOUS

## 2021-11-07 MED ORDER — DEXMEDETOMIDINE (PRECEDEX) IN NS 20 MCG/5ML (4 MCG/ML) IV SYRINGE
PREFILLED_SYRINGE | INTRAVENOUS | Status: DC | PRN
  Administered 2021-11-07: 4 ug via INTRAVENOUS
  Administered 2021-11-07 (×2): 8 ug via INTRAVENOUS

## 2021-11-07 MED ORDER — LIDOCAINE 2% (20 MG/ML) 5 ML SYRINGE
INTRAMUSCULAR | Status: DC | PRN
  Administered 2021-11-07: 40 mg via INTRAVENOUS

## 2021-11-07 MED ORDER — LIDOCAINE 2% (20 MG/ML) 5 ML SYRINGE
INTRAMUSCULAR | Status: AC
  Filled 2021-11-07: qty 5

## 2021-11-07 MED ORDER — PROPOFOL 500 MG/50ML IV EMUL
INTRAVENOUS | Status: DC | PRN
  Administered 2021-11-07: 50 ug/kg/min via INTRAVENOUS

## 2021-11-07 MED ORDER — CHLORHEXIDINE GLUCONATE 4 % EX LIQD: 60.0000 mL | Freq: Once | CUTANEOUS | Status: DC

## 2021-11-07 MED ORDER — SODIUM CHLORIDE 0.9 % IV SOLN: INTRAVENOUS | Status: DC

## 2021-11-07 MED ORDER — LACTATED RINGERS IV SOLN: INTRAVENOUS | Status: DC

## 2021-11-07 MED ORDER — ORAL CARE MOUTH RINSE: 15.0000 mL | Freq: Once | OROMUCOSAL | Status: AC

## 2021-11-07 MED ORDER — ONDANSETRON HCL 4 MG/2ML IJ SOLN
4.0000 mg | Freq: Once | INTRAMUSCULAR | Status: AC | PRN
  Administered 2021-11-07: 09:00:00 4 mg via INTRAVENOUS

## 2021-11-07 MED ORDER — CHLORHEXIDINE GLUCONATE 0.12 % MT SOLN
15.0000 mL | Freq: Once | OROMUCOSAL | Status: AC
  Administered 2021-11-07: 08:00:00 15 mL via OROMUCOSAL
  Filled 2021-11-07: qty 15

## 2021-11-07 MED ORDER — FENTANYL CITRATE (PF) 100 MCG/2ML IJ SOLN: 25.0000 ug | INTRAMUSCULAR | Status: DC | PRN

## 2021-11-07 MED ORDER — OXYCODONE HCL 5 MG PO TABS: 5.0000 mg | ORAL_TABLET | Freq: Once | ORAL | Status: DC | PRN

## 2021-11-07 MED ORDER — OXYCODONE HCL 5 MG/5ML PO SOLN: 5.0000 mg | Freq: Once | ORAL | Status: DC | PRN

## 2021-11-07 MED ORDER — 0.9 % SODIUM CHLORIDE (POUR BTL) OPTIME
TOPICAL | Status: DC | PRN
  Administered 2021-11-07: 09:00:00 1000 mL

## 2021-11-07 MED ORDER — DEXMEDETOMIDINE (PRECEDEX) IN NS 20 MCG/5ML (4 MCG/ML) IV SYRINGE
PREFILLED_SYRINGE | INTRAVENOUS | Status: AC
  Filled 2021-11-07: qty 5

## 2021-11-07 MED ORDER — CEFAZOLIN SODIUM-DEXTROSE 2-4 GM/100ML-% IV SOLN
2.0000 g | INTRAVENOUS | Status: AC
  Administered 2021-11-07: 08:00:00 2 g via INTRAVENOUS
  Filled 2021-11-07: qty 100

## 2021-11-07 MED ORDER — MIDAZOLAM HCL 2 MG/2ML IJ SOLN
INTRAMUSCULAR | Status: AC
  Filled 2021-11-07: qty 2

## 2021-11-07 MED ORDER — ONDANSETRON HCL 4 MG/2ML IJ SOLN
INTRAMUSCULAR | Status: AC
  Filled 2021-11-07: qty 2

## 2021-11-07 MED ORDER — LIDOCAINE HCL (PF) 1 % IJ SOLN
INTRAMUSCULAR | Status: DC | PRN
  Administered 2021-11-07: 8 mL

## 2021-11-07 MED ORDER — TRAMADOL HCL 50 MG PO TABS: 50.0000 mg | ORAL_TABLET | Freq: Four times a day (QID) | ORAL | 0 refills | Status: DC | PRN

## 2021-11-07 MED ORDER — PROPOFOL 10 MG/ML IV BOLUS
INTRAVENOUS | Status: DC | PRN
  Administered 2021-11-07 (×2): 50 mg via INTRAVENOUS

## 2021-11-07 MED ORDER — ACETAMINOPHEN 500 MG PO TABS
1000.0000 mg | ORAL_TABLET | Freq: Once | ORAL | Status: AC
  Administered 2021-11-07: 08:00:00 1000 mg via ORAL
  Filled 2021-11-07: qty 2

## 2021-11-07 MED ORDER — AMISULPRIDE (ANTIEMETIC) 5 MG/2ML IV SOLN: 10.0000 mg | Freq: Once | INTRAVENOUS | Status: DC | PRN

## 2021-11-07 MED ORDER — LIDOCAINE HCL 1 % IJ SOLN
INTRAMUSCULAR | Status: AC
  Filled 2021-11-07: qty 20

## 2021-11-07 MED ORDER — PROPOFOL 10 MG/ML IV BOLUS
INTRAVENOUS | Status: AC
  Filled 2021-11-07: qty 20

## 2021-11-07 MED ORDER — PROPOFOL 1000 MG/100ML IV EMUL
INTRAVENOUS | Status: AC
  Filled 2021-11-07: qty 100

## 2021-11-07 SURGICAL SUPPLY — 51 items
ADH SKN CLS APL DERMABOND .7 (GAUZE/BANDAGES/DRESSINGS) ×2
APL PRP STRL LF DISP 70% ISPRP (MISCELLANEOUS) ×1
APL SKNCLS STERI-STRIP NONHPOA (GAUZE/BANDAGES/DRESSINGS) ×1
BAG COUNTER SPONGE SURGICOUNT (BAG) ×3 IMPLANT
BAG SPNG CNTER NS LX DISP (BAG) ×1
BAG SURGICOUNT SPONGE COUNTING (BAG) ×1
BALL CTTN LRG ABS STRL LF (GAUZE/BANDAGES/DRESSINGS) ×1
BENZOIN TINCTURE PRP APPL 2/3 (GAUZE/BANDAGES/DRESSINGS) ×4 IMPLANT
CANISTER SUCT 3000ML PPV (MISCELLANEOUS) ×4 IMPLANT
CHLORAPREP W/TINT 26 (MISCELLANEOUS) ×4 IMPLANT
CLOSURE WOUND 1/2 X4 (GAUZE/BANDAGES/DRESSINGS) ×1
CNTNR URN SCR LID CUP LEK RST (MISCELLANEOUS) ×2 IMPLANT
CONT SPEC 4OZ STRL OR WHT (MISCELLANEOUS) ×3
COTTONBALL LRG STERILE PKG (GAUZE/BANDAGES/DRESSINGS) ×4 IMPLANT
COVER SURGICAL LIGHT HANDLE (MISCELLANEOUS) ×4 IMPLANT
DECANTER SPIKE VIAL GLASS SM (MISCELLANEOUS) ×4 IMPLANT
DERMABOND ADVANCED (GAUZE/BANDAGES/DRESSINGS) ×4
DERMABOND ADVANCED .7 DNX12 (GAUZE/BANDAGES/DRESSINGS) ×2 IMPLANT
DRAPE OPHTHALMIC 77X100 STRL (CUSTOM PROCEDURE TRAY) ×6 IMPLANT
ELECT NDL BLADE 2-5/6 (NEEDLE) ×2 IMPLANT
ELECT NEEDLE BLADE 2-5/6 (NEEDLE) ×3 IMPLANT
ELECT REM PT RETURN 9FT ADLT (ELECTROSURGICAL) ×3
ELECTRODE REM PT RTRN 9FT ADLT (ELECTROSURGICAL) ×2 IMPLANT
GAUZE 4X4 16PLY ~~LOC~~+RFID DBL (SPONGE) ×4 IMPLANT
GEL ULTRASOUND 8.5O AQUASONIC (MISCELLANEOUS) ×4 IMPLANT
GLOVE SURG POLYISO LF SZ8 (GLOVE) ×4 IMPLANT
GOWN STRL REUS W/ TWL LRG LVL3 (GOWN DISPOSABLE) ×2 IMPLANT
GOWN STRL REUS W/ TWL XL LVL3 (GOWN DISPOSABLE) ×2 IMPLANT
GOWN STRL REUS W/TWL LRG LVL3 (GOWN DISPOSABLE) ×3
GOWN STRL REUS W/TWL XL LVL3 (GOWN DISPOSABLE) ×3
KIT BASIN OR (CUSTOM PROCEDURE TRAY) ×4 IMPLANT
KIT TURNOVER KIT B (KITS) ×4 IMPLANT
LOOP VESSEL MINI RED (MISCELLANEOUS) ×4 IMPLANT
MARKER SKIN DUAL TIP RULER LAB (MISCELLANEOUS) ×2 IMPLANT
NDL HYPO 25GX1X1/2 BEV (NEEDLE) ×2 IMPLANT
NEEDLE HYPO 25GX1X1/2 BEV (NEEDLE) ×3 IMPLANT
NS IRRIG 1000ML POUR BTL (IV SOLUTION) ×4 IMPLANT
PACK GENERAL/GYN (CUSTOM PROCEDURE TRAY) ×4 IMPLANT
PAD ARMBOARD 7.5X6 YLW CONV (MISCELLANEOUS) ×8 IMPLANT
STRIP CLOSURE SKIN 1/2X4 (GAUZE/BANDAGES/DRESSINGS) ×3 IMPLANT
SUCTION FRAZIER HANDLE 10FR (MISCELLANEOUS) ×3
SUCTION TUBE FRAZIER 10FR DISP (MISCELLANEOUS) ×2 IMPLANT
SUT MNCRL AB 4-0 PS2 18 (SUTURE) ×4 IMPLANT
SUT PROLENE 6 0 BV (SUTURE) IMPLANT
SUT SILK 3 0 (SUTURE)
SUT SILK 3-0 18XBRD TIE 12 (SUTURE) IMPLANT
SUT VIC AB 3-0 SH 27 (SUTURE) ×3
SUT VIC AB 3-0 SH 27X BRD (SUTURE) ×2 IMPLANT
SYR CONTROL 10ML LL (SYRINGE) ×4 IMPLANT
TOWEL GREEN STERILE (TOWEL DISPOSABLE) ×4 IMPLANT
WATER STERILE IRR 1000ML POUR (IV SOLUTION) ×4 IMPLANT

## 2021-11-07 NOTE — Transfer of Care (Signed)
Immediate Anesthesia Transfer of Care Note  Patient: Karla Willis  Procedure(s) Performed: BILATERAL TEMPORAL ARTERY BIOPSY (Bilateral: Head)  Patient Location: PACU  Anesthesia Type:MAC  Level of Consciousness: awake, alert , patient cooperative and responds to stimulation  Airway & Oxygen Therapy: Patient Spontanous Breathing and Patient connected to nasal cannula oxygen  Post-op Assessment: Report given to RN, Post -op Vital signs reviewed and stable and Patient moving all extremities X 4  Post vital signs: Reviewed and stable  Last Vitals:  Vitals Value Taken Time  BP 128/75 11/07/21 0937  Temp    Pulse 84 11/07/21 0938  Resp 13 11/07/21 0938  SpO2 98 % 11/07/21 0938  Vitals shown include unvalidated device data.  Last Pain:  Vitals:   11/07/21 0723  TempSrc:   PainSc: 0-No pain      Patients Stated Pain Goal: 1 (16/10/96 0454)  Complications: No notable events documented.

## 2021-11-07 NOTE — Interval H&P Note (Signed)
History and Physical Interval Note:  11/07/2021 7:36 AM  Karla Willis  has presented today for surgery, with the diagnosis of Temporal Arteritis.  The various methods of treatment have been discussed with the patient and family. After consideration of risks, benefits and other options for treatment, the patient has consented to  Procedure(s): BILATERAL TEMPORAL ARTERY BIOPSY (Bilateral) as a surgical intervention.  The patient's history has been reviewed, patient examined, no change in status, stable for surgery.  I have reviewed the patient's chart and labs.  Questions were answered to the patient's satisfaction.     [name]

## 2021-11-07 NOTE — Anesthesia Procedure Notes (Signed)
Procedure Name: MAC Date/Time: 11/07/2021 8:15 AM Performed by: Michele Rockers, CRNA Pre-anesthesia Checklist: Patient identified, Emergency Drugs available, Suction available, Timeout performed and Patient being monitored Patient Re-evaluated:Patient Re-evaluated prior to induction Oxygen Delivery Method: Simple face mask

## 2021-11-07 NOTE — Anesthesia Postprocedure Evaluation (Signed)
Anesthesia Post Note  Patient: ADRIAUNA CAMPTON  Procedure(s) Performed: BILATERAL TEMPORAL ARTERY BIOPSY (Bilateral: Head)     Patient location during evaluation: PACU Anesthesia Type: MAC Level of consciousness: awake and alert Pain management: pain level controlled Vital Signs Assessment: post-procedure vital signs reviewed and stable Respiratory status: spontaneous breathing, nonlabored ventilation and respiratory function stable Cardiovascular status: blood pressure returned to baseline and stable Postop Assessment: no apparent nausea or vomiting Anesthetic complications: no   No notable events documented.  Last Vitals:  Vitals:   11/07/21 0950 11/07/21 1005  BP: 121/62 124/70  Pulse: 77 73  Resp: 16 14  Temp:  36.6 C  SpO2: 96% 94%    Last Pain:  Vitals:   11/07/21 1005  TempSrc:   PainSc: 1                  Lidia Collum

## 2021-11-07 NOTE — Anesthesia Preprocedure Evaluation (Addendum)
Anesthesia Evaluation  Patient identified by MRN, date of birth, ID band Patient awake    Reviewed: Allergy & Precautions, NPO status , Patient's Chart, lab work & pertinent test results  History of Anesthesia Complications Negative for: history of anesthetic complications  Airway Mallampati: III  TM Distance: >3 FB Neck ROM: Full    Dental   Pulmonary neg pulmonary ROS,    Pulmonary exam normal        Cardiovascular hypertension, Pt. on medications Normal cardiovascular exam     Neuro/Psych negative neurological ROS     GI/Hepatic Neg liver ROS, hiatal hernia, GERD  ,  Endo/Other  diabetes, Type 2, Oral Hypoglycemic AgentsHypothyroidism Morbid obesity  Renal/GU Renal InsufficiencyRenal disease  negative genitourinary   Musculoskeletal  (+) Arthritis ,   Abdominal   Peds  Hematology negative hematology ROS (+)   Anesthesia Other Findings  Temporal arteritis  Reproductive/Obstetrics                         Anesthesia Physical Anesthesia Plan  ASA: 3  Anesthesia Plan: MAC   Post-op Pain Management: Tylenol PO (pre-op)   Induction: Intravenous  PONV Risk Score and Plan: 2 and Propofol infusion, TIVA, Treatment may vary due to age or medical condition and Dexamethasone  Airway Management Planned: Natural Airway, Nasal Cannula and Simple Face Mask  Additional Equipment: None  Intra-op Plan:   Post-operative Plan:   Informed Consent: I have reviewed the patients History and Physical, chart, labs and discussed the procedure including the risks, benefits and alternatives for the proposed anesthesia with the patient or authorized representative who has indicated his/her understanding and acceptance.       Plan Discussed with:   Anesthesia Plan Comments:        Anesthesia Quick Evaluation

## 2021-11-07 NOTE — Op Note (Signed)
DATE OF SERVICE: 11/07/2021  PATIENT:  Karla Willis  86 y.o. female  PRE-OPERATIVE DIAGNOSIS:  possible temporal arteritis  POST-OPERATIVE DIAGNOSIS:  Same  PROCEDURE:   bilateral temporal artery biopsy  SURGEON:  Surgeon(s) and Role:    * [name], MD - Primary  ASSISTANT: none  ANESTHESIA:   local and MAC  EBL: minimal  BLOOD ADMINISTERED:none  DRAINS: none   LOCAL MEDICATIONS USED:  LIDOCAINE   SPECIMEN:  left and right temporal arteries  COUNTS: confirmed correct.  TOURNIQUET:  none  PATIENT DISPOSITION:  PACU - hemodynamically stable.   Delay start of Pharmacological VTE agent (>24hrs) due to surgical blood loss or risk of bleeding: no  INDICATION FOR PROCEDURE: [name] is a 86 y.o. female with possible temporal arteritis. After careful discussion of risks, benefits, and alternatives the patient was offered bilateral temporal artery biopsy. The patient understood and wished to proceed.  OPERATIVE FINDINGS: unremarkable bilateral temporal artery biopsy.  DESCRIPTION OF PROCEDURE: After identification of the patient in the pre-operative holding area, the patient was transferred to the operating room. The patient was positioned supine on the operating room table. Anesthesia was induced. The bilateral temples were prepped and draped in standard fashion. A surgical pause was performed confirming correct patient, procedure, and operative location.  The left temple was mapped with intraoperative ultrasound. The skin was marked and an incision made. The incision was carried down through the subcutaneous tissue until the temporal artery was identified. The artery was skeletonized. The artery was clamped proximally and distally.  The artery was transected and passed off the table for pathologic evaluation.  The proximal and distal ends of the artery were oversewn with 3-0 silk suture.  Hemostasis was achieved in the surgical bed.  The wound was closed with 3-0  Vicryl and 4-0 Monocryl.  Dermabond was applied.  The right temple was mapped with intraoperative ultrasound. The skin was marked and an incision made. The incision was carried down through the subcutaneous tissue until the temporal artery was identified. The artery was skeletonized. The artery was clamped proximally and distally.  The artery was transected and passed off the table for pathologic evaluation.  The proximal and distal ends of the artery were oversewn with 3-0 silk suture.  Hemostasis was achieved in the surgical bed.  The wound was closed with 3-0 Vicryl and 4-0 Monocryl.  Dermabond was applied.  Upon completion of the case instrument and sharps counts were confirmed correct. The patient was transferred to the PACU in good condition. I was present for all portions of the procedure.  Yevonne Aline. Stanford Breed, MD [facility] Phone Number: [phone] 11/07/2021 9:32 AM

## 2021-11-08 ENCOUNTER — Other Ambulatory Visit: Payer: Self-pay | Admitting: Nurse Practitioner

## 2021-11-08 DIAGNOSIS — L309 Dermatitis, unspecified: Secondary | ICD-10-CM

## 2021-11-10 LAB — SURGICAL PATHOLOGY

## 2021-11-12 ENCOUNTER — Other Ambulatory Visit: Payer: Self-pay | Admitting: Orthopedic Surgery

## 2021-11-12 DIAGNOSIS — M316 Other giant cell arteritis: Secondary | ICD-10-CM

## 2021-11-12 MED ORDER — PREDNISONE 10 MG PO TABS
ORAL_TABLET | ORAL | 0 refills | Status: AC
Start: 2021-11-12 — End: 2021-12-02

## 2021-11-12 NOTE — Progress Notes (Signed)
Temporal biopsy negative for GCA. Results discussed with patient. Plan to wean off prednisone. New prescription sent to pharmacy. Will discuss weaning off metformin with PCP 12/06/2020 appointment.

## 2021-11-17 ENCOUNTER — Other Ambulatory Visit: Payer: Self-pay | Admitting: Nurse Practitioner

## 2021-11-17 DIAGNOSIS — M5441 Lumbago with sciatica, right side: Secondary | ICD-10-CM

## 2021-11-17 DIAGNOSIS — G8929 Other chronic pain: Secondary | ICD-10-CM

## 2021-11-18 NOTE — Telephone Encounter (Signed)
Patient has request refill on medication "Cymbalta 20mg ". Patient last refill was 06/10/2021. Patient medication has High Risk Warnings. Medication pend and sent to PCP Dewaine Oats Carlos American, NP for approval. Please Advise.

## 2021-12-06 ENCOUNTER — Ambulatory Visit: Payer: Medicare Other | Admitting: Nurse Practitioner

## 2021-12-13 ENCOUNTER — Ambulatory Visit (INDEPENDENT_AMBULATORY_CARE_PROVIDER_SITE_OTHER): Payer: Medicare Other | Admitting: Nurse Practitioner

## 2021-12-13 ENCOUNTER — Encounter: Payer: Self-pay | Admitting: Nurse Practitioner

## 2021-12-13 ENCOUNTER — Other Ambulatory Visit: Payer: Self-pay

## 2021-12-13 VITALS — BP 110/70 | HR 94 | Temp 97.1°F | Ht 63.0 in | Wt 257.0 lb

## 2021-12-13 DIAGNOSIS — Z23 Encounter for immunization: Secondary | ICD-10-CM

## 2021-12-13 DIAGNOSIS — M7989 Other specified soft tissue disorders: Secondary | ICD-10-CM | POA: Diagnosis not present

## 2021-12-13 DIAGNOSIS — E1122 Type 2 diabetes mellitus with diabetic chronic kidney disease: Secondary | ICD-10-CM

## 2021-12-13 DIAGNOSIS — M1000 Idiopathic gout, unspecified site: Secondary | ICD-10-CM | POA: Diagnosis not present

## 2021-12-13 DIAGNOSIS — N3281 Overactive bladder: Secondary | ICD-10-CM

## 2021-12-13 DIAGNOSIS — Z6841 Body Mass Index (BMI) 40.0 and over, adult: Secondary | ICD-10-CM

## 2021-12-13 DIAGNOSIS — R0683 Snoring: Secondary | ICD-10-CM | POA: Diagnosis not present

## 2021-12-13 DIAGNOSIS — E034 Atrophy of thyroid (acquired): Secondary | ICD-10-CM

## 2021-12-13 DIAGNOSIS — N182 Chronic kidney disease, stage 2 (mild): Secondary | ICD-10-CM | POA: Diagnosis not present

## 2021-12-13 DIAGNOSIS — E785 Hyperlipidemia, unspecified: Secondary | ICD-10-CM | POA: Diagnosis not present

## 2021-12-13 DIAGNOSIS — R519 Headache, unspecified: Secondary | ICD-10-CM | POA: Diagnosis not present

## 2021-12-13 DIAGNOSIS — I10 Essential (primary) hypertension: Secondary | ICD-10-CM

## 2021-12-13 NOTE — Patient Instructions (Addendum)
Please sign a record release for ophthalmology  on check out.     Decrease norvasc 5 mg by mouth daily  -encouraged to elevate legs above level of heart as tolerates low sodium diet -compression hose as tolerates (on in am, off in pm)   To get COVID booster at local pharmacy

## 2021-12-13 NOTE — Progress Notes (Signed)
Careteam: Patient Care Team: Lauree Chandler, NP as PCP - General (Geriatric Medicine) Nicholas Lose, MD as Consulting Physician (Hematology and Oncology) [facility] [place] as Consulting Physician (Orthopedic Surgery)  PLACE OF SERVICE:  [place] Directive information Does Patient Have a Medical Advance Directive?: Yes, Type of Advance Directive: [place];Living will;Out of facility DNR (pink MOST or yellow form), Pre-existing out of facility DNR order (yellow form or pink MOST form): Yellow form placed in chart (order not valid for inpatient use);Pink MOST form placed in chart (order not valid for inpatient use), Does patient want to make changes to medical advance directive?: No - Patient declined  Allergies  Allergen Reactions   Actos [Pioglitazone] Swelling   Hydrocodone Nausea Only and Other (See Comments)    "bloated"   Codeine Rash   Neurontin [Gabapentin] Palpitations and Other (See Comments)    dizziness   Other Rash    Nuts--PECANS   Peanut-Containing Drug Products Rash    Chief Complaint  Patient presents with   Medical Management of Chronic Issues    3 month follow-up. Bilateral foot swelling. Patient c/o cough x 1 week. Patient would like area of concern examined on left side of head. Discuss need for shingrix, PPSV, covid booster, and eye exam. Flu vaccine today.      HPI: Patient is a 87 y.o. female for 3 month follow up   Pt has been having a non productive cough for a week , sometimes SOB when she does activities. Very sedentary at home.   Patient is no longer having migraines   States BP is stable at home usually SBP runs 100-130.   Glucose is usually 90's and up when taken in the morning. 12/12/21 AM glucose was 160's .   Diet: Describes diet as healthy   Review of Systems:  Review of Systems  Constitutional:  Negative for chills, fever and malaise/fatigue.  Respiratory:  Positive for cough and  shortness of breath. Negative for sputum production and wheezing.        SOB with walking   Cardiovascular:  Positive for leg swelling. Negative for chest pain and palpitations.  Gastrointestinal:  Negative for abdominal pain, constipation, diarrhea, heartburn, nausea and vomiting.  Genitourinary:  Negative for dysuria and frequency.  Musculoskeletal:  Negative for myalgias.  Neurological:  Negative for tingling, tremors and weakness.  Psychiatric/Behavioral:  Negative for depression. The patient has insomnia. The patient is not nervous/anxious.    Past Medical History:  Diagnosis Date   Anemia    Arthritis    Chronic idiopathic gout    06-13-2021  per pt last flare-up  > 2 yrs ago,  right foot   CKD (chronic kidney disease), stage II    Ductal carcinoma in situ (DCIS) of right breast 10/24/2013   right upper inner- DCIS, 12-16-2013  s/p right breast lumpectomy with two re-excision's,  no chemo/ radiation and completed anti-estrogen therapy 06/ 2020;  oncologist-- dr [name]--  released pt per lov note in epic 05-01-2020   Endometrial polyp    Essential tremor    occasional hand tremor   Full dentures    GERD (gastroesophageal reflux disease)    rarely takes tums   Hiatal hernia    History of 2019 novel coronavirus disease (COVID-19)    07/ 2020  w/ moderate symptoms that resolved ,  and 2021 w/ mild symptoms;  no hospitalzation for with time but has chronic occasional dry cough since  Hypertension    Hypothyroidism, postsurgical 1970   followed by pcp   OAB (overactive bladder)    Osteopenia    PMB (postmenopausal bleeding)    Pneumonia    Type 2 diabetes mellitus ([place])    followed by pcp   (06-13-2021  per pt only checks once per week, fasting sugar-- 101-120)   Wears hearing aid in both ears    per pt only aid working is right side, but does not wear all the time   Past Surgical History:  Procedure Laterality Date   ARTERY BIOPSY Bilateral 11/07/2021   Procedure:  BILATERAL TEMPORAL ARTERY BIOPSY;  Surgeon: [name], MD;  Location: [facility];  Service: Vascular;  Laterality: Bilateral;   BREAST BIOPSY Right 10/26/2013   BREAST LUMPECTOMY WITH NEEDLE LOCALIZATION Right 12/16/2013   Procedure: BREAST LUMPECTOMY WITH NEEDLE LOCALIZATION;  Surgeon: Edward Jolly, MD;  Location: [facility];  Service: General;  Laterality: Right;   CATARACT EXTRACTION W/ INTRAOCULAR LENS IMPLANT Bilateral 2019   [place] N/A 06/18/2021   Procedure: [place] RESECTION FOR POLYPS;  Surgeon: [name], MD;  Location: [facility];  Service: Gynecology;  Laterality: N/A;   DILATION AND CURETTAGE OF UTERUS  09/2000   @[facility]  by dr [name]   FINGER SURGERY     right hand-   RE-EXCISION OF BREAST LUMPECTOMY Right 01/30/2014   Procedure: RE-EXCISION OF BREAST LUMPECTOMY;  Surgeon: Edward Jolly, MD;  Location: [facility];  Service: General;  Laterality: Right;   RE-EXCISION OF BREAST LUMPECTOMY Right 03/06/2014   Procedure: RE-EXCISION OF BREAST LUMPECTOMY;  Surgeon: Edward Jolly, MD;  Location: [place] [facility];  Service: General;  Laterality: Right;   THYROID LOBECTOMY  1970   unilateral   TOTAL SHOULDER ARTHROPLASTY Left 03/04/2011   @[facility]    TOTAL SHOULDER ARTHROPLASTY Right 05/21/2016   Procedure: RIGHT TOTAL SHOULDER ARTHROPLASTY;  Surgeon: Ninetta Lights, MD;  Location: [place];  Service: Orthopedics;  Laterality: Right;   TUBAL LIGATION     yrs ago   Social History:   reports that she has never smoked. She has never used smokeless tobacco. She reports that she does not drink alcohol and does not use drugs.  Family History  Problem Relation Age of Onset   Diabetes Mother    Arthritis Father    Seizures Sister    Colon cancer Sister    Pneumonia Daughter     Cancer Sister        breast   Breast cancer Sister    Multiple sclerosis Son        Deceased     Medications: Patient's Medications  New Prescriptions   No medications on file  Previous Medications   ACETAMINOPHEN (TYLENOL) 650 MG CR TABLET    Take 1,300 mg by mouth 2 (two) times daily as needed for pain.   ALLOPURINOL (ZYLOPRIM) 100 MG TABLET    TAKE 1 TABLET BY MOUTH EVERY DAY FOR GOUT   AMLODIPINE (NORVASC) 10 MG TABLET    Take 1 tablet (10 mg total) by mouth daily.   ATORVASTATIN (LIPITOR) 10 MG TABLET    TAKE 1 TABLET(10 MG) BY MOUTH DAILY   CALCIUM-VITAMIN D PO    Take 1 tablet by mouth daily.   DICLOFENAC SODIUM (VOLTAREN) 1 % GEL    Apply 4 g  topically 4 (four) times daily as needed.   DULOXETINE (CYMBALTA) 20 MG CAPSULE    TAKE 1 CAPSULE(20 MG) BY MOUTH DAILY   GLIPIZIDE (GLUCOTROL XL) 5 MG 24 HR TABLET    TAKE 1 TABLET BY MOUTH EVERY DAY WITH BREAKFAST   IBUPROFEN (ADVIL) 400 MG TABLET    Take 0.5 tablets (200 mg total) by mouth every 6 (six) hours as needed.   LEVOTHYROXINE (SYNTHROID) 100 MCG TABLET    TAKE 1 TABLET BY MOUTH EVERY DAY 30 MINUTES BEFORE BREAKFAST ON AN EMPTY STOMACH   LOSARTAN (COZAAR) 50 MG TABLET    TAKE 1 TABLET(50 MG) BY MOUTH DAILY   METFORMIN (GLUCOPHAGE) 500 MG TABLET    Take 1 tablet (500 mg total) by mouth 2 (two) times daily with a meal.   TRAMADOL (ULTRAM) 50 MG TABLET    Take 1 tablet (50 mg total) by mouth every 6 (six) hours as needed.   TRIAMCINOLONE CREAM (KENALOG) 0.1 %    APPLY EXTERNALLY TO THE AFFECTED AREA TWICE DAILY  Modified Medications   No medications on file  Discontinued Medications   No medications on file    Physical Exam:  Vitals:   12/13/21 1604  BP: 110/70  Pulse: 94  Temp: (!) 97.1 F (36.2 C)  TempSrc: Temporal  SpO2: 98%  Weight: 257 lb (116.6 kg)  Height: 5\' 3"  (1.6 m)   Body mass index is 45.53 kg/m. Wt Readings from Last 3 Encounters:  12/13/21 257 lb (116.6 kg)  11/07/21 257 lb (116.6 kg)  10/31/21  257 lb 12.8 oz (116.9 kg)    Physical Exam Constitutional:      General: She is not in acute distress.    Appearance: She is obese. She is not ill-appearing, toxic-appearing or diaphoretic.  Eyes:     Conjunctiva/sclera: Conjunctivae normal.  Cardiovascular:     Rate and Rhythm: Normal rate and regular rhythm.     Pulses: Normal pulses.     Heart sounds: Normal heart sounds.  Pulmonary:     Effort: Pulmonary effort is normal.     Breath sounds: Normal breath sounds. No wheezing.  Abdominal:     General: Bowel sounds are normal.  Musculoskeletal:        General: Normal range of motion.     Right lower leg: Edema present.     Left lower leg: Edema present.  Neurological:     Mental Status: She is alert and oriented to person, place, and time.  Psychiatric:        Mood and Affect: Mood normal.        Behavior: Behavior normal.        Thought Content: Thought content normal.        Judgment: Judgment normal.    Labs reviewed: Basic Metabolic Panel: Recent Labs    02/15/21 1154 06/18/21 1023 10/31/21 1429  NA 140 140 136  K 4.3 3.6 4.3  CL 107 107 101  CO2 26 25 25   GLUCOSE 129* 121* 126*  BUN 15 13 24*  CREATININE 1.23* 0.95 1.38*  CALCIUM 10.1 10.1 10.5*  TSH 1.21  --   --    Liver Function Tests: Recent Labs    02/15/21 1154  AST 19  ALT 15  BILITOT 0.4  PROT 7.3   No results for input(s): LIPASE, AMYLASE in the last 8760 hours. No results for input(s): AMMONIA in the last 8760 hours. CBC: Recent Labs    04/29/21 0000 06/18/21 1023 06/28/21  1125 10/11/21 1049  WBC 6.6 5.3 6.2 5.2  NEUTROABS 3,769  --  3,553 3,110  HGB 12.9 12.0 12.4 12.5  HCT 40.5 35.4* 39.0 38.6  MCV 89.6 86.1 91.5 89.6  PLT 222 205 222 219   Lipid Panel: Recent Labs    06/28/21 1125  CHOL 124  HDL 60  LDLCALC 47  TRIG 91  CHOLHDL 2.1   TSH: Recent Labs    02/15/21 1154  TSH 1.21   A1C: Lab Results  Component Value Date   HGBA1C 7.0 (H) 10/31/2021      Assessment/Plan  1. Type 2 diabetes mellitus with stage 2 chronic kidney disease, without long-term current use of insulin (HCC) -A1C is 7.0 on 10/31/21 -Manages disease with Metformin 500mg  BID  -Take Glipizide XL 5mg  QD  -Continue dietary modification and exercise -Encouraged lifestyle compliance, routine foot care/monitoring and to keep up with diabetic eye exams through ophthalmology   2. Nonintractable headache, unspecified chronicity pattern, unspecified headache type -no longer having headaches at this time  3. Idiopathic gout, unspecified chronicity, unspecified site -Allopurinol 100 mg QD -Stable condition, no issues, no flare ups.  4. Essential hypertension, benign -well controlled on amlodipine and losartan, due to LE edema will have her decrease amlodipine to 5 mg at this time and monitor LE edema and bp.  -continue low sodium diet.   5. Class 3 severe obesity due to excess calories with serious comorbidity and body mass index (BMI) of 45.0 to 49.9 in adult [facility]) -Discussed dietary modification and physical activity   6. Hyperlipidemia LDL goal <70 LDL at goal on Lipitor 10 mg QD  7. Hypothyroidism due to acquired atrophy of thyroid - Last TSH was 1.21 on 02/14/21 -stable, no issues with medication   8. Overactive bladder -ongoing, Gets up to urinate 3 times at night   9. Leg swelling -Reduce Amlodipine from 10 mg to 5 mg  -Encouraged to use ted hose. -Elevate legs above the heart as she tolerates it.  -Educated to only consume a low sodium diet.   10. Need for influenza vaccination - Flu Vaccine QUAD High Dose(Fluad)  11. Snores -Difficulty sleeping at night  -Day time sleepiness  - Ambulatory referral to Pulmonology   Next appt: Return in about 1 month (around 01/10/2022). For blood pressure and LE edema   Aliviya Schoeller K. Harle Battiest I personally was present during the history, physical exam and medical decision-making activities of this service and  have verified that the service and findings are accurately documented in the students note  [facility] [phone]

## 2022-01-10 ENCOUNTER — Other Ambulatory Visit: Payer: Self-pay

## 2022-01-10 ENCOUNTER — Ambulatory Visit (INDEPENDENT_AMBULATORY_CARE_PROVIDER_SITE_OTHER): Payer: Medicare Other | Admitting: Nurse Practitioner

## 2022-01-10 ENCOUNTER — Encounter: Payer: Self-pay | Admitting: Nurse Practitioner

## 2022-01-10 VITALS — BP 130/78 | Temp 97.1°F | Ht 63.0 in | Wt 258.4 lb

## 2022-01-10 DIAGNOSIS — E1122 Type 2 diabetes mellitus with diabetic chronic kidney disease: Secondary | ICD-10-CM

## 2022-01-10 DIAGNOSIS — M7989 Other specified soft tissue disorders: Secondary | ICD-10-CM

## 2022-01-10 DIAGNOSIS — R519 Headache, unspecified: Secondary | ICD-10-CM | POA: Diagnosis not present

## 2022-01-10 DIAGNOSIS — N182 Chronic kidney disease, stage 2 (mild): Secondary | ICD-10-CM | POA: Diagnosis not present

## 2022-01-10 DIAGNOSIS — I10 Essential (primary) hypertension: Secondary | ICD-10-CM | POA: Diagnosis not present

## 2022-01-10 MED ORDER — METFORMIN HCL 500 MG PO TABS: 500.0000 mg | ORAL_TABLET | Freq: Two times a day (BID) | ORAL | 3 refills | Status: DC

## 2022-01-10 NOTE — Patient Instructions (Signed)
Stop norvasc all together at this time ? ?Continue to check blood pressure after you have taken other medication ? ?Elevate legs daily with use COMPRESSION hose/sock  ? ?Low sodium diet.  ?

## 2022-01-10 NOTE — Progress Notes (Signed)
Careteam: Patient Care Team: Sharon Seller, NP as PCP - General (Geriatric Medicine) Serena Croissant, MD as Consulting Physician (Hematology and Oncology) Specialists, [facility] as Consulting Physician (Orthopedic Surgery)  PLACE OF SERVICE:  [facility]  Advanced Directive information Does Patient Have a Medical Advance Directive?: Yes, Type of Advance Directive: Healthcare Power of [place];Living will;Out of facility DNR (pink MOST or yellow form), Pre-existing out of facility DNR order (yellow form or pink MOST form): Yellow form placed in chart (order not valid for inpatient use);Pink MOST form placed in chart (order not valid for inpatient use), Does patient want to make changes to medical advance directive?: No - Patient declined  Allergies  Allergen Reactions   Actos [Pioglitazone] Swelling   Hydrocodone Nausea Only and Other (See Comments)    "bloated"   Codeine Rash   Neurontin [Gabapentin] Palpitations and Other (See Comments)    dizziness   Other Rash    Nuts--PECANS   Peanut-Containing Drug Products Rash    Chief Complaint  Patient presents with   Follow-up    4 week follow up on blood pressure. Patient states that blood pressure has been doing fine.     HPI: Patient is a 87 y.o. female for follow up on edema and HTN Reports swelling slightly better since decreasing norvasc to 5 mg but still has increase in LE in the afternoon. Unable to wear shoes in the evening.  She has not being using compression sock but just got some. Painful to touch but otherwise no pain or heat.  Blood pressures at home 113-130s/70s Recheck today 118/72  She is only eating 2 meals a day.   Continues to have headaches, not sharp like it was, tylenol effective.  Review of Systems:  Review of Systems  Constitutional:  Negative for chills and fever.  Respiratory:  Negative for cough and shortness of breath.   Cardiovascular:  Positive for leg swelling. Negative for  chest pain and palpitations.  Genitourinary:  Negative for dysuria and urgency.  Musculoskeletal:  Positive for myalgias.  Neurological:  Negative for dizziness and headaches.   Past Medical History:  Diagnosis Date   Anemia    Arthritis    Chronic idiopathic gout    06-13-2021  per pt last flare-up  > 2 yrs ago,  right foot   CKD (chronic kidney disease), stage II    Ductal carcinoma in situ (DCIS) of right breast 10/24/2013   right upper inner- DCIS, 12-16-2013  s/p right breast lumpectomy with two re-excision's,  no chemo/ radiation and completed anti-estrogen therapy 06/ 2020;  oncologist-- dr Pamelia Hoit--  released pt per lov note in epic 05-01-2020   Endometrial polyp    Essential tremor    occasional hand tremor   Full dentures    GERD (gastroesophageal reflux disease)    rarely takes tums   Hiatal hernia    History of 2019 novel coronavirus disease (COVID-19)    07/ 2020  w/ moderate symptoms that resolved ,  and 2021 w/ mild symptoms;  no hospitalzation for with time but has chronic occasional dry cough since   Hypertension    Hypothyroidism, postsurgical 1970   followed by pcp   OAB (overactive bladder)    Osteopenia    PMB (postmenopausal bleeding)    Pneumonia    Type 2 diabetes mellitus (HCC)    followed by pcp   (06-13-2021  per pt only checks once per week, fasting sugar-- 101-120)   Wears hearing  aid in both ears    per pt only aid working is right side, but does not wear all the time   Past Surgical History:  Procedure Laterality Date   ARTERY BIOPSY Bilateral 11/07/2021   Procedure: BILATERAL TEMPORAL ARTERY BIOPSY;  Surgeon: Leonie Douglas, MD;  Location[facility] OR;  Service: Vascular;  Laterality: Bilateral;   BREAST BIOPSY Right 10/26/2013   BREAST LUMPECTOMY WITH NEEDLE LOCALIZATION Right 12/16/2013   Procedure: BREAST LUMPECTOMY WITH NEEDLE LOCALIZATION;  Surgeon: Mariella Saa, MD;  Location: [facility];  Service: General;  Laterality:  Right;   CATARACT EXTRACTION W/ INTRAOCULAR LENS IMPLANT Bilateral 2019   CHOLECYSTECTOMY OPEN  1970   COLONOSCOPY     DILATATION & CURETTAGE/HYSTEROSCOPY WITH MYOSURE N/A 06/18/2021   Procedure: DILATATION & CURETTAGE/HYSTEROSCOPY WITH MYOSURE RESECTION FOR POLYPS;  Surgeon: Patton Salles, MD;  Location: [facility];  Service: Gynecology;  Laterality: N/A;   DILATION AND CURETTAGE OF UTERUS  09/2000   @[facility]  by dr Gaynell Face   FINGER SURGERY     right hand-   RE-EXCISION OF BREAST LUMPECTOMY Right 01/30/2014   Procedure: RE-EXCISION OF BREAST LUMPECTOMY;  Surgeon: Mariella Saa, MD;  Location: [facility];  Service: General;  Laterality: Right;   RE-EXCISION OF BREAST LUMPECTOMY Right 03/06/2014   Procedure: RE-EXCISION OF BREAST LUMPECTOMY;  Surgeon: Mariella Saa, MD;  Location: [facility];  Service: General;  Laterality: Right;   THYROID LOBECTOMY  1970   unilateral   TOTAL SHOULDER ARTHROPLASTY Left 03/04/2011   @[facility]    TOTAL SHOULDER ARTHROPLASTY Right 05/21/2016   Procedure: RIGHT TOTAL SHOULDER ARTHROPLASTY;  Surgeon: Loreta Ave, MD;  Location: [facility] OR;  Service: Orthopedics;  Laterality: Right;   TUBAL LIGATION     yrs ago   Social History:   reports that she has never smoked. She has never used smokeless tobacco. She reports that she does not drink alcohol and does not use drugs.  Family History  Problem Relation Age of Onset   Diabetes Mother    Arthritis Father    Seizures Sister    Colon cancer Sister    Pneumonia Daughter    Cancer Sister        breast   Breast cancer Sister    Multiple sclerosis Son        Deceased     Medications: Patient's Medications  New Prescriptions   No medications on file  Previous Medications   ACETAMINOPHEN (TYLENOL) 650 MG CR TABLET    Take 1,300 mg by mouth 2 (two) times daily as needed for pain.   ALLOPURINOL (ZYLOPRIM) 100 MG TABLET    TAKE 1 TABLET BY MOUTH EVERY  DAY FOR GOUT   AMLODIPINE (NORVASC) 10 MG TABLET    Take 1 tablet (10 mg total) by mouth daily.   ATORVASTATIN (LIPITOR) 10 MG TABLET    TAKE 1 TABLET(10 MG) BY MOUTH DAILY   CALCIUM-VITAMIN D PO    Take 1 tablet by mouth daily.   DICLOFENAC SODIUM (VOLTAREN) 1 % GEL    Apply 4 g topically 4 (four) times daily as needed.   DULOXETINE (CYMBALTA) 20 MG CAPSULE    TAKE 1 CAPSULE(20 MG) BY MOUTH DAILY   GLIPIZIDE (GLUCOTROL XL) 5 MG 24 HR TABLET    TAKE 1 TABLET BY MOUTH EVERY DAY WITH BREAKFAST   IBUPROFEN (ADVIL) 400 MG TABLET    Take 0.5 tablets (200 mg total) by  mouth every 6 (six) hours as needed.   LEVOTHYROXINE (SYNTHROID) 100 MCG TABLET    TAKE 1 TABLET BY MOUTH EVERY DAY 30 MINUTES BEFORE BREAKFAST ON AN EMPTY STOMACH   LOSARTAN (COZAAR) 50 MG TABLET    TAKE 1 TABLET(50 MG) BY MOUTH DAILY   METFORMIN (GLUCOPHAGE) 500 MG TABLET    Take 1 tablet (500 mg total) by mouth 2 (two) times daily with a meal.   TRAMADOL (ULTRAM) 50 MG TABLET    Take 1 tablet (50 mg total) by mouth every 6 (six) hours as needed.   TRIAMCINOLONE CREAM (KENALOG) 0.1 %    APPLY EXTERNALLY TO THE AFFECTED AREA TWICE DAILY  Modified Medications   No medications on file  Discontinued Medications   No medications on file    Physical Exam:  Vitals:   01/10/22 1126  BP: 130/78  Temp: (!) 97.1 F (36.2 C)  TempSrc: Temporal  SpO2: 96%  Weight: 258 lb 6.4 oz (117.2 kg)  Height: 5\' 3"  (1.6 m)   Body mass index is 45.77 kg/m. Wt Readings from Last 3 Encounters:  01/10/22 258 lb 6.4 oz (117.2 kg)  12/13/21 257 lb (116.6 kg)  11/07/21 257 lb (116.6 kg)    Physical Exam Constitutional:      General: She is not in acute distress.    Appearance: She is well-developed. She is not diaphoretic.  HENT:     Head: Normocephalic and atraumatic.  Eyes:     Conjunctiva/sclera: Conjunctivae normal.     Pupils: Pupils are equal, round, and reactive to light.  Cardiovascular:     Rate and Rhythm: Normal rate and regular  rhythm.     Heart sounds: Normal heart sounds.  Pulmonary:     Effort: Pulmonary effort is normal.     Breath sounds: Normal breath sounds.  Musculoskeletal:     Cervical back: Normal range of motion and neck supple.     Right lower leg: Edema (trace) present.     Left lower leg: Edema (trace) present.  Skin:    General: Skin is warm and dry.  Neurological:     Mental Status: She is alert.  Psychiatric:        Mood and Affect: Mood normal.    Labs reviewed: Basic Metabolic Panel: Recent Labs    02/15/21 1154 06/18/21 1023 10/31/21 1429  NA 140 140 136  K 4.3 3.6 4.3  CL 107 107 101  CO2 26 25 25   GLUCOSE 129* 121* 126*  BUN 15 13 24*  CREATININE 1.23* 0.95 1.38*  CALCIUM 10.1 10.1 10.5*  TSH 1.21  --   --    Liver Function Tests: Recent Labs    02/15/21 1154  AST 19  ALT 15  BILITOT 0.4  PROT 7.3   No results for input(s): LIPASE, AMYLASE in the last 8760 hours. No results for input(s): AMMONIA in the last 8760 hours. CBC: Recent Labs    04/29/21 0000 06/18/21 1023 06/28/21 1125 10/11/21 1049  WBC 6.6 5.3 6.2 5.2  NEUTROABS 3,769  --  3,553 3,110  HGB 12.9 12.0 12.4 12.5  HCT 40.5 35.4* 39.0 38.6  MCV 89.6 86.1 91.5 89.6  PLT 222 205 222 219   Lipid Panel: Recent Labs    06/28/21 1125  CHOL 124  HDL 60  LDLCALC 47  TRIG 91  CHOLHDL 2.1   TSH: Recent Labs    02/15/21 1154  TSH 1.21   A1C: Lab Results  Component Value Date  HGBA1C 7.0 (H) 10/31/2021     Assessment/Plan 1. Type 2 diabetes mellitus with stage 2 chronic kidney disease, without long-term current use of insulin (HCC) -continue current regimen, Encouraged dietary compliance, routine foot care/monitoring and to keep up with diabetic eye exams through ophthalmology  - metFORMIN (GLUCOPHAGE) 500 MG tablet; Take 1 tablet (500 mg total) by mouth 2 (two) times daily with a meal.  Dispense: 180 tablet; Refill: 3  2. Nonintractable headache, unspecified chronicity pattern,  unspecified headache type -still having headaches however improving. Tylenol effective.   3. Essential hypertension, benign Well controlled using decease norvasc, rechek of 118/72, due to improvement with decreasing norvasc with LE edema will stop at this time and have her continue to monitor BP at home.   4. Leg swelling -improved but still ongoing, reeducated about compression sock/stocking and encouraged to elevate legs above level of heart 45 mins daily, low sodium die.   Return in about 4 weeks (around 02/07/2022) for blood pressure check/swelling and lab work (at appt). Janene Harvey. Biagio Borg  [facility] [phone]

## 2022-01-21 ENCOUNTER — Institutional Professional Consult (permissible substitution): Payer: Medicare Other | Admitting: Adult Health

## 2022-01-28 ENCOUNTER — Encounter: Payer: Self-pay | Admitting: Nurse Practitioner

## 2022-01-28 ENCOUNTER — Ambulatory Visit (INDEPENDENT_AMBULATORY_CARE_PROVIDER_SITE_OTHER): Payer: Medicare Other | Admitting: Nurse Practitioner

## 2022-01-28 ENCOUNTER — Telehealth: Payer: Self-pay

## 2022-01-28 ENCOUNTER — Other Ambulatory Visit: Payer: Self-pay

## 2022-01-28 DIAGNOSIS — E2839 Other primary ovarian failure: Secondary | ICD-10-CM | POA: Diagnosis not present

## 2022-01-28 DIAGNOSIS — Z0000 Encounter for general adult medical examination without abnormal findings: Secondary | ICD-10-CM | POA: Diagnosis not present

## 2022-01-28 NOTE — Telephone Encounter (Signed)
[name], belles are scheduled for a virtual visit with your provider today.   ? ?Just as we do with appointments in the office, we must obtain your consent to participate.  Your consent will be active for this visit and any virtual visit you may have with one of our providers in the next 365 days.   ? ?If you have a MyChart account, I can also send a copy of this consent to you electronically.  All virtual visits are billed to your insurance company just like a traditional visit in the office.  As this is a virtual visit, video technology does not allow for your provider to perform a traditional examination.  This may limit your provider's ability to fully assess your condition.  If your provider identifies any concerns that need to be evaluated in person or the need to arrange testing such as labs, EKG, etc, we will make arrangements to do so.   ? ?Although advances in technology are sophisticated, we cannot ensure that it will always work on either your end or our end.  If the connection with a video visit is poor, we may have to switch to a telephone visit.  With either a video or telephone visit, we are not always able to ensure that we have a secure connection.   I need to obtain your verbal consent now.   Are you willing to proceed with your visit today?  ? ?Karla Willis has provided verbal consent on 01/28/2022 for a virtual visit (video or telephone). ? ? ?Carroll Kinds, CMA ?01/28/2022  2:19 PM ?  ?

## 2022-01-28 NOTE — Patient Instructions (Signed)
Karla Willis , ?Thank you for taking time to come for your Medicare Wellness Visit. I appreciate your ongoing commitment to your health goals. Please review the following plan we discussed and let me know if I can assist you in the future.  ? ?Screening recommendations/referrals: ?Colonoscopy aged out ?Mammogram aged out ?Bone Density order placed today- call to schedule [phone] ?Recommended yearly ophthalmology/optometry visit for glaucoma screening and checkup ?Recommended yearly dental visit for hygiene and checkup ? ?Vaccinations: ?Influenza vaccine up to date ?Pneumococcal vaccine up to date ?Tdap vaccine up to date ?Shingles vaccine DUE- recommend to get at your local pharmacy      ? ?Advanced directives: on file.  ? ?Conditions/risks identified: advanced age, diabetes, hypertension ? ?Next appointment: yearly for awv ? ? ?Preventive Care 85 Years and Older, Female ?Preventive care refers to lifestyle choices and visits with your health care provider that can promote health and wellness. ?What does preventive care include? ?A yearly physical exam. This is also called an annual well check. ?Dental exams once or twice a year. ?Routine eye exams. Ask your health care provider how often you should have your eyes checked. ?Personal lifestyle choices, including: ?Daily care of your teeth and gums. ?Regular physical activity. ?Eating a healthy diet. ?Avoiding tobacco and drug use. ?Limiting alcohol use. ?Practicing safe sex. ?Taking low-dose aspirin every day. ?Taking vitamin and mineral supplements as recommended by your health care provider. ?What happens during an annual well check? ?The services and screenings done by your health care provider during your annual well check will depend on your age, overall health, lifestyle risk factors, and family history of disease. ?Counseling  ?Your health care provider may ask you questions about your: ?Alcohol use. ?Tobacco use. ?Drug use. ?Emotional well-being. ?Home and  relationship well-being. ?Sexual activity. ?Eating habits. ?History of falls. ?Memory and ability to understand (cognition). ?Work and work Statistician. ?Reproductive health. ?Screening  ?You may have the following tests or measurements: ?Height, weight, and BMI. ?Blood pressure. ?Lipid and cholesterol levels. These may be checked every 5 years, or more frequently if you are over 40 years old. ?Skin check. ?Lung cancer screening. You may have this screening every year starting at age 61 if you have a 30-pack-year history of smoking and currently smoke or have quit within the past 15 years. ?Fecal occult blood test (FOBT) of the stool. You may have this test every year starting at age 41. ?Flexible sigmoidoscopy or colonoscopy. You may have a sigmoidoscopy every 5 years or a colonoscopy every 10 years starting at age 55. ?Hepatitis C blood test. ?Hepatitis B blood test. ?Sexually transmitted disease (STD) testing. ?Diabetes screening. This is done by checking your blood sugar (glucose) after you have not eaten for a while (fasting). You may have this done every 1-3 years. ?Bone density scan. This is done to screen for osteoporosis. You may have this done starting at age 9. ?Mammogram. This may be done every 1-2 years. Talk to your health care provider about how often you should have regular mammograms. ?Talk with your health care provider about your test results, treatment options, and if necessary, the need for more tests. ?Vaccines  ?Your health care provider may recommend certain vaccines, such as: ?Influenza vaccine. This is recommended every year. ?Tetanus, diphtheria, and acellular pertussis (Tdap, Td) vaccine. You may need a Td booster every 10 years. ?Zoster vaccine. You may need this after age 61. ?Pneumococcal 13-valent conjugate (PCV13) vaccine. One dose is recommended after age 16. ?Pneumococcal polysaccharide (PPSV23)  vaccine. One dose is recommended after age 71. ?Talk to your health care provider  about which screenings and vaccines you need and how often you need them. ?This information is not intended to replace advice given to you by your health care provider. Make sure you discuss any questions you have with your health care provider. ?Document Released: 11/23/2015 Document Revised: 07/16/2016 Document Reviewed: 08/28/2015 ?Elsevier Interactive Patient Education ? 2017 [employer]. ? ?Fall Prevention in the Home ?Falls can cause injuries. They can happen to people of all ages. There are many things you can do to make your home safe and to help prevent falls. ?What can I do on the outside of my home? ?Regularly fix the edges of walkways and driveways and fix any cracks. ?Remove anything that might make you trip as you walk through a door, such as a raised step or threshold. ?Trim any bushes or trees on the path to your home. ?Use bright outdoor lighting. ?Clear any walking paths of anything that might make someone trip, such as rocks or tools. ?Regularly check to see if handrails are loose or broken. Make sure that both sides of any steps have handrails. ?Any raised decks and porches should have guardrails on the edges. ?Have any leaves, snow, or ice cleared regularly. ?Use sand or salt on walking paths during winter. ?Clean up any spills in your garage right away. This includes oil or grease spills. ?What can I do in the bathroom? ?Use night lights. ?Install grab bars by the toilet and in the tub and shower. Do not use towel bars as grab bars. ?Use non-skid mats or decals in the tub or shower. ?If you need to sit down in the shower, use a plastic, non-slip stool. ?Keep the floor dry. Clean up any water that spills on the floor as soon as it happens. ?Remove soap buildup in the tub or shower regularly. ?Attach bath mats securely with double-sided non-slip rug tape. ?Do not have throw rugs and other things on the floor that can make you trip. ?What can I do in the bedroom? ?Use night lights. ?Make sure  that you have a light by your bed that is easy to reach. ?Do not use any sheets or blankets that are too big for your bed. They should not hang down onto the floor. ?Have a firm chair that has side arms. You can use this for support while you get dressed. ?Do not have throw rugs and other things on the floor that can make you trip. ?What can I do in the kitchen? ?Clean up any spills right away. ?Avoid walking on wet floors. ?Keep items that you use a lot in easy-to-reach places. ?If you need to reach something above you, use a strong step stool that has a grab bar. ?Keep electrical cords out of the way. ?Do not use floor polish or wax that makes floors slippery. If you must use wax, use non-skid floor wax. ?Do not have throw rugs and other things on the floor that can make you trip. ?What can I do with my stairs? ?Do not leave any items on the stairs. ?Make sure that there are handrails on both sides of the stairs and use them. Fix handrails that are broken or loose. Make sure that handrails are as long as the stairways. ?Check any carpeting to make sure that it is firmly attached to the stairs. Fix any carpet that is loose or worn. ?Avoid having throw rugs at the top or bottom  of the stairs. If you do have throw rugs, attach them to the floor with carpet tape. ?Make sure that you have a light switch at the top of the stairs and the bottom of the stairs. If you do not have them, ask someone to add them for you. ?What else can I do to help prevent falls? ?Wear shoes that: ?Do not have high heels. ?Have rubber bottoms. ?Are comfortable and fit you well. ?Are closed at the toe. Do not wear sandals. ?If you use a stepladder: ?Make sure that it is fully opened. Do not climb a closed stepladder. ?Make sure that both sides of the stepladder are locked into place. ?Ask someone to hold it for you, if possible. ?Clearly mark and make sure that you can see: ?Any grab bars or handrails. ?First and last steps. ?Where the edge of  each step is. ?Use tools that help you move around (mobility aids) if they are needed. These include: ?Canes. ?Walkers. ?Scooters. ?Crutches. ?Turn on the lights when you go into a dark area. Replace any l

## 2022-01-28 NOTE — Progress Notes (Signed)
? ?Subjective:  ? Karla Willis is a 87 y.o. female who presents for Medicare Annual (Subsequent) preventive examination. ? ?Review of Systems    ? ?Cardiac Risk Factors include: sedentary lifestyle;obesity (BMI >30kg/m2);hypertension;advanced age (>23mn, >>77women);diabetes mellitus;dyslipidemia ? ?   ?Objective:  ?  ?There were no vitals filed for this visit. ?There is no height or weight on file to calculate BMI. ? ?Advanced Directives 01/28/2022 01/10/2022 12/13/2021 10/31/2021 10/31/2021 09/30/2021 06/28/2021  ?Does Patient Have a Medical Advance Directive? Yes Yes Yes Yes Yes Yes Yes  ?Type of AParamedicof AEnglewoodOut of facility DNR (pink MOST or yellow form) HAshlandLiving will;Out of facility DNR (pink MOST or yellow form) HVoloLiving will;Out of facility DNR (pink MOST or yellow form) Healthcare Power of AWessingtonLiving will HEvergreenLiving will;Out of facility DNR (pink MOST or yellow form) HSan SimeonOut of facility DNR (pink MOST or yellow form)  ?Does patient want to make changes to medical advance directive? No - Patient declined No - Patient declined No - Patient declined No - Patient declined - No - Patient declined No - Patient declined  ?Copy of HClaytonin Chart? Yes - validated most recent copy scanned in chart (See row information) Yes - validated most recent copy scanned in chart (See row information) Yes - validated most recent copy scanned in chart (See row information) Yes - validated most recent copy scanned in chart (See row information) - Yes - validated most recent copy scanned in chart (See row information) Yes - validated most recent copy scanned in chart (See row information)  ?Pre-existing out of facility DNR order (yellow form or pink MOST form) Pink MOST form placed in chart (order not valid for inpatient use);Yellow form  placed in chart (order not valid for inpatient use) Yellow form placed in chart (order not valid for inpatient use);Pink MOST form placed in chart (order not valid for inpatient use) Yellow form placed in chart (order not valid for inpatient use);Pink MOST form placed in chart (order not valid for inpatient use) - - - Pink MOST form placed in chart (order not valid for inpatient use)  ? ? ?Current Medications (verified) ?Outpatient Encounter Medications as of 01/28/2022  ?Medication Sig  ? acetaminophen (TYLENOL) 650 MG CR tablet Take 1,300 mg by mouth 2 (two) times daily as needed for pain.  ? allopurinol (ZYLOPRIM) 100 MG tablet TAKE 1 TABLET BY MOUTH EVERY DAY FOR GOUT  ? atorvastatin (LIPITOR) 10 MG tablet TAKE 1 TABLET(10 MG) BY MOUTH DAILY  ? CALCIUM-VITAMIN D PO Take 1 tablet by mouth daily.  ? diclofenac sodium (VOLTAREN) 1 % GEL Apply 4 g topically 4 (four) times daily as needed.  ? DULoxetine (CYMBALTA) 20 MG capsule TAKE 1 CAPSULE(20 MG) BY MOUTH DAILY  ? glipiZIDE (GLUCOTROL XL) 5 MG 24 hr tablet TAKE 1 TABLET BY MOUTH EVERY DAY WITH BREAKFAST  ? ibuprofen (ADVIL) 400 MG tablet Take 0.5 tablets (200 mg total) by mouth every 6 (six) hours as needed.  ? levothyroxine (SYNTHROID) 100 MCG tablet TAKE 1 TABLET BY MOUTH EVERY DAY 30 MINUTES BEFORE BREAKFAST ON AN EMPTY STOMACH  ? losartan (COZAAR) 50 MG tablet TAKE 1 TABLET(50 MG) BY MOUTH DAILY  ? metFORMIN (GLUCOPHAGE) 500 MG tablet Take 1 tablet (500 mg total) by mouth 2 (two) times daily with a meal.  ? triamcinolone cream (KENALOG) 0.1 % APPLY EXTERNALLY TO  THE AFFECTED AREA TWICE DAILY  ? [DISCONTINUED] traMADol (ULTRAM) 50 MG tablet Take 1 tablet (50 mg total) by mouth every 6 (six) hours as needed.  ? ?No facility-administered encounter medications on file as of 01/28/2022.  ? ? ?Allergies (verified) ?Actos [pioglitazone], Hydrocodone, Tramadol hcl, Codeine, Neurontin [gabapentin], Other, and Peanut-containing drug products  ? ?History: ?Past Medical  History:  ?Diagnosis Date  ? Anemia   ? Arthritis   ? Chronic idiopathic gout   ? 06-13-2021  per pt last flare-up  > 2 yrs ago,  right foot  ? CKD (chronic kidney disease), stage II   ? 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 right upper inner- DCIS, 12-16-2013  s/p right breast lumpectomy with two re-excision's,  no chemo/ radiation and completed anti-estrogen therapy 06/ 2020;  oncologist-- dr [name]--  released pt per lov note in epic 05-01-2020  ? Endometrial polyp   ? Essential tremor   ? occasional hand tremor  ? Full dentures   ? GERD (gastroesophageal reflux disease)   ? rarely takes tums  ? Hiatal hernia   ? History of 2019 novel coronavirus disease (COVID-19)   ? 07/ 2020  w/ moderate symptoms that resolved ,  and 2021 w/ mild symptoms;  no hospitalzation for with time but has chronic occasional dry cough since  ? History of temporal artery biopsy   ? Hypertension   ? Hypothyroidism, postsurgical 1970  ? followed by pcp  ? OAB (overactive bladder)   ? Osteopenia   ? PMB (postmenopausal bleeding)   ? Pneumonia   ? Type 2 diabetes mellitus ([place])   ? followed by pcp   (06-13-2021  per pt only checks once per week, fasting sugar-- 101-120)  ? Wears hearing aid in both ears   ? per pt only aid working is right side, but does not wear all the time  ? ?Past Surgical History:  ?Procedure Laterality Date  ? ARTERY BIOPSY Bilateral 11/07/2021  ? Procedure: BILATERAL TEMPORAL ARTERY BIOPSY;  Surgeon: [name], MD;  Location: [facility] OR;  Service: Vascular;  Laterality: Bilateral;  ? BREAST BIOPSY Right 10/26/2013  ? BREAST LUMPECTOMY WITH NEEDLE LOCALIZATION Right 12/16/2013  ? Procedure: BREAST LUMPECTOMY WITH NEEDLE LOCALIZATION;  Surgeon: Edward Jolly, MD;  Location: [place];  Service: General;  Laterality: Right;  ? CATARACT EXTRACTION W/ INTRAOCULAR LENS IMPLANT Bilateral 2019  ? CHOLECYSTECTOMY OPEN  1970  ? COLONOSCOPY    ? DILATATION & CURETTAGE/HYSTEROSCOPY  WITH MYOSURE N/A 06/18/2021  ? Procedure: DILATATION & CURETTAGE/HYSTEROSCOPY WITH MYOSURE RESECTION FOR POLYPS;  Surgeon: [name], MD;  Location[facility];  Service: Gynecology;  Laterality: N/A;  ? DILATION AND CURETTAGE OF UTERUS  09/2000  ? '@[facility]  by dr [name]  ? FINGER SURGERY    ? right hand-  ? RE-EXCISION OF BREAST LUMPECTOMY Right 01/30/2014  ? Procedure: RE-EXCISION OF BREAST LUMPECTOMY;  Surgeon: Edward Jolly, MD;  Location: [place];  Service: General;  Laterality: Right;  ? RE-EXCISION OF BREAST LUMPECTOMY Right 03/06/2014  ? Procedure: RE-EXCISION OF BREAST LUMPECTOMY;  Surgeon: Edward Jolly, MD;  Location: [place];  Service: General;  Laterality: Right;  ? THYROID LOBECTOMY  1970  ? unilateral  ? TOTAL SHOULDER ARTHROPLASTY Left 03/04/2011  ? '@[facility]   ? TOTAL SHOULDER ARTHROPLASTY Right 05/21/2016  ? Procedure: RIGHT TOTAL SHOULDER ARTHROPLASTY;  Surgeon: Ninetta Lights, MD;  Location[facility]  OR;  Service: Orthopedics;  Laterality: Right;  ? TUBAL LIGATION    ? yrs ago  ? ?Family History  ?Problem Relation Age of Onset  ? Diabetes Mother   ? Arthritis Father   ? Seizures Sister   ? Colon cancer Sister   ? Pneumonia Daughter   ? Cancer Sister   ?     breast  ? Breast cancer Sister   ? Multiple sclerosis Son   ?     Deceased   ? ?Social History  ? ?Socioeconomic History  ? Marital status: Widowed  ?  Spouse name: Not on file  ? Number of children: Not on file  ? Years of education: Not on file  ? Highest education level: Not on file  ?Occupational History  ? Not on file  ?Tobacco Use  ? Smoking status: Never  ? Smokeless tobacco: Never  ?Vaping Use  ? Vaping Use: Never used  ?Substance and Sexual Activity  ? Alcohol use: No  ?  Alcohol/week: 0.0 standard drinks  ? Drug use: Never  ? Sexual activity: Not Currently  ?  Comment: menarche age 61,1st intercourse 87 yo-Fewer than 5 partners  ?Other Topics Concern  ? Not on file   ?Social History Narrative  ? Diet: No  ?   ? Do you drink/ eat things with caffeine? Yes  ?   ? Marital status:    Widowed                           What year were you married ?  1956  ?   ? Do you live in a house,

## 2022-01-28 NOTE — Progress Notes (Signed)
This service is provided via telemedicine ? ?No vital signs collected/recorded due to the encounter was a telemedicine visit.  ? ?Location of patient (ex: home, work):  Home ? ?Patient consents to a telephone visit:  Yes, see encounter dated 01/28/2022 ? ?Location of the provider (ex: office, home):  [place] ? ?Name of any referring provider:  N/A ? ?Names of all persons participating in the telemedicine service and their role in the encounter:  Sherrie Mustache, Nurse Practitioner, Carroll Kinds, CMA, and patient.  ? ?Time spent on call:  10 minutes with medical assistant ? ?

## 2022-02-03 ENCOUNTER — Encounter: Payer: Self-pay | Admitting: Adult Health

## 2022-02-03 ENCOUNTER — Other Ambulatory Visit: Payer: Self-pay

## 2022-02-03 ENCOUNTER — Ambulatory Visit (INDEPENDENT_AMBULATORY_CARE_PROVIDER_SITE_OTHER): Payer: Medicare Other | Admitting: Adult Health

## 2022-02-03 VITALS — BP 130/60 | HR 86 | Temp 98.1°F | Ht 63.0 in | Wt 252.8 lb

## 2022-02-03 DIAGNOSIS — R400 Somnolence: Secondary | ICD-10-CM

## 2022-02-03 DIAGNOSIS — R0683 Snoring: Secondary | ICD-10-CM

## 2022-02-03 DIAGNOSIS — G4700 Insomnia, unspecified: Secondary | ICD-10-CM | POA: Diagnosis not present

## 2022-02-03 NOTE — Assessment & Plan Note (Signed)
Insomnia patient is encouraged on healthy sleep regimen.  Advised to decrease her caffeine intake especially in the afternoon hours.  She may try melatonin as needed.  Sleep study is pending ?

## 2022-02-03 NOTE — Assessment & Plan Note (Signed)
Daytime sleepiness, snoring, restless sleep, obesity with BMI of 44 all suspicious for underlying sleep apnea. ?We will set patient up for home sleep study. ?Patient education on sleep apnea and healthy sleep regimen ?- discussed how weight can impact sleep and risk for sleep disordered breathing ?- discussed options to assist with weight loss: combination of diet modification, cardiovascular and strength training exercises ?  ?- had an extensive discussion regarding the adverse health consequences related to untreated sleep disordered breathing ?- specifically discussed the risks for hypertension, coronary artery disease, cardiac dysrhythmias, cerebrovascular disease, and diabetes ?- lifestyle modification discussed ?  ?- discussed how sleep disruption can increase risk of accidents, particularly when driving ?- safe driving practices were discussed ?  ? ?Plan  ?Patient Instructions  ?May try Melatonin At bedtime  As needed  insomnia .  ?Healthy sleep regimen  ?Do not drive if sleepy .  ?Work on healthy weight loss  ?Set up Home Sleep study .  ?Follow up in 6-8 weeks with Dr. Annamaria Boots  or Mariena Meares NP and As needed   ?  ? ?

## 2022-02-03 NOTE — Progress Notes (Signed)
? ?'@Patient'$  ID: Karla Willis, female    DOB: 1935/03/02, 87 y.o.   MRN: [id number] ? ?Chief Complaint  ?Patient presents with  ? Follow-up  ? ? ?Referring provider: ?Lauree Chandler, NP ? ?HPI: ?87 year old female seen for sleep consult January 29, 2020 for daytime sleepiness, snoring and restless sleep.  ?Medical history significant for diabetes, stage II chronic kidney disease, chronic headaches and hypertension ? ? ?TEST/EVENTS :  ? ?02/03/2022 Follow up : Snoring  ?Patient presents to reestablish.  Patient was seen for sleep consult January 29, 2020 for daytime sleepiness, snoring and restless sleep.  Patient was recommended for home sleep study but unfortunately did not follow-up.  Patient says she had several things come up and just was not able to come back until now.  Patient says she continues to have ongoing daytime sleepiness.  Restless sleep.  Feels very unrefreshed when she wakes up in the morning.  Takes her up to an hour to go to sleep.  She is very restless throughout the night.  Typically goes to bed about 11 PM can take up to an hour or 2 to go to sleep.  Is up to 3 times each night.  Gets up about 9:30 AM.  Feels sleepy when she wakes up. Feels unrefreshed. Has insomnia , takes up 1 hr to go to sleep  ?Typically takes in about 2 cups of coffee daily.  Does drink a cup of coffee at nighttime. ?Epworth score is 5 out of 24.  Denies any symptoms suspicious for cataplexy or sleep paralysis.  Current weight is 252 pounds with a BMI of 44. ?Denies any history of congestive heart failure or stroke.  Denies any sedating medications.   ? ?Social history: Lives at home alone, drives, never smoker , no alcohol. Widowed. Adult children. Retired .  ? ?Surgical history : Cholecystectomy 1970, Breast cancer s/p resection, Thyroid -partial resection-benign .  ? ?Family history: OSA  ? ?Medical history: DM , CKD-stage II, Chronic HA, HTN, hyperlipidemia, gout. ? ? ?Allergies  ?Allergen Reactions  ? Actos  [Pioglitazone] Swelling  ? Hydrocodone Nausea Only and Other (See Comments)  ?  "bloated"  ? Tramadol Hcl Itching  ? Codeine Rash  ? Neurontin [Gabapentin] Palpitations and Other (See Comments)  ?  dizziness  ? Other Rash  ?  Nuts--PECANS  ? Peanut-Containing Drug Products Rash  ? ? ?Immunization History  ?Administered Date(s) Administered  ? Fluad Quad(high Dose 65+) 10/08/2020, 12/13/2021  ? Influenza, High Dose Seasonal PF 09/01/2019  ? Influenza,inj,Quad PF,6+ Mos 09/07/2015, 07/25/2016, 09/22/2017  ? Influenza-Unspecified 08/10/2014, 10/04/2018  ? PFIZER(Purple Top)SARS-COV-2 Vaccination 12/17/2019, 01/07/2020, 12/25/2020  ? Pneumococcal Conjugate-13 01/14/2017  ? Pneumococcal-Unspecified 11/10/2013  ? Tdap 04/18/2016  ? Zoster, Live 04/18/2012  ? ? ?Past Medical History:  ?Diagnosis Date  ? Anemia   ? Arthritis   ? Chronic idiopathic gout   ? 06-13-2021  per pt last flare-up  > 2 yrs ago,  right foot  ? CKD (chronic kidney disease), stage II   ? 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 right upper inner- DCIS, 12-16-2013  s/p right breast lumpectomy with two re-excision's,  no chemo/ radiation and completed anti-estrogen therapy 06/ 2020;  oncologist-- dr [name]--  released pt per lov note in epic 05-01-2020  ? Endometrial polyp   ? Essential tremor   ? occasional hand tremor  ? Full dentures   ? GERD (gastroesophageal reflux disease)   ? rarely takes tums  ?  Hiatal hernia   ? History of 2019 novel coronavirus disease (COVID-19)   ? 07/ 2020  w/ moderate symptoms that resolved ,  and 2021 w/ mild symptoms;  no hospitalzation for with time but has chronic occasional dry cough since  ? History of temporal artery biopsy   ? Hypertension   ? Hypothyroidism, postsurgical 1970  ? followed by pcp  ? OAB (overactive bladder)   ? Osteopenia   ? PMB (postmenopausal bleeding)   ? Pneumonia   ? Type 2 diabetes mellitus ([place])   ? followed by pcp   (06-13-2021  per pt only checks once per week, fasting  sugar-- 101-120)  ? Wears hearing aid in both ears   ? per pt only aid working is right side, but does not wear all the time  ? ? ?Tobacco History: ?Social History  ? ?Tobacco Use  ?Smoking Status Never  ?Smokeless Tobacco Never  ? ?Counseling given: Not Answered ? ? ?Outpatient Medications Prior to Visit  ?Medication Sig Dispense Refill  ? acetaminophen (TYLENOL) 650 MG CR tablet Take 1,300 mg by mouth 2 (two) times daily as needed for pain.    ? allopurinol (ZYLOPRIM) 100 MG tablet TAKE 1 TABLET BY MOUTH EVERY DAY FOR GOUT 90 tablet 1  ? atorvastatin (LIPITOR) 10 MG tablet TAKE 1 TABLET(10 MG) BY MOUTH DAILY 90 tablet 1  ? CALCIUM-VITAMIN D PO Take 1 tablet by mouth daily.    ? diclofenac sodium (VOLTAREN) 1 % GEL Apply 4 g topically 4 (four) times daily as needed. 100 g 0  ? DULoxetine (CYMBALTA) 20 MG capsule TAKE 1 CAPSULE(20 MG) BY MOUTH DAILY 90 capsule 1  ? glipiZIDE (GLUCOTROL XL) 5 MG 24 hr tablet TAKE 1 TABLET BY MOUTH EVERY DAY WITH BREAKFAST 90 tablet 2  ? levothyroxine (SYNTHROID) 100 MCG tablet TAKE 1 TABLET BY MOUTH EVERY DAY 30 MINUTES BEFORE BREAKFAST ON AN EMPTY STOMACH 90 tablet 1  ? losartan (COZAAR) 50 MG tablet TAKE 1 TABLET(50 MG) BY MOUTH DAILY 90 tablet 1  ? metFORMIN (GLUCOPHAGE) 500 MG tablet Take 1 tablet (500 mg total) by mouth 2 (two) times daily with a meal. 180 tablet 3  ? triamcinolone cream (KENALOG) 0.1 % APPLY EXTERNALLY TO THE AFFECTED AREA TWICE DAILY 45 g 3  ? ibuprofen (ADVIL) 400 MG tablet Take 0.5 tablets (200 mg total) by mouth every 6 (six) hours as needed. (Patient not taking: Reported on 02/03/2022) 30 tablet 0  ? ?No facility-administered medications prior to visit.  ? ? ? ?Review of Systems:  ? ?Constitutional:   No  weight loss, night sweats,  Fevers, chills,  ?+fatigue, or  lassitude. ? ?HEENT:   No headaches,  Difficulty swallowing,  Tooth/dental problems, or  Sore throat,  ?              No sneezing, itching, ear ache, nasal congestion, post nasal drip,  ? ?CV:  No  chest pain,  Orthopnea, PND, swelling in lower extremities, anasarca, dizziness, palpitations, syncope.  ? ?GI  No heartburn, indigestion, abdominal pain, nausea, vomiting, diarrhea, change in bowel habits, loss of appetite, bloody stools.  ? ?Resp: No shortness of breath with exertion or at rest.  No excess mucus, no productive cough,  No non-productive cough,  No coughing up of blood.  No change in color of mucus.  No wheezing.  No chest wall deformity ? ?Skin: no rash or lesions. ? ?GU: no dysuria, change in color of urine, no urgency or  frequency.  No flank pain, no hematuria  ? ?MS:  No joint pain or swelling.  No decreased range of motion.  No back pain. ? ? ? ?Physical Exam ? ?BP 130/60 (BP Location: Left Arm, Patient Position: Sitting, Cuff Size: Large)   Pulse 86   Temp 98.1 ?F (36.7 ?C) (Oral)   Ht '5\' 3"'$  (1.6 m)   Wt 252 lb 12.8 oz (114.7 kg)   SpO2 98%   BMI 44.78 kg/m?  ? ?GEN: A/Ox3; pleasant , NAD, well nourished  ?  ?HEENT:  [place]/AT,   NOSE-clear, THROAT-clear, no lesions, no postnasal drip or exudate noted. Poor dentition , class II-III MP airway ? ?NECK:  Supple w/ fair ROM; no JVD; normal carotid impulses w/o bruits; no thyromegaly or nodules palpated; no lymphadenopathy.   ? ?RESP  Clear  P & A; w/o, wheezes/ rales/ or rhonchi. no accessory muscle use, no dullness to percussion ? ?CARD:  RRR, no m/r/g, 1-2+ peripheral edema, pulses intact, no cyanosis or clubbing. ? ?GI:   Soft & nt; nml bowel sounds; no organomegaly or masses detected.  ? ?Musco: Warm bil, no deformities or joint swelling noted.  ? ?Neuro: alert, no focal deficits noted.   ? ?Skin: Warm, no lesions or rashes ? ? ? ?Lab Results: ? ? ? ? ? ?BNP ?No results found for: BNP ? ?ProBNP ?No results found for: PROBNP ? ?Imaging: ?No results found. ? ? ? ? ?No results found for: NITRICOXIDE ? ? ? ? ? ?Assessment & Plan:  ? ?Daytime sleepiness ?Daytime sleepiness, snoring, restless sleep, obesity with BMI of 44 all suspicious for  underlying sleep apnea. ?We will set patient up for home sleep study. ?Patient education on sleep apnea and healthy sleep regimen ?- discussed how weight can impact sleep and risk for sleep disordered breathing ?- discu

## 2022-02-03 NOTE — Assessment & Plan Note (Signed)
Work on healthy weight loss 

## 2022-02-03 NOTE — Patient Instructions (Signed)
May try Melatonin At bedtime  As needed  insomnia .  ?Healthy sleep regimen  ?Do not drive if sleepy .  ?Work on healthy weight loss  ?Set up Home Sleep study .  ?Follow up in 6-8 weeks with Dr. Annamaria Boots  or Liesl Simons NP and As needed   ?

## 2022-02-04 ENCOUNTER — Other Ambulatory Visit: Payer: Self-pay | Admitting: Nurse Practitioner

## 2022-02-04 DIAGNOSIS — I10 Essential (primary) hypertension: Secondary | ICD-10-CM

## 2022-02-10 ENCOUNTER — Encounter: Payer: Self-pay | Admitting: Nurse Practitioner

## 2022-02-10 ENCOUNTER — Ambulatory Visit (INDEPENDENT_AMBULATORY_CARE_PROVIDER_SITE_OTHER): Payer: Medicare Other | Admitting: Nurse Practitioner

## 2022-02-10 VITALS — BP 126/80 | HR 97 | Temp 97.3°F | Ht 63.0 in | Wt 255.0 lb

## 2022-02-10 DIAGNOSIS — E1122 Type 2 diabetes mellitus with diabetic chronic kidney disease: Secondary | ICD-10-CM | POA: Diagnosis not present

## 2022-02-10 DIAGNOSIS — E039 Hypothyroidism, unspecified: Secondary | ICD-10-CM | POA: Diagnosis not present

## 2022-02-10 DIAGNOSIS — N182 Chronic kidney disease, stage 2 (mild): Secondary | ICD-10-CM

## 2022-02-10 DIAGNOSIS — I10 Essential (primary) hypertension: Secondary | ICD-10-CM | POA: Diagnosis not present

## 2022-02-10 DIAGNOSIS — M7989 Other specified soft tissue disorders: Secondary | ICD-10-CM

## 2022-02-10 NOTE — Patient Instructions (Addendum)
-  encouraged to elevate legs above level of heart as tolerates, low sodium diet, compression hose as tolerates (on in am, off in pm) ?Look online for compression hose that can zip  ?

## 2022-02-10 NOTE — Progress Notes (Signed)
? ? ?Careteam: ?Patient Care Team: ?Lauree Chandler, NP as PCP - General (Geriatric Medicine) ?Nicholas Lose, MD as Consulting Physician (Hematology and Oncology) ?[facility] [place] as Consulting Physician (Orthopedic Surgery) ? ?PLACE OF SERVICE:  ?[facility]  ?Advanced Directive information ?Does Patient Have a Medical Advance Directive?: Yes, Type of Advance Directive: [place];Out of facility DNR (pink MOST or yellow form), Pre-existing out of facility DNR order (yellow form or pink MOST form): Pink MOST form placed in chart (order not valid for inpatient use);Yellow form placed in chart (order not valid for inpatient use), Does patient want to make changes to medical advance directive?: No - Patient declined ? ?Allergies  ?Allergen Reactions  ? Actos [Pioglitazone] Swelling  ? Hydrocodone Nausea Only and Other (See Comments)  ?  "bloated"  ? Tramadol Hcl Itching  ? Codeine Rash  ? Neurontin [Gabapentin] Palpitations and Other (See Comments)  ?  dizziness  ? Other Rash  ?  Nuts--PECANS  ? Peanut-Containing Drug Products Rash  ? ? ?Chief Complaint  ?Patient presents with  ? Follow-up  ?  4 week follow-up on blood pressure and leg swelling.   ? ? ? ?HPI: Patient is a 87 y.o. female  for leg swelling  ? ?States the swelling has gone down a little but is still quite swollen and painful. States she can't fit into most of shoes any more. Swelling is bilaterally  ? ?Hypertension: BP runs 120's/ 70's at home. Takes Losartan 50 mg QD. Norvasc previously stopped due to leg swelling. No issues or concerns.  ? ?Diabetes: Glucose 156 this morning, but usually runs in the 90's to low 100's . Takes Metformin and glipizide. No issues or concerns.  ? ? ?Review of Systems:  ?Review of Systems  ?Constitutional:  Negative for chills and fever.  ?Respiratory:  Negative for cough, sputum production and shortness of breath.   ?Cardiovascular:  Positive for leg swelling. Negative for chest  pain and palpitations.  ?Gastrointestinal:  Negative for abdominal pain, constipation, diarrhea, heartburn, nausea and vomiting.  ?Genitourinary:  Negative for dysuria, frequency and urgency.  ?Psychiatric/Behavioral:  The patient does not have insomnia.   ? ?Past Medical History:  ?Diagnosis Date  ? Anemia   ? Arthritis   ? Chronic idiopathic gout   ? 06-13-2021  per pt last flare-up  > 2 yrs ago,  right foot  ? CKD (chronic kidney disease), stage II   ? 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 right upper inner- DCIS, 12-16-2013  s/p right breast lumpectomy with two re-excision's,  no chemo/ radiation and completed anti-estrogen therapy 06/ 2020;  oncologist-- dr [name]--  released pt per lov note in epic 05-01-2020  ? Endometrial polyp   ? Essential tremor   ? occasional hand tremor  ? Full dentures   ? GERD (gastroesophageal reflux disease)   ? rarely takes tums  ? Hiatal hernia   ? History of 2019 novel coronavirus disease (COVID-19)   ? 07/ 2020  w/ moderate symptoms that resolved ,  and 2021 w/ mild symptoms;  no hospitalzation for with time but has chronic occasional dry cough since  ? History of temporal artery biopsy   ? Hypertension   ? Hypothyroidism, postsurgical 1970  ? followed by pcp  ? OAB (overactive bladder)   ? Osteopenia   ? PMB (postmenopausal bleeding)   ? Pneumonia   ? Type 2 diabetes mellitus ([place])   ? followed by  pcp   (06-13-2021  per pt only checks once per week, fasting sugar-- 101-120)  ? Wears hearing aid in both ears   ? per pt only aid working is right side, but does not wear all the time  ? ?Past Surgical History:  ?Procedure Laterality Date  ? ARTERY BIOPSY Bilateral 11/07/2021  ? Procedure: BILATERAL TEMPORAL ARTERY BIOPSY;  Surgeon: [name], MD;  Location: [facility];  Service: Vascular;  Laterality: Bilateral;  ? BREAST BIOPSY Right 10/26/2013  ? BREAST LUMPECTOMY WITH NEEDLE LOCALIZATION Right 12/16/2013  ? Procedure: BREAST LUMPECTOMY WITH NEEDLE  LOCALIZATION;  Surgeon: Edward Jolly, MD;  Location: [place];  Service: General;  Laterality: Right;  ? CATARACT EXTRACTION W/ INTRAOCULAR LENS IMPLANT Bilateral 2019  ? CHOLECYSTECTOMY OPEN  1970  ? COLONOSCOPY    ? DILATATION & CURETTAGE/HYSTEROSCOPY WITH MYOSURE N/A 06/18/2021  ? Procedure: DILATATION & CURETTAGE/HYSTEROSCOPY WITH MYOSURE RESECTION FOR POLYPS;  Surgeon: [name], MD;  Location[facility];  Service: Gynecology;  Laterality: N/A;  ? DILATION AND CURETTAGE OF UTERUS  09/2000  ? '@[facility]  by dr [name]  ? FINGER SURGERY    ? right hand-  ? RE-EXCISION OF BREAST LUMPECTOMY Right 01/30/2014  ? Procedure: RE-EXCISION OF BREAST LUMPECTOMY;  Surgeon: Edward Jolly, MD;  Location: [place];  Service: General;  Laterality: Right;  ? RE-EXCISION OF BREAST LUMPECTOMY Right 03/06/2014  ? Procedure: RE-EXCISION OF BREAST LUMPECTOMY;  Surgeon: Edward Jolly, MD;  Location: [place];  Service: General;  Laterality: Right;  ? THYROID LOBECTOMY  1970  ? unilateral  ? TOTAL SHOULDER ARTHROPLASTY Left 03/04/2011  ? [facility]   ? TOTAL SHOULDER ARTHROPLASTY Right 05/21/2016  ? Procedure: RIGHT TOTAL SHOULDER ARTHROPLASTY;  Surgeon: Ninetta Lights, MD;  Location: [place];  Service: Orthopedics;  Laterality: Right;  ? TUBAL LIGATION    ? yrs ago  ? ?Social History: ?  reports that she has never smoked. She has never used smokeless tobacco. She reports that she does not drink alcohol and does not use drugs. ? ?Family History  ?Problem Relation Age of Onset  ? Diabetes Mother   ? Arthritis Father   ? Seizures Sister   ? Colon cancer Sister   ? Pneumonia Daughter   ? Cancer Sister   ?     breast  ? Breast cancer Sister   ? Multiple sclerosis Son   ?     Deceased   ? ? ?Medications: ?Patient's Medications  ?New Prescriptions  ? No medications on file  ?Previous Medications  ? ACETAMINOPHEN (TYLENOL) 650 MG CR TABLET    Take 1,300 mg by  mouth 2 (two) times daily as needed for pain.  ? ALLOPURINOL (ZYLOPRIM) 100 MG TABLET    TAKE 1 TABLET BY MOUTH EVERY DAY FOR GOUT  ? ATORVASTATIN (LIPITOR) 10 MG TABLET    TAKE 1 TABLET(10 MG) BY MOUTH DAILY  ? CALCIUM-VITAMIN D PO    Take 1 tablet by mouth daily.  ? DICLOFENAC SODIUM (VOLTAREN) 1 % GEL    Apply 4 g topically 4 (four) times daily as needed.  ? DULOXETINE (CYMBALTA) 20 MG CAPSULE    TAKE 1 CAPSULE(20 MG) BY MOUTH DAILY  ? GLIPIZIDE (GLUCOTROL XL) 5 MG 24 HR TABLET    TAKE 1 TABLET BY MOUTH EVERY DAY WITH BREAKFAST  ? IBUPROFEN (ADVIL) 400 MG TABLET    Take 0.5 tablets (200 mg total) by  mouth every 6 (six) hours as needed.  ? LEVOTHYROXINE (SYNTHROID) 100 MCG TABLET    TAKE 1 TABLET BY MOUTH EVERY DAY 30 MINUTES BEFORE BREAKFAST ON AN EMPTY STOMACH  ? LOSARTAN (COZAAR) 50 MG TABLET    TAKE 1 TABLET(50 MG) BY MOUTH DAILY  ? METFORMIN (GLUCOPHAGE) 500 MG TABLET    Take 1 tablet (500 mg total) by mouth 2 (two) times daily with a meal.  ? TRIAMCINOLONE CREAM (KENALOG) 0.1 %    APPLY EXTERNALLY TO THE AFFECTED AREA TWICE DAILY  ?Modified Medications  ? No medications on file  ?Discontinued Medications  ? No medications on file  ? ? ?Physical Exam: ? ?Vitals:  ? 02/10/22 1355  ?BP: 126/80  ?Pulse: 97  ?Temp: (!) 97.3 ?F (36.3 ?C)  ?TempSrc: Temporal  ?SpO2: 98%  ?Weight: 255 lb (115.7 kg)  ?Height: '5\' 3"'$  (1.6 m)  ? ?Body mass index is 45.17 kg/m?. ?Wt Readings from Last 3 Encounters:  ?02/10/22 255 lb (115.7 kg)  ?02/03/22 252 lb 12.8 oz (114.7 kg)  ?01/10/22 258 lb 6.4 oz (117.2 kg)  ? ? ?Physical Exam ?Constitutional:   ?   General: She is not in acute distress. ?   Appearance: She is obese. She is not ill-appearing.  ?Cardiovascular:  ?   Rate and Rhythm: Normal rate and regular rhythm.  ?   Pulses: Normal pulses.  ?   Heart sounds: Normal heart sounds.  ?   Comments: 2+ bilateral dorsalis pedis pulses  ?Pulmonary:  ?   Effort: Pulmonary effort is normal.  ?   Breath sounds: Normal breath sounds.   ?Abdominal:  ?   General: Bowel sounds are normal.  ?Musculoskeletal:  ?   Right lower leg: Edema (1+) present.  ?   Left lower leg: Edema (1+) present.  ?Skin: ?   General: Skin is warm and dry.  ?Neurological:

## 2022-02-11 ENCOUNTER — Other Ambulatory Visit: Payer: Self-pay | Admitting: Nurse Practitioner

## 2022-02-11 ENCOUNTER — Other Ambulatory Visit: Payer: Self-pay

## 2022-02-11 DIAGNOSIS — M7989 Other specified soft tissue disorders: Secondary | ICD-10-CM

## 2022-02-11 DIAGNOSIS — E039 Hypothyroidism, unspecified: Secondary | ICD-10-CM

## 2022-02-11 LAB — COMPLETE METABOLIC PANEL WITH GFR
AG Ratio: 1.4 (calc) (ref 1.0–2.5)
ALT: 9 U/L (ref 6–29)
AST: 14 U/L (ref 10–35)
Albumin: 3.7 g/dL (ref 3.6–5.1)
Alkaline phosphatase (APISO): 59 U/L (ref 37–153)
BUN/Creatinine Ratio: 10 (calc) (ref 6–22)
BUN: 10 mg/dL (ref 7–25)
CO2: 24 mmol/L (ref 20–32)
Calcium: 10 mg/dL (ref 8.6–10.4)
Chloride: 109 mmol/L (ref 98–110)
Creat: 0.97 mg/dL — ABNORMAL HIGH (ref 0.60–0.95)
Globulin: 2.7 g/dL (calc) (ref 1.9–3.7)
Glucose, Bld: 143 mg/dL — ABNORMAL HIGH (ref 65–139)
Potassium: 4.2 mmol/L (ref 3.5–5.3)
Sodium: 144 mmol/L (ref 135–146)
Total Bilirubin: 0.4 mg/dL (ref 0.2–1.2)
Total Protein: 6.4 g/dL (ref 6.1–8.1)
eGFR: 58 mL/min/{1.73_m2} — ABNORMAL LOW (ref >=60)

## 2022-02-11 LAB — TSH: TSH: 0.29 mIU/L — ABNORMAL LOW (ref 0.40–4.50)

## 2022-02-11 LAB — HEMOGLOBIN A1C
Hgb A1c MFr Bld: 6.7 % of total Hgb — ABNORMAL HIGH (ref <5.7)
Mean Plasma Glucose: 146 mg/dL
eAG (mmol/L): 8.1 mmol/L

## 2022-02-11 MED ORDER — FUROSEMIDE 20 MG PO TABS: 20.0000 mg | ORAL_TABLET | Freq: Every day | ORAL | 1 refills | Status: DC

## 2022-02-11 NOTE — Telephone Encounter (Signed)
Patient requesting 90 day supply.

## 2022-02-12 ENCOUNTER — Other Ambulatory Visit: Payer: Self-pay

## 2022-02-12 MED ORDER — LEVOTHYROXINE SODIUM 88 MCG PO TABS: 88.0000 ug | ORAL_TABLET | Freq: Every day | ORAL | 1 refills | Status: DC

## 2022-02-27 ENCOUNTER — Other Ambulatory Visit: Payer: Medicare Other

## 2022-02-27 DIAGNOSIS — M7989 Other specified soft tissue disorders: Secondary | ICD-10-CM

## 2022-02-27 DIAGNOSIS — E039 Hypothyroidism, unspecified: Secondary | ICD-10-CM

## 2022-02-28 ENCOUNTER — Ambulatory Visit: Payer: Medicare Other

## 2022-02-28 DIAGNOSIS — R0683 Snoring: Secondary | ICD-10-CM

## 2022-02-28 LAB — BASIC METABOLIC PANEL WITH GFR
BUN: 12 mg/dL (ref 7–25)
CO2: 28 mmol/L (ref 20–32)
Calcium: 10.4 mg/dL (ref 8.6–10.4)
Chloride: 105 mmol/L (ref 98–110)
Creat: 0.91 mg/dL (ref 0.60–0.95)
Glucose, Bld: 108 mg/dL — ABNORMAL HIGH (ref 65–99)
Potassium: 4.2 mmol/L (ref 3.5–5.3)
Sodium: 140 mmol/L (ref 135–146)
eGFR: 62 mL/min/{1.73_m2} (ref >=60)

## 2022-02-28 LAB — TSH: TSH: 0.24 mIU/L — ABNORMAL LOW (ref 0.40–4.50)

## 2022-03-03 ENCOUNTER — Other Ambulatory Visit: Payer: Self-pay

## 2022-03-03 DIAGNOSIS — E039 Hypothyroidism, unspecified: Secondary | ICD-10-CM

## 2022-03-07 ENCOUNTER — Other Ambulatory Visit: Payer: Self-pay | Admitting: Nurse Practitioner

## 2022-03-07 DIAGNOSIS — I10 Essential (primary) hypertension: Secondary | ICD-10-CM

## 2022-03-13 ENCOUNTER — Ambulatory Visit: Payer: Medicare Other

## 2022-03-13 DIAGNOSIS — G4733 Obstructive sleep apnea (adult) (pediatric): Secondary | ICD-10-CM

## 2022-03-20 ENCOUNTER — Telehealth: Payer: Self-pay | Admitting: Pulmonary Disease

## 2022-03-20 DIAGNOSIS — G4733 Obstructive sleep apnea (adult) (pediatric): Secondary | ICD-10-CM | POA: Diagnosis not present

## 2022-03-20 NOTE — Telephone Encounter (Signed)
Call patient ? ?Sleep study result ? ?Date of study: ?03/14/2022 ? ?Impression: ?Mild obstructive sleep apnea ?Moderate oxygen desaturations ? ?Recommendation: ? ?Further evaluation of contributors to nocturnal hypoxemia ? ?Options of treatment for mild obstructive sleep apnea will include ? ?1.  CPAP therapy if there is significant daytime sleepiness or other comorbidities including history of CVA or cardiac disease ? ?-If CPAP is chosen as an option of treatment auto titrating CPAP with a pressure setting of 5-15 will be appropriate ? ?2.  Watchful waiting with emphasis on weight loss measures, sleep position modification to optimize lateral sleep, elevating the head of the bed by about 30 degrees may also help. ? ?3.  An oral device may be fashioned for the treatment of mild sleep disordered breathing, will involve referral to dentist. ? ?Follow-up as previously scheduled ?

## 2022-03-20 NOTE — Telephone Encounter (Signed)
Please set up office visit or video visit to go over study results and treatment plan  ?

## 2022-03-20 NOTE — Telephone Encounter (Signed)
Spoke with the pt and scheduled for ov with TP 03/25/22 at 2:30 pm ?

## 2022-03-25 ENCOUNTER — Ambulatory Visit: Payer: Medicare Other | Admitting: Adult Health

## 2022-03-25 ENCOUNTER — Encounter: Payer: Self-pay | Admitting: Adult Health

## 2022-03-25 DIAGNOSIS — G4733 Obstructive sleep apnea (adult) (pediatric): Secondary | ICD-10-CM | POA: Diagnosis not present

## 2022-03-25 NOTE — Assessment & Plan Note (Signed)
Healthy weight loss 

## 2022-03-25 NOTE — Assessment & Plan Note (Signed)
Mild obstructive sleep apnea with increased symptom burden.  Patient would not be a candidate for oral appliance.  Encouraged on healthy weight loss and healthy sleep regimen.  Patient education on sleep apnea.  We discussed treatment options.  Patient will proceed with CPAP therapy.  We will begin CPAP AutoSet 5 to 15 cm H2O.  She would like to get a small mass.  She says she cannot wear anything really big on her face.  We did looked at several mask options she would like to try the DreamWear nasal mask. ? ?Plan  ?Patient Instructions  ?Begin CPAP At bedtime   ?Wear CPAP all night long for at least 6hr each night  ?Do not drive if sleepy  ?Work on healthy weight loss.  ?You may like the dream wear nasal mask.  ?Follow up in 2-3 months and As needed   ?  ? ?

## 2022-03-25 NOTE — Progress Notes (Signed)
? ?'@Patient'$  ID: Karla Willis, female    DOB: 23-May-1935, 87 y.o.   MRN: [id number] ? ?Chief Complaint  ?Patient presents with  ? Follow-up  ? ? ?Referring provider: ?Lauree Chandler, NP ? ?HPI: ?87 year old female seen for sleep consult for snoring and daytime sleepiness.  Set up for home sleep study that showed mild obstructive sleep apnea ?Medical history of DM and Hypertension  ? ?TEST/EVENTS :  ?Home sleep study Mar 14, 2022 showed mild obstructive sleep apnea AHI 8.7/hour with a SaO2 low at 83% ? ?03/25/2022 Follow up : OSA  ?Patient returns for a follow-up visit.  Patient was seen February 03, 2022 for evaluation of possible sleep apnea.  She had ongoing snoring and daytime sleepiness.  She was set up for home sleep study that was completed on Mar 14, 2022 this showed mild obstructive sleep apnea with AHI at 8.7/hour and an SaO2 low at 83%.  We discussed her sleep study results.  ?We went over treatment options including weight loss oral appliance and CPAP.  Patient has some dental issues and upper dentures.  We did discuss healthy weight loss.  Patient has significant symptom burden with daytime sleepiness restless sleep.  She would like to proceed with CPAP therapy. ? ?Allergies  ?Allergen Reactions  ? Actos [Pioglitazone] Swelling  ? Hydrocodone Nausea Only and Other (See Comments)  ?  "bloated"  ? Tramadol Hcl Itching  ? Codeine Rash  ? Neurontin [Gabapentin] Palpitations and Other (See Comments)  ?  dizziness  ? Other Rash  ?  Nuts--PECANS  ? Peanut-Containing Drug Products Rash  ? ? ?Immunization History  ?Administered Date(s) Administered  ? Fluad Quad(high Dose 65+) 10/08/2020, 12/13/2021  ? Influenza, High Dose Seasonal PF 09/01/2019  ? Influenza,inj,Quad PF,6+ Mos 09/07/2015, 07/25/2016, 09/22/2017  ? Influenza-Unspecified 08/10/2014, 10/04/2018  ? PFIZER(Purple Top)SARS-COV-2 Vaccination 12/17/2019, 01/07/2020, 12/25/2020  ? Pneumococcal Conjugate-13 01/14/2017  ? Pneumococcal-Unspecified 11/10/2013   ? Tdap 04/18/2016  ? Zoster Recombinat (Shingrix) 02/01/2022  ? Zoster, Live 04/18/2012  ? ? ?Past Medical History:  ?Diagnosis Date  ? Anemia   ? Arthritis   ? Chronic idiopathic gout   ? 06-13-2021  per pt last flare-up  > 2 yrs ago,  right foot  ? CKD (chronic kidney disease), stage II   ? 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 right upper inner- DCIS, 12-16-2013  s/p right breast lumpectomy with two re-excision's,  no chemo/ radiation and completed anti-estrogen therapy 06/ 2020;  oncologist-- dr [name]--  released pt per lov note in epic 05-01-2020  ? Endometrial polyp   ? Essential tremor   ? occasional hand tremor  ? Full dentures   ? GERD (gastroesophageal reflux disease)   ? rarely takes tums  ? Hiatal hernia   ? History of 2019 novel coronavirus disease (COVID-19)   ? 07/ 2020  w/ moderate symptoms that resolved ,  and 2021 w/ mild symptoms;  no hospitalzation for with time but has chronic occasional dry cough since  ? History of temporal artery biopsy   ? Hypertension   ? Hypothyroidism, postsurgical 1970  ? followed by pcp  ? OAB (overactive bladder)   ? Osteopenia   ? PMB (postmenopausal bleeding)   ? Pneumonia   ? Type 2 diabetes mellitus ([place])   ? followed by pcp   (06-13-2021  per pt only checks once per week, fasting sugar-- 101-120)  ? Wears hearing aid in both ears   ? per  pt only aid working is right side, but does not wear all the time  ? ? ?Tobacco History: ?Social History  ? ?Tobacco Use  ?Smoking Status Never  ?Smokeless Tobacco Never  ? ?Counseling given: Not Answered ? ? ?Outpatient Medications Prior to Visit  ?Medication Sig Dispense Refill  ? acetaminophen (TYLENOL) 650 MG CR tablet Take 1,300 mg by mouth 2 (two) times daily as needed for pain.    ? allopurinol (ZYLOPRIM) 100 MG tablet TAKE 1 TABLET BY MOUTH EVERY DAY FOR GOUT 90 tablet 1  ? atorvastatin (LIPITOR) 10 MG tablet TAKE 1 TABLET(10 MG) BY MOUTH DAILY 90 tablet 1  ? CALCIUM-VITAMIN D PO Take 1 tablet by  mouth daily.    ? diclofenac sodium (VOLTAREN) 1 % GEL Apply 4 g topically 4 (four) times daily as needed. 100 g 0  ? DULoxetine (CYMBALTA) 20 MG capsule TAKE 1 CAPSULE(20 MG) BY MOUTH DAILY 90 capsule 1  ? furosemide (LASIX) 20 MG tablet Take 1 tablet (20 mg total) by mouth daily. 30 tablet 1  ? glipiZIDE (GLUCOTROL XL) 5 MG 24 hr tablet TAKE 1 TABLET BY MOUTH EVERY DAY WITH BREAKFAST 90 tablet 2  ? ibuprofen (ADVIL) 400 MG tablet Take 0.5 tablets (200 mg total) by mouth every 6 (six) hours as needed. 30 tablet 0  ? levothyroxine (SYNTHROID) 88 MCG tablet Take 1 tablet (88 mcg total) by mouth daily. 90 tablet 1  ? losartan (COZAAR) 50 MG tablet TAKE 1 TABLET(50 MG) BY MOUTH DAILY 90 tablet 1  ? metFORMIN (GLUCOPHAGE) 500 MG tablet Take 1 tablet (500 mg total) by mouth 2 (two) times daily with a meal. 180 tablet 3  ? triamcinolone cream (KENALOG) 0.1 % APPLY EXTERNALLY TO THE AFFECTED AREA TWICE DAILY 45 g 3  ? ?No facility-administered medications prior to visit.  ? ? ? ?Review of Systems:  ? ?Constitutional:   No  weight loss, night sweats,  Fevers, chills,  ?+fatigue, or  lassitude. ? ?HEENT:   No headaches,  Difficulty swallowing,  Tooth/dental problems, or  Sore throat,  ?              No sneezing, itching, ear ache, nasal congestion, post nasal drip,  ? ?CV:  No chest pain,  Orthopnea, PND, anasarca, dizziness, palpitations, syncope.  ? ?GI  No heartburn, indigestion, abdominal pain, nausea, vomiting, diarrhea, change in bowel habits, loss of appetite, bloody stools.  ? ?Resp: No shortness of breath with exertion or at rest.  No excess mucus, no productive cough,  No non-productive cough,  No coughing up of blood.  No change in color of mucus.  No wheezing.  No chest wall deformity ? ?Skin: no rash or lesions. ? ?GU: no dysuria, change in color of urine, no urgency or frequency.  No flank pain, no hematuria  ? ?MS:  No joint pain or swelling.  No decreased range of motion.  No back pain. ? ? ? ?Physical  Exam ? ?BP 140/70 (BP Location: Left Arm, Patient Position: Sitting, Cuff Size: Large)   Pulse 96   Temp 98.1 ?F (36.7 ?C) (Oral)   Ht '5\' 2"'$  (1.575 m)   Wt 253 lb 12.8 oz (115.1 kg)   SpO2 95%   BMI 46.42 kg/m?  ? ?GEN: A/Ox3; pleasant , NAD, well nourished  ?  ?HEENT:  [place]/AT, NOSE-clear, THROAT-clear, no lesions, no postnasal drip or exudate noted.poor dentition , class 3 MP airway  ? ?NECK:  Supple w/ fair ROM; no  JVD; normal carotid impulses w/o bruits; no thyromegaly or nodules palpated; no lymphadenopathy.   ? ?RESP  Clear  P & A; w/o, wheezes/ rales/ or rhonchi. no accessory muscle use, no dullness to percussion ? ?CARD:  RRR, no m/r/g, 1+  peripheral edema, pulses intact, no cyanosis or clubbing. ? ?GI:   Soft & nt; nml bowel sounds; no organomegaly or masses detected.  ? ?Musco: Warm bil, no deformities or joint swelling noted.  ? ?Neuro: alert, no focal deficits noted.   ? ?Skin: Warm, no lesions or rashes ? ? ? ?Lab Results: ? ? ? ?BMET ? ? ?BNP ?No results found for: BNP ? ?ProBNP ?No results found for: PROBNP ? ?Imaging: ?No results found. ? ? ? ?   ? View : No data to display.  ?  ?  ?  ? ? ?No results found for: NITRICOXIDE ? ? ? ? ? ?Assessment & Plan:  ? ?OSA (obstructive sleep apnea) ?Mild obstructive sleep apnea with increased symptom burden.  Patient would not be a candidate for oral appliance.  Encouraged on healthy weight loss and healthy sleep regimen.  Patient education on sleep apnea.  We discussed treatment options.  Patient will proceed with CPAP therapy.  We will begin CPAP AutoSet 5 to 15 cm H2O.  She would like to get a small mass.  She says she cannot wear anything really big on her face.  We did looked at several mask options she would like to try the DreamWear nasal mask. ? ?Plan  ?Patient Instructions  ?Begin CPAP At bedtime   ?Wear CPAP all night long for at least 6hr each night  ?Do not drive if sleepy  ?Work on healthy weight loss.  ?You may like the dream wear nasal mask.   ?Follow up in 2-3 months and As needed   ?  ? ? ?Morbid obesity ([place]) ?Healthy weight loss ? ? ? ? ?Rexene Edison, NP ?03/25/2022 ? ?

## 2022-03-25 NOTE — Patient Instructions (Signed)
Begin CPAP At bedtime   ?Wear CPAP all night long for at least 6hr each night  ?Do not drive if sleepy  ?Work on healthy weight loss.  ?You may like the dream wear nasal mask.  ?Follow up in 2-3 months and As needed   ?

## 2022-04-04 ENCOUNTER — Other Ambulatory Visit: Payer: Self-pay | Admitting: Nurse Practitioner

## 2022-04-04 DIAGNOSIS — M7989 Other specified soft tissue disorders: Secondary | ICD-10-CM

## 2022-04-10 ENCOUNTER — Other Ambulatory Visit: Payer: Self-pay | Admitting: Nurse Practitioner

## 2022-04-10 DIAGNOSIS — E785 Hyperlipidemia, unspecified: Secondary | ICD-10-CM

## 2022-04-14 ENCOUNTER — Other Ambulatory Visit: Payer: Medicare Other

## 2022-04-14 DIAGNOSIS — E039 Hypothyroidism, unspecified: Secondary | ICD-10-CM

## 2022-04-17 ENCOUNTER — Telehealth: Payer: Self-pay | Admitting: Nurse Practitioner

## 2022-04-17 NOTE — Telephone Encounter (Signed)
Pt called to request change her pharmacy to [facility], they will mail prescriptions to pt.

## 2022-04-17 NOTE — Telephone Encounter (Signed)
Pharmacy added.

## 2022-04-21 ENCOUNTER — Other Ambulatory Visit: Payer: Medicare Other

## 2022-04-21 DIAGNOSIS — E039 Hypothyroidism, unspecified: Secondary | ICD-10-CM | POA: Diagnosis not present

## 2022-04-22 LAB — TSH: TSH: 0.78 mIU/L (ref 0.40–4.50)

## 2022-05-05 ENCOUNTER — Other Ambulatory Visit: Payer: Self-pay | Admitting: *Deleted

## 2022-05-05 DIAGNOSIS — E785 Hyperlipidemia, unspecified: Secondary | ICD-10-CM

## 2022-05-05 DIAGNOSIS — I10 Essential (primary) hypertension: Secondary | ICD-10-CM

## 2022-05-05 DIAGNOSIS — E119 Type 2 diabetes mellitus without complications: Secondary | ICD-10-CM

## 2022-05-05 MED ORDER — ATORVASTATIN CALCIUM 10 MG PO TABS: ORAL_TABLET | ORAL | 1 refills | Status: DC

## 2022-05-05 MED ORDER — GLIPIZIDE ER 5 MG PO TB24: ORAL_TABLET | ORAL | 1 refills | Status: DC

## 2022-05-05 MED ORDER — LEVOTHYROXINE SODIUM 88 MCG PO TABS: 88.0000 ug | ORAL_TABLET | Freq: Every day | ORAL | 1 refills | Status: DC

## 2022-05-05 MED ORDER — LOSARTAN POTASSIUM 50 MG PO TABS: ORAL_TABLET | ORAL | 1 refills | Status: DC

## 2022-05-16 ENCOUNTER — Ambulatory Visit (INDEPENDENT_AMBULATORY_CARE_PROVIDER_SITE_OTHER): Payer: Medicare Other | Admitting: Nurse Practitioner

## 2022-05-16 ENCOUNTER — Encounter: Payer: Self-pay | Admitting: Nurse Practitioner

## 2022-05-16 VITALS — BP 126/78 | HR 86 | Temp 98.6°F | Ht 62.0 in | Wt 250.2 lb

## 2022-05-16 DIAGNOSIS — E039 Hypothyroidism, unspecified: Secondary | ICD-10-CM | POA: Diagnosis not present

## 2022-05-16 DIAGNOSIS — G4733 Obstructive sleep apnea (adult) (pediatric): Secondary | ICD-10-CM

## 2022-05-16 DIAGNOSIS — I10 Essential (primary) hypertension: Secondary | ICD-10-CM | POA: Diagnosis not present

## 2022-05-16 DIAGNOSIS — M7989 Other specified soft tissue disorders: Secondary | ICD-10-CM | POA: Diagnosis not present

## 2022-05-16 DIAGNOSIS — E1122 Type 2 diabetes mellitus with diabetic chronic kidney disease: Secondary | ICD-10-CM

## 2022-05-16 DIAGNOSIS — N182 Chronic kidney disease, stage 2 (mild): Secondary | ICD-10-CM

## 2022-05-16 DIAGNOSIS — M1A9XX0 Chronic gout, unspecified, without tophus (tophi): Secondary | ICD-10-CM | POA: Diagnosis not present

## 2022-05-16 NOTE — Patient Instructions (Signed)
Please sign a record release for Dr Katy Fitch, ophthalmology on check out.

## 2022-05-16 NOTE — Progress Notes (Signed)
Careteam: Patient Care Team: Lauree Chandler, NP as PCP - General (Geriatric Medicine) Nicholas Lose, MD as Consulting Physician (Hematology and Oncology) Specialists, [place] as Consulting Physician (Orthopedic Surgery)  PLACE OF SERVICE:  [place]  Advanced Directive information    Allergies  Allergen Reactions   Actos [Pioglitazone] Swelling   Hydrocodone Nausea Only and Other (See Comments)    "bloated"   Tramadol Hcl Itching   Codeine Rash   Neurontin [Gabapentin] Palpitations and Other (See Comments)    dizziness   Other Rash    Nuts--PECANS   Peanut-Containing Drug Products Rash    Chief Complaint  Patient presents with   Medical Management of Chronic Issues    Patient presents today for a 3 month follow-up     HPI: Patient is a 87 y.o. female for routine follow up.   Reports leg swelling has gone down this week.  No changes in diet or medication. She continues on lasix daily   DM- no hypoglycemia- on metformin twice daily with glipizide.   Htn- controlled on lasix and losartan   No recent gout flares- allopurinol 100 mg daily  [place]- was evaluated and was told it was up to her if she wanted to continue the CPAP- she reports she was trying to figure out the cost.   Reports legs hurt, no changes in this.   Review of Systems:  Review of Systems  Constitutional:  Negative for chills, fever and weight loss.  HENT:  Negative for tinnitus.   Respiratory:  Negative for cough, sputum production and shortness of breath.   Cardiovascular:  Negative for chest pain, palpitations and leg swelling.  Gastrointestinal:  Negative for abdominal pain, constipation, diarrhea and heartburn.  Genitourinary:  Negative for dysuria, frequency and urgency.  Musculoskeletal:  Negative for back pain, falls, joint pain and myalgias.  Skin: Negative.   Neurological:  Negative for dizziness and headaches.  Psychiatric/Behavioral:  Negative for depression  and memory loss. The patient does not have insomnia.     Past Medical History:  Diagnosis Date   Anemia    Arthritis    Chronic idiopathic gout    06-13-2021  per pt last flare-up  > 2 yrs ago,  right foot   CKD (chronic kidney disease), stage II    Ductal carcinoma in situ (DCIS) of right breast 10/24/2013   right upper inner- DCIS, 12-16-2013  s/p right breast lumpectomy with two re-excision's,  no chemo/ radiation and completed anti-estrogen therapy 06/ 2020;  oncologist-- dr [name]--  released pt per lov note in epic 05-01-2020   Endometrial polyp    Essential tremor    occasional hand tremor   Full dentures    GERD (gastroesophageal reflux disease)    rarely takes tums   Hiatal hernia    History of 2019 novel coronavirus disease (COVID-19)    07/ 2020  w/ moderate symptoms that resolved ,  and 2021 w/ mild symptoms;  no hospitalzation for with time but has chronic occasional dry cough since   History of temporal artery biopsy    Hypertension    Hypothyroidism, postsurgical 1970   followed by pcp   OAB (overactive bladder)    Osteopenia    PMB (postmenopausal bleeding)    Pneumonia    Type 2 diabetes mellitus ([place])    followed by pcp   (06-13-2021  per pt only checks once per week, fasting sugar-- 101-120)   Wears hearing aid in both ears  per pt only aid working is right side, but does not wear all the time   Past Surgical History:  Procedure Laterality Date   ARTERY BIOPSY Bilateral 11/07/2021   Procedure: BILATERAL TEMPORAL ARTERY BIOPSY;  Surgeon: [name], MD;  [facility];  Service: Vascular;  Laterality: Bilateral;   BREAST BIOPSY Right 10/26/2013   BREAST LUMPECTOMY WITH NEEDLE LOCALIZATION Right 12/16/2013   Procedure: BREAST LUMPECTOMY WITH NEEDLE LOCALIZATION;  Surgeon: Edward Jolly, MD;  Location: [place];  Service: General;  Laterality: Right;   CATARACT EXTRACTION W/ INTRAOCULAR LENS IMPLANT Bilateral 2019    [place] N/A 06/18/2021   Procedure: [place] RESECTION FOR POLYPS;  Surgeon: [name], MD;  Location: [place];  Service: Gynecology;  Laterality: N/A;   DILATION AND CURETTAGE OF UTERUS  09/2000   '@[facility]'  by dr [name]   FINGER SURGERY     right hand-   RE-EXCISION OF BREAST LUMPECTOMY Right 01/30/2014   Procedure: RE-EXCISION OF BREAST LUMPECTOMY;  Surgeon: Edward Jolly, MD;  Location: [place];  Service: General;  Laterality: Right;   RE-EXCISION OF BREAST LUMPECTOMY Right 03/06/2014   Procedure: RE-EXCISION OF BREAST LUMPECTOMY;  Surgeon: Edward Jolly, MD;  Location: [place];  Service: General;  Laterality: Right;   THYROID LOBECTOMY  1970   unilateral   TOTAL SHOULDER ARTHROPLASTY Left 03/04/2011   '@[facility]'    TOTAL SHOULDER ARTHROPLASTY Right 05/21/2016   Procedure: RIGHT TOTAL SHOULDER ARTHROPLASTY;  Surgeon: Ninetta Lights, MD;  Location: [place];  Service: Orthopedics;  Laterality: Right;   TUBAL LIGATION     yrs ago   Social History:   reports that she has never smoked. She has never used smokeless tobacco. She reports that she does not drink alcohol and does not use drugs.  Family History  Problem Relation Age of Onset   Diabetes Mother    Arthritis Father    Seizures Sister    Colon cancer Sister    Pneumonia Daughter    Cancer Sister        breast   Breast cancer Sister    Multiple sclerosis Son        Deceased     Medications: Patient's Medications  New Prescriptions   No medications on file  Previous Medications   ACETAMINOPHEN (TYLENOL) 650 MG CR TABLET    Take 1,300 mg by mouth 2 (two) times daily as needed for pain.   ALLOPURINOL (ZYLOPRIM) 100 MG TABLET    TAKE 1 TABLET BY MOUTH EVERY DAY FOR GOUT   ATORVASTATIN (LIPITOR) 10 MG TABLET    TAKE 1 TABLET(10 MG)  BY MOUTH DAILY   CALCIUM-VITAMIN D PO    Take 1 tablet by mouth daily.   DICLOFENAC SODIUM (VOLTAREN) 1 % GEL    Apply 4 g topically 4 (four) times daily as needed.   DULOXETINE (CYMBALTA) 20 MG CAPSULE    TAKE 1 CAPSULE(20 MG) BY MOUTH DAILY   FUROSEMIDE (LASIX) 20 MG TABLET    TAKE 1 TABLET(20 MG) BY MOUTH DAILY   GLIPIZIDE (GLUCOTROL XL) 5 MG 24 HR TABLET    Take one tablet by mouth once daily with breakfast   IBUPROFEN (ADVIL) 400 MG TABLET    Take 0.5 tablets (200 mg total) by mouth every 6 (six) hours as needed.  LEVOTHYROXINE (SYNTHROID) 88 MCG TABLET    Take 1 tablet (88 mcg total) by mouth daily.   LOSARTAN (COZAAR) 50 MG TABLET    TAKE 1 TABLET(50 MG) BY MOUTH DAILY   METFORMIN (GLUCOPHAGE) 500 MG TABLET    Take 1 tablet (500 mg total) by mouth 2 (two) times daily with a meal.   TRIAMCINOLONE CREAM (KENALOG) 0.1 %    APPLY EXTERNALLY TO THE AFFECTED AREA TWICE DAILY  Modified Medications   No medications on file  Discontinued Medications   No medications on file    Physical Exam:  Vitals:   05/16/22 1442  BP: 126/78  Pulse: 86  Temp: 98.6 F (37 C)  SpO2: 98%  Weight: 250 lb 3.2 oz (113.5 kg)  Height: '5\' 2"'  (1.575 m)   Body mass index is 45.76 kg/m. Wt Readings from Last 3 Encounters:  05/16/22 250 lb 3.2 oz (113.5 kg)  03/25/22 253 lb 12.8 oz (115.1 kg)  02/10/22 255 lb (115.7 kg)    Physical Exam Constitutional:      General: She is not in acute distress.    Appearance: She is well-developed. She is not diaphoretic.  HENT:     Head: Normocephalic and atraumatic.     Mouth/Throat:     Pharynx: No oropharyngeal exudate.  Eyes:     Conjunctiva/sclera: Conjunctivae normal.     Pupils: Pupils are equal, round, and reactive to light.  Cardiovascular:     Rate and Rhythm: Normal rate and regular rhythm.     Heart sounds: Normal heart sounds.  Pulmonary:     Effort: Pulmonary effort is normal.     Breath sounds: Normal breath sounds.  Abdominal:      General: Bowel sounds are normal.     Palpations: Abdomen is soft.  Musculoskeletal:     Cervical back: Normal range of motion and neck supple.     Right lower leg: No edema.     Left lower leg: No edema.  Skin:    General: Skin is warm and dry.  Neurological:     Mental Status: She is alert.  Psychiatric:        Mood and Affect: Mood normal.     Labs reviewed: Basic Metabolic Panel: Recent Labs    10/31/21 1429 02/10/22 1513 02/27/22 1403 04/21/22 1335  NA 136 144 140  --   K 4.3 4.2 4.2  --   CL 101 109 105  --   CO2 '25 24 28  ' --   GLUCOSE 126* 143* 108*  --   BUN 24* 10 12  --   CREATININE 1.38* 0.97* 0.91  --   CALCIUM 10.5* 10.0 10.4  --   TSH  --  0.29* 0.24* 0.78   Liver Function Tests: Recent Labs    02/10/22 1513  AST 14  ALT 9  BILITOT 0.4  PROT 6.4   No results for input(s): "LIPASE", "AMYLASE" in the last 8760 hours. No results for input(s): "AMMONIA" in the last 8760 hours. CBC: Recent Labs    06/18/21 1023 06/28/21 1125 10/11/21 1049  WBC 5.3 6.2 5.2  NEUTROABS  --  3,553 3,110  HGB 12.0 12.4 12.5  HCT 35.4* 39.0 38.6  MCV 86.1 91.5 89.6  PLT 205 222 219   Lipid Panel: Recent Labs    06/28/21 1125  CHOL 124  HDL 60  LDLCALC 47  TRIG 91  CHOLHDL 2.1   TSH: Recent Labs    02/10/22 1513 02/27/22 1403  04/21/22 1335  TSH 0.29* 0.24* 0.78   A1C: Lab Results  Component Value Date   HGBA1C 6.7 (H) 02/10/2022     Assessment/Plan 1. Acquired hypothyroidism Continues on synthroid 88 mcg dialy  - TSH  2. Leg swelling -improved, continues on lasix, low sodium diet and elevation.   3. Type 2 diabetes mellitus with stage 2 chronic kidney disease, without long-term current use of insulin ([place]) -Encouraged dietary compliance, routine foot care/monitoring and to keep up with diabetic eye exams through ophthalmology  -continue current medication regimen. No hypoglycemia noted.  - Hemoglobin A1c - Urine microalbumin-creatinine  with uACR  4. Essential hypertension, benign -Blood pressure well controlled Continue current medications Recheck metabolic panel - CMP with eGFR([facility]) - CBC with Differential/Platelet  5. Chronic gout without tophus, unspecified cause, unspecified site -no recent flare, continues on allopurinol 100 mg daily  - Uric Acid  6. OSA (obstructive sleep apnea) -followed with pulmonary in May was given instructions for CPAP, number given for follow up for CPAP  7. Morbid obesity ([place]) -education provided on healthy weight loss through increase in physical activity and proper nutrition     Return in about 4 months (around 09/16/2022) for routine follow up . Carlos American. [place], [facility] [phone]

## 2022-05-17 LAB — COMPLETE METABOLIC PANEL WITH GFR
AG Ratio: 1.4 (calc) (ref 1.0–2.5)
ALT: 9 U/L (ref 6–29)
AST: 15 U/L (ref 10–35)
Albumin: 3.7 g/dL (ref 3.6–5.1)
Alkaline phosphatase (APISO): 61 U/L (ref 37–153)
BUN/Creatinine Ratio: 14 (calc) (ref 6–22)
BUN: 16 mg/dL (ref 7–25)
CO2: 25 mmol/L (ref 20–32)
Calcium: 10 mg/dL (ref 8.6–10.4)
Chloride: 106 mmol/L (ref 98–110)
Creat: 1.16 mg/dL — ABNORMAL HIGH (ref 0.60–0.95)
Globulin: 2.7 g/dL (calc) (ref 1.9–3.7)
Glucose, Bld: 90 mg/dL (ref 65–99)
Potassium: 4.4 mmol/L (ref 3.5–5.3)
Sodium: 141 mmol/L (ref 135–146)
Total Bilirubin: 0.4 mg/dL (ref 0.2–1.2)
Total Protein: 6.4 g/dL (ref 6.1–8.1)
eGFR: 46 mL/min/{1.73_m2} — ABNORMAL LOW (ref >=60)

## 2022-05-17 LAB — CBC WITH DIFFERENTIAL/PLATELET
Absolute Monocytes: 645 cells/uL (ref 200–950)
Basophils Absolute: 31 cells/uL (ref 0–200)
Basophils Relative: 0.5 %
Eosinophils Absolute: 353 cells/uL (ref 15–500)
Eosinophils Relative: 5.7 %
HCT: 36.9 % (ref 35.0–45.0)
Hemoglobin: 12.2 g/dL (ref 11.7–15.5)
Lymphs Abs: 1891 cells/uL (ref 850–3900)
MCH: 28.8 pg (ref 27.0–33.0)
MCHC: 33.1 g/dL (ref 32.0–36.0)
MCV: 87.2 fL (ref 80.0–100.0)
MPV: 11.1 fL (ref 7.5–12.5)
Monocytes Relative: 10.4 %
Neutro Abs: 3280 cells/uL (ref 1500–7800)
Neutrophils Relative %: 52.9 %
Platelets: 247 10*3/uL (ref 140–400)
RBC: 4.23 10*6/uL (ref 3.80–5.10)
RDW: 14.2 % (ref 11.0–15.0)
Total Lymphocyte: 30.5 %
WBC: 6.2 10*3/uL (ref 3.8–10.8)

## 2022-05-17 LAB — HEMOGLOBIN A1C
Hgb A1c MFr Bld: 6.4 % of total Hgb — ABNORMAL HIGH (ref <5.7)
Mean Plasma Glucose: 137 mg/dL
eAG (mmol/L): 7.6 mmol/L

## 2022-05-17 LAB — TSH: TSH: 0.47 mIU/L (ref 0.40–4.50)

## 2022-05-17 LAB — MICROALBUMIN / CREATININE URINE RATIO
Creatinine, Urine: 47 mg/dL (ref 20–275)
Microalb, Ur: <0.2 mg/dL

## 2022-05-17 LAB — URIC ACID: Uric Acid, Serum: 5.4 mg/dL (ref 2.5–7.0)

## 2022-06-03 ENCOUNTER — Other Ambulatory Visit: Payer: Self-pay | Admitting: Nurse Practitioner

## 2022-06-03 DIAGNOSIS — G8929 Other chronic pain: Secondary | ICD-10-CM

## 2022-06-17 ENCOUNTER — Other Ambulatory Visit: Payer: Self-pay | Admitting: Nurse Practitioner

## 2022-06-23 ENCOUNTER — Other Ambulatory Visit: Payer: Self-pay | Admitting: *Deleted

## 2022-06-23 MED ORDER — ALLOPURINOL 100 MG PO TABS: ORAL_TABLET | ORAL | 1 refills | Status: DC

## 2022-06-23 NOTE — Telephone Encounter (Signed)
Patient called requesting rx to go to [facility] not [facility].   Rx faxed as requested.

## 2022-06-26 ENCOUNTER — Encounter: Payer: Medicare Other | Admitting: Adult Health

## 2022-06-26 ENCOUNTER — Other Ambulatory Visit: Payer: Self-pay | Admitting: *Deleted

## 2022-06-26 DIAGNOSIS — G4733 Obstructive sleep apnea (adult) (pediatric): Secondary | ICD-10-CM

## 2022-06-26 NOTE — Addendum Note (Signed)
Addended by: Vanessa Barbara on: 06/26/2022 02:15 PM   Modules accepted: Orders

## 2022-06-26 NOTE — Progress Notes (Signed)
Error

## 2022-07-03 ENCOUNTER — Telehealth: Payer: Self-pay

## 2022-07-03 DIAGNOSIS — M7989 Other specified soft tissue disorders: Secondary | ICD-10-CM

## 2022-07-03 MED ORDER — FUROSEMIDE 20 MG PO TABS: ORAL_TABLET | ORAL | 1 refills | Status: DC

## 2022-07-03 NOTE — Telephone Encounter (Signed)
Patient would like medication [facility].

## 2022-07-09 DIAGNOSIS — G4733 Obstructive sleep apnea (adult) (pediatric): Secondary | ICD-10-CM | POA: Diagnosis not present

## 2022-07-16 ENCOUNTER — Ambulatory Visit
Admission: RE | Admit: 2022-07-16 | Discharge: 2022-07-16 | Disposition: A | Discharge status: Home / Self Care | Payer: Medicare Other | Source: Ambulatory Visit | Attending: Nurse Practitioner | Admitting: Nurse Practitioner

## 2022-07-16 DIAGNOSIS — Z780 Asymptomatic menopausal state: Secondary | ICD-10-CM | POA: Diagnosis not present

## 2022-07-16 DIAGNOSIS — E2839 Other primary ovarian failure: Secondary | ICD-10-CM

## 2022-07-16 DIAGNOSIS — M810 Age-related osteoporosis without current pathological fracture: Secondary | ICD-10-CM | POA: Diagnosis not present

## 2022-07-18 ENCOUNTER — Encounter: Payer: Self-pay | Admitting: Nurse Practitioner

## 2022-07-18 ENCOUNTER — Encounter: Payer: Self-pay | Admitting: *Deleted

## 2022-07-18 ENCOUNTER — Ambulatory Visit (INDEPENDENT_AMBULATORY_CARE_PROVIDER_SITE_OTHER): Payer: Medicare Other | Admitting: Nurse Practitioner

## 2022-07-18 VITALS — BP 134/76 | HR 84 | Temp 96.6°F | Ht 62.0 in | Wt 249.6 lb

## 2022-07-18 DIAGNOSIS — M5442 Lumbago with sciatica, left side: Secondary | ICD-10-CM

## 2022-07-18 DIAGNOSIS — M810 Age-related osteoporosis without current pathological fracture: Secondary | ICD-10-CM

## 2022-07-18 DIAGNOSIS — K219 Gastro-esophageal reflux disease without esophagitis: Secondary | ICD-10-CM | POA: Diagnosis not present

## 2022-07-18 DIAGNOSIS — G8929 Other chronic pain: Secondary | ICD-10-CM

## 2022-07-18 DIAGNOSIS — Z66 Do not resuscitate: Secondary | ICD-10-CM | POA: Diagnosis not present

## 2022-07-18 MED ORDER — NYSTATIN 100000 UNIT/GM EX CREA: 1.0000 | TOPICAL_CREAM | Freq: Two times a day (BID) | CUTANEOUS | 0 refills | Status: DC

## 2022-07-18 NOTE — Progress Notes (Signed)
Careteam: Patient Care Team: Karla Chandler, NP as PCP - General (Geriatric Medicine) Nicholas Lose, MD as Consulting Physician (Hematology and Oncology) Specialists, [place] as Consulting Physician (Orthopedic Surgery)  PLACE OF SERVICE:  [place] Directive information Does Patient Have a Medical Advance Directive?: Yes, Type of Advance Directive: Out of facility DNR (pink MOST or yellow form), Pre-existing out of facility DNR order (yellow form or pink MOST form): Yellow form placed in chart (order not valid for inpatient use);Pink Most/Yellow Form available - Physician notified to receive inpatient order, Does patient want to make changes to medical advance directive?: No - Patient declined  Allergies  Allergen Reactions   Actos [Pioglitazone] Swelling   Hydrocodone Nausea Only and Other (See Comments)    "bloated"   Tramadol Hcl Itching   Codeine Rash   Neurontin [Gabapentin] Palpitations and Other (See Comments)    dizziness   Other Rash    Nuts--PECANS   Peanut-Containing Drug Products Rash    Chief Complaint  Patient presents with   Results    Discuss Bone Density results. Patient complains of pain in her left hip.     HPI: Patient is a 87 y.o. female to discuss bone density results.  Has a lot of pain when she is doing physical activity and limited. Pain mostly in low back and down leg. Has a hard time getting up.  Had this pain for a long time but getting worse. Has had injection her in back in the past. Unsure when this was. She was seeing [name], neurosurgery   She has some GERD, manages with lifestyle modifications.   Continues on cal and vit d   Review of Systems:  Review of Systems  Constitutional:  Negative for chills, fever and weight loss.  Musculoskeletal:  Positive for back pain and myalgias.    Past Medical History:  Diagnosis Date   Anemia    Arthritis    Chronic idiopathic gout    06-13-2021  per pt  last flare-up  > 2 yrs ago,  right foot   CKD (chronic kidney disease), stage II    Ductal carcinoma in situ (DCIS) of right breast 10/24/2013   right upper inner- DCIS, 12-16-2013  s/p right breast lumpectomy with two re-excision's,  no chemo/ radiation and completed anti-estrogen therapy 06/ 2020;  oncologist-- dr [name]--  released pt per lov note in epic 05-01-2020   Endometrial polyp    Essential tremor    occasional hand tremor   Full dentures    GERD (gastroesophageal reflux disease)    rarely takes tums   Hiatal hernia    History of 2019 novel coronavirus disease (COVID-19)    07/ 2020  w/ moderate symptoms that resolved ,  and 2021 w/ mild symptoms;  no hospitalzation for with time but has chronic occasional dry cough since   History of temporal artery biopsy    Hypertension    Hypothyroidism, postsurgical 1970   followed by pcp   OAB (overactive bladder)    Osteopenia    PMB (postmenopausal bleeding)    Pneumonia    Type 2 diabetes mellitus ([place])    followed by pcp   (06-13-2021  per pt only checks once per week, fasting sugar-- 101-120)   Wears hearing aid in both ears    per pt only aid working is right side, but does not wear all the time   Past Surgical History:  Procedure Laterality Date  ARTERY BIOPSY Bilateral 11/07/2021   Procedure: BILATERAL TEMPORAL ARTERY BIOPSY;  Surgeon: [name], MD;  Location: [facility] OR;  Service: Vascular;  Laterality: Bilateral;   BREAST BIOPSY Right 10/26/2013   BREAST LUMPECTOMY WITH NEEDLE LOCALIZATION Right 12/16/2013   Procedure: BREAST LUMPECTOMY WITH NEEDLE LOCALIZATION;  Surgeon: Edward Jolly, MD;  Location: [place];  Service: General;  Laterality: Right;   CATARACT EXTRACTION W/ INTRAOCULAR LENS IMPLANT Bilateral 2019   [place] N/A 06/18/2021   Procedure: [place] RESECTION  FOR POLYPS;  Surgeon: [name], MD;  Location: [place];  Service: Gynecology;  Laterality: N/A;   DILATION AND CURETTAGE OF UTERUS  09/2000   '@[facility]'$  by dr [name]   FINGER SURGERY     right hand-   RE-EXCISION OF BREAST LUMPECTOMY Right 01/30/2014   Procedure: RE-EXCISION OF BREAST LUMPECTOMY;  Surgeon: Edward Jolly, MD;  Location: [place];  Service: General;  Laterality: Right;   RE-EXCISION OF BREAST LUMPECTOMY Right 03/06/2014   Procedure: RE-EXCISION OF BREAST LUMPECTOMY;  Surgeon: Edward Jolly, MD;  Location: [place];  Service: General;  Laterality: Right;   THYROID LOBECTOMY  1970   unilateral   TOTAL SHOULDER ARTHROPLASTY Left 03/04/2011   '@[facility]'$    TOTAL SHOULDER ARTHROPLASTY Right 05/21/2016   Procedure: RIGHT TOTAL SHOULDER ARTHROPLASTY;  Surgeon: Ninetta Lights, MD;  Location: [place];  Service: Orthopedics;  Laterality: Right;   TUBAL LIGATION     yrs ago   Social History:   reports that she has never smoked. She has never used smokeless tobacco. She reports that she does not drink alcohol and does not use drugs.  Family History  Problem Relation Age of Onset   Diabetes Mother    Arthritis Father    Seizures Sister    Colon cancer Sister    Pneumonia Daughter    Cancer Sister        breast   Breast cancer Sister    Multiple sclerosis Son        Deceased     Medications: Patient's Medications  New Prescriptions   No medications on file  Previous Medications   ACETAMINOPHEN (TYLENOL) 650 MG CR TABLET    Take 1,300 mg by mouth 2 (two) times daily as needed for pain.   ALLOPURINOL (ZYLOPRIM) 100 MG TABLET    Take one tablet by mouth once daily for gout   ATORVASTATIN (LIPITOR) 10 MG TABLET    TAKE 1 TABLET(10 MG) BY MOUTH DAILY   CALCIUM-VITAMIN D PO    Take 1 tablet by mouth daily.   DICLOFENAC SODIUM (VOLTAREN) 1 % GEL    Apply 4 g topically 4 (four) times daily as needed.    DULOXETINE (CYMBALTA) 20 MG CAPSULE    TAKE 1 CAPSULE(20 MG) BY MOUTH DAILY   FUROSEMIDE (LASIX) 20 MG TABLET    TAKE 1 TABLET(20 MG) BY MOUTH DAILY   GLIPIZIDE (GLUCOTROL XL) 5 MG 24 HR TABLET    Take one tablet by mouth once daily with breakfast   LEVOTHYROXINE (SYNTHROID) 88 MCG TABLET    Take 1 tablet (88 mcg total) by mouth daily.   LOSARTAN (COZAAR) 50 MG TABLET    TAKE 1 TABLET(50 MG) BY MOUTH DAILY   METFORMIN (GLUCOPHAGE) 500 MG TABLET    Take 1 tablet (500 mg total)  by mouth 2 (two) times daily with a meal.   TRIAMCINOLONE CREAM (KENALOG) 0.1 %    APPLY EXTERNALLY TO THE AFFECTED AREA TWICE DAILY  Modified Medications   No medications on file  Discontinued Medications   No medications on file    Physical Exam:  Vitals:   07/18/22 1514  BP: 134/76  Pulse: 84  Temp: (!) 96.6 F (35.9 C)  SpO2: 95%  Weight: 249 lb 9.6 oz (113.2 kg)  Height: '5\' 2"'$  (1.575 m)   Body mass index is 45.65 kg/m. Wt Readings from Last 3 Encounters:  07/18/22 249 lb 9.6 oz (113.2 kg)  05/16/22 250 lb 3.2 oz (113.5 kg)  03/25/22 253 lb 12.8 oz (115.1 kg)    Physical Exam Constitutional:      General: She is not in acute distress.    Appearance: She is well-developed. She is not diaphoretic.  HENT:     Mouth/Throat:     Pharynx: No oropharyngeal exudate.  Cardiovascular:     Rate and Rhythm: Normal rate and regular rhythm.     Heart sounds: Normal heart sounds.  Pulmonary:     Effort: Pulmonary effort is normal.     Breath sounds: Normal breath sounds.  Musculoskeletal:     Cervical back: Normal range of motion and neck supple.       Back:     Right lower leg: No edema.     Left lower leg: No edema.  Skin:    General: Skin is warm and dry.  Neurological:     Mental Status: She is alert.  Psychiatric:        Mood and Affect: Mood normal.     Labs reviewed: Basic Metabolic Panel: Recent Labs    02/10/22 1513 02/27/22 1403 04/21/22 1335 05/16/22 1513  NA 144 140  --  141   K 4.2 4.2  --  4.4  CL 109 105  --  106  CO2 24 28  --  25  GLUCOSE 143* 108*  --  90  BUN 10 12  --  16  CREATININE 0.97* 0.91  --  1.16*  CALCIUM 10.0 10.4  --  10.0  TSH 0.29* 0.24* 0.78 0.47   Liver Function Tests: Recent Labs    02/10/22 1513 05/16/22 1513  AST 14 15  ALT 9 9  BILITOT 0.4 0.4  PROT 6.4 6.4   No results for input(s): "LIPASE", "AMYLASE" in the last 8760 hours. No results for input(s): "AMMONIA" in the last 8760 hours. CBC: Recent Labs    10/11/21 1049 05/16/22 1513  WBC 5.2 6.2  NEUTROABS 3,110 3,280  HGB 12.5 12.2  HCT 38.6 36.9  MCV 89.6 87.2  PLT 219 247   Lipid Panel: No results for input(s): "CHOL", "HDL", "LDLCALC", "TRIG", "CHOLHDL", "LDLDIRECT" in the last 8760 hours. TSH: Recent Labs    02/27/22 1403 04/21/22 1335 05/16/22 1513  TSH 0.24* 0.78 0.47   A1C: Lab Results  Component Value Date   HGBA1C 6.4 (H) 05/16/2022     Assessment/Plan 1. Do not resuscitate - Do not attempt resuscitation (DNR)  2. Chronic left-sided low back pain with left-sided sciatica -ongoing pain and has progressed, saw neurosurgery in the past - Ambulatory referral to Physical Therapy for further evaluation and treatment to help mobility and pain.  3. Osteoporosis without current pathological fracture, unspecified osteoporosis type -noted on recent bone density.  -Recommended to take calcium 600 mg twice daily with Vitamin D 2000 units daily and  weight bearing activity 30 mins/5 days a week -she is interested in starting prolia, will get prior auth. She does have some GERD so fosamax may exacerbate these symptoms.    4. Gastroesophageal reflux disease without esophagitis Controlled with lifestyle modifications.    09/19/2022 for routine follow up  [place]. [place], [facility] [phone]

## 2022-07-18 NOTE — Telephone Encounter (Signed)
error 

## 2022-07-24 ENCOUNTER — Telehealth: Payer: Self-pay | Admitting: *Deleted

## 2022-07-24 NOTE — Telephone Encounter (Signed)
Received request for Authorization for Prolia.  Added information to [employer] for Verification.  Awaiting Determination.

## 2022-07-30 ENCOUNTER — Encounter: Payer: Self-pay | Admitting: Physical Therapy

## 2022-07-30 ENCOUNTER — Other Ambulatory Visit: Payer: Self-pay

## 2022-07-30 ENCOUNTER — Ambulatory Visit: Payer: Medicare Other | Attending: Nurse Practitioner | Admitting: Physical Therapy

## 2022-07-30 DIAGNOSIS — R2681 Unsteadiness on feet: Secondary | ICD-10-CM | POA: Insufficient documentation

## 2022-07-30 DIAGNOSIS — G8929 Other chronic pain: Secondary | ICD-10-CM | POA: Diagnosis not present

## 2022-07-30 DIAGNOSIS — M6281 Muscle weakness (generalized): Secondary | ICD-10-CM | POA: Diagnosis not present

## 2022-07-30 DIAGNOSIS — M5442 Lumbago with sciatica, left side: Secondary | ICD-10-CM | POA: Insufficient documentation

## 2022-07-30 DIAGNOSIS — M545 Low back pain, unspecified: Secondary | ICD-10-CM | POA: Diagnosis not present

## 2022-07-30 NOTE — Therapy (Signed)
OUTPATIENT PHYSICAL THERAPY THORACOLUMBAR EVALUATION   Patient Name: Karla Willis MRN: [id number] DOB:1935-06-01, 87 y.o., female Today's Date: 07/30/2022   PT End of Session - 07/30/22 1524     Visit Number 1    Number of Visits --   1-2x/week   Date for PT Re-Evaluation 09/24/22    Authorization Type U[employer] MCR    Progress Note Due on Visit 10    PT Start Time 1400    PT Stop Time 2297    PT Time Calculation (min) 32 min             Past Medical History:  Diagnosis Date   Anemia    Arthritis    Chronic idiopathic gout    06-13-2021  per pt last flare-up  > 2 yrs ago,  right foot   CKD (chronic kidney disease), stage II    Ductal carcinoma in situ (DCIS) of right breast 10/24/2013   right upper inner- DCIS, 12-16-2013  s/p right breast lumpectomy with two re-excision's,  no chemo/ radiation and completed anti-estrogen therapy 06/ 2020;  oncologist-- dr [name]--  released pt per lov note in epic 05-01-2020   Endometrial polyp    Essential tremor    occasional hand tremor   Full dentures    GERD (gastroesophageal reflux disease)    rarely takes tums   Hiatal hernia    History of 2019 novel coronavirus disease (COVID-19)    07/ 2020  w/ moderate symptoms that resolved ,  and 2021 w/ mild symptoms;  no hospitalzation for with time but has chronic occasional dry cough since   History of temporal artery biopsy    Hypertension    Hypothyroidism, postsurgical 1970   followed by pcp   OAB (overactive bladder)    Osteopenia    PMB (postmenopausal bleeding)    Pneumonia    Type 2 diabetes mellitus ([place])    followed by pcp   (06-13-2021  per pt only checks once per week, fasting sugar-- 101-120)   Wears hearing aid in both ears    per pt only aid working is right side, but does not wear all the time   Past Surgical History:  Procedure Laterality Date   ARTERY BIOPSY Bilateral 11/07/2021   Procedure: BILATERAL TEMPORAL ARTERY BIOPSY;  Surgeon: [name], MD;   Location: [place];  Service: Vascular;  Laterality: Bilateral;   BREAST BIOPSY Right 10/26/2013   BREAST LUMPECTOMY WITH NEEDLE LOCALIZATION Right 12/16/2013   Procedure: BREAST LUMPECTOMY WITH NEEDLE LOCALIZATION;  Surgeon: Edward Jolly, MD;  Location: [place];  Service: General;  Laterality: Right;   CATARACT EXTRACTION W/ INTRAOCULAR LENS IMPLANT Bilateral 2019   CHOLECYSTECTOMY OPEN  1970   COLONOSCOPY     DILATATION & CURETTAGE/HYSTEROSCOPY WITH MYOSURE N/A 06/18/2021   Procedure: DILATATION & CURETTAGE/HYSTEROSCOPY WITH MYOSURE RESECTION FOR POLYPS;  Surgeon: [name], MD;  Location: [place];  Service: Gynecology;  Laterality: N/A;   DILATION AND CURETTAGE OF UTERUS  09/2000   '@[facility]'$  by dr [name]   FINGER SURGERY     right hand-   RE-EXCISION OF BREAST LUMPECTOMY Right 01/30/2014   Procedure: RE-EXCISION OF BREAST LUMPECTOMY;  Surgeon: Edward Jolly, MD;  Location: [place];  Service: General;  Laterality: Right;   RE-EXCISION OF BREAST LUMPECTOMY Right 03/06/2014   Procedure: RE-EXCISION OF BREAST LUMPECTOMY;  Surgeon: Edward Jolly, MD;  Location: [place];  Service: General;  Laterality: Right;   THYROID LOBECTOMY  1970   unilateral   TOTAL SHOULDER ARTHROPLASTY Left 03/04/2011   '@[facility]'$    TOTAL SHOULDER ARTHROPLASTY Right 05/21/2016   Procedure: RIGHT TOTAL SHOULDER ARTHROPLASTY;  Surgeon: Ninetta Lights, MD;  Location: [place];  Service: Orthopedics;  Laterality: Right;   TUBAL LIGATION     yrs ago   Patient Active Problem List   Diagnosis Date Noted   OSA (obstructive sleep apnea) 03/25/2022   Daytime sleepiness 02/03/2022   Morbid obesity ([place]) 02/03/2022   Snoring 02/14/2020   Insomnia 02/14/2020   History of breast cancer 05/20/2017   Idiopathic gout 05/20/2017   Hyperlipidemia LDL goal <100 05/20/2017   Bilateral edema of lower extremity 05/20/2017   Dyspnea on  exertion 05/25/2016   Anemia 05/25/2016   Localized primary osteoarthritis of right shoulder region 05/21/2016   Osteoarthritis, multiple sites 03/09/2015   Hypothyroidism 03/09/2015   Goiter 03/09/2015   Essential hypertension, benign 03/09/2015   Diabetes mellitus, type 2 ([place]) 03/09/2015   DCIS (ductal carcinoma in situ) of breast 11/11/2013    PCP: Lauree Chandler, NP  REFERRING PROVIDER: Lauree Chandler, NP  THERAPY DIAG:  Low back pain, unspecified back pain laterality, unspecified chronicity, unspecified whether sciatica present - Plan: PT plan of care cert/re-cert  Muscle weakness - Plan: PT plan of care cert/re-cert  Unsteadiness on feet - Plan: PT plan of care cert/re-cert  REFERRING DIAG: Chronic left-sided low back pain with left-sided sciatica [M54.42, G89.29]  Rationale for Evaluation and Treatment Rehabilitation  SUBJECTIVE:  PERTINENT PAST HISTORY:  DM TII, Dyspnea on exertion, anemia, osteoporosis        PRECAUTIONS: None  WEIGHT BEARING RESTRICTIONS No  FALLS:  Has patient fallen in last 6 months? No, Number of falls: 0  MOI/History of condition:  Onset date: > 5 years  Karla Willis is a 87 y.o. female who presents to clinic with chief complaint of midline low back pain with radiating pain down L LE.  She denies trauma.  She feels generally stable on feet.  Denies N/T.  Denies BB changes or saddle anesthesia.  Lumbar Spinal Stenosis Cluster Bilateral Symptoms   positive Leg pain > back pain   negative Pain during walking/standing  positive Pain relief w/ sitting   positive >85 y/o     positive    Red flags:  denies   Pain:  Are you having pain? Yes Pain location: midline low back pain with radiating pain posterior L LE NPRS scale:  current 2/10  average 5/10  Aggravating factors: bending, cleaning, sitting for long periods  NPRS, highest: 8/10 Relieving factors: lying down  NPRS: best: 3/10 Pain description: intermittent,  dull, and aching Stage: Chronic Stability: staying the same 24 hour pattern: stiff and worse in the morning   Occupation: NA  Assistive Device: SPC  Hand Dominance: NA  Patient Goals/Specific Activities: reduce pain, improve balance   OBJECTIVE:   GENERAL OBSERVATION/GAIT:  Slow antalgic gait, using SPC  SENSATION:  Light touch: Appears intact  MUSCLE LENGTH: Hamstrings: Right no restriction; Left no restriction ASLR: Right ASLR significantly reduced compared to PSLR ; Left ASLR significantly reduced compared to PSLR  Thomas test: Right significant restriction; Left significant restriction     LUMBAR AROM  AROM AROM  07/30/2022  Flexion WNL  Extension limited by 75%  Right lateral flexion limited by 50%  Left lateral flexion limited by 50%  Right rotation limited by 25%  Left rotation limited by  25%    (Blank rows = not tested)  LE MMT:  MMT Right 07/30/2022 Left 07/30/2022  Hip flexion (L2, L3) 3 3  Knee extension (L3) 3+ 3+  Knee flexion 3+ 3+  Hip abduction 2+ 2+  Hip extension NT NT  Hip external rotation    Hip internal rotation    Hip adduction    Ankle dorsiflexion (L4)    Ankle plantarflexion (S1)    Ankle inversion    Ankle eversion    Great Toe ext (L5)    Grossly     (Blank rows = not tested, score listed is out of 5 possible points.  N = WNL, D = diminished, C = clear for gross weakness with myotome testing, * = concordant pain with testing)   LE ROM:  ROM Right 07/30/2022 Left 07/30/2022  Hip flexion    Hip extension    Hip abduction    Hip adduction    Hip internal rotation    Hip external rotation    Knee flexion    Knee extension    Ankle dorsiflexion    Ankle plantarflexion    Ankle inversion    Ankle eversion      (Blank rows = not tested, N = WNL, * = concordant pain with testing)  Functional Tests  Eval (07/30/2022)    30'' STS: 6x  UE used? Y    Progressive balance screen (highest level completed for >/=  10''):  Feet together: 10'' Semi Tandem: R in rear 5'', L in rear 6''                                                        LUMBAR SPECIAL TESTS:  Straight leg raise: L (-), R (-) Slump: L (-), R (-)  PATIENT SURVEYS:  FOTO 52 -> 59   TODAY'S TREATMENT  Creating, reviewing, and completing below HEP  PATIENT EDUCATION:  POC, diagnosis, prognosis, HEP, and outcome measures.  Pt educated via explanation, demonstration, and handout (HEP).  Pt confirms understanding verbally.   HOME EXERCISE PROGRAM: Access Code: [email or web address] URL: [email or web address] Date: 07/30/2022 Prepared by: Shearon Balo  Exercises - Supine Active Straight Leg Raise  - 1 x daily - 7 x weekly - 3 sets - 10 reps - Hooklying Isometric Clamshell  - 1 x daily - 7 x weekly - 3 sets - 10 reps - Supine Hip Adduction Isometric with Ball  - 1 x daily - 7 x weekly - 2 sets - 10 reps - 10'' hold  ASTERISK SIGNS   Asterisk Signs Eval (07/30/2022)       ASLR=PSLR no       Pain max 8/10       30'' STS 6x w/ UE support       Hip ext Sig limitation                 ASSESSMENT:  CLINICAL IMPRESSION: Karla Willis is a 87 y.o. female who presents to clinic with signs and sxs consistent with mechanical low back pain with likely nerve root irritation.  Neural tension exams were negative on exam today, but given subjective report and forward flexed trunk position some element of stenosis is likely.      OBJECTIVE IMPAIRMENTS: Pain, hip and core strength, balance  ACTIVITY LIMITATIONS: bending, lifting, squatting  PERSONAL FACTORS: See medical history and pertinent history   REHAB POTENTIAL: Fair chronic  CLINICAL DECISION MAKING: Stable/uncomplicated  EVALUATION COMPLEXITY: Low   GOALS:   SHORT TERM GOALS: Target date: 08/20/2022  Karla Willis will be >75% HEP compliant to improve carryover between sessions and facilitate independent management of condition  Evaluation (07/30/2022):  ongoing Goal status: INITIAL   LONG TERM GOALS: Target date: 09/24/2022  Karla Willis will improve FOTO score to 59 as a proxy for functional improvement  Evaluation/Baseline (07/30/2022): 52 Goal status: INITIAL   2.  Karla Willis will improve 79'' STS (MCID 2) to >/= 8x (w/ UE?: Y) to show improved LE strength and improved transfers   Evaluation/Baseline (07/30/2022): 6x  w/ UE? Y Goal status: INITIAL   3.  Karla Willis will be able to stand for >20'' in semi tandem stance, to show a significant improvement in balance in order to reduce fall risk   Evaluation/Baseline (07/30/2022):  Progressive balance screen (highest level completed for >/= 10''):  Feet together: 10'' Semi Tandem: R in rear 5'', L in rear 6'' Goal status: INITIAL   4.  Karla Willis will self report >/= 50% decrease in pain from evaluation   Evaluation/Baseline (07/30/2022): 8/10 max pain Goal status: INITIAL    PLAN: PT FREQUENCY: 1-2x/week  PT DURATION: 8 weeks (Ending 09/24/2022)  PLANNED INTERVENTIONS: Therapeutic exercises, Aquatic therapy, Therapeutic activity, Neuro Muscular re-education, Gait training, Patient/Family education, Joint mobilization, Dry Needling, Electrical stimulation, Spinal mobilization and/or manipulation, Moist heat, Taping, Vasopneumatic device, Ionotophoresis '4mg'$ /ml Dexamethasone, and Manual therapy  PLAN FOR NEXT SESSION: progressive hip/core/LE strengthening, balance, gait   Shearon Balo PT, DPT 07/30/2022, 3:26 PM

## 2022-08-07 ENCOUNTER — Ambulatory Visit: Payer: Medicare Other | Admitting: Physical Therapy

## 2022-08-07 ENCOUNTER — Encounter: Payer: Self-pay | Admitting: Physical Therapy

## 2022-08-07 DIAGNOSIS — M545 Low back pain, unspecified: Secondary | ICD-10-CM

## 2022-08-07 DIAGNOSIS — M6281 Muscle weakness (generalized): Secondary | ICD-10-CM | POA: Diagnosis not present

## 2022-08-07 DIAGNOSIS — R2681 Unsteadiness on feet: Secondary | ICD-10-CM

## 2022-08-07 DIAGNOSIS — M5442 Lumbago with sciatica, left side: Secondary | ICD-10-CM | POA: Diagnosis not present

## 2022-08-07 DIAGNOSIS — G8929 Other chronic pain: Secondary | ICD-10-CM | POA: Diagnosis not present

## 2022-08-07 NOTE — Therapy (Signed)
OUTPATIENT PHYSICAL THERAPY TREATMENT NOTE   Patient Name: Karla Willis MRN: [id number] DOB:11-13-1934, 87 y.o., female Today's Date: 08/07/2022  PCP: Lauree Chandler, NP   REFERRING PROVIDER: Lauree Chandler, NP   PT End of Session - 08/07/22 [phone]     Visit Number 2    Number of Visits --   1-2x/week   Date for PT Re-Evaluation 09/24/22    Authorization Type UHC MCR    Progress Note Due on Visit 10    PT Start Time 0917    PT Stop Time 0957    PT Time Calculation (min) 40 min             Past Medical History:  Diagnosis Date   Anemia    Arthritis    Chronic idiopathic gout    06-13-2021  per pt last flare-up  > 2 yrs ago,  right foot   CKD (chronic kidney disease), stage II    Ductal carcinoma in situ (DCIS) of right breast 10/24/2013   right upper inner- DCIS, 12-16-2013  s/p right breast lumpectomy with two re-excision's,  no chemo/ radiation and completed anti-estrogen therapy 06/ 2020;  oncologist-- dr [name]--  released pt per lov note in epic 05-01-2020   Endometrial polyp    Essential tremor    occasional hand tremor   Full dentures    GERD (gastroesophageal reflux disease)    rarely takes tums   Hiatal hernia    History of 2019 novel coronavirus disease (COVID-19)    07/ 2020  w/ moderate symptoms that resolved ,  and 2021 w/ mild symptoms;  no hospitalzation for with time but has chronic occasional dry cough since   History of temporal artery biopsy    Hypertension    Hypothyroidism, postsurgical 1970   followed by pcp   OAB (overactive bladder)    Osteopenia    PMB (postmenopausal bleeding)    Pneumonia    Type 2 diabetes mellitus ([place])    followed by pcp   (06-13-2021  per pt only checks once per week, fasting sugar-- 101-120)   Wears hearing aid in both ears    per pt only aid working is right side, but does not wear all the time   Past Surgical History:  Procedure Laterality Date   ARTERY BIOPSY Bilateral 11/07/2021   Procedure:  BILATERAL TEMPORAL ARTERY BIOPSY;  Surgeon: [name], MD;  Location: [facility];  Service: Vascular;  Laterality: Bilateral;   BREAST BIOPSY Right 10/26/2013   BREAST LUMPECTOMY WITH NEEDLE LOCALIZATION Right 12/16/2013   Procedure: BREAST LUMPECTOMY WITH NEEDLE LOCALIZATION;  Surgeon: Edward Jolly, MD;  Location: [place];  Service: General;  Laterality: Right;   CATARACT EXTRACTION W/ INTRAOCULAR LENS IMPLANT Bilateral 2019   CHOLECYSTECTOMY OPEN  1970   COLONOSCOPY     DILATATION & CURETTAGE/HYSTEROSCOPY WITH MYOSURE N/A 06/18/2021   Procedure: DILATATION & CURETTAGE/HYSTEROSCOPY WITH MYOSURE RESECTION FOR POLYPS;  Surgeon: [name], MD;  Location: [place];  Service: Gynecology;  Laterality: N/A;   DILATION AND CURETTAGE OF UTERUS  09/2000   '@[facility]'$  by dr [name]   FINGER SURGERY     right hand-   RE-EXCISION OF BREAST LUMPECTOMY Right 01/30/2014   Procedure: RE-EXCISION OF BREAST LUMPECTOMY;  Surgeon: Edward Jolly, MD;  Location: [place];  Service: General;  Laterality: Right;   RE-EXCISION OF BREAST LUMPECTOMY Right 03/06/2014   Procedure: RE-EXCISION OF BREAST LUMPECTOMY;  Surgeon: Edward Jolly, MD;  Location: [place];  Service: General;  Laterality: Right;   THYROID LOBECTOMY  1970   unilateral   TOTAL SHOULDER ARTHROPLASTY Left 03/04/2011   '@[facility]'$    TOTAL SHOULDER ARTHROPLASTY Right 05/21/2016   Procedure: RIGHT TOTAL SHOULDER ARTHROPLASTY;  Surgeon: Ninetta Lights, MD;  Location: [place];  Service: Orthopedics;  Laterality: Right;   TUBAL LIGATION     yrs ago   Patient Active Problem List   Diagnosis Date Noted   OSA (obstructive sleep apnea) 03/25/2022   Daytime sleepiness 02/03/2022   Morbid obesity ([place]) 02/03/2022   Snoring 02/14/2020   Insomnia 02/14/2020   History of breast cancer 05/20/2017   Idiopathic gout 05/20/2017   Hyperlipidemia LDL goal <100 05/20/2017    Bilateral edema of lower extremity 05/20/2017   Dyspnea on exertion 05/25/2016   Anemia 05/25/2016   Localized primary osteoarthritis of right shoulder region 05/21/2016   Osteoarthritis, multiple sites 03/09/2015   Hypothyroidism 03/09/2015   Goiter 03/09/2015   Essential hypertension, benign 03/09/2015   Diabetes mellitus, type 2 ([place]) 03/09/2015   DCIS (ductal carcinoma in situ) of breast 11/11/2013    THERAPY DIAG:  Low back pain, unspecified back pain laterality, unspecified chronicity, unspecified whether sciatica present  Muscle weakness  Unsteadiness on feet  REFERRING DIAG: Chronic left-sided low back pain with left-sided sciatica [M54.42, G89.29]  PERTINENT HISTORY: DM TII, Dyspnea on exertion, anemia, osteoporosis    PRECAUTIONS/RESTRICTIONS:   Osteoporosis  SUBJECTIVE:  Pt reports that overall she is doing well.  Her knees knees are a bit sore.  She reports HEP compliance and improvement in sxs.  Pain:  Are you having pain? Yes Pain location: midline low back pain with radiating pain posterior L LE NPRS scale:  current 2/10  Aggravating factors: bending, cleaning, sitting for long periods Relieving factors: lying down Pain description: intermittent, dull, and aching Stage: Chronic Stability: staying the same 24 hour pattern: stiff and worse in the morning   OBJECTIVE: (objective measures completed at initial evaluation unless otherwise dated)  GENERAL OBSERVATION/GAIT:           Slow antalgic gait, using SPC   SENSATION:          Light touch: Appears intact   MUSCLE LENGTH: Hamstrings: Right no restriction; Left no restriction ASLR: Right ASLR significantly reduced compared to PSLR ; Left ASLR significantly reduced compared to PSLR  Thomas test: Right significant restriction; Left significant restriction         LUMBAR AROM   AROM AROM  07/30/2022  Flexion WNL  Extension limited by 75%  Right lateral flexion limited by 50%  Left lateral  flexion limited by 50%  Right rotation limited by 25%  Left rotation limited by 25%    (Blank rows = not tested)   LE MMT:   MMT Right 07/30/2022 Left 07/30/2022  Hip flexion (L2, L3) 3 3  Knee extension (L3) 3+ 3+  Knee flexion 3+ 3+  Hip abduction 2+ 2+  Hip extension NT NT  Hip external rotation      Hip internal rotation      Hip adduction      Ankle dorsiflexion (L4)      Ankle plantarflexion (S1)      Ankle inversion      Ankle eversion      Great Toe ext (L5)      Grossly        (Blank rows = not tested, score listed  is out of 5 possible points.  N = WNL, D = diminished, C = clear for gross weakness with myotome testing, * = concordant pain with testing)     LE ROM:   ROM Right 07/30/2022 Left 07/30/2022  Hip flexion      Hip extension      Hip abduction      Hip adduction      Hip internal rotation      Hip external rotation      Knee flexion      Knee extension      Ankle dorsiflexion      Ankle plantarflexion      Ankle inversion      Ankle eversion         (Blank rows = not tested, N = WNL, * = concordant pain with testing)   Functional Tests   Eval (07/30/2022)      30'' STS: 6x  UE used? Y      Progressive balance screen (highest level completed for >/= 10''):   Feet together: 10'' Semi Tandem: R in rear 5'', L in rear 6''                                                                                                 LUMBAR SPECIAL TESTS:  Straight leg raise: L (-), R (-) Slump: L (-), R (-)   PATIENT SURVEYS:  FOTO 52 -> 59     TODAY'S TREATMENT  Creating, reviewing, and completing below HEP   PATIENT EDUCATION:  POC, diagnosis, prognosis, HEP, and outcome measures.  Pt educated via explanation, demonstration, and handout (HEP).  Pt confirms understanding verbally.    HOME EXERCISE PROGRAM: Access Code: [email or web address] URL: [email or web address] Date: 07/30/2022 Prepared by: Shearon Balo   Exercises -  Supine Active Straight Leg Raise  - 1 x daily - 7 x weekly - 3 sets - 10 reps - Hooklying Isometric Clamshell  - 1 x daily - 7 x weekly - 3 sets - 10 reps - Supine Hip Adduction Isometric with Ball  - 1 x daily - 7 x weekly - 2 sets - 10 reps - 10'' hold   ASTERISK SIGNS     Asterisk Signs Eval (07/30/2022)  9/28          ASLR=PSLR no            Pain max 8/10 5/10           30'' STS 6x w/ UE support            Hip ext Sig limitation                               TREATMENT 9/28:  Therapeutic Exercise: - nu-step L5 72mwhile taking subjective and planning session with patient - Hip flexor + light RF stretch EOM - 2' ea - supine bridge - partial ROM - x10 d/c'd d/t L posterior flank pain - LTR - 20x - alternating SLR from foam roller - 2x10 ea - hip adduction in  HL - 2-3'' hold - 2x10 - P-ball abdominal contraction - 3'' hold - 2x10  ASSESSMENT:   CLINICAL IMPRESSION: Gwyndolyn tolerated session well with no adverse reaction.  We concentrated on hip flexor flexibility to address forward flexed posture combined with core and hip strengthening.  She fatigues rapidly with mat exercises and is limited in bridge d/t L flank pain.   OBJECTIVE IMPAIRMENTS: Pain, hip and core strength, balance   ACTIVITY LIMITATIONS: bending, lifting, squatting   PERSONAL FACTORS: See medical history and pertinent history     REHAB POTENTIAL: Fair chronic   CLINICAL DECISION MAKING: Stable/uncomplicated   EVALUATION COMPLEXITY: Low     GOALS:     SHORT TERM GOALS: Target date: 08/20/2022   Lyndie will be >75% HEP compliant to improve carryover between sessions and facilitate independent management of condition   Evaluation (07/30/2022): ongoing Goal status: INITIAL     LONG TERM GOALS: Target date: 09/24/2022   Suzann will improve FOTO score to 59 as a proxy for functional improvement   Evaluation/Baseline (07/30/2022): 52 Goal status: INITIAL     2.  Avalyn will improve 85'' STS (MCID  2) to >/= 8x (w/ UE?: Y) to show improved LE strength and improved transfers    Evaluation/Baseline (07/30/2022): 6x  w/ UE? Y Goal status: INITIAL     3.  Lena will be able to stand for >20'' in semi tandem stance, to show a significant improvement in balance in order to reduce fall risk    Evaluation/Baseline (07/30/2022):   Progressive balance screen (highest level completed for >/= 10''):   Feet together: 10'' Semi Tandem: R in rear 5'', L in rear 6'' Goal status: INITIAL     4.  Shabre will self report >/= 50% decrease in pain from evaluation    Evaluation/Baseline (07/30/2022): 8/10 max pain Goal status: INITIAL       PLAN: PT FREQUENCY: 1-2x/week   PT DURATION: 8 weeks (Ending 09/24/2022)   PLANNED INTERVENTIONS: Therapeutic exercises, Aquatic therapy, Therapeutic activity, Neuro Muscular re-education, Gait training, Patient/Family education, Joint mobilization, Dry Needling, Electrical stimulation, Spinal mobilization and/or manipulation, Moist heat, Taping, Vasopneumatic device, Ionotophoresis '4mg'$ /ml Dexamethasone, and Manual therapy   PLAN FOR NEXT SESSION: progressive hip/core/LE strengthening, balance, gait   Kevan Ny Craig Wisnewski PT 08/07/2022, 9:58 AM

## 2022-08-09 DIAGNOSIS — G4733 Obstructive sleep apnea (adult) (pediatric): Secondary | ICD-10-CM | POA: Diagnosis not present

## 2022-08-13 ENCOUNTER — Ambulatory Visit: Payer: Medicare Other | Attending: Nurse Practitioner

## 2022-08-13 DIAGNOSIS — R2681 Unsteadiness on feet: Secondary | ICD-10-CM | POA: Diagnosis not present

## 2022-08-13 DIAGNOSIS — M6281 Muscle weakness (generalized): Secondary | ICD-10-CM | POA: Diagnosis not present

## 2022-08-13 DIAGNOSIS — M545 Low back pain, unspecified: Secondary | ICD-10-CM | POA: Diagnosis not present

## 2022-08-13 NOTE — Therapy (Signed)
OUTPATIENT PHYSICAL THERAPY TREATMENT NOTE   Patient Name: Karla Willis MRN: [id number] DOB:1935/01/15, 87 y.o., female Today's Date: 08/13/2022  PCP: Lauree Chandler, NP   REFERRING PROVIDER: Lauree Chandler, NP   PT End of Session - 08/13/22 1359     Visit Number 3    Date for PT Re-Evaluation 09/24/22    Authorization Type UHC MCR    Authorization Time Period FOTO v6, v10, kx mod v15    Progress Note Due on Visit 10    PT Start Time 1400    PT Stop Time 1441    PT Time Calculation (min) 41 min    Activity Tolerance Patient tolerated treatment well    Behavior During Therapy WFL for tasks assessed/performed              Past Medical History:  Diagnosis Date   Anemia    Arthritis    Chronic idiopathic gout    06-13-2021  per pt last flare-up  > 2 yrs ago,  right foot   CKD (chronic kidney disease), stage II    Ductal carcinoma in situ (DCIS) of right breast 10/24/2013   right upper inner- DCIS, 12-16-2013  s/p right breast lumpectomy with two re-excision's,  no chemo/ radiation and completed anti-estrogen therapy 06/ 2020;  oncologist-- dr [name]--  released pt per lov note in epic 05-01-2020   Endometrial polyp    Essential tremor    occasional hand tremor   Full dentures    GERD (gastroesophageal reflux disease)    rarely takes tums   Hiatal hernia    History of 2019 novel coronavirus disease (COVID-19)    07/ 2020  w/ moderate symptoms that resolved ,  and 2021 w/ mild symptoms;  no hospitalzation for with time but has chronic occasional dry cough since   History of temporal artery biopsy    Hypertension    Hypothyroidism, postsurgical 1970   followed by pcp   OAB (overactive bladder)    Osteopenia    PMB (postmenopausal bleeding)    Pneumonia    Type 2 diabetes mellitus ([place])    followed by pcp   (06-13-2021  per pt only checks once per week, fasting sugar-- 101-120)   Wears hearing aid in both ears    per pt only aid working is right side,  but does not wear all the time   Past Surgical History:  Procedure Laterality Date   ARTERY BIOPSY Bilateral 11/07/2021   Procedure: BILATERAL TEMPORAL ARTERY BIOPSY;  Surgeon: [name], MD;  Location: [facility] OR;  Service: Vascular;  Laterality: Bilateral;   BREAST BIOPSY Right 10/26/2013   BREAST LUMPECTOMY WITH NEEDLE LOCALIZATION Right 12/16/2013   Procedure: BREAST LUMPECTOMY WITH NEEDLE LOCALIZATION;  Surgeon: Edward Jolly, MD;  Location: [place];  Service: General;  Laterality: Right;   CATARACT EXTRACTION W/ INTRAOCULAR LENS IMPLANT Bilateral 2019   CHOLECYSTECTOMY OPEN  1970   COLONOSCOPY     DILATATION & CURETTAGE/HYSTEROSCOPY WITH MYOSURE N/A 06/18/2021   Procedure: DILATATION & CURETTAGE/HYSTEROSCOPY WITH MYOSURE RESECTION FOR POLYPS;  Surgeon: [name], MD;  Location: [place];  Service: Gynecology;  Laterality: N/A;   DILATION AND CURETTAGE OF UTERUS  09/2000   '@[facility]'$  by dr [name]   FINGER SURGERY     right hand-   RE-EXCISION OF BREAST LUMPECTOMY Right 01/30/2014   Procedure: RE-EXCISION OF BREAST LUMPECTOMY;  Surgeon: Edward Jolly, MD;  Location: [facility];  Service: General;  Laterality: Right;   RE-EXCISION OF BREAST LUMPECTOMY Right 03/06/2014   Procedure: RE-EXCISION OF BREAST LUMPECTOMY;  Surgeon: Edward Jolly, MD;  Location: [place];  Service: General;  Laterality: Right;   THYROID LOBECTOMY  1970   unilateral   TOTAL SHOULDER ARTHROPLASTY Left 03/04/2011   '@[facility]'$    TOTAL SHOULDER ARTHROPLASTY Right 05/21/2016   Procedure: RIGHT TOTAL SHOULDER ARTHROPLASTY;  Surgeon: Ninetta Lights, MD;  Location: [place];  Service: Orthopedics;  Laterality: Right;   TUBAL LIGATION     yrs ago   Patient Active Problem List   Diagnosis Date Noted   OSA (obstructive sleep apnea) 03/25/2022   Daytime sleepiness 02/03/2022   Morbid obesity ([place]) 02/03/2022   Snoring 02/14/2020    Insomnia 02/14/2020   History of breast cancer 05/20/2017   Idiopathic gout 05/20/2017   Hyperlipidemia LDL goal <100 05/20/2017   Bilateral edema of lower extremity 05/20/2017   Dyspnea on exertion 05/25/2016   Anemia 05/25/2016   Localized primary osteoarthritis of right shoulder region 05/21/2016   Osteoarthritis, multiple sites 03/09/2015   Hypothyroidism 03/09/2015   Goiter 03/09/2015   Essential hypertension, benign 03/09/2015   Diabetes mellitus, type 2 ([place]) 03/09/2015   DCIS (ductal carcinoma in situ) of breast 11/11/2013    THERAPY DIAG:  Low back pain, unspecified back pain laterality, unspecified chronicity, unspecified whether sciatica present  Muscle weakness  Unsteadiness on feet  REFERRING DIAG: Chronic left-sided low back pain with left-sided sciatica [M54.42, G89.29]  PERTINENT HISTORY: DM TII, Dyspnea on exertion, anemia, osteoporosis    PRECAUTIONS/RESTRICTIONS:   Osteoporosis  SUBJECTIVE:  Pt reports continued 2/10 LBP and Lt hip pain. She reports daily adherence to her HEP.  Pain:  Are you having pain? Yes Pain location: midline low back pain with radiating pain posterior L LE NPRS scale:  current 2/10  Aggravating factors: bending, cleaning, sitting for long periods Relieving factors: lying down Pain description: intermittent, dull, and aching Stage: Chronic Stability: staying the same 24 hour pattern: stiff and worse in the morning   OBJECTIVE: (objective measures completed at initial evaluation unless otherwise dated)  GENERAL OBSERVATION/GAIT:           Slow antalgic gait, using SPC   SENSATION:          Light touch: Appears intact   MUSCLE LENGTH: Hamstrings: Right no restriction; Left no restriction ASLR: Right ASLR significantly reduced compared to PSLR ; Left ASLR significantly reduced compared to PSLR  Thomas test: Right significant restriction; Left significant restriction         LUMBAR AROM   AROM AROM  07/30/2022   Flexion WNL  Extension limited by 75%  Right lateral flexion limited by 50%  Left lateral flexion limited by 50%  Right rotation limited by 25%  Left rotation limited by 25%    (Blank rows = not tested)   LE MMT:   MMT Right 07/30/2022 Left 07/30/2022  Hip flexion (L2, L3) 3 3  Knee extension (L3) 3+ 3+  Knee flexion 3+ 3+  Hip abduction 2+ 2+  Hip extension NT NT  Hip external rotation      Hip internal rotation      Hip adduction      Ankle dorsiflexion (L4)      Ankle plantarflexion (S1)      Ankle inversion      Ankle eversion      Great Toe ext (L5)      Grossly        (  Blank rows = not tested, score listed is out of 5 possible points.  N = WNL, D = diminished, C = clear for gross weakness with myotome testing, * = concordant pain with testing)     LE ROM:   ROM Right 07/30/2022 Left 07/30/2022  Hip flexion      Hip extension      Hip abduction      Hip adduction      Hip internal rotation      Hip external rotation      Knee flexion      Knee extension      Ankle dorsiflexion      Ankle plantarflexion      Ankle inversion      Ankle eversion         (Blank rows = not tested, N = WNL, * = concordant pain with testing)   Functional Tests   Eval (07/30/2022)      30'' STS: 6x  UE used? Y      Progressive balance screen (highest level completed for >/= 10''):   Feet together: 10'' Semi Tandem: R in rear 5'', L in rear 6''                                                                                                 LUMBAR SPECIAL TESTS:  Straight leg raise: L (-), R (-) Slump: L (-), R (-)   PATIENT SURVEYS:  FOTO 52 -> 59     TODAY'S TREATMENT  Creating, reviewing, and completing below HEP   PATIENT EDUCATION:  POC, diagnosis, prognosis, HEP, and outcome measures.  Pt educated via explanation, demonstration, and handout (HEP).  Pt confirms understanding verbally.    HOME EXERCISE PROGRAM: Access Code: [email or web address] URL:  [email or web address] Date: 07/30/2022 Prepared by: Shearon Balo   Exercises - Supine Active Straight Leg Raise  - 1 x daily - 7 x weekly - 3 sets - 10 reps - Hooklying Isometric Clamshell  - 1 x daily - 7 x weekly - 3 sets - 10 reps - Supine Hip Adduction Isometric with Ball  - 1 x daily - 7 x weekly - 2 sets - 10 reps - 10'' hold   ASTERISK SIGNS     Asterisk Signs Eval (07/30/2022)  9/28          ASLR=PSLR no            Pain max 8/10 5/10           30'' STS 6x w/ UE support            Hip ext Sig limitation                              [facility] Adult PT Treatment:                                                DATE: 08/13/2022  Therapeutic Exercise: 10# kettlebell dead lift in front of table 3x8 Supine with legs off end of table with alternating hip flexion and subsequent slow lowering with 2# ankle weights 2x10 BIL PT-assisted Thomas stretch x19mn BIL Hooklying mini curl ups 3x10 Hooklying LTR 2x10 Standing hip abduction with 3# cable to ankle 2x10 BIL Manual Therapy: N/A Neuromuscular re-ed: N/A Therapeutic Activity: N/A Modalities: N/A Self Care: N/A   TREATMENT 9/28:  Therapeutic Exercise: - nu-step L5 571mhile taking subjective and planning session with patient - Hip flexor + light RF stretch EOM - 2' ea - supine bridge - partial ROM - x10 d/c'd d/t L posterior flank pain - LTR - 20x - alternating SLR from foam roller - 2x10 ea - hip adduction in HL - 2-3'' hold - 2x10 - P-ball abdominal contraction - 3'' hold - 2x10  ASSESSMENT:   CLINICAL IMPRESSION: Pt responded well to progressed core/ hip loading today, demonstrating good form and no increase in pain with selected exercises. She will continue to benefit from skilled PT to address her primary impairments and return to her prior level of function with less limitation.   OBJECTIVE IMPAIRMENTS: Pain, hip and core strength, balance   ACTIVITY LIMITATIONS: bending, lifting, squatting    PERSONAL FACTORS: See medical history and pertinent history       GOALS:     SHORT TERM GOALS: Target date: 08/20/2022   MaHaliaill be >75% HEP compliant to improve carryover between sessions and facilitate independent management of condition   Evaluation (07/30/2022): ongoing 08/13/2022: Pt reports daily HEP adherence Goal status: ACHIEVED     LONG TERM GOALS: Target date: 09/24/2022   MaTayjaill improve FOTO score to 59 as a proxy for functional improvement   Evaluation/Baseline (07/30/2022): 52 Goal status: INITIAL     2.  MaKerahill improve 3010 STS (MCID 2) to >/= 8x (w/ UE?: Y) to show improved LE strength and improved transfers    Evaluation/Baseline (07/30/2022): 6x  w/ UE? Y Goal status: INITIAL     3.  MaSherineill be able to stand for >20'' in semi tandem stance, to show a significant improvement in balance in order to reduce fall risk    Evaluation/Baseline (07/30/2022):   Progressive balance screen (highest level completed for >/= 10''):   Feet together: 10'' Semi Tandem: R in rear 5'', L in rear 6'' Goal status: INITIAL     4.  MaAzariyaill self report >/= 50% decrease in pain from evaluation    Evaluation/Baseline (07/30/2022): 8/10 max pain Goal status: INITIAL       PLAN: PT FREQUENCY: 1-2x/week   PT DURATION: 8 weeks (Ending 09/24/2022)   PLANNED INTERVENTIONS: Therapeutic exercises, Aquatic therapy, Therapeutic activity, Neuro Muscular re-education, Gait training, Patient/Family education, Joint mobilization, Dry Needling, Electrical stimulation, Spinal mobilization and/or manipulation, Moist heat, Taping, Vasopneumatic device, Ionotophoresis '4mg'$ /ml Dexamethasone, and Manual therapy   PLAN FOR NEXT SESSION: progressive hip/core/LE strengthening, balance, gait  YaVanessa DurhamPT, DPT 08/13/22 2:41 PM

## 2022-08-14 ENCOUNTER — Ambulatory Visit: Payer: Medicare Other

## 2022-08-21 ENCOUNTER — Ambulatory Visit: Payer: Medicare Other

## 2022-08-21 DIAGNOSIS — M6281 Muscle weakness (generalized): Secondary | ICD-10-CM

## 2022-08-21 DIAGNOSIS — M545 Low back pain, unspecified: Secondary | ICD-10-CM

## 2022-08-21 DIAGNOSIS — R2681 Unsteadiness on feet: Secondary | ICD-10-CM | POA: Diagnosis not present

## 2022-08-21 NOTE — Therapy (Signed)
OUTPATIENT PHYSICAL THERAPY TREATMENT NOTE   Patient Name: Karla Willis MRN: [id number] DOB:May 19, 1935, 87 y.o., female Today's Date: 08/21/2022  PCP: Lauree Chandler, NP   REFERRING PROVIDER: Lauree Chandler, NP   PT End of Session - 08/21/22 1531     Visit Number 4    Date for PT Re-Evaluation 09/24/22    Authorization Type UHC MCR    Authorization Time Period FOTO v6, v10, kx mod v15    Progress Note Due on Visit 10    PT Start Time 6387    PT Stop Time 1611    PT Time Calculation (min) 40 min    Activity Tolerance Patient tolerated treatment well    Behavior During Therapy WFL for tasks assessed/performed               Past Medical History:  Diagnosis Date   Anemia    Arthritis    Chronic idiopathic gout    06-13-2021  per pt last flare-up  > 2 yrs ago,  right foot   CKD (chronic kidney disease), stage II    Ductal carcinoma in situ (DCIS) of right breast 10/24/2013   right upper inner- DCIS, 12-16-2013  s/p right breast lumpectomy with two re-excision's,  no chemo/ radiation and completed anti-estrogen therapy 06/ 2020;  oncologist-- dr [name]--  released pt per lov note in epic 05-01-2020   Endometrial polyp    Essential tremor    occasional hand tremor   Full dentures    GERD (gastroesophageal reflux disease)    rarely takes tums   Hiatal hernia    History of 2019 novel coronavirus disease (COVID-19)    07/ 2020  w/ moderate symptoms that resolved ,  and 2021 w/ mild symptoms;  no hospitalzation for with time but has chronic occasional dry cough since   History of temporal artery biopsy    Hypertension    Hypothyroidism, postsurgical 1970   followed by pcp   OAB (overactive bladder)    Osteopenia    PMB (postmenopausal bleeding)    Pneumonia    Type 2 diabetes mellitus ([place])    followed by pcp   (06-13-2021  per pt only checks once per week, fasting sugar-- 101-120)   Wears hearing aid in both ears    per pt only aid working is right  side, but does not wear all the time   Past Surgical History:  Procedure Laterality Date   ARTERY BIOPSY Bilateral 11/07/2021   Procedure: BILATERAL TEMPORAL ARTERY BIOPSY;  Surgeon: [name], MD;  Location: [facility] OR;  Service: Vascular;  Laterality: Bilateral;   BREAST BIOPSY Right 10/26/2013   BREAST LUMPECTOMY WITH NEEDLE LOCALIZATION Right 12/16/2013   Procedure: BREAST LUMPECTOMY WITH NEEDLE LOCALIZATION;  Surgeon: Edward Jolly, MD;  Location: [place];  Service: General;  Laterality: Right;   CATARACT EXTRACTION W/ INTRAOCULAR LENS IMPLANT Bilateral 2019   CHOLECYSTECTOMY OPEN  1970   COLONOSCOPY     DILATATION & CURETTAGE/HYSTEROSCOPY WITH MYOSURE N/A 06/18/2021   Procedure: DILATATION & CURETTAGE/HYSTEROSCOPY WITH MYOSURE RESECTION FOR POLYPS;  Surgeon: [name], MD;  Location: [place];  Service: Gynecology;  Laterality: N/A;   DILATION AND CURETTAGE OF UTERUS  09/2000   '@[facility]'$  by dr [name]   FINGER SURGERY     right hand-   RE-EXCISION OF BREAST LUMPECTOMY Right 01/30/2014   Procedure: RE-EXCISION OF BREAST LUMPECTOMY;  Surgeon: Edward Jolly, MD;  Location: [facility];  Service: General;  Laterality: Right;   RE-EXCISION OF BREAST LUMPECTOMY Right 03/06/2014   Procedure: RE-EXCISION OF BREAST LUMPECTOMY;  Surgeon: Edward Jolly, MD;  Location: [place];  Service: General;  Laterality: Right;   THYROID LOBECTOMY  1970   unilateral   TOTAL SHOULDER ARTHROPLASTY Left 03/04/2011   '@[facility]'$    TOTAL SHOULDER ARTHROPLASTY Right 05/21/2016   Procedure: RIGHT TOTAL SHOULDER ARTHROPLASTY;  Surgeon: Ninetta Lights, MD;  Location: [place];  Service: Orthopedics;  Laterality: Right;   TUBAL LIGATION     yrs ago   Patient Active Problem List   Diagnosis Date Noted   OSA (obstructive sleep apnea) 03/25/2022   Daytime sleepiness 02/03/2022   Morbid obesity ([place]) 02/03/2022   Snoring  02/14/2020   Insomnia 02/14/2020   History of breast cancer 05/20/2017   Idiopathic gout 05/20/2017   Hyperlipidemia LDL goal <100 05/20/2017   Bilateral edema of lower extremity 05/20/2017   Dyspnea on exertion 05/25/2016   Anemia 05/25/2016   Localized primary osteoarthritis of right shoulder region 05/21/2016   Osteoarthritis, multiple sites 03/09/2015   Hypothyroidism 03/09/2015   Goiter 03/09/2015   Essential hypertension, benign 03/09/2015   Diabetes mellitus, type 2 ([place]) 03/09/2015   DCIS (ductal carcinoma in situ) of breast 11/11/2013    THERAPY DIAG:  Low back pain, unspecified back pain laterality, unspecified chronicity, unspecified whether sciatica present  Muscle weakness  Unsteadiness on feet  REFERRING DIAG: Chronic left-sided low back pain with left-sided sciatica [M54.42, G89.29]  PERTINENT HISTORY: DM TII, Dyspnea on exertion, anemia, osteoporosis    PRECAUTIONS/RESTRICTIONS:   Osteoporosis  SUBJECTIVE:  Pt reports she can tell much improvement in her lumbar and hip pain, adding that exercises have been helpful. She also reports new onset of Rt elbow pain that is insidious in nature, rated 2/10.   Pain:  Are you having pain? Yes Pain location: midline low back pain with radiating pain posterior L LE NPRS scale:  current 0/10  Aggravating factors: bending, cleaning, sitting for long periods Relieving factors: lying down Pain description: intermittent, dull, and aching Stage: Chronic Stability: staying the same 24 hour pattern: stiff and worse in the morning   OBJECTIVE: (objective measures completed at initial evaluation unless otherwise dated)  GENERAL OBSERVATION/GAIT:           Slow antalgic gait, using SPC   SENSATION:          Light touch: Appears intact   MUSCLE LENGTH: Hamstrings: Right no restriction; Left no restriction ASLR: Right ASLR significantly reduced compared to PSLR ; Left ASLR significantly reduced compared to PSLR  Thomas  test: Right significant restriction; Left significant restriction         LUMBAR AROM   AROM AROM  07/30/2022  Flexion WNL  Extension limited by 75%  Right lateral flexion limited by 50%  Left lateral flexion limited by 50%  Right rotation limited by 25%  Left rotation limited by 25%    (Blank rows = not tested)   LE MMT:   MMT Right 07/30/2022 Left 07/30/2022  Hip flexion (L2, L3) 3 3  Knee extension (L3) 3+ 3+  Knee flexion 3+ 3+  Hip abduction 2+ 2+  Hip extension NT NT  Hip external rotation      Hip internal rotation      Hip adduction      Ankle dorsiflexion (L4)      Ankle plantarflexion (S1)      Ankle inversion  Ankle eversion      Great Toe ext (L5)      Grossly        (Blank rows = not tested, score listed is out of 5 possible points.  N = WNL, D = diminished, C = clear for gross weakness with myotome testing, * = concordant pain with testing)     LE ROM:   ROM Right 07/30/2022 Left 07/30/2022  Hip flexion      Hip extension      Hip abduction      Hip adduction      Hip internal rotation      Hip external rotation      Knee flexion      Knee extension      Ankle dorsiflexion      Ankle plantarflexion      Ankle inversion      Ankle eversion         (Blank rows = not tested, N = WNL, * = concordant pain with testing)   Functional Tests   Eval (07/30/2022) 08/21/2022     30'' STS: 6x  UE used? Y      Progressive balance screen (highest level completed for >/= 10''):   Feet together: 10'' Semi Tandem: R in rear 5'', L in rear 6''                                                                                                 LUMBAR SPECIAL TESTS:  Straight leg raise: L (-), R (-) Slump: L (-), R (-)   PATIENT SURVEYS:  FOTO 52 -> 59     TODAY'S TREATMENT  Creating, reviewing, and completing below HEP   PATIENT EDUCATION:  POC, diagnosis, prognosis, HEP, and outcome measures.  Pt educated via explanation,  demonstration, and handout (HEP).  Pt confirms understanding verbally.    HOME EXERCISE PROGRAM: Access Code: [email or web address] URL: [email or web address] Date: 07/30/2022 Prepared by: Shearon Balo   Exercises - Supine Active Straight Leg Raise  - 1 x daily - 7 x weekly - 3 sets - 10 reps - Hooklying Isometric Clamshell  - 1 x daily - 7 x weekly - 3 sets - 10 reps - Supine Hip Adduction Isometric with Ball  - 1 x daily - 7 x weekly - 2 sets - 10 reps - 10'' hold   ASTERISK SIGNS     Asterisk Signs Eval (07/30/2022)  9/28 08/21/2022         ASLR=PSLR no            Pain max 8/10 5/10           30'' STS 6x w/ UE support    6x without UE support        Hip ext Sig limitation                              [facility] Adult PT Treatment:  DATE: 08/21/2022 Therapeutic Exercise: Standing abdominal press-down into red physioball at table 4x30 seconds Mini-squat side steps with 7# cable to waist attachment x5 walkouts (roughly 10 steps each walkout) BIL Sidelying lumbar open books x10 BIL Sidelying hip abduction 2x10 BIL 15# kettlebell dead lift in front of table 3x10 Manual Therapy: N/A Neuromuscular re-ed: N/A Therapeutic Activity: N/A Modalities: N/A Self Care: N/A   [facility] Adult PT Treatment:                                                DATE: 08/13/2022 Therapeutic Exercise: 10# kettlebell dead lift in front of table 3x8 Supine with legs off end of table with alternating hip flexion and subsequent slow lowering with 2# ankle weights 2x10 BIL PT-assisted Thomas stretch x52mn BIL Hooklying mini curl ups 3x10 Hooklying LTR 2x10 Standing hip abduction with 3# cable to ankle 2x10 BIL Manual Therapy: N/A Neuromuscular re-ed: N/A Therapeutic Activity: N/A Modalities: N/A Self Care: N/A   TREATMENT 9/28:  Therapeutic Exercise: - nu-step L5 567mhile taking subjective and planning session with patient - Hip flexor + light  RF stretch EOM - 2' ea - supine bridge - partial ROM - x10 d/c'd d/t L posterior flank pain - LTR - 20x - alternating SLR from foam roller - 2x10 ea - hip adduction in HL - 2-3'' hold - 2x10 - P-ball abdominal contraction - 3'' hold - 2x10   ASSESSMENT:   CLINICAL IMPRESSION: Pt responded well to all interventions today, demonstrating good form and no increase in pain with new exercises. Upon re-assessment, the pt has made good progress in her 30" STS goal, able to accomplish 6 reps without her hands; she required UE support at eval. Pt will continue to benefit from skilled PT to address her primary impairments and return to her prior level of function with less limitation.    OBJECTIVE IMPAIRMENTS: Pain, hip and core strength, balance   ACTIVITY LIMITATIONS: bending, lifting, squatting   PERSONAL FACTORS: See medical history and pertinent history       GOALS:     SHORT TERM GOALS: Target date: 08/20/2022   MaVerlenaill be >75% HEP compliant to improve carryover between sessions and facilitate independent management of condition   Evaluation (07/30/2022): ongoing 08/13/2022: Pt reports daily HEP adherence Goal status: ACHIEVED     LONG TERM GOALS: Target date: 09/24/2022   MaKathryneill improve FOTO score to 59 as a proxy for functional improvement   Evaluation/Baseline (07/30/2022): 52 Goal status: INITIAL     2.  MaJaquelinneill improve 3073 STS (MCID 2) to >/= 8x (w/ UE?: Y) to show improved LE strength and improved transfers    Evaluation/Baseline (07/30/2022): 6x  w/ UE? Y 08/21/2022: x6 WITHOUT UE support Goal status: IN PROGRESS     3.  MaZaynebill be able to stand for >20'' in semi tandem stance, to show a significant improvement in balance in order to reduce fall risk    Evaluation/Baseline (07/30/2022):   Progressive balance screen (highest level completed for >/= 10''):   Feet together: 10'' Semi Tandem: R in rear 5'', L in rear 6'' Goal status: INITIAL      4.  MaCristelaill self report >/= 50% decrease in pain from evaluation    Evaluation/Baseline (07/30/2022): 8/10 max pain Goal status: INITIAL       PLAN:  PT FREQUENCY: 1-2x/week   PT DURATION: 8 weeks (Ending 09/24/2022)   PLANNED INTERVENTIONS: Therapeutic exercises, Aquatic therapy, Therapeutic activity, Neuro Muscular re-education, Gait training, Patient/Family education, Joint mobilization, Dry Needling, Electrical stimulation, Spinal mobilization and/or manipulation, Moist heat, Taping, Vasopneumatic device, Ionotophoresis '4mg'$ /ml Dexamethasone, and Manual therapy   PLAN FOR NEXT SESSION: progressive hip/core/LE strengthening, balance, gait   Vanessa [place], PT, DPT 08/21/22 4:12 PM

## 2022-08-25 NOTE — Telephone Encounter (Signed)
[facility]rol[facility] Rep requested to fill out [facility] Enrollment Form and faxed to them for patient to get her medication through them cheaper.   Filled out Enrollment form and placed in [place] folder to review and sign.   Once signed, to fax to [facility] and then a 1-800 # will call patient with Pricing.  Fax:[phone]

## 2022-08-28 ENCOUNTER — Ambulatory Visit: Payer: Medicare Other | Admitting: Physical Therapy

## 2022-08-28 ENCOUNTER — Encounter: Payer: Self-pay | Admitting: *Deleted

## 2022-08-28 NOTE — Telephone Encounter (Signed)
Error

## 2022-08-28 NOTE — Telephone Encounter (Signed)
Form Faxed to [facility] patient to inform that someone with [employer] would be calling her with the Pricing of the Prol[facility].   Cannot leave VM due to full mailbox. Will try again.

## 2022-09-01 NOTE — Telephone Encounter (Signed)
Patient notified and agreed and stated that she will be watching for their call.

## 2022-09-03 ENCOUNTER — Ambulatory Visit: Payer: Medicare Other | Admitting: Physical Therapy

## 2022-09-03 ENCOUNTER — Encounter: Payer: Self-pay | Admitting: Physical Therapy

## 2022-09-03 DIAGNOSIS — R2681 Unsteadiness on feet: Secondary | ICD-10-CM | POA: Diagnosis not present

## 2022-09-03 DIAGNOSIS — M6281 Muscle weakness (generalized): Secondary | ICD-10-CM | POA: Diagnosis not present

## 2022-09-03 DIAGNOSIS — M545 Low back pain, unspecified: Secondary | ICD-10-CM | POA: Diagnosis not present

## 2022-09-03 NOTE — Therapy (Signed)
OUTPATIENT PHYSICAL THERAPY TREATMENT NOTE   Patient Name: Karla Willis MRN: [id number] DOB:Mar 13, 1935, 87 y.o., female Today's Date: 09/03/2022  PCP: Lauree Chandler, NP   REFERRING PROVIDER: Lauree Chandler, NP   PT End of Session - 09/03/22 1538     Visit Number 5    Date for PT Re-Evaluation 09/24/22    Authorization Type [employer] MCR    Authorization Time Period FOTO v6, v10, kx mod v15    Progress Note Due on Visit 10    PT Start Time 4098    PT Stop Time 1610    PT Time Calculation (min) 32 min    Activity Tolerance Patient tolerated treatment well    Behavior During Therapy WFL for tasks assessed/performed               Past Medical History:  Diagnosis Date   Anemia    Arthritis    Chronic idiopathic gout    06-13-2021  per pt last flare-up  > 2 yrs ago,  right foot   CKD (chronic kidney disease), stage II    Ductal carcinoma in situ (DCIS) of right breast 10/24/2013   right upper inner- DCIS, 12-16-2013  s/p right breast lumpectomy with two re-excision's,  no chemo/ radiation and completed anti-estrogen therapy 06/ 2020;  oncologist-- dr [name]--  released pt per lov note in epic 05-01-2020   Endometrial polyp    Essential tremor    occasional hand tremor   Full dentures    GERD (gastroesophageal reflux disease)    rarely takes tums   Hiatal hernia    History of 2019 novel coronavirus disease (COVID-19)    07/ 2020  w/ moderate symptoms that resolved ,  and 2021 w/ mild symptoms;  no hospitalzation for with time but has chronic occasional dry cough since   History of temporal artery biopsy    Hypertension    Hypothyroidism, postsurgical 1970   followed by pcp   OAB (overactive bladder)    Osteopenia    PMB (postmenopausal bleeding)    Pneumonia    Type 2 diabetes mellitus ([place])    followed by pcp   (06-13-2021  per pt only checks once per week, fasting sugar-- 101-120)   Wears hearing aid in both ears    per pt only aid working is right  side, but does not wear all the time   Past Surgical History:  Procedure Laterality Date   ARTERY BIOPSY Bilateral 11/07/2021   Procedure: BILATERAL TEMPORAL ARTERY BIOPSY;  Surgeon: [name], MD;  Location: [facility] OR;  Service: Vascular;  Laterality: Bilateral;   BREAST BIOPSY Right 10/26/2013   BREAST LUMPECTOMY WITH NEEDLE LOCALIZATION Right 12/16/2013   Procedure: BREAST LUMPECTOMY WITH NEEDLE LOCALIZATION;  Surgeon: Edward Jolly, MD;  Location: [place];  Service: General;  Laterality: Right;   CATARACT EXTRACTION W/ INTRAOCULAR LENS IMPLANT Bilateral 2019   CHOLECYSTECTOMY OPEN  1970   COLONOSCOPY     DILATATION & CURETTAGE/HYSTEROSCOPY WITH MYOSURE N/A 06/18/2021   Procedure: DILATATION & CURETTAGE/HYSTEROSCOPY WITH MYOSURE RESECTION FOR POLYPS;  Surgeon: [name], MD;  Location: [facility];  Service: Gynecology;  Laterality: N/A;   DILATION AND CURETTAGE OF UTERUS  09/2000   '@[facility]'$  by dr [name]   FINGER SURGERY     right hand-   RE-EXCISION OF BREAST LUMPECTOMY Right 01/30/2014   Procedure: RE-EXCISION OF BREAST LUMPECTOMY;  Surgeon: Edward Jolly, MD;  Location: [facility];  Service: General;  Laterality: Right;   RE-EXCISION OF BREAST LUMPECTOMY Right 03/06/2014   Procedure: RE-EXCISION OF BREAST LUMPECTOMY;  Surgeon: Edward Jolly, MD;  Location: [facility];  Service: General;  Laterality: Right;   THYROID LOBECTOMY  1970   unilateral   TOTAL SHOULDER ARTHROPLASTY Left 03/04/2011   '@[facility]'$    TOTAL SHOULDER ARTHROPLASTY Right 05/21/2016   Procedure: RIGHT TOTAL SHOULDER ARTHROPLASTY;  Surgeon: Ninetta Lights, MD;  Location: [place];  Service: Orthopedics;  Laterality: Right;   TUBAL LIGATION     yrs ago   Patient Active Problem List   Diagnosis Date Noted   OSA (obstructive sleep apnea) 03/25/2022   Daytime sleepiness 02/03/2022   Morbid obesity ([place]) 02/03/2022   Snoring  02/14/2020   Insomnia 02/14/2020   History of breast cancer 05/20/2017   Idiopathic gout 05/20/2017   Hyperlipidemia LDL goal <100 05/20/2017   Bilateral edema of lower extremity 05/20/2017   Dyspnea on exertion 05/25/2016   Anemia 05/25/2016   Localized primary osteoarthritis of right shoulder region 05/21/2016   Osteoarthritis, multiple sites 03/09/2015   Hypothyroidism 03/09/2015   Goiter 03/09/2015   Essential hypertension, benign 03/09/2015   Diabetes mellitus, type 2 ([place]) 03/09/2015   DCIS (ductal carcinoma in situ) of breast 11/11/2013    THERAPY DIAG:  Low back pain, unspecified back pain laterality, unspecified chronicity, unspecified whether sciatica present  Muscle weakness  Unsteadiness on feet  REFERRING DIAG: Chronic left-sided low back pain with left-sided sciatica [M54.42, G89.29]  PERTINENT HISTORY: DM TII, Dyspnea on exertion, anemia, osteoporosis    PRECAUTIONS/RESTRICTIONS:   Osteoporosis  SUBJECTIVE:  Pt reports that her back pain continues to improve.  She feels the exercises have been helpful.  Pain:  Are you having pain? Yes Pain location: midline low back pain with radiating pain posterior L LE NPRS scale:  current 0/10  Aggravating factors: bending, cleaning, sitting for long periods Relieving factors: lying down Pain description: intermittent, dull, and aching Stage: Chronic Stability: staying the same 24 hour pattern: stiff and worse in the morning   OBJECTIVE: (objective measures completed at initial evaluation unless otherwise dated)  GENERAL OBSERVATION/GAIT:           Slow antalgic gait, using SPC   SENSATION:          Light touch: Appears intact   MUSCLE LENGTH: Hamstrings: Right no restriction; Left no restriction ASLR: Right ASLR significantly reduced compared to PSLR ; Left ASLR significantly reduced compared to PSLR  Thomas test: Right significant restriction; Left significant restriction         LUMBAR AROM   AROM  AROM  07/30/2022  Flexion WNL  Extension limited by 75%  Right lateral flexion limited by 50%  Left lateral flexion limited by 50%  Right rotation limited by 25%  Left rotation limited by 25%    (Blank rows = not tested)   LE MMT:   MMT Right 07/30/2022 Left 07/30/2022  Hip flexion (L2, L3) 3 3  Knee extension (L3) 3+ 3+  Knee flexion 3+ 3+  Hip abduction 2+ 2+  Hip extension NT NT  Hip external rotation      Hip internal rotation      Hip adduction      Ankle dorsiflexion (L4)      Ankle plantarflexion (S1)      Ankle inversion      Ankle eversion      Great Toe ext (L5)  Grossly        (Blank rows = not tested, score listed is out of 5 possible points.  N = WNL, D = diminished, C = clear for gross weakness with myotome testing, * = concordant pain with testing)     LE ROM:   ROM Right 07/30/2022 Left 07/30/2022  Hip flexion      Hip extension      Hip abduction      Hip adduction      Hip internal rotation      Hip external rotation      Knee flexion      Knee extension      Ankle dorsiflexion      Ankle plantarflexion      Ankle inversion      Ankle eversion         (Blank rows = not tested, N = WNL, * = concordant pain with testing)   Functional Tests   Eval (07/30/2022) 08/21/2022     30'' STS: 6x  UE used? Y      Progressive balance screen (highest level completed for >/= 10''):   Feet together: 10'' Semi Tandem: R in rear 5'', L in rear 6''                                                                                                 LUMBAR SPECIAL TESTS:  Straight leg raise: L (-), R (-) Slump: L (-), R (-)   PATIENT SURVEYS:  FOTO 52 -> 59     TODAY'S TREATMENT  Creating, reviewing, and completing below HEP   PATIENT EDUCATION:  POC, diagnosis, prognosis, HEP, and outcome measures.  Pt educated via explanation, demonstration, and handout (HEP).  Pt confirms understanding verbally.    HOME EXERCISE PROGRAM: Access  Code: [email or web address] URL: [email or web address] Date: 07/30/2022 Prepared by: Shearon Balo   Exercises - Supine Active Straight Leg Raise  - 1 x daily - 7 x weekly - 3 sets - 10 reps - Hooklying Isometric Clamshell  - 1 x daily - 7 x weekly - 3 sets - 10 reps - Supine Hip Adduction Isometric with Ball  - 1 x daily - 7 x weekly - 2 sets - 10 reps - 10'' hold   ASTERISK SIGNS     Asterisk Signs Eval (07/30/2022)  9/28 08/21/2022         ASLR=PSLR no            Pain max 8/10 5/10           30'' STS 6x w/ UE support    6x without UE support        Hip ext Sig limitation                              TREATMENT 10/25:  Therapeutic Exercise: - nu-step L6 82mwhile taking subjective and planning session with patient - Hip flexor + light RF stretch EOM - 2' ea - supine bridge - partial ROM - 3x10 d/c'd - LTR - 20x -  Sidelying hip abduction 2x10 BIL - alternating SLR from foam roller - 2x10 ea - hip adduction with pilates ring in HL - 2-3'' hold - 2x10 - P-ball abdominal contraction - 3'' hold - 2x10 - STS - 15# - 3x10  [facility] Adult PT Treatment:                                                DATE: 08/21/2022 Therapeutic Exercise: Standing abdominal press-down into red physioball at table 4x30 seconds Mini-squat side steps with 7# cable to waist attachment x5 walkouts (roughly 10 steps each walkout) BIL Sidelying lumbar open books x10 BIL Sidelying hip abduction 2x10 BIL 15# kettlebell dead lift in front of table 3x10 Manual Therapy: N/A Neuromuscular re-ed: N/A Therapeutic Activity: N/A Modalities: N/A Self Care: N/A   [facility] Adult PT Treatment:                                                DATE: 08/13/2022 Therapeutic Exercise: 10# kettlebell dead lift in front of table 3x8 Supine with legs off end of table with alternating hip flexion and subsequent slow lowering with 2# ankle weights 2x10 BIL PT-assisted Thomas stretch x61mn BIL Hooklying mini curl ups  3x10 Hooklying LTR 2x10 Standing hip abduction with 3# cable to ankle 2x10 BIL Manual Therapy: N/A Neuromuscular re-ed: N/A Therapeutic Activity: N/A Modalities: N/A Self Care: N/A   TREATMENT 9/28:  Therapeutic Exercise: - nu-step L5 552mhile taking subjective and planning session with patient - Hip flexor + light RF stretch EOM - 2' ea - supine bridge - partial ROM - x10 d/c'd d/t L posterior flank pain - LTR - 20x - alternating SLR from foam roller - 2x10 ea - hip adduction in HL - 2-3'' hold - 2x10 - P-ball abdominal contraction - 3'' hold - 2x10   ASSESSMENT:   CLINICAL IMPRESSION: MaShameckaolerated session well with no adverse reaction.  We continued core and hip strengthening to good effect.  Rhylen fatigues, particularly with SLR and abdominal contractions, but is able to complete these with good form with mod cuing.  We will continue to progress as able.     OBJECTIVE IMPAIRMENTS: Pain, hip and core strength, balance   ACTIVITY LIMITATIONS: bending, lifting, squatting   PERSONAL FACTORS: See medical history and pertinent history       GOALS:     SHORT TERM GOALS: Target date: 08/20/2022   MaJerichoill be >75% HEP compliant to improve carryover between sessions and facilitate independent management of condition   Evaluation (07/30/2022): ongoing 08/13/2022: Pt reports daily HEP adherence Goal status: ACHIEVED     LONG TERM GOALS: Target date: 09/24/2022   MaTreyill improve FOTO score to 59 as a proxy for functional improvement   Evaluation/Baseline (07/30/2022): 52 Goal status: INITIAL     2.  MaOtishaill improve 3066 STS (MCID 2) to >/= 8x (w/ UE?: Y) to show improved LE strength and improved transfers    Evaluation/Baseline (07/30/2022): 6x  w/ UE? Y 08/21/2022: x6 WITHOUT UE support Goal status: IN PROGRESS     3.  MaMikenziill be able to stand for >20'' in semi tandem stance, to show a significant improvement in balance in order to  reduce  fall risk    Evaluation/Baseline (07/30/2022):   Progressive balance screen (highest level completed for >/= 10''):   Feet together: 10'' Semi Tandem: R in rear 5'', L in rear 6'' Goal status: INITIAL     4.  Bettyjo will self report >/= 50% decrease in pain from evaluation    Evaluation/Baseline (07/30/2022): 8/10 max pain Goal status: INITIAL       PLAN: PT FREQUENCY: 1-2x/week   PT DURATION: 8 weeks (Ending 09/24/2022)   PLANNED INTERVENTIONS: Therapeutic exercises, Aquatic therapy, Therapeutic activity, Neuro Muscular re-education, Gait training, Patient/Family education, Joint mobilization, Dry Needling, Electrical stimulation, Spinal mobilization and/or manipulation, Moist heat, Taping, Vasopneumatic device, Ionotophoresis '4mg'$ /ml Dexamethasone, and Manual therapy   PLAN FOR NEXT SESSION: progressive hip/core/LE strengthening, balance, gait   Kevan Ny Lelia Jons PT 09/03/22 4:13 PM

## 2022-09-04 ENCOUNTER — Ambulatory Visit: Payer: Medicare Other | Admitting: Physical Therapy

## 2022-09-04 ENCOUNTER — Encounter: Payer: Self-pay | Admitting: Physical Therapy

## 2022-09-04 DIAGNOSIS — M6281 Muscle weakness (generalized): Secondary | ICD-10-CM

## 2022-09-04 DIAGNOSIS — R2681 Unsteadiness on feet: Secondary | ICD-10-CM | POA: Diagnosis not present

## 2022-09-04 DIAGNOSIS — M545 Low back pain, unspecified: Secondary | ICD-10-CM

## 2022-09-04 NOTE — Therapy (Signed)
OUTPATIENT PHYSICAL THERAPY TREATMENT NOTE   Patient Name: Karla Willis MRN: [id number] DOB:04/14/1935, 87 y.o., female Today's Date: 09/04/2022  PCP: Lauree Chandler, NP   REFERRING PROVIDER: Lauree Chandler, NP   PT End of Session - 09/04/22 1130     Visit Number 6    Date for PT Re-Evaluation 09/24/22    Authorization Type [employer] MCR    Authorization Time Period FOTO v6, v10, kx mod v15    Progress Note Due on Visit 10    PT Start Time 1130    PT Stop Time 1211    PT Time Calculation (min) 41 min    Activity Tolerance Patient tolerated treatment well    Behavior During Therapy WFL for tasks assessed/performed               Past Medical History:  Diagnosis Date   Anemia    Arthritis    Chronic idiopathic gout    06-13-2021  per pt last flare-up  > 2 yrs ago,  right foot   CKD (chronic kidney disease), stage II    Ductal carcinoma in situ (DCIS) of right breast 10/24/2013   right upper inner- DCIS, 12-16-2013  s/p right breast lumpectomy with two re-excision's,  no chemo/ radiation and completed anti-estrogen therapy 06/ 2020;  oncologist-- dr [name]--  released pt per lov note in epic 05-01-2020   Endometrial polyp    Essential tremor    occasional hand tremor   Full dentures    GERD (gastroesophageal reflux disease)    rarely takes tums   Hiatal hernia    History of 2019 novel coronavirus disease (COVID-19)    07/ 2020  w/ moderate symptoms that resolved ,  and 2021 w/ mild symptoms;  no hospitalzation for with time but has chronic occasional dry cough since   History of temporal artery biopsy    Hypertension    Hypothyroidism, postsurgical 1970   followed by pcp   OAB (overactive bladder)    Osteopenia    PMB (postmenopausal bleeding)    Pneumonia    Type 2 diabetes mellitus ([place])    followed by pcp   (06-13-2021  per pt only checks once per week, fasting sugar-- 101-120)   Wears hearing aid in both ears    per pt only aid working is right  side, but does not wear all the time   Past Surgical History:  Procedure Laterality Date   ARTERY BIOPSY Bilateral 11/07/2021   Procedure: BILATERAL TEMPORAL ARTERY BIOPSY;  Surgeon: [name], MD;  Location: [facility];  Service: Vascular;  Laterality: Bilateral;   BREAST BIOPSY Right 10/26/2013   BREAST LUMPECTOMY WITH NEEDLE LOCALIZATION Right 12/16/2013   Procedure: BREAST LUMPECTOMY WITH NEEDLE LOCALIZATION;  Surgeon: Edward Jolly, MD;  Location: [place];  Service: General;  Laterality: Right;   CATARACT EXTRACTION W/ INTRAOCULAR LENS IMPLANT Bilateral 2019   CHOLECYSTECTOMY OPEN  1970   COLONOSCOPY     DILATATION & CURETTAGE/HYSTEROSCOPY WITH MYOSURE N/A 06/18/2021   Procedure: DILATATION & CURETTAGE/HYSTEROSCOPY WITH MYOSURE RESECTION FOR POLYPS;  Surgeon: [name], MD;  Location: [place];  Service: Gynecology;  Laterality: N/A;   DILATION AND CURETTAGE OF UTERUS  09/2000   '@[facility]'  by dr [name]   FINGER SURGERY     right hand-   RE-EXCISION OF BREAST LUMPECTOMY Right 01/30/2014   Procedure: RE-EXCISION OF BREAST LUMPECTOMY;  Surgeon: Edward Jolly, MD;  Location: [facility];  Service: General;  Laterality: Right;   RE-EXCISION OF BREAST LUMPECTOMY Right 03/06/2014   Procedure: RE-EXCISION OF BREAST LUMPECTOMY;  Surgeon: Edward Jolly, MD;  Location: [place];  Service: General;  Laterality: Right;   THYROID LOBECTOMY  1970   unilateral   TOTAL SHOULDER ARTHROPLASTY Left 03/04/2011   '@[facility]'    TOTAL SHOULDER ARTHROPLASTY Right 05/21/2016   Procedure: RIGHT TOTAL SHOULDER ARTHROPLASTY;  Surgeon: Ninetta Lights, MD;  Location: [place];  Service: Orthopedics;  Laterality: Right;   TUBAL LIGATION     yrs ago   Patient Active Problem List   Diagnosis Date Noted   OSA (obstructive sleep apnea) 03/25/2022   Daytime sleepiness 02/03/2022   Morbid obesity ([place]) 02/03/2022   Snoring  02/14/2020   Insomnia 02/14/2020   History of breast cancer 05/20/2017   Idiopathic gout 05/20/2017   Hyperlipidemia LDL goal <100 05/20/2017   Bilateral edema of lower extremity 05/20/2017   Dyspnea on exertion 05/25/2016   Anemia 05/25/2016   Localized primary osteoarthritis of right shoulder region 05/21/2016   Osteoarthritis, multiple sites 03/09/2015   Hypothyroidism 03/09/2015   Goiter 03/09/2015   Essential hypertension, benign 03/09/2015   Diabetes mellitus, type 2 ([place]) 03/09/2015   DCIS (ductal carcinoma in situ) of breast 11/11/2013    THERAPY DIAG:  Low back pain, unspecified back pain laterality, unspecified chronicity, unspecified whether sciatica present  Muscle weakness  Unsteadiness on feet  REFERRING DIAG: Chronic left-sided low back pain with left-sided sciatica [M54.42, G89.29]  PERTINENT HISTORY: DM TII, Dyspnea on exertion, anemia, osteoporosis    PRECAUTIONS/RESTRICTIONS:   Osteoporosis  SUBJECTIVE:  Pt reports that her back is feeling good and she anticipates being ready for D/C next visit.  Pain:  Are you having pain? Yes Pain location: midline low back pain with radiating pain posterior L LE NPRS scale:  current 0/10  Aggravating factors: bending, cleaning, sitting for long periods Relieving factors: lying down Pain description: intermittent, dull, and aching Stage: Chronic Stability: staying the same 24 hour pattern: stiff and worse in the morning   OBJECTIVE: (objective measures completed at initial evaluation unless otherwise dated)  GENERAL OBSERVATION/GAIT:           Slow antalgic gait, using SPC   SENSATION:          Light touch: Appears intact   MUSCLE LENGTH: Hamstrings: Right no restriction; Left no restriction ASLR: Right ASLR significantly reduced compared to PSLR ; Left ASLR significantly reduced compared to PSLR  Thomas test: Right significant restriction; Left significant restriction         LUMBAR AROM   AROM  AROM  07/30/2022  Flexion WNL  Extension limited by 75%  Right lateral flexion limited by 50%  Left lateral flexion limited by 50%  Right rotation limited by 25%  Left rotation limited by 25%    (Blank rows = not tested)   LE MMT:   MMT Right 07/30/2022 Left 07/30/2022  Hip flexion (L2, L3) 3 3  Knee extension (L3) 3+ 3+  Knee flexion 3+ 3+  Hip abduction 2+ 2+  Hip extension NT NT  Hip external rotation      Hip internal rotation      Hip adduction      Ankle dorsiflexion (L4)      Ankle plantarflexion (S1)      Ankle inversion      Ankle eversion      Great Toe ext (L5)  Grossly        (Blank rows = not tested, score listed is out of 5 possible points.  N = WNL, D = diminished, C = clear for gross weakness with myotome testing, * = concordant pain with testing)     LE ROM:   ROM Right 07/30/2022 Left 07/30/2022  Hip flexion      Hip extension      Hip abduction      Hip adduction      Hip internal rotation      Hip external rotation      Knee flexion      Knee extension      Ankle dorsiflexion      Ankle plantarflexion      Ankle inversion      Ankle eversion         (Blank rows = not tested, N = WNL, * = concordant pain with testing)   Functional Tests   Eval (07/30/2022) 08/21/2022     30'' STS: 6x  UE used? Y      Progressive balance screen (highest level completed for >/= 10''):   Feet together: 10'' Semi Tandem: R in rear 5'', L in rear 6''                                                                                                 LUMBAR SPECIAL TESTS:  Straight leg raise: L (-), R (-) Slump: L (-), R (-)   PATIENT SURVEYS:  FOTO 52 -> 59     TODAY'S TREATMENT  Creating, reviewing, and completing below HEP   PATIENT EDUCATION:  POC, diagnosis, prognosis, HEP, and outcome measures.  Pt educated via explanation, demonstration, and handout (HEP).  Pt confirms understanding verbally.    HOME EXERCISE PROGRAM: Access  Code: [email or web address] URL: [email or web address] Date: 07/30/2022 Prepared by: Shearon Balo   Exercises - Supine Active Straight Leg Raise  - 1 x daily - 7 x weekly - 3 sets - 10 reps - Hooklying Isometric Clamshell  - 1 x daily - 7 x weekly - 3 sets - 10 reps - Supine Hip Adduction Isometric with Ball  - 1 x daily - 7 x weekly - 2 sets - 10 reps - 10'' hold   ASTERISK SIGNS     Asterisk Signs Eval (07/30/2022)  9/28 08/21/2022   10/26      ASLR=PSLR no     =       Pain max 8/10 5/10    2/10       30'' STS 6x w/ UE support    6x without UE support  9x w/ UE, 6x w/o UE      Hip ext Sig limitation                              TREATMENT 10/26:  Therapeutic Exercise: - nu-step L6 34mwhile taking subjective and planning session with patient - supine bridge - partial ROM - 3x10 - LTR - 20x - Sidelying hip abduction 3x10 BIL - alternating  Clam black TB - 2x10 - alternating SLR from foam roller - 3x10 ea - hip adduction with pilates ring in HL - 2-3'' hold - 2x10 - P-ball abdominal contraction - 3'' hold - 2x10 - STS - 15# - 3x10  Therapeutic Activity - collecting information for goals, checking progress, and reviewing with patient  [facility] Adult PT Treatment:                                                DATE: 08/21/2022 Therapeutic Exercise: Standing abdominal press-down into red physioball at table 4x30 seconds Mini-squat side steps with 7# cable to waist attachment x5 walkouts (roughly 10 steps each walkout) BIL Sidelying lumbar open books x10 BIL Sidelying hip abduction 2x10 BIL 15# kettlebell dead lift in front of table 3x10 Manual Therapy: N/A Neuromuscular re-ed: N/A Therapeutic Activity: N/A Modalities: N/A Self Care: N/A   [facility] Adult PT Treatment:                                                DATE: 08/13/2022 Therapeutic Exercise: 10# kettlebell dead lift in front of table 3x8 Supine with legs off end of table with alternating hip flexion and  subsequent slow lowering with 2# ankle weights 2x10 BIL PT-assisted Thomas stretch x46mn BIL Hooklying mini curl ups 3x10 Hooklying LTR 2x10 Standing hip abduction with 3# cable to ankle 2x10 BIL Manual Therapy: N/A Neuromuscular re-ed: N/A Therapeutic Activity: N/A Modalities: N/A Self Care: N/A   TREATMENT 9/28:  Therapeutic Exercise: - nu-step L5 527mhile taking subjective and planning session with patient - Hip flexor + light RF stretch EOM - 2' ea - supine bridge - partial ROM - x10 d/c'd d/t L posterior flank pain - LTR - 20x - alternating SLR from foam roller - 2x10 ea - hip adduction in HL - 2-3'' hold - 2x10 - P-ball abdominal contraction - 3'' hold - 2x10   ASSESSMENT:   CLINICAL IMPRESSION: MaNithilaolerated session well with no adverse reaction.  We continued core and hip strengthening to good effect.  On re-check, MaJakaias met many of her goals and has minimal pain at this point.  We will plan on D/C next visit barring any significant change in status.     OBJECTIVE IMPAIRMENTS: Pain, hip and core strength, balance   ACTIVITY LIMITATIONS: bending, lifting, squatting   PERSONAL FACTORS: See medical history and pertinent history       GOALS:     SHORT TERM GOALS: Target date: 08/20/2022   MaKylarill be >75% HEP compliant to improve carryover between sessions and facilitate independent management of condition   Evaluation (07/30/2022): ongoing 08/13/2022: Pt reports daily HEP adherence Goal status: ACHIEVED     LONG TERM GOALS: Target date: 09/24/2022   MaAanyaill improve FOTO score to 59 as a proxy for functional improvement   Evaluation/Baseline (07/30/2022): 52 Goal status: INITIAL     2.  MaSerenaill improve 3033 STS (MCID 2) to >/= 8x (w/ UE?: Y) to show improved LE strength and improved transfers    Evaluation/Baseline (07/30/2022): 6x  w/ UE? Y 08/21/2022: x6 WITHOUT UE support 10/26: x6 no UE, 9x w/ UE Goal status: MET     3.  Wadie will be able to stand for >20'' in semi tandem stance, to show a significant improvement in balance in order to reduce fall risk    Evaluation/Baseline (07/30/2022):   Progressive balance screen (highest level completed for >/= 10''):   Feet together: 10'' Semi Tandem: R in rear 5'', L in rear 6'' 10/26: R in rear 20'', L in rear 20'' Goal status: MET     4.  Adelei will self report >/= 50% decrease in pain from evaluation    Evaluation/Baseline (07/30/2022): 8/10 max pain 10/26: 100% improved Goal status: MET       PLAN: PT FREQUENCY: 1-2x/week   PT DURATION: 8 weeks (Ending 09/24/2022)   PLANNED INTERVENTIONS: Therapeutic exercises, Aquatic therapy, Therapeutic activity, Neuro Muscular re-education, Gait training, Patient/Family education, Joint mobilization, Dry Needling, Electrical stimulation, Spinal mobilization and/or manipulation, Moist heat, Taping, Vasopneumatic device, Ionotophoresis 28m/ml Dexamethasone, and Manual therapy   PLAN FOR NEXT SESSION: progressive hip/core/LE strengthening, balance, gait   KKevan NyReinhartsen PT 09/04/22 12:11 PM

## 2022-09-08 DIAGNOSIS — G4733 Obstructive sleep apnea (adult) (pediatric): Secondary | ICD-10-CM | POA: Diagnosis not present

## 2022-09-09 ENCOUNTER — Ambulatory Visit: Payer: Medicare Other | Admitting: Physical Therapy

## 2022-09-09 ENCOUNTER — Encounter: Payer: Self-pay | Admitting: Physical Therapy

## 2022-09-09 DIAGNOSIS — M6281 Muscle weakness (generalized): Secondary | ICD-10-CM

## 2022-09-09 DIAGNOSIS — R2681 Unsteadiness on feet: Secondary | ICD-10-CM | POA: Diagnosis not present

## 2022-09-09 DIAGNOSIS — M545 Low back pain, unspecified: Secondary | ICD-10-CM

## 2022-09-09 NOTE — Therapy (Addendum)
PHYSICAL THERAPY DISCHARGE SUMMARY  Visits from Start of Care: 7  Current functional level related to goals / functional outcomes: See assessment/goals   Remaining deficits: See assessment/goals   Education / Equipment: HEP and D/C plans  Patient agrees to discharge. Patient goals were partially met. Patient is being discharged due to being pleased with the current functional level.   Patient Name: Karla Willis MRN: [id number] DOB:[birth year], 87 y.o., female Today's Date: 09/09/2022  PCP: Lauree Chandler, NP   REFERRING PROVIDER: Lauree Chandler, NP   PT End of Session - 09/09/22 1132     Visit Number 7    Date for PT Re-Evaluation 09/24/22    Authorization Type [employer] MCR    Authorization Time Period FOTO v6, v10, kx mod v15    Progress Note Due on Visit 10    PT Start Time 1130    PT Stop Time 1212    PT Time Calculation (min) 42 min    Activity Tolerance Patient tolerated treatment well    Behavior During Therapy WFL for tasks assessed/performed               Past Medical History:  Diagnosis Date   Anemia    Arthritis    Chronic idiopathic gout    06-13-2021  per pt last flare-up  > 2 yrs ago,  right foot   CKD (chronic kidney disease), stage II    Ductal carcinoma in situ (DCIS) of right breast 10/24/2013   right upper inner- DCIS, 12-16-2013  s/p right breast lumpectomy with two re-excision's,  no chemo/ radiation and completed anti-estrogen therapy 06/ 2020;  oncologist-- dr [name]--  released pt per lov note in epic 05-01-2020   Endometrial polyp    Essential tremor    occasional hand tremor   Full dentures    GERD (gastroesophageal reflux disease)    rarely takes tums   Hiatal hernia    History of 2019 novel coronavirus disease (COVID-19)    07/ 2020  w/ moderate symptoms that resolved ,  and 2021 w/ mild symptoms;  no hospitalzation for with time but has chronic occasional dry cough since   History of temporal artery biopsy     Hypertension    Hypothyroidism, postsurgical 1970   followed by pcp   OAB (overactive bladder)    Osteopenia    PMB (postmenopausal bleeding)    Pneumonia    Type 2 diabetes mellitus ([place])    followed by pcp   (06-13-2021  per pt only checks once per week, fasting sugar-- 101-120)   Wears hearing aid in both ears    per pt only aid working is right side, but does not wear all the time   Past Surgical History:  Procedure Laterality Date   ARTERY BIOPSY Bilateral 11/07/2021   Procedure: BILATERAL TEMPORAL ARTERY BIOPSY;  Surgeon: [name], MD;  Location: [facility];  Service: Vascular;  Laterality: Bilateral;   BREAST BIOPSY Right 10/26/2013   BREAST LUMPECTOMY WITH NEEDLE LOCALIZATION Right 12/16/2013   Procedure: BREAST LUMPECTOMY WITH NEEDLE LOCALIZATION;  Surgeon: Edward Jolly, MD;  Location: [place];  Service: General;  Laterality: Right;   CATARACT EXTRACTION W/ INTRAOCULAR LENS IMPLANT Bilateral 2019   CHOLECYSTECTOMY OPEN  1970   COLONOSCOPY     DILATATION & CURETTAGE/HYSTEROSCOPY WITH MYOSURE N/A 06/18/2021   Procedure: DILATATION & CURETTAGE/HYSTEROSCOPY WITH MYOSURE RESECTION FOR POLYPS;  Surgeon: [name], MD;  Location: [place];  Service: Gynecology;  Laterality: N/A;   DILATION AND CURETTAGE OF UTERUS  09/2000   _0  by dr [name]   FINGER SURGERY     right hand-   RE-EXCISION OF BREAST LUMPECTOMY Right 01/30/2014   Procedure: RE-EXCISION OF BREAST LUMPECTOMY;  Surgeon: Edward Jolly, MD;  Location: [place];  Service: General;  Laterality: Right;   RE-EXCISION OF BREAST LUMPECTOMY Right 03/06/2014   Procedure: RE-EXCISION OF BREAST LUMPECTOMY;  Surgeon: Edward Jolly, MD;  Location: [place];  Service: General;  Laterality: Right;   THYROID LOBECTOMY  1970   unilateral   TOTAL SHOULDER ARTHROPLASTY Left 03/04/2011   _1    TOTAL SHOULDER ARTHROPLASTY Right  05/21/2016   Procedure: RIGHT TOTAL SHOULDER ARTHROPLASTY;  Surgeon: Ninetta Lights, MD;  Location: [place];  Service: Orthopedics;  Laterality: Right;   TUBAL LIGATION     yrs ago   Patient Active Problem List   Diagnosis Date Noted   OSA (obstructive sleep apnea) 03/25/2022   Daytime sleepiness 02/03/2022   Morbid obesity ([place]) 02/03/2022   Snoring 02/14/2020   Insomnia 02/14/2020   History of breast cancer 05/20/2017   Idiopathic gout 05/20/2017   Hyperlipidemia LDL goal <100 05/20/2017   Bilateral edema of lower extremity 05/20/2017   Dyspnea on exertion 05/25/2016   Anemia 05/25/2016   Localized primary osteoarthritis of right shoulder region 05/21/2016   Osteoarthritis, multiple sites 03/09/2015   Hypothyroidism 03/09/2015   Goiter 03/09/2015   Essential hypertension, benign 03/09/2015   Diabetes mellitus, type 2 ([place]) 03/09/2015   DCIS (ductal carcinoma in situ) of breast 11/11/2013    THERAPY DIAG:  Low back pain, unspecified back pain laterality, unspecified chronicity, unspecified whether sciatica present  Muscle weakness  Unsteadiness on feet  REFERRING DIAG: Chronic left-sided low back pain with left-sided sciatica [M54.42, G89.29]  PERTINENT HISTORY: DM TII, Dyspnea on exertion, anemia, osteoporosis    PRECAUTIONS/RESTRICTIONS:   Osteoporosis  SUBJECTIVE:  Pt reports that she is doing well, has little pain, and is ready for D/C today.  Pain:  Are you having pain? Yes Pain location: midline low back pain with radiating pain posterior L LE NPRS scale:  current 0/10  Aggravating factors: bending, cleaning, sitting for long periods Relieving factors: lying down Pain description: intermittent, dull, and aching Stage: Chronic Stability: staying the same 24 hour pattern: stiff and worse in the morning   OBJECTIVE: (objective measures completed at initial evaluation unless otherwise dated)  GENERAL OBSERVATION/GAIT:           Slow antalgic gait, using  SPC   SENSATION:          Light touch: Appears intact   MUSCLE LENGTH: Hamstrings: Right no restriction; Left no restriction ASLR: Right ASLR significantly reduced compared to PSLR ; Left ASLR significantly reduced compared to PSLR  Thomas test: Right significant restriction; Left significant restriction         LUMBAR AROM   AROM AROM  07/30/2022  Flexion WNL  Extension limited by 75%  Right lateral flexion limited by 50%  Left lateral flexion limited by 50%  Right rotation limited by 25%  Left rotation limited by 25%    (Blank rows = not tested)   LE MMT:   MMT Right 07/30/2022 Left 07/30/2022  Hip flexion (L2, L3) 3 3  Knee extension (L3) 3+ 3+  Knee flexion 3+ 3+  Hip abduction 2+ 2+  Hip extension NT NT  Hip external rotation  Hip internal rotation      Hip adduction      Ankle dorsiflexion (L4)      Ankle plantarflexion (S1)      Ankle inversion      Ankle eversion      Great Toe ext (L5)      Grossly        (Blank rows = not tested, score listed is out of 5 possible points.  N = WNL, D = diminished, C = clear for gross weakness with myotome testing, * = concordant pain with testing)     LE ROM:   ROM Right 07/30/2022 Left 07/30/2022  Hip flexion      Hip extension      Hip abduction      Hip adduction      Hip internal rotation      Hip external rotation      Knee flexion      Knee extension      Ankle dorsiflexion      Ankle plantarflexion      Ankle inversion      Ankle eversion         (Blank rows = not tested, N = WNL, * = concordant pain with testing)   Functional Tests   Eval (07/30/2022) 08/21/2022     30'' STS: 6x  UE used? Y      Progressive balance screen (highest level completed for >/= 10''):   Feet together: 10'' Semi Tandem: R in rear 5'', L in rear 6''                                                                                                 LUMBAR SPECIAL TESTS:  Straight leg raise: L (-), R  (-) Slump: L (-), R (-)   PATIENT SURVEYS:  FOTO 52 -> 59     TODAY'S TREATMENT  Creating, reviewing, and completing below HEP   PATIENT EDUCATION:  POC, diagnosis, prognosis, HEP, and outcome measures.  Pt educated via explanation, demonstration, and handout (HEP).  Pt confirms understanding verbally.    HOME EXERCISE PROGRAM: Access Code: [email or web address] URL: [email or web address] Date: 07/30/2022 Prepared by: Shearon Balo   Exercises - Supine Active Straight Leg Raise  - 1 x daily - 7 x weekly - 3 sets - 10 reps - Hooklying Isometric Clamshell  - 1 x daily - 7 x weekly - 3 sets - 10 reps - Supine Hip Adduction Isometric with Ball  - 1 x daily - 7 x weekly - 2 sets - 10 reps - 10'' hold   ASTERISK SIGNS     Asterisk Signs Eval (07/30/2022)  9/28 08/21/2022   10/26      ASLR=PSLR no     =       Pain max 8/10 5/10    2/10       13'' STS 6x w/ UE support    6x without UE support  9x w/ UE, 6x w/o UE      Hip ext Sig limitation  TREATMENT 10/31:  Therapeutic Exercise: - nu-step L6 40mwhile taking subjective and planning session with patient - supine bridge - partial ROM - 3x10 - LTR - 20x - alternating Clam black TB - 2x10 - alternating SLR from foam roller - 3x10 ea - hip adduction with pilates ring in HL - 2-3'' hold - 2x10 - P-ball abdominal contraction - 3'' hold - 2x10 - STS - 15# - 3x10  Therapeutic Activity - collecting information for goals, checking progress, and reviewing with patient  [facility]                                                [facility]: 08/21/2022 Therapeutic Exercise: Standing abdominal press-down into red physioball at table 4x30 seconds Mini-squat side steps with 7# cable to waist attachment x5 walkouts (roughly 10 steps each walkout) BIL Sidelying lumbar open books x10 BIL Sidelying hip abduction 2x10 BIL 15# kettlebell dead lift in front of table 3x10 Manual  Therapy: N/A Neuromuscular re-ed: N/A Therapeutic Activity: N/A Modalities: N/A Self Care: N/A   [facility] Adult PT Treatment:                                                DATE: 08/13/2022 Therapeutic Exercise: 10# kettlebell dead lift in front of table 3x8 Supine with legs off end of table with alternating hip flexion and subsequent slow lowering with 2# ankle weights 2x10 BIL PT-assisted Thomas stretch x131m BIL Hooklying mini curl ups 3x10 Hooklying LTR 2x10 Standing hip abduction with 3# cable to ankle 2x10 BIL Manual Therapy: N/A Neuromuscular re-ed: N/A Therapeutic Activity: N/A Modalities: N/A Self Care: N/A   TREATMENT 9/28:  Therapeutic Exercise: - nu-step L5 38m71mile taking subjective and planning session with patient - Hip flexor + light RF stretch EOM - 2' ea - supine bridge - partial ROM - x10 d/c'd d/t L posterior flank pain - LTR - 20x - alternating SLR from foam roller - 2x10 ea - hip adduction in HL - 2-3'' hold - 2x10 - P-ball abdominal contraction - 3'' hold - 2x10   ASSESSMENT:   CLINICAL IMPRESSION: MarJEANELL MANGANs progressed well with therapy.  Improved impairments include: core and hip strength, pain, gait, balance.  Functional improvements include: transfers, housework, confidence in ambulation.  Progressions needed include: continued work at home with HEP.  Barriers to progress include: none.  Please see GOALS section for progress on short term and long term goals established at evaluation.  I recommend D/C home with HEP; pt agrees with plan.     OBJECTIVE IMPAIRMENTS: Pain, hip and core strength, balance   ACTIVITY LIMITATIONS: bending, lifting, squatting   PERSONAL FACTORS: See medical history and pertinent history       GOALS:     SHORT TERM GOALS: Target date: 08/20/2022   MarShellall be >75% HEP compliant to improve carryover between sessions and facilitate independent management of condition   Evaluation (07/30/2022):  ongoing 08/13/2022: Pt reports daily HEP adherence Goal status: ACHIEVED     LONG TERM GOALS: Target date: 09/24/2022   MarNicholll improve FOTO score to 59 as a proxy for functional improvement   Evaluation/Baseline (07/30/2022): 52 10/31 Goal status: Partially met  2.  Videl will improve 30'' STS (MCID 2) to >/= 8x (w/ UE?: Y) to show improved LE strength and improved transfers    Evaluation/Baseline (07/30/2022): 6x  w/ UE? Y 08/21/2022: x6 WITHOUT UE support 10/26: x6 no UE, 9x w/ UE Goal status: MET     3.  Cherise will be able to stand for >20'' in semi tandem stance, to show a significant improvement in balance in order to reduce fall risk    Evaluation/Baseline (07/30/2022):   Progressive balance screen (highest level completed for >/= 10''):   Feet together: 10'' Semi Tandem: R in rear 5'', L in rear 6'' 10/26: R in rear 20'', L in rear 20'' Goal status: MET     4.  Naudia will self report >/= 50% decrease in pain from evaluation    Evaluation/Baseline (07/30/2022): 8/10 max pain 10/26: 100% improved Goal status: MET       PLAN: PT FREQUENCY: 1-2x/week   PT DURATION: 8 weeks (Ending 09/24/2022)   PLANNED INTERVENTIONS: Therapeutic exercises, Aquatic therapy, Therapeutic activity, Neuro Muscular re-education, Gait training, Patient/Family education, Joint mobilization, Dry Needling, Electrical stimulation, Spinal mobilization and/or manipulation, Moist heat, Taping, Vasopneumatic device, Ionotophoresis 21m/ml Dexamethasone, and Manual therapy   PLAN FOR NEXT SESSION: progressive hip/core/LE strengthening, balance, gait   KMathis DadPT 09/09/22 12:13 PM

## 2022-09-15 ENCOUNTER — Other Ambulatory Visit: Payer: Self-pay | Admitting: Nurse Practitioner

## 2022-09-15 DIAGNOSIS — E119 Type 2 diabetes mellitus without complications: Secondary | ICD-10-CM

## 2022-09-15 DIAGNOSIS — I10 Essential (primary) hypertension: Secondary | ICD-10-CM

## 2022-09-19 ENCOUNTER — Ambulatory Visit: Payer: Medicare Other | Admitting: Nurse Practitioner

## 2022-10-09 DIAGNOSIS — G4733 Obstructive sleep apnea (adult) (pediatric): Secondary | ICD-10-CM | POA: Diagnosis not present

## 2022-10-10 ENCOUNTER — Ambulatory Visit: Payer: Medicare Other | Admitting: Nurse Practitioner

## 2022-10-17 ENCOUNTER — Other Ambulatory Visit: Payer: Self-pay | Admitting: Orthopedic Surgery

## 2022-10-17 DIAGNOSIS — E1122 Type 2 diabetes mellitus with diabetic chronic kidney disease: Secondary | ICD-10-CM

## 2022-10-20 DIAGNOSIS — E118 Type 2 diabetes mellitus with unspecified complications: Secondary | ICD-10-CM | POA: Diagnosis not present

## 2022-10-23 ENCOUNTER — Encounter: Payer: Self-pay | Admitting: Nurse Practitioner

## 2022-10-24 ENCOUNTER — Encounter: Payer: Medicare Other | Admitting: Nurse Practitioner

## 2022-10-24 VITALS — Ht 62.0 in

## 2022-10-24 DIAGNOSIS — Z23 Encounter for immunization: Secondary | ICD-10-CM

## 2022-10-28 NOTE — Progress Notes (Signed)
This encounter was created in error - please disregard.

## 2022-10-30 ENCOUNTER — Other Ambulatory Visit: Payer: Self-pay | Admitting: Nurse Practitioner

## 2022-10-30 DIAGNOSIS — E785 Hyperlipidemia, unspecified: Secondary | ICD-10-CM

## 2022-10-30 DIAGNOSIS — M7989 Other specified soft tissue disorders: Secondary | ICD-10-CM

## 2022-11-08 DIAGNOSIS — E118 Type 2 diabetes mellitus with unspecified complications: Secondary | ICD-10-CM | POA: Diagnosis not present

## 2022-11-08 DIAGNOSIS — G4733 Obstructive sleep apnea (adult) (pediatric): Secondary | ICD-10-CM | POA: Diagnosis not present

## 2022-11-11 DIAGNOSIS — E118 Type 2 diabetes mellitus with unspecified complications: Secondary | ICD-10-CM | POA: Diagnosis not present

## 2022-11-12 ENCOUNTER — Ambulatory Visit: Payer: Medicare Other | Admitting: Nurse Practitioner

## 2022-11-24 ENCOUNTER — Other Ambulatory Visit: Payer: Self-pay | Admitting: Nurse Practitioner

## 2022-11-24 DIAGNOSIS — M7989 Other specified soft tissue disorders: Secondary | ICD-10-CM

## 2022-11-25 ENCOUNTER — Encounter: Payer: Self-pay | Admitting: Nurse Practitioner

## 2022-11-26 ENCOUNTER — Other Ambulatory Visit: Payer: Self-pay | Admitting: Nurse Practitioner

## 2022-11-26 ENCOUNTER — Encounter: Payer: Self-pay | Admitting: Nurse Practitioner

## 2022-11-26 ENCOUNTER — Ambulatory Visit (INDEPENDENT_AMBULATORY_CARE_PROVIDER_SITE_OTHER): Payer: Medicare HMO | Admitting: Nurse Practitioner

## 2022-11-26 VITALS — BP 132/74 | HR 86 | Temp 97.5°F | Wt 247.6 lb

## 2022-11-26 DIAGNOSIS — J00 Acute nasopharyngitis [common cold]: Secondary | ICD-10-CM

## 2022-11-26 DIAGNOSIS — N182 Chronic kidney disease, stage 2 (mild): Secondary | ICD-10-CM | POA: Diagnosis not present

## 2022-11-26 DIAGNOSIS — M1A9XX0 Chronic gout, unspecified, without tophus (tophi): Secondary | ICD-10-CM

## 2022-11-26 DIAGNOSIS — I10 Essential (primary) hypertension: Secondary | ICD-10-CM | POA: Diagnosis not present

## 2022-11-26 DIAGNOSIS — E039 Hypothyroidism, unspecified: Secondary | ICD-10-CM | POA: Diagnosis not present

## 2022-11-26 DIAGNOSIS — E785 Hyperlipidemia, unspecified: Secondary | ICD-10-CM | POA: Diagnosis not present

## 2022-11-26 DIAGNOSIS — E119 Type 2 diabetes mellitus without complications: Secondary | ICD-10-CM | POA: Diagnosis not present

## 2022-11-26 DIAGNOSIS — G8929 Other chronic pain: Secondary | ICD-10-CM

## 2022-11-26 DIAGNOSIS — E1122 Type 2 diabetes mellitus with diabetic chronic kidney disease: Secondary | ICD-10-CM | POA: Diagnosis not present

## 2022-11-26 DIAGNOSIS — M5442 Lumbago with sciatica, left side: Secondary | ICD-10-CM | POA: Diagnosis not present

## 2022-11-26 DIAGNOSIS — Z6841 Body Mass Index (BMI) 40.0 and over, adult: Secondary | ICD-10-CM

## 2022-11-26 DIAGNOSIS — M5441 Lumbago with sciatica, right side: Secondary | ICD-10-CM

## 2022-11-26 DIAGNOSIS — M7989 Other specified soft tissue disorders: Secondary | ICD-10-CM

## 2022-11-26 MED ORDER — FLUTICASONE PROPIONATE 50 MCG/ACT NA SUSP: 1.0000 | Freq: Two times a day (BID) | NASAL | 0 refills | Status: AC

## 2022-11-26 MED ORDER — LOSARTAN POTASSIUM 50 MG PO TABS: ORAL_TABLET | ORAL | 1 refills | Status: DC

## 2022-11-26 MED ORDER — DULOXETINE HCL 20 MG PO CPEP: ORAL_CAPSULE | ORAL | 1 refills | Status: DC

## 2022-11-26 MED ORDER — FUROSEMIDE 20 MG PO TABS: ORAL_TABLET | ORAL | 1 refills | Status: DC

## 2022-11-26 MED ORDER — GLIPIZIDE ER 5 MG PO TB24: ORAL_TABLET | ORAL | 1 refills | Status: DC

## 2022-11-26 MED ORDER — ALLOPURINOL 100 MG PO TABS: ORAL_TABLET | ORAL | 1 refills | Status: DC

## 2022-11-26 MED ORDER — CETIRIZINE HCL 10 MG PO TABS: 10.0000 mg | ORAL_TABLET | Freq: Every day | ORAL | 11 refills | Status: DC

## 2022-11-26 MED ORDER — ATORVASTATIN CALCIUM 10 MG PO TABS: ORAL_TABLET | ORAL | 1 refills | Status: DC

## 2022-11-26 MED ORDER — LEVOTHYROXINE SODIUM 88 MCG PO TABS: ORAL_TABLET | ORAL | 1 refills | Status: DC

## 2022-11-26 MED ORDER — METFORMIN HCL 500 MG PO TABS: ORAL_TABLET | ORAL | 1 refills | Status: DC

## 2022-11-26 NOTE — Patient Instructions (Signed)
Use Flonase one spray in each nostril twice a day Use Saline spray as needed.  Continue mucinex to thin mucus as needed.  Use a humidifier at night to help with nasal inflammation as well.

## 2022-11-26 NOTE — Progress Notes (Signed)
Careteam: Patient Care Team: Lauree Chandler, NP as PCP - General (Geriatric Medicine) Nicholas Lose, MD as Consulting Physician (Hematology and Oncology) [facility], [place] as Consulting Physician (Orthopedic Surgery)  PLACE OF SERVICE:  [facility]  Advanced Directive information    Allergies  Allergen Reactions   Actos [Pioglitazone] Swelling   Hydrocodone Nausea Only and Other (See Comments)    "bloated"   Tramadol Hcl Itching   Codeine Rash   Neurontin [Gabapentin] Palpitations and Other (See Comments)    dizziness   Other Rash    Nuts--PECANS   Peanut-Containing Drug Products Rash    Chief Complaint  Patient presents with   Acute Visit    Sinus problems      HPI: Patient is a 88 y.o. female here with c/o of runny nose/congestion/itchy watery eyes x 2 weeks. Denies fevers, had some chills but temperature was normal. No headache or sore throat. Some sinus pressure in the front of her head. Productive cough, clear thing mucus. Sneezing often, eyes are watering thin clear fluid here in office. Has tried some mucinex, tylenol, and some sort of sinus medication of which she can not recall the name, but nothing seems to help. Some fatigue last week but better now. She has tested herself for covid about a week ago and the result was negative. No sick contacts. Denies exposure to plants, animals, new scents, detergents.   She maintains that she is active but does not intentionally exercise at this time. She cooks at home. What she cooks varies, sometimes it is fried. She occasionally has sweets and lemonade. She has been checking her fasting blood sugar every morning and it is between 90-100. Denies nocturia, more than normal, polyphagia, polydipsia. Denies blurry vision, sweats, shaking, headache, irritability.  She has been checking her BP at home but is unable to remember the range. Denies numbness, tingling, weakness, headache, swelling.  Denies joint  pain today. Back is doing well today. States she enjoyed the therapy and it helped a lot.   No constipation, feeling hot or cold, dry skin, palpitations, or depression.    Review of Systems:  Review of Systems  Constitutional:  Positive for chills and malaise/fatigue. Negative for fever and weight loss.       Some fatigue last week but better  HENT:  Positive for sinus pain. Negative for congestion, ear pain and sore throat.   Eyes:  Positive for discharge. Negative for blurred vision.       Itchy, watery eyes BIL  Respiratory:  Positive for cough and sputum production. Negative for shortness of breath and wheezing.        Thin clear mucus  Cardiovascular:  Negative for chest pain, palpitations and leg swelling.  Gastrointestinal:  Negative for abdominal pain, blood in stool, constipation, diarrhea, heartburn, nausea and vomiting.  Genitourinary:  Negative for dysuria, frequency, hematuria and urgency.  Musculoskeletal:  Negative for falls and joint pain.  Skin:  Negative for rash.  Neurological:  Negative for dizziness, tingling, weakness and headaches.  Endo/Heme/Allergies:  Negative for polydipsia.  Psychiatric/Behavioral:  Negative for depression. The patient is not nervous/anxious.     Past Medical History:  Diagnosis Date   Anemia    Arthritis    Chronic idiopathic gout    06-13-2021  per pt last flare-up  > 2 yrs ago,  right foot   CKD (chronic kidney disease), stage II    Ductal carcinoma in situ (DCIS) of right breast 10/24/2013  right upper inner- DCIS, 12-16-2013  s/p right breast lumpectomy with two re-excision's,  no chemo/ radiation and completed anti-estrogen therapy 06/ 2020;  oncologist-- dr [name]--  released pt per lov note in epic 05-01-2020   Endometrial polyp    Essential tremor    occasional hand tremor   Full dentures    GERD (gastroesophageal reflux disease)    rarely takes tums   Hiatal hernia    History of 2019 novel coronavirus disease (COVID-19)     07/ 2020  w/ moderate symptoms that resolved ,  and 2021 w/ mild symptoms;  no hospitalzation for with time but has chronic occasional dry cough since   History of temporal artery biopsy    Hypertension    Hypothyroidism, postsurgical 1970   followed by pcp   OAB (overactive bladder)    Osteopenia    PMB (postmenopausal bleeding)    Pneumonia    Type 2 diabetes mellitus ([place])    followed by pcp   (06-13-2021  per pt only checks once per week, fasting sugar-- 101-120)   Wears hearing aid in both ears    per pt only aid working is right side, but does not wear all the time   Past Surgical History:  Procedure Laterality Date   ARTERY BIOPSY Bilateral 11/07/2021   Procedure: BILATERAL TEMPORAL ARTERY BIOPSY;  Surgeon: [name], MD;  Location: [facility];  Service: Vascular;  Laterality: Bilateral;   BREAST BIOPSY Right 10/26/2013   BREAST LUMPECTOMY WITH NEEDLE LOCALIZATION Right 12/16/2013   Procedure: BREAST LUMPECTOMY WITH NEEDLE LOCALIZATION;  Surgeon: Edward Jolly, MD;  Location: [facility];  Service: General;  Laterality: Right;   CATARACT EXTRACTION W/ INTRAOCULAR LENS IMPLANT Bilateral 2019   CHOLECYSTECTOMY OPEN  1970   COLONOSCOPY     DILATATION & CURETTAGE/HYSTEROSCOPY WITH MYOSURE N/A 06/18/2021   Procedure: DILATATION & CURETTAGE/HYSTEROSCOPY WITH MYOSURE RESECTION FOR POLYPS;  Surgeon: [name], MD;  Location: [facility];  Service: Gynecology;  Laterality: N/A;   DILATION AND CURETTAGE OF UTERUS  09/2000   '@[facility]'$  by dr [name]   FINGER SURGERY     right hand-   RE-EXCISION OF BREAST LUMPECTOMY Right 01/30/2014   Procedure: RE-EXCISION OF BREAST LUMPECTOMY;  Surgeon: Edward Jolly, MD;  Location: [facility];  Service: General;  Laterality: Right;   RE-EXCISION OF BREAST LUMPECTOMY Right 03/06/2014   Procedure: RE-EXCISION OF BREAST LUMPECTOMY;  Surgeon: Edward Jolly, MD;  Location: [facility];  Service: General;  Laterality: Right;   THYROID LOBECTOMY  1970   unilateral   TOTAL SHOULDER ARTHROPLASTY Left 03/04/2011   '@[facility]'$    TOTAL SHOULDER ARTHROPLASTY Right 05/21/2016   Procedure: RIGHT TOTAL SHOULDER ARTHROPLASTY;  Surgeon: Ninetta Lights, MD;  Location: [facility];  Service: Orthopedics;  Laterality: Right;   TUBAL LIGATION     yrs ago   Social History:   reports that she has never smoked. She has never used smokeless tobacco. She reports that she does not drink alcohol and does not use drugs.  Family History  Problem Relation Age of Onset   Diabetes Mother    Arthritis Father    Seizures Sister    Colon cancer Sister    Pneumonia Daughter    Cancer Sister        breast   Breast cancer Sister    Multiple sclerosis Son        Deceased  Medications: Patient's Medications  New Prescriptions   No medications on file  Previous Medications   ACETAMINOPHEN (TYLENOL) 650 MG CR TABLET    Take 1,300 mg by mouth 2 (two) times daily as needed for pain.   ALLOPURINOL (ZYLOPRIM) 100 MG TABLET    TAKE ONE TABLET BY MOUTH DAILY AT 9AM for gout   ATORVASTATIN (LIPITOR) 10 MG TABLET    TAKE ONE TABLET BY MOUTH DAILY AT 9AM   CALCIUM-VITAMIN D PO    Take 1 tablet by mouth daily.   DICLOFENAC SODIUM (VOLTAREN) 1 % GEL    Apply 4 g topically 4 (four) times daily as needed.   DULOXETINE (CYMBALTA) 20 MG CAPSULE    TAKE 1 CAPSULE(20 MG) BY MOUTH DAILY   FUROSEMIDE (LASIX) 20 MG TABLET    TAKE ONE TABLET BY MOUTH DAILY AT 9AM   GLIPIZIDE (GLUCOTROL XL) 5 MG 24 HR TABLET    TAKE ONE TABLET BY MOUTH DAILY AT 9AM WITH BREAKFAST   LEVOTHYROXINE (SYNTHROID) 88 MCG TABLET    TAKE ONE TABLET BY MOUTH DAILY AT 8AM   LOSARTAN (COZAAR) 50 MG TABLET    TAKE ONE TABLET BY MOUTH DAILY AT 9AM   METFORMIN (GLUCOPHAGE) 500 MG TABLET    TAKE ONE TABLET BY MOUTH TWICE DAILY @ 9AM & 5PM WITH A MEAL   NYSTATIN CREAM (MYCOSTATIN)    Apply 1 Application topically 2 (two) times daily.    TRIAMCINOLONE CREAM (KENALOG) 0.1 %    APPLY EXTERNALLY TO THE AFFECTED AREA TWICE DAILY  Modified Medications   No medications on file  Discontinued Medications   No medications on file    Physical Exam:  Vitals:   11/26/22 1115  BP: 132/74  Pulse: 86  Temp: (!) 97.5 F (36.4 C)  TempSrc: Temporal  SpO2: 97%  Weight: 247 lb 9.6 oz (112.3 kg)   There is no height or weight on file to calculate BMI. Wt Readings from Last 3 Encounters:  07/18/22 249 lb 9.6 oz (113.2 kg)  05/16/22 250 lb 3.2 oz (113.5 kg)  03/25/22 253 lb 12.8 oz (115.1 kg)    Physical Exam Constitutional:      General: She is not in acute distress.    Appearance: Normal appearance.  HENT:     Right Ear: Tympanic membrane normal.     Left Ear: Tympanic membrane normal.     Nose: Congestion and rhinorrhea present.     Mouth/Throat:     Mouth: Mucous membranes are moist.  Eyes:     General:        Right eye: Discharge present.        Left eye: Discharge present.    Comments: Thin clear discharge  Cardiovascular:     Rate and Rhythm: Normal rate and regular rhythm.  Pulmonary:     Effort: No respiratory distress.     Breath sounds: Normal breath sounds.  Abdominal:     General: Bowel sounds are normal. There is no distension.     Palpations: Abdomen is soft. There is no mass.     Tenderness: There is no abdominal tenderness. There is no guarding.  Musculoskeletal:     Cervical back: Neck supple.  Lymphadenopathy:     Cervical: No cervical adenopathy.  Skin:    General: Skin is warm and dry.  Neurological:     Mental Status: She is alert and oriented to person, place, and time.  Psychiatric:        Mood  and Affect: Mood normal.     Labs reviewed: Basic Metabolic Panel: Recent Labs    02/10/22 1513 02/27/22 1403 04/21/22 1335 05/16/22 1513  NA 144 140  --  141  K 4.2 4.2  --  4.4  CL 109 105  --  106  CO2 24 28  --  25  GLUCOSE 143* 108*  --  90  BUN 10 12  --  16  CREATININE 0.97*  0.91  --  1.16*  CALCIUM 10.0 10.4  --  10.0  TSH 0.29* 0.24* 0.78 0.47   Liver Function Tests: Recent Labs    02/10/22 1513 05/16/22 1513  AST 14 15  ALT 9 9  BILITOT 0.4 0.4  PROT 6.4 6.4   No results for input(s): "LIPASE", "AMYLASE" in the last 8760 hours. No results for input(s): "AMMONIA" in the last 8760 hours. CBC: Recent Labs    05/16/22 1513  WBC 6.2  NEUTROABS 3,280  HGB 12.2  HCT 36.9  MCV 87.2  PLT 247   Lipid Panel: No results for input(s): "CHOL", "HDL", "LDLCALC", "TRIG", "CHOLHDL", "LDLDIRECT" in the last 8760 hours. TSH: Recent Labs    02/27/22 1403 04/21/22 1335 05/16/22 1513  TSH 0.24* 0.78 0.47   A1C: Lab Results  Component Value Date   HGBA1C 6.4 (H) 05/16/2022     Assessment/Plan 1. Acute rhinitis She can start zyrtec one tablet once a day and flonase one spray in each nostril twice a day. Saline spray as needed. Recommended humidifier for bedroom.  - cetirizine (ZYRTEC) 10 MG tablet; Take 1 tablet (10 mg total) by mouth daily.  Dispense: 30 tablet; Refill: 11 - fluticasone (FLONASE) 50 MCG/ACT nasal spray; Place 1 spray into both nostrils 2 (two) times daily.  Dispense: 16 g; Refill: 0  2. Essential hypertension, benign BP stable. Discussed diet and lifestyle modifications with patient. Continue losartan. - losartan (COZAAR) 50 MG tablet; TAKE ONE TABLET BY MOUTH DAILY AT 9AM  Dispense: 90 tablet; Refill: 1 - CMP with eGFR([facility]) - CBC with Differential/Platelet  3. Type 2 diabetes mellitus with stage 2 chronic kidney disease, without long-term current use of insulin (HCC) Well-controlled. Continue metformin in combination with diet and lifestyle changes and monitoring of fasting blood glucose at home.  - metFORMIN (GLUCOPHAGE) 500 MG tablet; TAKE ONE TABLET BY MOUTH TWICE DAILY @ 9AM & 5PM WITH A MEAL  Dispense: 180 tablet; Refill: 1 - glipiZIDE (GLUCOTROL XL) 5 MG 24 hr tablet; TAKE ONE TABLET BY MOUTH DAILY AT 9AM WITH BREAKFAST   Dispense: 90 tablet; Refill: 1 - Hemoglobin A1c  4. Class 3 severe obesity due to excess calories with serious comorbidity and body mass index (BMI) of 45.0 to 49.9 in adult [facility]) She is down a few pounds today. Encouraged diet and lifestyle modifications.  5. Acquired hypothyroidism Continue synthroid. - levothyroxine (SYNTHROID) 88 MCG tablet; TAKE ONE TABLET BY MOUTH DAILY AT 8AM  Dispense: 90 tablet; Refill: 1  6. Hyperlipidemia LDL goal <70 Encouraged diet and lifestyle modifications. Continue lipitor.  - atorvastatin (LIPITOR) 10 MG tablet; TAKE ONE TABLET BY MOUTH DAILY AT 9AM  Dispense: 90 tablet; Refill: 1 - allopurinol (ZYLOPRIM) 100 MG tablet; TAKE ONE TABLET BY MOUTH DAILY AT 9AM for gout  Dispense: 90 tablet; Refill: 1 - Lipid panel  7. Chronic gout without tophus, unspecified cause, unspecified site Stable. Continue allopurinol.  8. Leg swelling Continue lasix. Stable. - furosemide (LASIX) 20 MG tablet; TAKE ONE TABLET BY MOUTH DAILY AT 9AM  Dispense: 90 tablet; Refill: 1  9. Chronic bilateral low back pain with right-sided sciatica Improved. Did well with PT, continue cymbalta. - DULoxetine (CYMBALTA) 20 MG capsule; TAKE 1 CAPSULE(20 MG) BY MOUTH DAILY  Dispense: 90 capsule; Refill: 1   Return in about 6 months (around 05/27/2023) for routine follow up, labs with appt .  Student- Archer Asa O'Berry ACPCNP-S  I personally was present during the history, physical exam and medical decision-making activities of this service and have verified that the service and findings are accurately documented in the student's note Diana Armijo K. [place], [facility] [phone]

## 2022-11-27 LAB — COMPLETE METABOLIC PANEL WITH GFR
AG Ratio: 1.4 (calc) (ref 1.0–2.5)
ALT: 9 U/L (ref 6–29)
AST: 13 U/L (ref 10–35)
Albumin: 3.9 g/dL (ref 3.6–5.1)
Alkaline phosphatase (APISO): 59 U/L (ref 37–153)
BUN/Creatinine Ratio: 16 (calc) (ref 6–22)
BUN: 17 mg/dL (ref 7–25)
CO2: 26 mmol/L (ref 20–32)
Calcium: 10.4 mg/dL (ref 8.6–10.4)
Chloride: 105 mmol/L (ref 98–110)
Creat: 1.04 mg/dL — ABNORMAL HIGH (ref 0.60–0.95)
Globulin: 2.7 g/dL (calc) (ref 1.9–3.7)
Glucose, Bld: 174 mg/dL — ABNORMAL HIGH (ref 65–139)
Potassium: 4.3 mmol/L (ref 3.5–5.3)
Sodium: 140 mmol/L (ref 135–146)
Total Bilirubin: 0.4 mg/dL (ref 0.2–1.2)
Total Protein: 6.6 g/dL (ref 6.1–8.1)
eGFR: 53 mL/min/{1.73_m2} — ABNORMAL LOW (ref >=60)

## 2022-11-27 LAB — HEMOGLOBIN A1C
Hgb A1c MFr Bld: 6.3 % of total Hgb — ABNORMAL HIGH (ref <5.7)
Mean Plasma Glucose: 134 mg/dL
eAG (mmol/L): 7.4 mmol/L

## 2022-11-27 LAB — CBC WITH DIFFERENTIAL/PLATELET
Absolute Monocytes: 520 cells/uL (ref 200–950)
Basophils Absolute: 20 cells/uL (ref 0–200)
Basophils Relative: 0.4 %
Eosinophils Absolute: 170 cells/uL (ref 15–500)
Eosinophils Relative: 3.4 %
HCT: 35.7 % (ref 35.0–45.0)
Hemoglobin: 11.7 g/dL (ref 11.7–15.5)
Lymphs Abs: 1250 cells/uL (ref 850–3900)
MCH: 29.8 pg (ref 27.0–33.0)
MCHC: 32.8 g/dL (ref 32.0–36.0)
MCV: 90.8 fL (ref 80.0–100.0)
MPV: 11.3 fL (ref 7.5–12.5)
Monocytes Relative: 10.4 %
Neutro Abs: 3040 cells/uL (ref 1500–7800)
Neutrophils Relative %: 60.8 %
Platelets: 228 10*3/uL (ref 140–400)
RBC: 3.93 10*6/uL (ref 3.80–5.10)
RDW: 13.3 % (ref 11.0–15.0)
Total Lymphocyte: 25 %
WBC: 5 10*3/uL (ref 3.8–10.8)

## 2022-11-27 LAB — LIPID PANEL
Cholesterol: 123 mg/dL (ref <200)
HDL: 55 mg/dL (ref >=50)
LDL Cholesterol (Calc): 51 mg/dL (calc)
Non-HDL Cholesterol (Calc): 68 mg/dL (calc) (ref <130)
Total CHOL/HDL Ratio: 2.2 (calc) (ref <5.0)
Triglycerides: 88 mg/dL (ref <150)

## 2022-12-08 DIAGNOSIS — E118 Type 2 diabetes mellitus with unspecified complications: Secondary | ICD-10-CM | POA: Diagnosis not present

## 2022-12-11 DIAGNOSIS — E118 Type 2 diabetes mellitus with unspecified complications: Secondary | ICD-10-CM | POA: Diagnosis not present

## 2022-12-12 ENCOUNTER — Encounter: Payer: Self-pay | Admitting: Nurse Practitioner

## 2022-12-12 ENCOUNTER — Encounter: Payer: Medicare PPO | Admitting: Nurse Practitioner

## 2022-12-17 NOTE — Progress Notes (Signed)
err

## 2022-12-29 ENCOUNTER — Telehealth: Payer: Self-pay

## 2022-12-29 NOTE — Telephone Encounter (Signed)
Karla Chandler, NP gave me a completed Disability parking placard to call patient and advise it is ready for pick-up.  Patient is aware and plans to pick up tomorrow.  Side note: Copy was sent for scanning.   A VCC (Verification of Chronic Conditions) for was also attached with a note to fax to insurance company after Karla Chandler, NP signs. This form was given to the administrative staff to further process.

## 2023-01-07 DIAGNOSIS — E118 Type 2 diabetes mellitus with unspecified complications: Secondary | ICD-10-CM | POA: Diagnosis not present

## 2023-01-08 DIAGNOSIS — G4733 Obstructive sleep apnea (adult) (pediatric): Secondary | ICD-10-CM | POA: Diagnosis not present

## 2023-01-09 DIAGNOSIS — E118 Type 2 diabetes mellitus with unspecified complications: Secondary | ICD-10-CM | POA: Diagnosis not present

## 2023-02-05 ENCOUNTER — Encounter: Payer: Medicare Other | Admitting: Nurse Practitioner

## 2023-02-06 ENCOUNTER — Encounter: Payer: Medicare Other | Admitting: Nurse Practitioner

## 2023-02-10 ENCOUNTER — Encounter: Payer: Self-pay | Admitting: Adult Health

## 2023-02-10 ENCOUNTER — Ambulatory Visit (INDEPENDENT_AMBULATORY_CARE_PROVIDER_SITE_OTHER): Payer: Medicare HMO | Admitting: Adult Health

## 2023-02-10 VITALS — BP 120/70 | HR 89 | Temp 98.0°F | Ht 62.0 in | Wt 240.4 lb

## 2023-02-10 DIAGNOSIS — G4733 Obstructive sleep apnea (adult) (pediatric): Secondary | ICD-10-CM | POA: Diagnosis not present

## 2023-02-10 DIAGNOSIS — J301 Allergic rhinitis due to pollen: Secondary | ICD-10-CM | POA: Diagnosis not present

## 2023-02-10 DIAGNOSIS — J309 Allergic rhinitis, unspecified: Secondary | ICD-10-CM | POA: Insufficient documentation

## 2023-02-10 MED ORDER — IPRATROPIUM BROMIDE 0.03 % NA SOLN: 2.0000 | Freq: Three times a day (TID) | NASAL | 5 refills | Status: DC | PRN

## 2023-02-10 NOTE — Progress Notes (Signed)
Reviewed and agree with assessment/plan.   Chesley Mires, MD [facility] 02/10/2023, 12:12 PM Pager:  [phone]

## 2023-02-10 NOTE — Progress Notes (Signed)
@Patient  ID: Karla Willis, female    DOB: 07-Apr-1935, 88 y.o.   MRN: [id number]  Chief Complaint  Patient presents with   Follow-up    Referring provider: Lauree Chandler, NP  HPI: 88 year old female seen for sleep consult for snoring and daytime sleepiness found to have mild obstructive sleep apnea Medical significant for diabetes and hypertension  TEST/EVENTS :  Home sleep study Mar 14, 2022 showed mild obstructive sleep apnea AHI 8.7/hour with a SaO2 low at 83%   02/10/2023 Follow up: OSA  Patient returns for a 1 year follow-up.  Patient has underlying mild obstructive sleep apnea.  Patient says she has been doing good on CPAP up until the last few days  when her allergies have been increased with increased nasal congestion sinus drainage and drippy nose, sneezing. Feels nose runs like a faucet. No fever or discolored mucus.  Says that she feels that she benefits from CPAP with decreased daytime sleepiness.  CPAP download shows 80% compliance.  Daily average usage of 5 hours.  Patient is on auto CPAP 5 to 15 cm H2O.  Daily average pressure at 9.2 cm H2O.  AHI 2.7/hour. Uses nasal mask.   Allergies  Allergen Reactions   Actos [Pioglitazone] Swelling   Hydrocodone Nausea Only and Other (See Comments)    "bloated"   Tramadol Hcl Itching   Codeine Rash   Neurontin [Gabapentin] Palpitations and Other (See Comments)    dizziness   Other Rash    Nuts--PECANS   Peanut-Containing Drug Products Rash    Immunization History  Administered Date(s) Administered   Fluad Quad(high Dose 65+) 10/08/2020, 12/13/2021   Influenza, High Dose Seasonal PF 09/01/2019   Influenza,inj,Quad PF,6+ Mos 09/07/2015, 07/25/2016, 09/22/2017   Influenza-Unspecified 08/10/2014, 10/04/2018   PFIZER(Purple Top)SARS-COV-2 Vaccination 12/17/2019, 01/07/2020, 12/25/2020   Pfizer Covid-19 Vaccine Bivalent Booster 62yrs & up 03/08/2022   Pneumococcal Conjugate-13 01/14/2017   Pneumococcal-Unspecified  11/10/2013   Tdap 04/18/2016   Zoster Recombinat (Shingrix) 02/01/2022, 05/03/2022   Zoster, Live 04/18/2012    Past Medical History:  Diagnosis Date   Anemia    Arthritis    Chronic idiopathic gout    06-13-2021  per pt last flare-up  > 2 yrs ago,  right foot   CKD (chronic kidney disease), stage II    Ductal carcinoma in situ (DCIS) of right breast 10/24/2013   right upper inner- DCIS, 12-16-2013  s/p right breast lumpectomy with two re-excision's,  no chemo/ radiation and completed anti-estrogen therapy 06/ 2020;  oncologist-- dr [name]--  released pt per lov note in epic 05-01-2020   Endometrial polyp    Essential tremor    occasional hand tremor   Full dentures    GERD (gastroesophageal reflux disease)    rarely takes tums   Hiatal hernia    History of 2019 novel coronavirus disease (COVID-19)    07/ 2020  w/ moderate symptoms that resolved ,  and 2021 w/ mild symptoms;  no hospitalzation for with time but has chronic occasional dry cough since   History of temporal artery biopsy    Hypertension    Hypothyroidism, postsurgical 1970   followed by pcp   OAB (overactive bladder)    Osteopenia    PMB (postmenopausal bleeding)    Pneumonia    Type 2 diabetes mellitus    followed by pcp   (06-13-2021  per pt only checks once per week, fasting sugar-- 101-120)   Wears hearing aid in both ears  per pt only aid working is right side, but does not wear all the time    Tobacco History: Social History   Tobacco Use  Smoking Status Never  Smokeless Tobacco Never   Counseling given: Not Answered   Outpatient Medications Prior to Visit  Medication Sig Dispense Refill   acetaminophen (TYLENOL) 650 MG CR tablet Take 1,300 mg by mouth 2 (two) times daily as needed for pain.     allopurinol (ZYLOPRIM) 100 MG tablet TAKE ONE TABLET BY MOUTH DAILY AT 9AM for gout 90 tablet 1   atorvastatin (LIPITOR) 10 MG tablet TAKE ONE TABLET BY MOUTH DAILY AT 9AM 90 tablet 1    CALCIUM-VITAMIN D PO Take 1 tablet by mouth daily.     diclofenac sodium (VOLTAREN) 1 % GEL Apply 4 g topically 4 (four) times daily as needed. 100 g 0   DULoxetine (CYMBALTA) 20 MG capsule TAKE 1 CAPSULE(20 MG) BY MOUTH DAILY 90 capsule 1   furosemide (LASIX) 20 MG tablet TAKE ONE TABLET BY MOUTH DAILY AT 9AM 90 tablet 1   glipiZIDE (GLUCOTROL XL) 5 MG 24 hr tablet TAKE ONE TABLET BY MOUTH DAILY AT 9AM WITH BREAKFAST 90 tablet 1   levothyroxine (SYNTHROID) 88 MCG tablet TAKE ONE TABLET BY MOUTH DAILY AT 8AM 90 tablet 1   losartan (COZAAR) 50 MG tablet TAKE ONE TABLET BY MOUTH DAILY AT 9AM 90 tablet 1   metFORMIN (GLUCOPHAGE) 500 MG tablet TAKE ONE TABLET BY MOUTH TWICE DAILY @ 9AM & 5PM WITH A MEAL 180 tablet 1   nystatin cream (MYCOSTATIN) Apply 1 Application topically 2 (two) times daily. 30 g 0   triamcinolone cream (KENALOG) 0.1 % APPLY EXTERNALLY TO THE AFFECTED AREA TWICE DAILY 45 g 3   cetirizine (ZYRTEC) 10 MG tablet Take 1 tablet (10 mg total) by mouth daily. (Patient not taking: Reported on 02/10/2023) 30 tablet 11   fluticasone (FLONASE) 50 MCG/ACT nasal spray Place 1 spray into both nostrils 2 (two) times daily. (Patient not taking: Reported on 02/10/2023) 16 g 0   No facility-administered medications prior to visit.     Review of Systems:   Constitutional:   No  weight loss, night sweats,  Fevers, chills, fatigue, or  lassitude.  HEENT:   No headaches,  Difficulty swallowing,  Tooth/dental problems, or  Sore throat,                No sneezing, itching, ear ache,  +nasal congestion, post nasal drip,   CV:  No chest pain,  Orthopnea, PND, swelling in lower extremities, anasarca, dizziness, palpitations, syncope.   GI  No heartburn, indigestion, abdominal pain, nausea, vomiting, diarrhea, change in bowel habits, loss of appetite, bloody stools.   Resp: No shortness of breath with exertion or at rest.  No excess mucus, no productive cough,  No non-productive cough,  No coughing up  of blood.  No change in color of mucus.  No wheezing.  No chest wall deformity  Skin: no rash or lesions.  GU: no dysuria, change in color of urine, no urgency or frequency.  No flank pain, no hematuria   MS:  No joint pain or swelling.  No decreased range of motion.  No back pain.    Physical Exam  BP 120/70 (BP Location: Left Arm, Patient Position: Sitting, Cuff Size: Large)   Pulse 89   Temp 98 F (36.7 C) (Oral)   Ht 5\' 2"  (1.575 m)   Wt 240 lb 6.4 oz (109  kg)   SpO2 97%   BMI 43.97 kg/m   GEN: A/Ox3; pleasant , NAD, well nourished    HEENT:  [place]/AT,   NOSE-clear drainage, THROAT-clear, no lesions, no postnasal drip or exudate noted.   NECK:  Supple w/ fair ROM; no JVD; normal carotid impulses w/o bruits; no thyromegaly or nodules palpated; no lymphadenopathy.    RESP  Clear  P & A; w/o, wheezes/ rales/ or rhonchi. no accessory muscle use, no dullness to percussion  CARD:  RRR, no m/r/g, no peripheral edema, pulses intact, no cyanosis or clubbing.  GI:   Soft & nt; nml bowel sounds; no organomegaly or masses detected.   Musco: Warm bil, no deformities or joint swelling noted.   Neuro: alert, no focal deficits noted.    Skin: Warm, no lesions or rashes    Lab Results:  CBC   BMET   BNP No results found for: "BNP"  ProBNP No results found for: "PROBNP"  Imaging: No results found.        No data to display          No results found for: "NITRICOXIDE"      Assessment & Plan:   OSA (obstructive sleep apnea) Good control and compliance. Continue on CPAP At bedtime   Add rx for allergy symptoms  Plan  Patient Instructions  Begin Allegra 180mg  daily for 4 weeks and then as needed  Saline nasal spray Twice daily   Saline nasal gel At bedtime   Begin Flonase 2 puffs daily in am for 4 weeks and As needed   Try Atrovent Nasal spray Three times a day  As needed  drippy nose.  Try Chlorpheniramine 4mg  At bedtime  As needed  nasal drainage.    Wear CPAP all night long for at least 6hr each night  Do not drive if sleepy  Work on healthy weight loss.  Follow up in 1 year and As needed      Allergic rhinitis Flare with pollen - change to allegra . Add Flonase , chlor tabs and atrovent nasal   Plan  Patient Instructions  Begin Allegra 180mg  daily for 4 weeks and then as needed  Saline nasal spray Twice daily   Saline nasal gel At bedtime   Begin Flonase 2 puffs daily in am for 4 weeks and As needed   Try Atrovent Nasal spray Three times a day  As needed  drippy nose.  Try Chlorpheniramine 4mg  At bedtime  As needed  nasal drainage.   Wear CPAP all night long for at least 6hr each night  Do not drive if sleepy  Work on healthy weight loss.  Follow up in 1 year and As needed        Rexene Edison, NP 02/10/2023

## 2023-02-10 NOTE — Patient Instructions (Addendum)
Begin Allegra 180mg  daily for 4 weeks and then as needed  Saline nasal spray Twice daily   Saline nasal gel At bedtime   Begin Flonase 2 puffs daily in am for 4 weeks and As needed   Try Atrovent Nasal spray Three times a day  As needed  drippy nose.  Try Chlorpheniramine 4mg  At bedtime  As needed  nasal drainage.   Wear CPAP all night long for at least 6hr each night  Do not drive if sleepy  Work on healthy weight loss.  Follow up in 1 year and As needed

## 2023-02-10 NOTE — Assessment & Plan Note (Signed)
Good control and compliance. Continue on CPAP At bedtime   Add rx for allergy symptoms  Plan  Patient Instructions  Begin Allegra 180mg  daily for 4 weeks and then as needed  Saline nasal spray Twice daily   Saline nasal gel At bedtime   Begin Flonase 2 puffs daily in am for 4 weeks and As needed   Try Atrovent Nasal spray Three times a day  As needed  drippy nose.  Try Chlorpheniramine 4mg  At bedtime  As needed  nasal drainage.   Wear CPAP all night long for at least 6hr each night  Do not drive if sleepy  Work on healthy weight loss.  Follow up in 1 year and As needed

## 2023-02-10 NOTE — Assessment & Plan Note (Signed)
Flare with pollen - change to allegra . Add Flonase , chlor tabs and atrovent nasal   Plan  Patient Instructions  Begin Allegra 180mg  daily for 4 weeks and then as needed  Saline nasal spray Twice daily   Saline nasal gel At bedtime   Begin Flonase 2 puffs daily in am for 4 weeks and As needed   Try Atrovent Nasal spray Three times a day  As needed  drippy nose.  Try Chlorpheniramine 4mg  At bedtime  As needed  nasal drainage.   Wear CPAP all night long for at least 6hr each night  Do not drive if sleepy  Work on healthy weight loss.  Follow up in 1 year and As needed

## 2023-02-11 ENCOUNTER — Other Ambulatory Visit: Payer: Self-pay | Admitting: Nurse Practitioner

## 2023-02-11 DIAGNOSIS — Z1231 Encounter for screening mammogram for malignant neoplasm of breast: Secondary | ICD-10-CM

## 2023-02-16 ENCOUNTER — Ambulatory Visit: Payer: Medicare HMO

## 2023-02-16 ENCOUNTER — Other Ambulatory Visit: Payer: Self-pay

## 2023-02-16 MED ORDER — TRUE METRIX BLOOD GLUCOSE TEST VI STRP: ORAL_STRIP | 1 refills | Status: DC

## 2023-02-16 MED ORDER — TRUE METRIX AIR GLUCOSE METER DEVI: 1.0000 | 0 refills | Status: AC | PRN

## 2023-02-16 MED ORDER — TRUEPLUS LANCETS 33G MISC: 1.0000 | 1 refills | Status: DC | PRN

## 2023-02-16 MED ORDER — BD SWAB SINGLE USE REGULAR PADS: 1.0000 | MEDICATED_PAD | 1 refills | Status: DC | PRN

## 2023-02-16 MED ORDER — TRUE METRIX LEVEL 1 LOW VI SOLN: 1.0000 | 1 refills | Status: AC | PRN

## 2023-02-18 ENCOUNTER — Telehealth: Payer: Self-pay | Admitting: Adult Health

## 2023-02-19 ENCOUNTER — Ambulatory Visit: Payer: Medicare HMO

## 2023-02-19 MED ORDER — IPRATROPIUM BROMIDE 0.03 % NA SOLN: 2.0000 | Freq: Three times a day (TID) | NASAL | 3 refills | Status: AC | PRN

## 2023-02-19 NOTE — Telephone Encounter (Signed)
Rx for the atrovent nasal spray has been sent to pharmacy for pt. Nothing further needed.

## 2023-03-20 ENCOUNTER — Encounter: Payer: Self-pay | Admitting: Nurse Practitioner

## 2023-03-20 ENCOUNTER — Encounter: Payer: Medicare HMO | Admitting: Nurse Practitioner

## 2023-03-20 NOTE — Progress Notes (Signed)
   This service is provided via telemedicine  No vital signs collected/recorded due to the encounter was a telemedicine visit.   Location of patient (ex: home, work):  Home  Patient consents to a telephone visit: Yes, see telephone visit dated 03/20/23   Location of the provider (ex: office, home):  [facility], Office   Name of any referring provider:  N/A  Names of all persons participating in the telemedicine service and their role in the encounter:  S.Chrae B/CMA, Abbey Chatters, NP, and Patient   Time spent on call:  16 min with medical assistant

## 2023-03-24 ENCOUNTER — Telehealth: Payer: Self-pay

## 2023-03-24 NOTE — Telephone Encounter (Signed)
Left message on voicemail for patient's daughter to have patient return call when available.  Reason for call patient needs to reschedule her AWV with Sharon Seller, NP for in person.  ~ CB ~

## 2023-04-24 ENCOUNTER — Telehealth: Payer: Self-pay | Admitting: Nurse Practitioner

## 2023-04-24 ENCOUNTER — Other Ambulatory Visit: Payer: Self-pay | Admitting: Nurse Practitioner

## 2023-04-24 DIAGNOSIS — M7989 Other specified soft tissue disorders: Secondary | ICD-10-CM

## 2023-04-24 DIAGNOSIS — I10 Essential (primary) hypertension: Secondary | ICD-10-CM

## 2023-04-24 DIAGNOSIS — E119 Type 2 diabetes mellitus without complications: Secondary | ICD-10-CM

## 2023-04-24 DIAGNOSIS — G8929 Other chronic pain: Secondary | ICD-10-CM

## 2023-04-24 DIAGNOSIS — E039 Hypothyroidism, unspecified: Secondary | ICD-10-CM

## 2023-04-24 DIAGNOSIS — E1122 Type 2 diabetes mellitus with diabetic chronic kidney disease: Secondary | ICD-10-CM

## 2023-04-24 DIAGNOSIS — E785 Hyperlipidemia, unspecified: Secondary | ICD-10-CM

## 2023-04-24 NOTE — Telephone Encounter (Signed)
High Risk Warning Populated when attempting to refill, I will send to Provider for further review 

## 2023-04-24 NOTE — Telephone Encounter (Signed)
Called and could not leave a message for patient to call back and schedule AWV

## 2023-06-05 ENCOUNTER — Ambulatory Visit (INDEPENDENT_AMBULATORY_CARE_PROVIDER_SITE_OTHER): Payer: Medicare PPO | Admitting: Nurse Practitioner

## 2023-06-05 ENCOUNTER — Encounter: Payer: Self-pay | Admitting: Nurse Practitioner

## 2023-06-05 VITALS — BP 138/76 | HR 79 | Temp 97.1°F | Ht 62.0 in | Wt 239.0 lb

## 2023-06-05 DIAGNOSIS — E1122 Type 2 diabetes mellitus with diabetic chronic kidney disease: Secondary | ICD-10-CM

## 2023-06-05 DIAGNOSIS — Z6841 Body Mass Index (BMI) 40.0 and over, adult: Secondary | ICD-10-CM

## 2023-06-05 DIAGNOSIS — N182 Chronic kidney disease, stage 2 (mild): Secondary | ICD-10-CM

## 2023-06-05 DIAGNOSIS — I10 Essential (primary) hypertension: Secondary | ICD-10-CM | POA: Diagnosis not present

## 2023-06-05 DIAGNOSIS — E785 Hyperlipidemia, unspecified: Secondary | ICD-10-CM

## 2023-06-05 DIAGNOSIS — E039 Hypothyroidism, unspecified: Secondary | ICD-10-CM

## 2023-06-05 DIAGNOSIS — M1A9XX0 Chronic gout, unspecified, without tophus (tophi): Secondary | ICD-10-CM

## 2023-06-05 LAB — CBC WITH DIFFERENTIAL/PLATELET
Absolute Monocytes: 409 cells/uL (ref 200–950)
Basophils Absolute: 31 cells/uL (ref 0–200)
Basophils Relative: 0.7 %
Eosinophils Absolute: 220 cells/uL (ref 15–500)
Eosinophils Relative: 5 %
HCT: 34.5 % — ABNORMAL LOW (ref 35.0–45.0)
Hemoglobin: 11.3 g/dL — ABNORMAL LOW (ref 11.7–15.5)
Lymphs Abs: 1289 cells/uL (ref 850–3900)
MCH: 28.7 pg (ref 27.0–33.0)
MCHC: 32.8 g/dL (ref 32.0–36.0)
MCV: 87.6 fL (ref 80.0–100.0)
MPV: 11.2 fL (ref 7.5–12.5)
Monocytes Relative: 9.3 %
Neutro Abs: 2451 cells/uL (ref 1500–7800)
Neutrophils Relative %: 55.7 %
Platelets: 220 10*3/uL (ref 140–400)
RBC: 3.94 10*6/uL (ref 3.80–5.10)
RDW: 14 % (ref 11.0–15.0)
Total Lymphocyte: 29.3 %
WBC: 4.4 10*3/uL (ref 3.8–10.8)

## 2023-06-05 NOTE — Progress Notes (Signed)
Careteam: Patient Care Team: Sharon Seller, NP as PCP - General (Geriatric Medicine) Serena Croissant, MD as Consulting Physician (Hematology and Oncology) Specialists, [facility] as Consulting Physician (Orthopedic Surgery)  PLACE OF SERVICE:  [facility]  Advanced Directive information Does Patient Have a Medical Advance Directive?: Yes, Type of Advance Directive: Healthcare Power of [place];Out of facility DNR (pink MOST or yellow form), Pre-existing out of facility DNR order (yellow form or pink MOST form): Yellow form placed in chart (order not valid for inpatient use);Pink MOST form placed in chart (order not valid for inpatient use), Does patient want to make changes to medical advance directive?: No - Patient declined  Allergies  Allergen Reactions   Actos [Pioglitazone] Swelling   Hydrocodone Nausea Only and Other (See Comments)    "bloated"   Tramadol Hcl Itching   Codeine Rash   Neurontin [Gabapentin] Palpitations and Other (See Comments)    dizziness   Other Rash    Nuts--PECANS   Peanut-Containing Drug Products Rash    Chief Complaint  Patient presents with   Medical Management of Chronic Issues    6 month follow-up and foot exam. Discuss need for pneumonia vaccine, eye exam, covid booster, A1c, diabetic kidney evaluation, and AWV. Patient denies receiving any vaccines since last visit.       HPI: Patient is a 88 y.o. female for routine follow up  Putting off her diabetic eye exam. Needs money to pay the co-pay.  No changes in vision.  No neuropathy.  125 blood sugar this morning.  Went up to 157 last week after eating mac and cheese.  No low blood sugars.   No recent gout flares.  Having some issues with money- trying to get caught up on her bills- got scammed.  Has been stressful for her. Staying positive  Able to sleep okay  No abnormal pain.    Review of Systems:  Review of Systems  Constitutional:  Negative for chills,  fever and weight loss.  HENT:  Negative for tinnitus.   Respiratory:  Negative for cough, sputum production and shortness of breath.   Cardiovascular:  Negative for chest pain, palpitations and leg swelling.  Gastrointestinal:  Negative for abdominal pain, constipation, diarrhea and heartburn.  Genitourinary:  Negative for dysuria, frequency and urgency.  Musculoskeletal:  Negative for back pain, falls, joint pain and myalgias.  Skin: Negative.   Neurological:  Negative for dizziness and headaches.  Psychiatric/Behavioral:  Negative for depression and memory loss. The patient does not have insomnia.     Past Medical History:  Diagnosis Date   Anemia    Arthritis    Chronic idiopathic gout    06-13-2021  per pt last flare-up  > 2 yrs ago,  right foot   CKD (chronic kidney disease), stage II    Ductal carcinoma in situ (DCIS) of right breast 10/24/2013   right upper inner- DCIS, 12-16-2013  s/p right breast lumpectomy with two re-excision's,  no chemo/ radiation and completed anti-estrogen therapy 06/ 2020;  oncologist-- dr Pamelia Hoit--  released pt per lov note in epic 05-01-2020   Endometrial polyp    Essential tremor    occasional hand tremor   Full dentures    GERD (gastroesophageal reflux disease)    rarely takes tums   Hiatal hernia    History of 2019 novel coronavirus disease (COVID-19)    07/ 2020  w/ moderate symptoms that resolved ,  and 2021 w/ mild symptoms;  no hospitalzation  for with time but has chronic occasional dry cough since   History of temporal artery biopsy    Hypertension    Hypothyroidism, postsurgical 1970   followed by pcp   OAB (overactive bladder)    Osteopenia    PMB (postmenopausal bleeding)    Pneumonia    Type 2 diabetes mellitus (HCC)    followed by pcp   (06-13-2021  per pt only checks once per week, fasting sugar-- 101-120)   Wears hearing aid in both ears    per pt only aid working is right side, but does not wear all the time   Past Surgical  History:  Procedure Laterality Date   ARTERY BIOPSY Bilateral 11/07/2021   Procedure: BILATERAL TEMPORAL ARTERY BIOPSY;  Surgeon: Leonie Douglas, MD;  Location: [facility] OR;  Service: Vascular;  Laterality: Bilateral;   BREAST BIOPSY Right 10/26/2013   BREAST LUMPECTOMY WITH NEEDLE LOCALIZATION Right 12/16/2013   Procedure: BREAST LUMPECTOMY WITH NEEDLE LOCALIZATION;  Surgeon: Mariella Saa, MD;  Location: [facility];  Service: General;  Laterality: Right;   CATARACT EXTRACTION W/ INTRAOCULAR LENS IMPLANT Bilateral 2019   CHOLECYSTECTOMY OPEN  1970   COLONOSCOPY     DILATATION & CURETTAGE/HYSTEROSCOPY WITH MYOSURE N/A 06/18/2021   Procedure: DILATATION & CURETTAGE/HYSTEROSCOPY WITH MYOSURE RESECTION FOR POLYPS;  Surgeon: Patton Salles, MD;  Location: [facility];  Service: Gynecology;  Laterality: N/A;   DILATION AND CURETTAGE OF UTERUS  09/2000   @[facility]  by dr Gaynell Face   FINGER SURGERY     right hand-   RE-EXCISION OF BREAST LUMPECTOMY Right 01/30/2014   Procedure: RE-EXCISION OF BREAST LUMPECTOMY;  Surgeon: Mariella Saa, MD;  Location: [facility];  Service: General;  Laterality: Right;   RE-EXCISION OF BREAST LUMPECTOMY Right 03/06/2014   Procedure: RE-EXCISION OF BREAST LUMPECTOMY;  Surgeon: Mariella Saa, MD;  Location: [facility];  Service: General;  Laterality: Right;   THYROID LOBECTOMY  1970   unilateral   TOTAL SHOULDER ARTHROPLASTY Left 03/04/2011   @[facility]    TOTAL SHOULDER ARTHROPLASTY Right 05/21/2016   Procedure: RIGHT TOTAL SHOULDER ARTHROPLASTY;  Surgeon: Loreta Ave, MD;  Location: [facility] OR;  Service: Orthopedics;  Laterality: Right;   TUBAL LIGATION     yrs ago   Social History:   reports that she has never smoked. She has never used smokeless tobacco. She reports that she does not drink alcohol and does not use drugs.  Family History  Problem Relation Age of Onset   Diabetes Mother     Arthritis Father    Seizures Sister    Colon cancer Sister    Pneumonia Daughter    Cancer Sister        breast   Breast cancer Sister    Multiple sclerosis Son        Deceased     Medications: Patient's Medications  New Prescriptions   No medications on file  Previous Medications   ALCOHOL SWABS (DROPSAFE ALCOHOL PREP) 70 % PADS    USE AS DIRECTED AS NEEDED   ALLOPURINOL (ZYLOPRIM) 100 MG TABLET    TAKE 1 TABLET EVERY DAY AT 9AM FOR GOUT   ATORVASTATIN (LIPITOR) 10 MG TABLET    TAKE 1 TABLET EVERY DAY AT 9AM   BLOOD GLUCOSE CALIBRATION (TRUE METRIX LEVEL 1) LOW SOLN    1 each by In Vitro route as needed.   BLOOD GLUCOSE MONITORING SUPPL (TRUE METRIX AIR GLUCOSE METER)  DEVI    1 Device by Does not apply route as needed.   CALCIUM-VITAMIN D PO    Take 1 tablet by mouth daily.   DICLOFENAC SODIUM (VOLTAREN) 1 % GEL    Apply 4 g topically 4 (four) times daily as needed.   DULOXETINE (CYMBALTA) 20 MG CAPSULE    TAKE 1 CAPSULE EVERY DAY   FLUTICASONE (CUTIVATE) 0.05 % CREAM    Apply 1 Application topically as needed.   FLUTICASONE (FLONASE) 50 MCG/ACT NASAL SPRAY    Place 1 spray into both nostrils 2 (two) times daily.   FUROSEMIDE (LASIX) 20 MG TABLET    TAKE 1 TABLET EVERY DAY AT 9AM   GLIPIZIDE (GLUCOTROL XL) 5 MG 24 HR TABLET    TAKE 1 TABLET EVERY DAY AT 9AM WITH BREAKFAST   IPRATROPIUM (ATROVENT) 0.03 % NASAL SPRAY    Place 2 sprays into both nostrils 3 (three) times daily as needed for rhinitis.   LEVOTHYROXINE (SYNTHROID) 88 MCG TABLET    TAKE 1 TABLET EVERY DAY AT 8AM   LOSARTAN (COZAAR) 50 MG TABLET    TAKE 1 TABLET EVERY DAY AT 9AM   METFORMIN (GLUCOPHAGE) 500 MG TABLET    TAKE 1 TABLET TWICE DAILY AT 9AM AND 5PM WITH MEALS   NYSTATIN CREAM (MYCOSTATIN)    Apply 1 Application topically 2 (two) times daily.   TRUE METRIX BLOOD GLUCOSE TEST TEST STRIP    USE AS INSTRUCTED   TRUEPLUS LANCETS 33G MISC    USE AS DIRECTED AS NEEDED  Modified Medications   No medications on file   Discontinued Medications   No medications on file    Physical Exam:  Vitals:   06/05/23 1319  BP: 138/76  Pulse: 79  Temp: (!) 97.1 F (36.2 C)  TempSrc: Temporal  SpO2: 96%  Weight: 239 lb (108.4 kg)  Height: 5\' 2"  (1.575 m)   Body mass index is 43.71 kg/m. Wt Readings from Last 3 Encounters:  06/05/23 239 lb (108.4 kg)  02/10/23 240 lb 6.4 oz (109 kg)  11/26/22 247 lb 9.6 oz (112.3 kg)    Physical Exam Constitutional:      General: She is not in acute distress.    Appearance: She is well-developed. She is obese. She is not diaphoretic.  HENT:     Head: Normocephalic and atraumatic.     Mouth/Throat:     Pharynx: No oropharyngeal exudate.  Eyes:     Conjunctiva/sclera: Conjunctivae normal.     Pupils: Pupils are equal, round, and reactive to light.  Cardiovascular:     Rate and Rhythm: Normal rate and regular rhythm.     Heart sounds: Normal heart sounds.  Pulmonary:     Effort: Pulmonary effort is normal.     Breath sounds: Normal breath sounds.  Abdominal:     General: Bowel sounds are normal.     Palpations: Abdomen is soft.  Musculoskeletal:     Cervical back: Normal range of motion and neck supple.     Right lower leg: No edema.     Left lower leg: No edema.  Skin:    General: Skin is warm and dry.  Neurological:     Mental Status: She is alert.  Psychiatric:        Mood and Affect: Mood normal.     Labs reviewed: Basic Metabolic Panel: Recent Labs    11/26/22 1140  NA 140  K 4.3  CL 105  CO2 26  GLUCOSE 174*  BUN 17  CREATININE 1.04*  CALCIUM 10.4   Liver Function Tests: Recent Labs    11/26/22 1140  AST 13  ALT 9  BILITOT 0.4  PROT 6.6   No results for input(s): "LIPASE", "AMYLASE" in the last 8760 hours. No results for input(s): "AMMONIA" in the last 8760 hours. CBC: Recent Labs    11/26/22 1140  WBC 5.0  NEUTROABS 3,040  HGB 11.7  HCT 35.7  MCV 90.8  PLT 228   Lipid Panel: Recent Labs    11/26/22 1140  CHOL  123  HDL 55  LDLCALC 51  TRIG 88  CHOLHDL 2.2   TSH: No results for input(s): "TSH" in the last 8760 hours. A1C: Lab Results  Component Value Date   HGBA1C 6.3 (H) 11/26/2022     Assessment/Plan 1. Essential hypertension, benign -Blood pressure well controlled, goal bp <140/90 Continue current medications and dietary modifications follow metabolic panel - CBC with Differential/Platelet  2. Type 2 diabetes mellitus with stage 2 chronic kidney disease, without long-term current use of insulin (HCC) -Encouraged dietary compliance, routine foot care/monitoring and to keep up with diabetic eye exams through ophthalmology  Continue current medication  - COMPLETE METABOLIC PANEL WITH GFR - CBC with Differential/Platelet - Hemoglobin A1c  3. Class 3 severe obesity due to excess calories with serious comorbidity and body mass index (BMI) of 45.0 to 49.9 in adult [facility]) -education provided on healthy weight loss through increase in physical activity and proper nutrition.   4. Acquired hypothyroidism Continues on synthroid 88 mcg - TSH  5. Hyperlipidemia LDL goal <70 -on lipitor daily with dietary modifications.  - Lipid panel - COMPLETE METABOLIC PANEL WITH GFR  6. Chronic gout without tophus, unspecified cause, unspecified site -no recent flares. Continues on allopurinol.  - Uric Acid  Return in about 6 months (around 12/06/2023) for routine follow up.  Janene Harvey. [facility] [phone]

## 2023-06-05 NOTE — Patient Instructions (Signed)
1.) Contact Dr.[facility] @ [phone] to schedule your eye exam and have them fax [place] a copy.

## 2023-06-11 ENCOUNTER — Encounter: Payer: Self-pay | Admitting: Nurse Practitioner

## 2023-06-12 ENCOUNTER — Encounter: Payer: Self-pay | Admitting: Nurse Practitioner

## 2023-06-12 ENCOUNTER — Ambulatory Visit (INDEPENDENT_AMBULATORY_CARE_PROVIDER_SITE_OTHER): Payer: Medicare PPO | Admitting: Nurse Practitioner

## 2023-06-12 VITALS — BP 132/84 | HR 83 | Temp 97.9°F | Ht 62.0 in | Wt 233.0 lb

## 2023-06-12 DIAGNOSIS — N182 Chronic kidney disease, stage 2 (mild): Secondary | ICD-10-CM

## 2023-06-12 DIAGNOSIS — Z0000 Encounter for general adult medical examination without abnormal findings: Secondary | ICD-10-CM | POA: Diagnosis not present

## 2023-06-12 DIAGNOSIS — E1122 Type 2 diabetes mellitus with diabetic chronic kidney disease: Secondary | ICD-10-CM

## 2023-06-12 NOTE — Patient Instructions (Addendum)
  Karla Willis , Thank you for taking time to come for your Medicare Wellness Visit. I appreciate your ongoing commitment to your health goals. Please review the following plan we discussed and let me know if I can assist you in the future.   These are the goals we discussed:  Goals      weight loss     Weight loss through diet         This is a list of the screening recommended for you and due dates:  Health Maintenance  Topic Date Due   Eye exam for diabetics  09/27/2020   COVID-19 Vaccine (5 - 2023-24 season) 07/11/2022   Yearly kidney health urinalysis for diabetes  05/17/2023   Flu Shot  06/11/2023   Hemoglobin A1C  12/06/2023   Yearly kidney function blood test for diabetes  06/04/2024   Complete foot exam   06/04/2024   Medicare Annual Wellness Visit  06/11/2024   DEXA scan (bone density measurement)  07/16/2024   DTaP/Tdap/Td vaccine (2 - Td or Tdap) 04/18/2026   Pneumonia Vaccine  Completed   Zoster (Shingles) Vaccine  Completed   HPV Vaccine  Aged Out   Call [facility] and make diabetic eye exam [facility] Address: [address] [place], [place], [place] [address] [phone]   [facility] [place] Address: [address] [place], [place] [address] Phone: [phone]   Get FLU SHOT IN [place]

## 2023-06-12 NOTE — Progress Notes (Signed)
Subjective:   Karla Willis is a 88 y.o. female who presents for Medicare Annual (Subsequent) preventive examination.  Visit Complete: In person at [facility]  Patient Medicare AWV questionnaire was completed by the patient on 8/1; I have confirmed that all information answered by patient is correct and no changes since this date.  Review of Systems     Cardiac Risk Factors include: advanced age (>72men, >60 women);sedentary lifestyle;hypertension;dyslipidemia;diabetes mellitus;obesity (BMI >30kg/m2)     Objective:    Today's Vitals   06/12/23 1114  BP: 132/84  Pulse: 83  Temp: 97.9 F (36.6 C)  TempSrc: Temporal  SpO2: 98%  Weight: 233 lb (105.7 kg)  Height: 5\' 2"  (1.575 m)   Body mass index is 42.62 kg/m.     06/12/2023   11:16 AM 06/11/2023    4:12 PM 06/05/2023   11:25 AM 03/20/2023   11:38 AM 12/12/2022    8:39 AM 07/30/2022    1:58 PM 07/18/2022    3:33 PM  Advanced Directives  Does Patient Have a Medical Advance Directive? Yes Yes Yes Yes Yes Yes Yes  Type of Estate agent of [place];Out of facility DNR (pink MOST or yellow form) Healthcare Power of [place];Out of facility DNR (pink MOST or yellow form) Healthcare Power of [place];Out of facility DNR (pink MOST or yellow form) Healthcare Power of [place];Living will;Out of facility DNR (pink MOST or yellow form) Healthcare Power of [place];Out of facility DNR (pink MOST or yellow form) Out of facility DNR (pink MOST or yellow form) Out of facility DNR (pink MOST or yellow form)  Does patient want to make changes to medical advance directive? No - Patient declined No - Patient declined No - Patient declined No - Patient declined No - Patient declined  No - Patient declined  Copy of Healthcare Power of Attorney in Chart? Yes - validated most recent copy scanned in chart (See row information) Yes - validated most recent copy scanned in chart (See row information) Yes - validated most recent copy scanned in  chart (See row information) Yes - validated most recent copy scanned in chart (See row information) Yes - validated most recent copy scanned in chart (See row information)    Pre-existing out of facility DNR order (yellow form or pink MOST form) Yellow form placed in chart (order not valid for inpatient use);Pink MOST form placed in chart (order not valid for inpatient use) Yellow form placed in chart (order not valid for inpatient use);Pink MOST form placed in chart (order not valid for inpatient use) Yellow form placed in chart (order not valid for inpatient use);Pink MOST form placed in chart (order not valid for inpatient use) Yellow form placed in chart (order not valid for inpatient use);Pink MOST form placed in chart (order not valid for inpatient use) Yellow form placed in chart (order not valid for inpatient use);Pink MOST form placed in chart (order not valid for inpatient use) Yellow form placed in chart (order not valid for inpatient use);Pink Most/Yellow Form available - Physician notified to receive inpatient order Yellow form placed in chart (order not valid for inpatient use);Pink Most/Yellow Form available - Physician notified to receive inpatient order    Current Medications (verified) Outpatient Encounter Medications as of 06/12/2023  Medication Sig   Alcohol Swabs (DROPSAFE ALCOHOL PREP) 70 % PADS USE AS DIRECTED AS NEEDED   allopurinol (ZYLOPRIM) 100 MG tablet TAKE 1 TABLET EVERY DAY AT 9AM FOR GOUT   atorvastatin (LIPITOR) 10 MG tablet  TAKE 1 TABLET EVERY DAY AT 9AM   Blood Glucose Calibration (TRUE METRIX LEVEL 1) Low SOLN 1 each by In Vitro route as needed.   Blood Glucose Monitoring Suppl (TRUE METRIX AIR GLUCOSE METER) DEVI 1 Device by Does not apply route as needed.   CALCIUM-VITAMIN D PO Take 1 tablet by mouth daily.   diclofenac sodium (VOLTAREN) 1 % GEL Apply 4 g topically 4 (four) times daily as needed.   DULoxetine (CYMBALTA) 20 MG capsule TAKE 1 CAPSULE EVERY DAY    fluticasone (CUTIVATE) 0.05 % cream Apply 1 Application topically as needed.   fluticasone (FLONASE) 50 MCG/ACT nasal spray Place 1 spray into both nostrils 2 (two) times daily.   furosemide (LASIX) 20 MG tablet TAKE 1 TABLET EVERY DAY AT 9AM   glipiZIDE (GLUCOTROL XL) 5 MG 24 hr tablet TAKE 1 TABLET EVERY DAY AT 9AM WITH BREAKFAST   ipratropium (ATROVENT) 0.03 % nasal spray Place 2 sprays into both nostrils 3 (three) times daily as needed for rhinitis.   levothyroxine (SYNTHROID) 88 MCG tablet TAKE 1 TABLET EVERY DAY AT 8AM   losartan (COZAAR) 50 MG tablet TAKE 1 TABLET EVERY DAY AT 9AM   metFORMIN (GLUCOPHAGE) 500 MG tablet TAKE 1 TABLET TWICE DAILY AT 9AM AND 5PM WITH MEALS   nystatin cream (MYCOSTATIN) Apply 1 Application topically 2 (two) times daily.   TRUE METRIX BLOOD GLUCOSE TEST test strip USE AS INSTRUCTED   TRUEplus Lancets 33G MISC USE AS DIRECTED AS NEEDED   No facility-administered encounter medications on file as of 06/12/2023.    Allergies (verified) Actos [pioglitazone], Hydrocodone, Tramadol hcl, Codeine, Neurontin [gabapentin], Other, and Peanut-containing drug products   History: Past Medical History:  Diagnosis Date   Anemia    Arthritis    Chronic idiopathic gout    06-13-2021  per pt last flare-up  > 2 yrs ago,  right foot   CKD (chronic kidney disease), stage II    Ductal carcinoma in situ (DCIS) of right breast 10/24/2013   right upper inner- DCIS, 12-16-2013  s/p right breast lumpectomy with two re-excision's,  no chemo/ radiation and completed anti-estrogen therapy 06/ 2020;  oncologist-- dr Pamelia Hoit--  released pt per lov note in epic 05-01-2020   Endometrial polyp    Essential tremor    occasional hand tremor   Full dentures    GERD (gastroesophageal reflux disease)    rarely takes tums   Hiatal hernia    History of 2019 novel coronavirus disease (COVID-19)    07/ 2020  w/ moderate symptoms that resolved ,  and 2021 w/ mild symptoms;  no hospitalzation  for with time but has chronic occasional dry cough since   History of temporal artery biopsy    Hypertension    Hypothyroidism, postsurgical 1970   followed by pcp   OAB (overactive bladder)    Osteopenia    PMB (postmenopausal bleeding)    Pneumonia    Type 2 diabetes mellitus (HCC)    followed by pcp   (06-13-2021  per pt only checks once per week, fasting sugar-- 101-120)   Wears hearing aid in both ears    per pt only aid working is right side, but does not wear all the time   Past Surgical History:  Procedure Laterality Date   ARTERY BIOPSY Bilateral 11/07/2021   Procedure: BILATERAL TEMPORAL ARTERY BIOPSY;  Surgeon: Leonie Douglas, MD;  Location: [facility] OR;  Service: Vascular;  Laterality: Bilateral;   BREAST BIOPSY Right 10/26/2013  BREAST LUMPECTOMY WITH NEEDLE LOCALIZATION Right 12/16/2013   Procedure: BREAST LUMPECTOMY WITH NEEDLE LOCALIZATION;  Surgeon: Mariella Saa, MD;  Location: [facility];  Service: General;  Laterality: Right;   CATARACT EXTRACTION W/ INTRAOCULAR LENS IMPLANT Bilateral 2019   CHOLECYSTECTOMY OPEN  1970   COLONOSCOPY     DILATATION & CURETTAGE/HYSTEROSCOPY WITH MYOSURE N/A 06/18/2021   Procedure: DILATATION & CURETTAGE/HYSTEROSCOPY WITH MYOSURE RESECTION FOR POLYPS;  Surgeon: Patton Salles, MD;  Location: [facility];  Service: Gynecology;  Laterality: N/A;   DILATION AND CURETTAGE OF UTERUS  09/2000   @[facility]  by dr Gaynell Face   FINGER SURGERY     right hand-   RE-EXCISION OF BREAST LUMPECTOMY Right 01/30/2014   Procedure: RE-EXCISION OF BREAST LUMPECTOMY;  Surgeon: Mariella Saa, MD;  Location: [facility];  Service: General;  Laterality: Right;   RE-EXCISION OF BREAST LUMPECTOMY Right 03/06/2014   Procedure: RE-EXCISION OF BREAST LUMPECTOMY;  Surgeon: Mariella Saa, MD;  Location: [facility];  Service: General;  Laterality: Right;   THYROID LOBECTOMY  1970   unilateral    TOTAL SHOULDER ARTHROPLASTY Left 03/04/2011   @[facility]    TOTAL SHOULDER ARTHROPLASTY Right 05/21/2016   Procedure: RIGHT TOTAL SHOULDER ARTHROPLASTY;  Surgeon: Loreta Ave, MD;  Location: [facility] OR;  Service: Orthopedics;  Laterality: Right;   TUBAL LIGATION     yrs ago   Family History  Problem Relation Age of Onset   Diabetes Mother    Arthritis Father    Seizures Sister    Colon cancer Sister    Pneumonia Daughter    Cancer Sister        breast   Breast cancer Sister    Multiple sclerosis Son        Deceased    Social History   Socioeconomic History   Marital status: Widowed    Spouse name: Not on file   Number of children: Not on file   Years of education: Not on file   Highest education level: Not on file  Occupational History   Not on file  Tobacco Use   Smoking status: Never   Smokeless tobacco: Never  Vaping Use   Vaping status: Never Used  Substance and Sexual Activity   Alcohol use: No    Alcohol/week: 0.0 standard drinks of alcohol   Drug use: Never   Sexual activity: Not Currently    Comment: menarche age 23,1st intercourse 88 yo-Fewer than 5 partners  Other Topics Concern   Not on file  Social History Narrative   Diet: No      Do you drink/ eat things with caffeine? Yes      Marital status:    Widowed                           What year were you married ?  1956      Do you live in a house, apartment,assistred living, condo, trailer, etc.)?  apartment      Is it one or more stories?  one      How many persons live in your home ?  3      Do you have any pets in your home ?(please list)  No      Current or past profession:  Bank, Caregiver      Do you exercise?  No  Type & how often:        Do you have a living will?  Yes      Do you have a DNR form?  Yes                     If not, do you want to discuss one?       Do you have signed POA?HPOA forms?                 If so, please bring to your        appointment       Social Determinants of Health   Financial Resource Strain: Low Risk  (01/07/2018)   Overall Financial Resource Strain (CARDIA)    Difficulty of Paying Living Expenses: Not hard at all  Food Insecurity: No Food Insecurity (01/07/2018)   Hunger Vital Sign    Worried About Running Out of Food in the Last Year: Never true    Ran Out of Food in the Last Year: Never true  Transportation Needs: No Transportation Needs (01/07/2018)   PRAPARE - Administrator, Civil Service (Medical): No    Lack of Transportation (Non-Medical): No  Physical Activity: Inactive (01/07/2018)   Exercise Vital Sign    Days of Exercise per Week: 0 days    Minutes of Exercise per Session: 0 min  Stress: No Stress Concern Present (01/07/2018)   [employer] of Occupational Health - Occupational Stress Questionnaire    Feeling of Stress : Not at all  Social Connections: Somewhat Isolated (01/07/2018)   Social Connection and Isolation Panel [NHANES]    Frequency of Communication with Friends and Family: More than three times a week    Frequency of Social Gatherings with Friends and Family: More than three times a week    Attends Religious Services: More than 4 times per year    Active Member of [employer] or Organizations: No    Attends Banker Meetings: Never    Marital Status: Widowed    Tobacco Counseling Counseling given: Not Answered   Clinical Intake:     Pain : No/denies pain     BMI - recorded: 42 Nutritional Risks: None Diabetes: Yes  How often do you need to have someone help you when you read instructions, pamphlets, or other written materials from your doctor or pharmacy?: 1 - Never         Activities of Daily Living    06/12/2023   11:31 AM  In your present state of health, do you have any difficulty performing the following activities:  Hearing? 0  Vision? 0  Difficulty concentrating or making decisions? 0  Walking or climbing stairs? 0  Dressing or  bathing? 0  Doing errands, shopping? 0  Preparing Food and eating ? N  Using the Toilet? N  In the past six months, have you accidently leaked urine? N  Do you have problems with loss of bowel control? N  Managing your Medications? N  Managing your Finances? N  Housekeeping or managing your Housekeeping? N    Patient Care Team: Sharon Seller, NP as PCP - General (Geriatric Medicine) Serena Croissant, MD as Consulting Physician (Hematology and Oncology) Specialists, [facility] as Consulting Physician (Orthopedic Surgery)  Indicate any recent Medical Services you may have received from other than [facility] providers in the past year (date may be approximate).     Assessment:   This is a routine wellness examination  for [place].  Hearing/Vision screen Vision Screening   Right eye Left eye Both eyes  Without correction 20/40 20/40 20/40   With correction     Comments: Last eye exam, greater than 12 months ago. Patient wears reading glasses  Hearing Screening - Comments:: Patient admits to hearing loss, has hearing aids   Dietary issues and exercise activities discussed:     Goals Addressed             This Visit's Progress    weight loss       Weight loss through diet        Depression Screen    06/12/2023   11:15 AM 06/05/2023    1:20 PM 03/20/2023    2:06 PM 11/26/2022   11:15 AM 01/28/2022    2:08 PM 12/13/2021    4:03 PM 06/28/2021   10:58 AM  PHQ 2/9 Scores  PHQ - 2 Score 0 0 0 0 0 0 0    Fall Risk    06/12/2023   11:15 AM 06/05/2023    1:20 PM 03/20/2023    2:06 PM 11/26/2022   11:15 AM 05/16/2022    2:46 PM  Fall Risk   Falls in the past year? 0 0 0 0 0  Number falls in past yr: 0 0 0 0 0  Injury with Fall? 0 0 0 0   Risk for fall due to : No Fall Risks No Fall Risks No Fall Risks No Fall Risks No Fall Risks  Follow up Falls evaluation completed Falls evaluation completed Falls evaluation completed Falls evaluation completed Falls evaluation  completed    MEDICARE RISK AT HOME:   TIMED UP AND GO:  Was the test performed?  No    Cognitive Function:    06/12/2023   11:18 AM 01/11/2019    2:18 PM 01/07/2018    2:55 PM 01/14/2017    2:24 PM 01/11/2016   10:23 AM  MMSE - Mini Mental State Exam  Not completed:     --  Orientation to time 5 5 5 5 5   Orientation to Place 5 5 5 5 5   Registration 3 3 3 3 3   Attention/ Calculation 5 5 5 5 5   Recall 3 3 1 3 2   Language- name 2 objects 2 2 2 2 2   Language- repeat 1 1 1 1 1   Language- follow 3 step command 3 3 3 3 3   Language- read & follow direction 1 1 1 1 1   Write a sentence 1 1 1 1 1   Copy design 1 1 1 1 1   Total score 30 30 28 30 29         03/20/2023    2:09 PM 01/28/2022    2:13 PM 01/22/2021   10:12 AM 01/17/2020    8:29 AM  6CIT Screen  What Year? 0 points 0 points 0 points 0 points  What month? 0 points 0 points 0 points 0 points  What time? 0 points 0 points 0 points 0 points  Count back from 20 0 points 0 points 0 points 0 points  Months in reverse 0 points 0 points 0 points 0 points  Repeat phrase 0 points 0 points 0 points 0 points  Total Score 0 points 0 points 0 points 0 points    Immunizations Immunization History  Administered Date(s) Administered   Fluad Quad(high Dose 65+) 10/08/2020, 12/13/2021   Influenza, High Dose Seasonal PF 09/01/2019   Influenza,inj,Quad PF,6+ Mos 09/07/2015, 07/25/2016, 09/22/2017  Influenza-Unspecified 08/10/2014, 10/04/2018   PFIZER(Purple Top)SARS-COV-2 Vaccination 12/17/2019, 01/07/2020, 12/25/2020   Pfizer Covid-19 Vaccine Bivalent Booster 77yrs & up 03/08/2022   Pneumococcal Conjugate-13 01/14/2017   Pneumococcal Polysaccharide-23 11/10/2013   Tdap 04/18/2016   Zoster Recombinant(Shingrix) 02/01/2022, 05/03/2022   Zoster, Live 04/18/2012    TDAP status: Up to date  Flu Vaccine status: Due, Education has been provided regarding the importance of this vaccine. Advised may receive this vaccine at local pharmacy or  Health Dept. Aware to provide a copy of the vaccination record if obtained from local pharmacy or Health Dept. Verbalized acceptance and understanding.  Pneumococcal vaccine status: Up to date  Covid-19 vaccine status: Information provided on how to obtain vaccines.   Qualifies for Shingles Vaccine? Yes   Zostavax completed No   Shingrix Completed?: Yes  Screening Tests Health Maintenance  Topic Date Due   OPHTHALMOLOGY EXAM  09/27/2020   COVID-19 Vaccine (5 - 2023-24 season) 07/11/2022   Diabetic kidney evaluation - Urine ACR  05/17/2023   INFLUENZA VACCINE  06/11/2023   HEMOGLOBIN A1C  12/06/2023   Diabetic kidney evaluation - eGFR measurement  06/04/2024   FOOT EXAM  06/04/2024   Medicare Annual Wellness (AWV)  06/11/2024   DEXA SCAN  07/16/2024   DTaP/Tdap/Td (2 - Td or Tdap) 04/18/2026   Pneumonia Vaccine 48+ Years old  Completed   Zoster Vaccines- Shingrix  Completed   HPV VACCINES  Aged Out    Health Maintenance  Health Maintenance Due  Topic Date Due   OPHTHALMOLOGY EXAM  09/27/2020   COVID-19 Vaccine (5 - 2023-24 season) 07/11/2022   Diabetic kidney evaluation - Urine ACR  05/17/2023   INFLUENZA VACCINE  06/11/2023    Colorectal cancer screening: No longer required.   Mammogram status: No longer required due to age.  Bone Density status: Completed 07/2022. Results reflect: Bone density results: OSTEOPOROSIS. Repeat every 2 years.  Lung Cancer Screening: (Low Dose CT Chest recommended if Age 19-80 years, 20 pack-year currently smoking OR have quit w/in 15years.) does not qualify.   Lung Cancer Screening Referral: na  Additional Screening:  Hepatitis C Screening: does not qualify  Vision Screening: Recommended annual ophthalmology exams for early detection of glaucoma and other disorders of the eye. Is the patient up to date with their annual eye exam?  No  Who is the provider or what is the name of the office in which the patient attends annual eye exams?  [facility] If pt is not established with a provider, would they like to be referred to a provider to establish care? No .   Dental Screening: Recommended annual dental exams for proper oral hygiene   Community Resource Referral / Chronic Care Management: CRR required this visit?  No   CCM required this visit?  No     Plan:     I have personally reviewed and noted the following in the patient's chart:   Medical and social history Use of alcohol, tobacco or illicit drugs  Current medications and supplements including opioid prescriptions. Patient is not currently taking opioid prescriptions. Functional ability and status Nutritional status Physical activity Advanced directives List of other physicians Hospitalizations, surgeries, and ER visits in previous 12 months Vitals Screenings to include cognitive, depression, and falls Referrals and appointments  In addition, I have reviewed and discussed with patient certain preventive protocols, quality metrics, and best practice recommendations. A written personalized care plan for preventive services as well as general preventive health recommendations were provided to patient.  Sharon Seller, NP   06/12/2023

## 2023-08-10 DIAGNOSIS — G4733 Obstructive sleep apnea (adult) (pediatric): Secondary | ICD-10-CM | POA: Diagnosis not present

## 2023-08-18 DIAGNOSIS — M542 Cervicalgia: Secondary | ICD-10-CM | POA: Diagnosis not present

## 2023-08-19 ENCOUNTER — Other Ambulatory Visit: Payer: Self-pay

## 2023-08-19 ENCOUNTER — Emergency Department (HOSPITAL_BASED_OUTPATIENT_CLINIC_OR_DEPARTMENT_OTHER)
Admission: EM | Admit: 2023-08-19 | Discharge: 2023-08-19 | Disposition: A | Discharge status: Home / Self Care | Payer: Medicare PPO | Attending: Emergency Medicine | Admitting: Emergency Medicine

## 2023-08-19 ENCOUNTER — Encounter (HOSPITAL_BASED_OUTPATIENT_CLINIC_OR_DEPARTMENT_OTHER): Payer: Self-pay

## 2023-08-19 ENCOUNTER — Emergency Department (HOSPITAL_BASED_OUTPATIENT_CLINIC_OR_DEPARTMENT_OTHER): Payer: Medicare PPO

## 2023-08-19 DIAGNOSIS — S299XXA Unspecified injury of thorax, initial encounter: Secondary | ICD-10-CM | POA: Diagnosis not present

## 2023-08-19 DIAGNOSIS — R109 Unspecified abdominal pain: Secondary | ICD-10-CM

## 2023-08-19 DIAGNOSIS — Z91010 Allergy to peanuts: Secondary | ICD-10-CM | POA: Diagnosis not present

## 2023-08-19 DIAGNOSIS — R079 Chest pain, unspecified: Secondary | ICD-10-CM | POA: Diagnosis not present

## 2023-08-19 DIAGNOSIS — S20219A Contusion of unspecified front wall of thorax, initial encounter: Secondary | ICD-10-CM | POA: Diagnosis not present

## 2023-08-19 DIAGNOSIS — S3991XA Unspecified injury of abdomen, initial encounter: Secondary | ICD-10-CM | POA: Diagnosis not present

## 2023-08-19 DIAGNOSIS — S20212A Contusion of left front wall of thorax, initial encounter: Secondary | ICD-10-CM | POA: Diagnosis not present

## 2023-08-19 DIAGNOSIS — Z96612 Presence of left artificial shoulder joint: Secondary | ICD-10-CM | POA: Diagnosis not present

## 2023-08-19 DIAGNOSIS — K429 Umbilical hernia without obstruction or gangrene: Secondary | ICD-10-CM | POA: Diagnosis not present

## 2023-08-19 DIAGNOSIS — S3993XA Unspecified injury of pelvis, initial encounter: Secondary | ICD-10-CM | POA: Diagnosis not present

## 2023-08-19 DIAGNOSIS — Z96611 Presence of right artificial shoulder joint: Secondary | ICD-10-CM | POA: Diagnosis not present

## 2023-08-19 DIAGNOSIS — Y92410 Unspecified street and highway as the place of occurrence of the external cause: Secondary | ICD-10-CM | POA: Insufficient documentation

## 2023-08-19 LAB — CBC
HCT: 36 % (ref 36.0–46.0)
Hemoglobin: 12.2 g/dL (ref 12.0–15.0)
MCH: 29 pg (ref 26.0–34.0)
MCHC: 33.9 g/dL (ref 30.0–36.0)
MCV: 85.7 fL (ref 80.0–100.0)
Platelets: 241 10*3/uL (ref 150–400)
RBC: 4.2 MIL/uL (ref 3.87–5.11)
RDW: 14.6 % (ref 11.5–15.5)
WBC: 11.3 10*3/uL — ABNORMAL HIGH (ref 4.0–10.5)
nRBC: 0 % (ref 0.0–0.2)

## 2023-08-19 LAB — BASIC METABOLIC PANEL
Anion gap: 10 (ref 5–15)
BUN: 21 mg/dL (ref 8–23)
CO2: 26 mmol/L (ref 22–32)
Calcium: 10 mg/dL (ref 8.9–10.3)
Chloride: 103 mmol/L (ref 98–111)
Creatinine, Ser: 1.13 mg/dL — ABNORMAL HIGH (ref 0.44–1.00)
GFR, Estimated: 48 mL/min — ABNORMAL LOW (ref >60)
Glucose, Bld: 123 mg/dL — ABNORMAL HIGH (ref 70–99)
Potassium: 3.3 mmol/L — ABNORMAL LOW (ref 3.5–5.1)
Sodium: 139 mmol/L (ref 135–145)

## 2023-08-19 LAB — HEPATIC FUNCTION PANEL
ALT: 14 U/L (ref 0–44)
AST: 23 U/L (ref 15–41)
Albumin: 3.7 g/dL (ref 3.5–5.0)
Alkaline Phosphatase: 69 U/L (ref 38–126)
Bilirubin, Direct: 0.1 mg/dL (ref 0.0–0.2)
Indirect Bilirubin: 0.4 mg/dL (ref 0.3–0.9)
Total Bilirubin: 0.5 mg/dL (ref 0.3–1.2)
Total Protein: 7.3 g/dL (ref 6.5–8.1)

## 2023-08-19 LAB — LIPASE, BLOOD: Lipase: 25 U/L (ref 11–51)

## 2023-08-19 LAB — TROPONIN I (HIGH SENSITIVITY): Troponin I (High Sensitivity): 5 ng/L (ref <18)

## 2023-08-19 MED ORDER — IOHEXOL 300 MG/ML  SOLN
100.0000 mL | Freq: Once | INTRAMUSCULAR | Status: AC | PRN
  Administered 2023-08-19: 100 mL via INTRAVENOUS

## 2023-08-19 NOTE — ED Notes (Signed)
Back from CT
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
## 2023-08-19 NOTE — ED Notes (Signed)
To CT

## 2023-08-19 NOTE — ED Notes (Signed)
Assumed care of patient.

## 2023-08-24 ENCOUNTER — Telehealth: Payer: Self-pay

## 2023-08-24 NOTE — Transitions of Care (Post Inpatient/ED Visit) (Unsigned)
08/24/2023  Name: Karla Willis MRN: [id number] DOB: [birth year]  Today's TOC FU Call Status: Today's TOC FU Call Status:: Unsuccessful Call (1st Attempt) Unsuccessful Call (1st Attempt) Date: 08/24/23  Attempted to reach the patient regarding the most recent Inpatient/ED visit.  Follow Up Plan: Additional outreach attempts will be made to reach the patient to complete the Transitions of Care (Post Inpatient/ED visit) call.   Signature : Char Feltman.D/RMA

## 2023-08-25 ENCOUNTER — Ambulatory Visit (INDEPENDENT_AMBULATORY_CARE_PROVIDER_SITE_OTHER): Payer: Medicare PPO | Admitting: Sports Medicine

## 2023-08-25 ENCOUNTER — Encounter: Payer: Self-pay | Admitting: Sports Medicine

## 2023-08-25 VITALS — BP 144/70 | HR 97 | Temp 97.1°F | Resp 17 | Ht 62.0 in | Wt 232.6 lb

## 2023-08-25 DIAGNOSIS — E041 Nontoxic single thyroid nodule: Secondary | ICD-10-CM | POA: Diagnosis not present

## 2023-08-25 DIAGNOSIS — R0789 Other chest pain: Secondary | ICD-10-CM

## 2023-08-25 DIAGNOSIS — I10 Essential (primary) hypertension: Secondary | ICD-10-CM

## 2023-08-25 DIAGNOSIS — E876 Hypokalemia: Secondary | ICD-10-CM | POA: Diagnosis not present

## 2023-08-25 DIAGNOSIS — R1084 Generalized abdominal pain: Secondary | ICD-10-CM

## 2023-08-25 MED ORDER — BACLOFEN 5 MG PO TABS: 5.0000 mg | ORAL_TABLET | Freq: Two times a day (BID) | ORAL | 0 refills | Status: AC

## 2023-08-25 NOTE — Patient Instructions (Signed)
Take tylenol 1000mg  three times daily  Take baclofen 5 mg twice daily  Use lidocaine patches
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
## 2023-08-26 LAB — BASIC METABOLIC PANEL WITH GFR
BUN/Creatinine Ratio: 10 (calc) (ref 6–22)
BUN: 11 mg/dL (ref 7–25)
CO2: 29 mmol/L (ref 20–32)
Calcium: 10.7 mg/dL — ABNORMAL HIGH (ref 8.6–10.4)
Chloride: 105 mmol/L (ref 98–110)
Creat: 1.06 mg/dL — ABNORMAL HIGH (ref 0.60–0.95)
Glucose, Bld: 107 mg/dL — ABNORMAL HIGH (ref 65–99)
Potassium: 4.4 mmol/L (ref 3.5–5.3)
Sodium: 143 mmol/L (ref 135–146)
eGFR: 51 mL/min/{1.73_m2} — ABNORMAL LOW (ref >=60)

## 2023-08-27 NOTE — Telephone Encounter (Signed)
Patient was seen in office 08/25/2023

## 2023-09-09 DIAGNOSIS — G4733 Obstructive sleep apnea (adult) (pediatric): Secondary | ICD-10-CM | POA: Diagnosis not present

## 2023-09-14 ENCOUNTER — Telehealth: Payer: Self-pay | Admitting: *Deleted

## 2023-09-14 NOTE — Telephone Encounter (Signed)
Karla Chatters, NP wanted me to call patient because we received a Sports administrator Request in the mail for the patient and Shanda Bumps stated that most of the time patient's will have to Pay Out Of Pocket for things like this.   Requested that I call patient and inform before she filled out.   Called patient and she stated that she DOES NOT want this and to Shred it.   Paperwork Shredded as patient requested.   [employer] [phone] Fax:[phone] Patient GN:[id number]

## 2023-09-15 ENCOUNTER — Ambulatory Visit
Admission: RE | Admit: 2023-09-15 | Discharge: 2023-09-15 | Disposition: A | Discharge status: Home / Self Care | Payer: Medicare PPO | Source: Ambulatory Visit | Attending: Sports Medicine | Admitting: Sports Medicine

## 2023-09-15 DIAGNOSIS — E041 Nontoxic single thyroid nodule: Secondary | ICD-10-CM

## 2023-09-18 ENCOUNTER — Ambulatory Visit (INDEPENDENT_AMBULATORY_CARE_PROVIDER_SITE_OTHER): Payer: Medicare PPO | Admitting: Nurse Practitioner

## 2023-09-18 ENCOUNTER — Encounter: Payer: Self-pay | Admitting: Nurse Practitioner

## 2023-09-18 VITALS — BP 130/78 | HR 88 | Temp 97.9°F | Ht 62.0 in | Wt 235.0 lb

## 2023-09-18 DIAGNOSIS — R1084 Generalized abdominal pain: Secondary | ICD-10-CM | POA: Diagnosis not present

## 2023-09-18 DIAGNOSIS — M546 Pain in thoracic spine: Secondary | ICD-10-CM

## 2023-09-18 DIAGNOSIS — Z66 Do not resuscitate: Secondary | ICD-10-CM

## 2023-09-18 DIAGNOSIS — Z23 Encounter for immunization: Secondary | ICD-10-CM

## 2023-09-18 NOTE — Patient Instructions (Addendum)
Tylenol 325 mg 2 tablets every 6 hours while awake- take routinely over the next week or so.  Use heating pad ~20-30 mins to back followed by muscle rub. (Bengay, biofreeze, aspercream, icyhot) three times daily   If things worsen or fail to improve to notify
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
## 2023-10-10 DIAGNOSIS — G4733 Obstructive sleep apnea (adult) (pediatric): Secondary | ICD-10-CM | POA: Diagnosis not present

## 2023-11-09 DIAGNOSIS — G4733 Obstructive sleep apnea (adult) (pediatric): Secondary | ICD-10-CM | POA: Diagnosis not present

## 2023-12-07 ENCOUNTER — Encounter: Payer: Medicare PPO | Admitting: Nurse Practitioner

## 2023-12-07 NOTE — Progress Notes (Signed)
This encounter was created in error - please disregard.

## 2023-12-28 NOTE — Telephone Encounter (Addendum)
 ERROR

## 2023-12-29 ENCOUNTER — Other Ambulatory Visit: Payer: Self-pay

## 2023-12-29 NOTE — Telephone Encounter (Signed)
 Request sent to primary care provide to confirm medication meant for long term use

## 2023-12-30 ENCOUNTER — Other Ambulatory Visit: Payer: Self-pay

## 2023-12-30 MED ORDER — NYSTATIN 100000 UNIT/GM EX CREA: 1.0000 | TOPICAL_CREAM | Freq: Two times a day (BID) | CUTANEOUS | 1 refills | Status: AC

## 2023-12-30 MED ORDER — FLUTICASONE PROPIONATE 0.05 % EX CREA: 1.0000 | TOPICAL_CREAM | CUTANEOUS | 1 refills | Status: AC | PRN

## 2024-01-07 NOTE — Telephone Encounter (Signed)
 This encounter was created in error - please disregard.

## 2024-02-02 ENCOUNTER — Ambulatory Visit: Payer: Medicare PPO | Admitting: Family Medicine

## 2024-02-11 ENCOUNTER — Encounter: Payer: Self-pay | Admitting: Family Medicine

## 2024-02-11 ENCOUNTER — Ambulatory Visit: Payer: Medicare HMO | Admitting: Adult Health

## 2024-02-29 ENCOUNTER — Ambulatory Visit (INDEPENDENT_AMBULATORY_CARE_PROVIDER_SITE_OTHER): Payer: Self-pay | Admitting: Nurse Practitioner

## 2024-02-29 ENCOUNTER — Encounter: Payer: Self-pay | Admitting: Nurse Practitioner

## 2024-02-29 VITALS — BP 122/80 | HR 71 | Temp 97.5°F | Ht 62.0 in | Wt 236.0 lb

## 2024-02-29 DIAGNOSIS — E034 Atrophy of thyroid (acquired): Secondary | ICD-10-CM

## 2024-02-29 DIAGNOSIS — I10 Essential (primary) hypertension: Secondary | ICD-10-CM

## 2024-02-29 DIAGNOSIS — E785 Hyperlipidemia, unspecified: Secondary | ICD-10-CM

## 2024-02-29 DIAGNOSIS — Z853 Personal history of malignant neoplasm of breast: Secondary | ICD-10-CM | POA: Diagnosis not present

## 2024-02-29 DIAGNOSIS — E66813 Obesity, class 3: Secondary | ICD-10-CM | POA: Diagnosis not present

## 2024-02-29 DIAGNOSIS — M150 Primary generalized (osteo)arthritis: Secondary | ICD-10-CM

## 2024-02-29 DIAGNOSIS — G4733 Obstructive sleep apnea (adult) (pediatric): Secondary | ICD-10-CM

## 2024-02-29 DIAGNOSIS — Z1231 Encounter for screening mammogram for malignant neoplasm of breast: Secondary | ICD-10-CM

## 2024-02-29 DIAGNOSIS — N182 Chronic kidney disease, stage 2 (mild): Secondary | ICD-10-CM

## 2024-02-29 DIAGNOSIS — M5441 Lumbago with sciatica, right side: Secondary | ICD-10-CM

## 2024-02-29 DIAGNOSIS — D508 Other iron deficiency anemias: Secondary | ICD-10-CM

## 2024-02-29 DIAGNOSIS — E1122 Type 2 diabetes mellitus with diabetic chronic kidney disease: Secondary | ICD-10-CM

## 2024-02-29 DIAGNOSIS — R350 Frequency of micturition: Secondary | ICD-10-CM | POA: Diagnosis not present

## 2024-02-29 DIAGNOSIS — Z6841 Body Mass Index (BMI) 40.0 and over, adult: Secondary | ICD-10-CM

## 2024-02-29 DIAGNOSIS — J301 Allergic rhinitis due to pollen: Secondary | ICD-10-CM

## 2024-02-29 DIAGNOSIS — M1000 Idiopathic gout, unspecified site: Secondary | ICD-10-CM | POA: Diagnosis not present

## 2024-02-29 DIAGNOSIS — G8929 Other chronic pain: Secondary | ICD-10-CM | POA: Insufficient documentation

## 2024-02-29 LAB — POCT URINALYSIS DIPSTICK
Bilirubin, UA: NEGATIVE
Glucose, UA: NEGATIVE
Ketones, UA: NEGATIVE
Nitrite, UA: NEGATIVE
Protein, UA: NEGATIVE
Spec Grav, UA: 1.01 (ref 1.010–1.025)
Urobilinogen, UA: 1 U/dL
pH, UA: 6 (ref 5.0–8.0)

## 2024-02-29 NOTE — Assessment & Plan Note (Signed)
 Blood pressure well controlled, goal bp <140/90 Continue current medications and dietary modifications follow metabolic panel

## 2024-02-29 NOTE — Assessment & Plan Note (Signed)
 LDL at goal on last labs, continues on lipitor.

## 2024-02-29 NOTE — Assessment & Plan Note (Signed)
 Continues on synthroid  88 mcg, TSH at goal, follow up yearly

## 2024-02-29 NOTE — Assessment & Plan Note (Signed)
 Continues with CPAP, due for follow up with pulmonary follow up.

## 2024-02-29 NOTE — Assessment & Plan Note (Signed)
 Hgb stable on last labs, will follow up.

## 2024-02-29 NOTE — Assessment & Plan Note (Signed)
 Encouraged dietary compliance, routine foot care/monitoring and to keep up with diabetic eye exams through ophthalmology.  Continues on metformin  and glipizide . No hypoglycemia reported Follow up a1c

## 2024-02-29 NOTE — Assessment & Plan Note (Signed)
 Stable without worsening of pain.  Tylenol  PRN

## 2024-02-29 NOTE — Assessment & Plan Note (Signed)
 No recent flares on allopurinol .  Uric acid at goal on last labs.

## 2024-02-29 NOTE — Progress Notes (Signed)
 Careteam: Patient Care Team: Verma Gobble, NP as PCP - General (Geriatric Medicine) Cameron Cea, MD as Consulting Physician (Hematology and Oncology) Specialists, [facility] as Consulting Physician (Orthopedic Surgery)  PLACE OF SERVICE:  [facility]  Advanced Directive information    Allergies  Allergen Reactions   Actos  [Pioglitazone ] Swelling   Hydrocodone  Nausea Only and Other (See Comments)    "bloated"   Tramadol  Hcl Itching   Codeine Rash   Neurontin  [Gabapentin ] Palpitations and Other (See Comments)    dizziness   Other Rash    Nuts--PECANS   Peanut-Containing Drug Products Rash    Chief Complaint  Patient presents with   Urinary Frequency    Frequent urination (about every hour)  and headaches. Discussed need for covid booster, A1c, and eye exam. Patient would also like to discuss CPAP machine, patient states every time she uses she gets sick (URI), for that reason patient uses infrequently     Discussed the use of AI scribe software for clinical note transcription with the patient, who gave verbal consent to proceed.  History of Present Illness   Karla Willis is an 89 year old female who presents with increased urinary frequency.  She experiences increased urinary frequency without associated pain, fever, chills, or abdominal pain. Does report urgency but no pressure is noted. She denies pain with urination.   She is currently taking metformin  and glipizide  for diabetes management, with her last A1c check due. No episodes of hypoglycemia are reported.  She has a history of gout but no recent flares. She continues to take allopurinol  100 mg daily, with her last uric acid level checked in July and within normal limits.  Her history of breast cancer was diagnosed approximately ten years ago. She has been released from oncology follow-up but continues to have yearly mammograms- currently overdue.   She uses a CPAP machine but experiences  symptoms of runny nose, sneezing, and watery eyes occurring two to three days after use.   No leg swelling.    She has a thyroid  nodule that was previously biopsied and found to be stable on last imaging in October.  She has a history of anemia but no recent episodes.   She is taking Cymbalta , for back pain, and her back pain is currently well-controlled.  She mentions having a hernia that does not bother her and denies any associated pain or tenderness.   She also reports sleeping well at night.       Review of Systems:  Review of Systems  Constitutional:  Negative for chills, fever and weight loss.  HENT:  Negative for tinnitus.   Respiratory:  Negative for cough, sputum production and shortness of breath.   Cardiovascular:  Negative for chest pain, palpitations and leg swelling.  Gastrointestinal:  Negative for abdominal pain, constipation, diarrhea and heartburn.  Genitourinary:  Positive for frequency and urgency. Negative for dysuria.  Musculoskeletal:  Negative for back pain, falls, joint pain and myalgias.  Skin: Negative.   Neurological:  Negative for dizziness and headaches.  Psychiatric/Behavioral:  Negative for depression and memory loss. The patient does not have insomnia.     Past Medical History:  Diagnosis Date   Anemia    Arthritis    Chronic idiopathic gout    06-13-2021  per pt last flare-up  > 2 yrs ago,  right foot   CKD (chronic kidney disease), stage II    DCIS (ductal carcinoma in situ) of breast 11/11/2013   Ductal  carcinoma in situ (DCIS) of right breast 10/24/2013   right upper inner- DCIS, 12-16-2013  s/p right breast lumpectomy with two re-excision's,  no chemo/ radiation and completed anti-estrogen therapy 06/ 2020;  oncologist-- dr Lee Public--  released pt per lov note in epic 05-01-2020   Endometrial polyp    Essential tremor    occasional hand tremor   Full dentures    GERD (gastroesophageal reflux disease)    rarely takes tums   Hiatal  hernia    History of 2019 novel coronavirus disease (COVID-19)    07/ 2020  w/ moderate symptoms that resolved ,  and 2021 w/ mild symptoms;  no hospitalzation for with time but has chronic occasional dry cough since   History of temporal artery biopsy    Hypertension    Hypothyroidism, postsurgical 1970   followed by pcp   OAB (overactive bladder)    Osteopenia    PMB (postmenopausal bleeding)    Pneumonia    Type 2 diabetes mellitus (HCC)    followed by pcp   (06-13-2021  per pt only checks once per week, fasting sugar-- 101-120)   Wears hearing aid in both ears    per pt only aid working is right side, but does not wear all the time   Past Surgical History:  Procedure Laterality Date   ARTERY BIOPSY Bilateral 11/07/2021   Procedure: BILATERAL TEMPORAL ARTERY BIOPSY;  Surgeon: Carlene Che, MD;  Location: [facility] OR;  Service: Vascular;  Laterality: Bilateral;   BREAST BIOPSY Right 10/26/2013   BREAST LUMPECTOMY WITH NEEDLE LOCALIZATION Right 12/16/2013   Procedure: BREAST LUMPECTOMY WITH NEEDLE LOCALIZATION;  Surgeon: Quitman Bucy, MD;  Location: [facility];  Service: General;  Laterality: Right;   CATARACT EXTRACTION W/ INTRAOCULAR LENS IMPLANT Bilateral 2019   CHOLECYSTECTOMY OPEN  1970   COLONOSCOPY     DILATATION & CURETTAGE/HYSTEROSCOPY WITH MYOSURE N/A 06/18/2021   Procedure: DILATATION & CURETTAGE/HYSTEROSCOPY WITH MYOSURE RESECTION FOR POLYPS;  Surgeon: Greta Leatherwood, MD;  Location: [facility];  Service: Gynecology;  Laterality: N/A;   DILATION AND CURETTAGE OF UTERUS  09/2000   @[facility]  by dr Charon Copper   FINGER SURGERY     right hand-   RE-EXCISION OF BREAST LUMPECTOMY Right 01/30/2014   Procedure: RE-EXCISION OF BREAST LUMPECTOMY;  Surgeon: Quitman Bucy, MD;  Location: [facility];  Service: General;  Laterality: Right;   RE-EXCISION OF BREAST LUMPECTOMY Right 03/06/2014   Procedure: RE-EXCISION OF BREAST  LUMPECTOMY;  Surgeon: Quitman Bucy, MD;  Location: [facility];  Service: General;  Laterality: Right;   THYROID  LOBECTOMY  1970   unilateral   TOTAL SHOULDER ARTHROPLASTY Left 03/04/2011   @[facility]    TOTAL SHOULDER ARTHROPLASTY Right 05/21/2016   Procedure: RIGHT TOTAL SHOULDER ARTHROPLASTY;  Surgeon: Ferd Householder, MD;  Location: [facility] OR;  Service: Orthopedics;  Laterality: Right;   TUBAL LIGATION     yrs ago   Social History:   reports that she has never smoked. She has never used smokeless tobacco. She reports that she does not drink alcohol and does not use drugs.  Family History  Problem Relation Age of Onset   Diabetes Mother    Arthritis Father    Seizures Sister    Colon cancer Sister    Pneumonia Daughter    Cancer Sister        breast   Breast cancer Sister    Multiple sclerosis Son  Deceased     Medications: Patient's Medications  New Prescriptions   No medications on file  Previous Medications   ALCOHOL SWABS (DROPSAFE ALCOHOL PREP) 70 % PADS    USE AS DIRECTED AS NEEDED   ALLOPURINOL  (ZYLOPRIM ) 100 MG TABLET    TAKE 1 TABLET EVERY DAY AT 9AM FOR GOUT   ATORVASTATIN  (LIPITOR) 10 MG TABLET    TAKE 1 TABLET EVERY DAY AT 9AM   BACLOFEN  5 MG TABS    Take 1 tablet (5 mg total) by mouth in the morning and at bedtime.   BLOOD GLUCOSE CALIBRATION (TRUE METRIX LEVEL 1) LOW SOLN    1 each by In Vitro route as needed.   BLOOD GLUCOSE MONITORING SUPPL (TRUE METRIX AIR GLUCOSE METER) DEVI    1 Device by Does not apply route as needed.   CALCIUM -VITAMIN D  PO    Take 1 tablet by mouth daily.   DICLOFENAC  SODIUM (VOLTAREN ) 1 % GEL    Apply 4 g topically 4 (four) times daily as needed.   DULOXETINE  (CYMBALTA ) 20 MG CAPSULE    TAKE 1 CAPSULE EVERY DAY   FLUTICASONE  (CUTIVATE ) 0.05 % CREAM    Apply 1 Application topically as needed.   FLUTICASONE  (FLONASE ) 50 MCG/ACT NASAL SPRAY    Place 1 spray into both nostrils 2 (two) times daily.   FUROSEMIDE  (LASIX ) 20  MG TABLET    TAKE 1 TABLET EVERY DAY AT 9AM   GLIPIZIDE  (GLUCOTROL  XL) 5 MG 24 HR TABLET    TAKE 1 TABLET EVERY DAY AT 9AM WITH BREAKFAST   IPRATROPIUM (ATROVENT ) 0.03 % NASAL SPRAY    Place 2 sprays into both nostrils 3 (three) times daily as needed for rhinitis.   LEVOTHYROXINE  (SYNTHROID ) 88 MCG TABLET    TAKE 1 TABLET EVERY DAY AT 8AM   LOSARTAN  (COZAAR ) 50 MG TABLET    TAKE 1 TABLET EVERY DAY AT 9AM   METFORMIN  (GLUCOPHAGE ) 500 MG TABLET    TAKE 1 TABLET TWICE DAILY AT 9AM AND 5PM WITH MEALS   NYSTATIN  CREAM (MYCOSTATIN )    Apply 1 Application topically 2 (two) times daily.   TRUE METRIX BLOOD GLUCOSE TEST TEST STRIP    USE AS INSTRUCTED   TRUEPLUS LANCETS 33G MISC    USE AS DIRECTED AS NEEDED   UNABLE TO FIND    CPAP  Modified Medications   No medications on file  Discontinued Medications   No medications on file    Physical Exam:  Vitals:   02/29/24 0842  BP: 122/80  Pulse: 71  Temp: (!) 97.5 F (36.4 C)  TempSrc: Temporal  SpO2: 97%  Weight: 236 lb (107 kg)  Height: 5\' 2"  (1.575 m)   Body mass index is 43.16 kg/m. Wt Readings from Last 3 Encounters:  02/29/24 236 lb (107 kg)  09/18/23 235 lb (106.6 kg)  08/25/23 232 lb 9.6 oz (105.5 kg)    Physical Exam Constitutional:      General: She is not in acute distress.    Appearance: She is well-developed. She is not diaphoretic.  HENT:     Head: Normocephalic and atraumatic.     Mouth/Throat:     Pharynx: No oropharyngeal exudate.  Eyes:     Conjunctiva/sclera: Conjunctivae normal.     Pupils: Pupils are equal, round, and reactive to light.  Cardiovascular:     Rate and Rhythm: Normal rate and regular rhythm.     Heart sounds: Normal heart sounds.  Pulmonary:  Effort: Pulmonary effort is normal.     Breath sounds: Normal breath sounds.  Abdominal:     General: Bowel sounds are normal.     Palpations: Abdomen is soft.  Musculoskeletal:     Cervical back: Normal range of motion and neck supple.     Right  lower leg: No edema.     Left lower leg: No edema.  Skin:    General: Skin is warm and dry.  Neurological:     Mental Status: She is alert.     Motor: No weakness.     Gait: Gait normal.  Psychiatric:        Mood and Affect: Mood normal.     Labs reviewed: Basic Metabolic Panel: Recent Labs    06/05/23 1352 08/19/23 0026 08/25/23 1442  NA 139 139 143  K 4.1 3.3* 4.4  CL 108 103 105  CO2 25 26 29   GLUCOSE 228* 123* 107*  BUN 15 21 11   CREATININE 1.03* 1.13* 1.06*  CALCIUM  10.1 10.0 10.7*  TSH 1.85  --   --    Liver Function Tests: Recent Labs    06/05/23 1352 08/19/23 0026  AST 15 23  ALT 11 14  ALKPHOS  --  69  BILITOT 0.4 0.5  PROT 6.2 7.3  ALBUMIN   --  3.7   Recent Labs    08/19/23 0026  LIPASE 25   No results for input(s): "AMMONIA" in the last 8760 hours. CBC: Recent Labs    06/05/23 1352 08/19/23 0026  WBC 4.4 11.3*  NEUTROABS 2,451  --   HGB 11.3* 12.2  HCT 34.5* 36.0  MCV 87.6 85.7  PLT 220 241   Lipid Panel: Recent Labs    06/05/23 1352  CHOL 129  HDL 57  LDLCALC 57  TRIG 73  CHOLHDL 2.3   TSH: Recent Labs    06/05/23 1352  TSH 1.85   A1C: Lab Results  Component Value Date   HGBA1C 6.3 (H) 06/05/2023     Assessment/Plan  Urinary frequency -     POCT urinalysis dipstick -     Urine Culture  Type 2 diabetes mellitus with stage 2 chronic kidney disease, without long-term current use of insulin  (HCC) Assessment & Plan: Encouraged dietary compliance, routine foot care/monitoring and to keep up with diabetic eye exams through ophthalmology.  Continues on metformin  and glipizide . No hypoglycemia reported Follow up a1c  Orders: -     Hemoglobin A1c  Class 3 severe obesity due to excess calories with serious comorbidity and body mass index (BMI) of 45.0 to 49.9 in adult [facility]) Assessment & Plan: -education provided on healthy weight loss through increase in physical activity and proper nutrition.    Primary  osteoarthritis involving multiple joints Assessment & Plan: Stable without worsening of pain.  Tylenol  PRN   Hypothyroidism due to acquired atrophy of thyroid  Assessment & Plan: Continues on synthroid  88 mcg, TSH at goal, follow up yearly   Essential hypertension, benign Assessment & Plan: Blood pressure well controlled, goal bp <140/90 Continue current medications and dietary modifications follow metabolic panel  Orders: -     COMPLETE METABOLIC PANEL WITHOUT GFR -     CBC with Differential/Platelet  Idiopathic gout, unspecified chronicity, unspecified site Assessment & Plan: No recent flares on allopurinol .  Uric acid at goal on last labs.   Hyperlipidemia LDL goal <100 Assessment & Plan: LDL at goal on last labs, continues on lipitor.    History of breast cancer Assessment &  Plan: Will follow up mammogram.   Orders: -     3D Screening Mammogram, Left and Right; Future  OSA (obstructive sleep apnea) Assessment & Plan: Continues with CPAP, due for follow up with pulmonary follow up.    Seasonal allergic rhinitis due to pollen Assessment & Plan: More during this time of year. To take Loratadine or cetrizine (generic for Claritin or zyrtec ) 10 mg by mouth daily for allergies.    Other iron deficiency anemia Assessment & Plan: Hgb stable on last labs, will follow up.    Chronic bilateral low back pain with right-sided sciatica Assessment & Plan: Stable on cymbalta  daily    Screening mammogram for breast cancer -     3D Screening Mammogram, Left and Right; Future     Return in about 6 months (around 08/30/2024) for routine follow up, labs at time of visit.  Kiante Petrovich [facility] [phone]

## 2024-02-29 NOTE — Patient Instructions (Addendum)
 1.)  Visit your local pharmacy to receive your covid booster  2.) Contact your eye doctor to schedule an eye exam and have results faxed to us  at [phone], if you have not done so already.  3.) Your labs will result to your mychart account, if there are any concerns we will call you, otherwise you can view online.   To take Loratadine or cetrizine (generic for Claritin or zyrtec ) 10 mg by mouth daily for allergies.   Make appt with pulmonary regarding sleep apnea/CPAP

## 2024-02-29 NOTE — Assessment & Plan Note (Signed)
 More during this time of year. To take Loratadine or cetrizine (generic for Claritin or zyrtec ) 10 mg by mouth daily for allergies.

## 2024-02-29 NOTE — Assessment & Plan Note (Signed)
-  education provided on healthy weight loss through increase in physical activity and proper nutrition

## 2024-02-29 NOTE — Assessment & Plan Note (Signed)
 Stable on cymbalta  daily

## 2024-02-29 NOTE — Assessment & Plan Note (Signed)
 Will follow up mammogram.

## 2024-03-01 ENCOUNTER — Encounter: Payer: Self-pay | Admitting: Nurse Practitioner

## 2024-03-01 LAB — URINE CULTURE
MICRO NUMBER:: 16355172
Result:: NO GROWTH
SPECIMEN QUALITY:: ADEQUATE

## 2024-03-01 LAB — COMPLETE METABOLIC PANEL WITHOUT GFR
AG Ratio: 1.3 (calc) (ref 1.0–2.5)
ALT: 7 U/L (ref 6–29)
AST: 11 U/L (ref 10–35)
Albumin: 3.6 g/dL (ref 3.6–5.1)
Alkaline phosphatase (APISO): 65 U/L (ref 37–153)
BUN/Creatinine Ratio: 18 (calc) (ref 6–22)
BUN: 19 mg/dL (ref 7–25)
CO2: 27 mmol/L (ref 20–32)
Calcium: 10.4 mg/dL (ref 8.6–10.4)
Chloride: 107 mmol/L (ref 98–110)
Creat: 1.03 mg/dL — ABNORMAL HIGH (ref 0.60–0.95)
Globulin: 2.8 g/dL (ref 1.9–3.7)
Glucose, Bld: 85 mg/dL (ref 65–99)
Potassium: 4 mmol/L (ref 3.5–5.3)
Sodium: 140 mmol/L (ref 135–146)
Total Bilirubin: 0.4 mg/dL (ref 0.2–1.2)
Total Protein: 6.4 g/dL (ref 6.1–8.1)

## 2024-03-01 LAB — CBC WITH DIFFERENTIAL/PLATELET
Absolute Lymphocytes: 1872 {cells}/uL (ref 850–3900)
Absolute Monocytes: 572 {cells}/uL (ref 200–950)
Basophils Absolute: 42 {cells}/uL (ref 0–200)
Basophils Relative: 0.8 %
Eosinophils Absolute: 390 {cells}/uL (ref 15–500)
Eosinophils Relative: 7.5 %
HCT: 36.3 % (ref 35.0–45.0)
Hemoglobin: 11.7 g/dL (ref 11.7–15.5)
MCH: 28.4 pg (ref 27.0–33.0)
MCHC: 32.2 g/dL (ref 32.0–36.0)
MCV: 88.1 fL (ref 80.0–100.0)
MPV: 11.7 fL (ref 7.5–12.5)
Monocytes Relative: 11 %
Neutro Abs: 2324 {cells}/uL (ref 1500–7800)
Neutrophils Relative %: 44.7 %
Platelets: 226 10*3/uL (ref 140–400)
RBC: 4.12 10*6/uL (ref 3.80–5.10)
RDW: 14 % (ref 11.0–15.0)
Total Lymphocyte: 36 %
WBC: 5.2 10*3/uL (ref 3.8–10.8)

## 2024-03-01 LAB — HEMOGLOBIN A1C
Hgb A1c MFr Bld: 6.5 % — ABNORMAL HIGH (ref <5.7)
Mean Plasma Glucose: 140 mg/dL
eAG (mmol/L): 7.7 mmol/L

## 2024-03-03 ENCOUNTER — Other Ambulatory Visit: Payer: Self-pay | Admitting: Adult Health

## 2024-03-03 ENCOUNTER — Other Ambulatory Visit: Payer: Self-pay | Admitting: Nurse Practitioner

## 2024-03-03 DIAGNOSIS — E1122 Type 2 diabetes mellitus with diabetic chronic kidney disease: Secondary | ICD-10-CM

## 2024-03-03 DIAGNOSIS — M7989 Other specified soft tissue disorders: Secondary | ICD-10-CM

## 2024-03-03 DIAGNOSIS — G8929 Other chronic pain: Secondary | ICD-10-CM

## 2024-03-03 DIAGNOSIS — E119 Type 2 diabetes mellitus without complications: Secondary | ICD-10-CM

## 2024-03-03 DIAGNOSIS — I10 Essential (primary) hypertension: Secondary | ICD-10-CM

## 2024-03-03 DIAGNOSIS — E039 Hypothyroidism, unspecified: Secondary | ICD-10-CM

## 2024-03-03 DIAGNOSIS — E785 Hyperlipidemia, unspecified: Secondary | ICD-10-CM

## 2024-03-11 ENCOUNTER — Ambulatory Visit: Payer: Medicare PPO | Admitting: Internal Medicine

## 2024-03-18 ENCOUNTER — Encounter: Payer: Medicare PPO | Admitting: Nurse Practitioner

## 2024-04-27 DIAGNOSIS — M150 Primary generalized (osteo)arthritis: Secondary | ICD-10-CM | POA: Diagnosis not present

## 2024-04-27 DIAGNOSIS — Z79899 Other long term (current) drug therapy: Secondary | ICD-10-CM | POA: Diagnosis not present

## 2024-04-27 DIAGNOSIS — Z136 Encounter for screening for cardiovascular disorders: Secondary | ICD-10-CM | POA: Diagnosis not present

## 2024-04-27 DIAGNOSIS — Z0189 Encounter for other specified special examinations: Secondary | ICD-10-CM | POA: Diagnosis not present

## 2024-04-27 DIAGNOSIS — I10 Essential (primary) hypertension: Secondary | ICD-10-CM | POA: Diagnosis not present

## 2024-04-27 DIAGNOSIS — E559 Vitamin D deficiency, unspecified: Secondary | ICD-10-CM | POA: Diagnosis not present

## 2024-04-27 DIAGNOSIS — E1169 Type 2 diabetes mellitus with other specified complication: Secondary | ICD-10-CM | POA: Diagnosis not present

## 2024-04-27 DIAGNOSIS — Z1159 Encounter for screening for other viral diseases: Secondary | ICD-10-CM | POA: Diagnosis not present

## 2024-05-02 ENCOUNTER — Ambulatory Visit: Admitting: Adult Health

## 2024-05-23 ENCOUNTER — Ambulatory Visit
Admission: RE | Admit: 2024-05-23 | Discharge: 2024-05-23 | Disposition: A | Discharge status: Home / Self Care | Source: Ambulatory Visit | Attending: Nurse Practitioner | Admitting: Nurse Practitioner

## 2024-05-23 DIAGNOSIS — Z1231 Encounter for screening mammogram for malignant neoplasm of breast: Secondary | ICD-10-CM

## 2024-05-23 DIAGNOSIS — Z853 Personal history of malignant neoplasm of breast: Secondary | ICD-10-CM

## 2024-05-27 ENCOUNTER — Other Ambulatory Visit: Payer: Self-pay | Admitting: Nurse Practitioner

## 2024-05-27 DIAGNOSIS — R928 Other abnormal and inconclusive findings on diagnostic imaging of breast: Secondary | ICD-10-CM

## 2024-05-30 ENCOUNTER — Other Ambulatory Visit: Payer: Self-pay | Admitting: Nurse Practitioner

## 2024-05-30 DIAGNOSIS — I10 Essential (primary) hypertension: Secondary | ICD-10-CM | POA: Diagnosis not present

## 2024-05-30 DIAGNOSIS — E1169 Type 2 diabetes mellitus with other specified complication: Secondary | ICD-10-CM | POA: Diagnosis not present

## 2024-05-30 DIAGNOSIS — Z7189 Other specified counseling: Secondary | ICD-10-CM | POA: Diagnosis not present

## 2024-05-30 DIAGNOSIS — I700 Atherosclerosis of aorta: Secondary | ICD-10-CM | POA: Diagnosis not present

## 2024-05-30 DIAGNOSIS — Z23 Encounter for immunization: Secondary | ICD-10-CM | POA: Diagnosis not present

## 2024-05-30 DIAGNOSIS — N1831 Chronic kidney disease, stage 3a: Secondary | ICD-10-CM | POA: Diagnosis not present

## 2024-05-30 DIAGNOSIS — R928 Other abnormal and inconclusive findings on diagnostic imaging of breast: Secondary | ICD-10-CM

## 2024-05-30 DIAGNOSIS — Z0001 Encounter for general adult medical examination with abnormal findings: Secondary | ICD-10-CM | POA: Diagnosis not present

## 2024-05-30 DIAGNOSIS — E785 Hyperlipidemia, unspecified: Secondary | ICD-10-CM | POA: Diagnosis not present

## 2024-05-30 DIAGNOSIS — I272 Pulmonary hypertension, unspecified: Secondary | ICD-10-CM | POA: Diagnosis not present

## 2024-05-30 DIAGNOSIS — J479 Bronchiectasis, uncomplicated: Secondary | ICD-10-CM | POA: Diagnosis not present

## 2024-06-07 ENCOUNTER — Other Ambulatory Visit: Payer: Self-pay | Admitting: Nurse Practitioner

## 2024-06-07 DIAGNOSIS — R928 Other abnormal and inconclusive findings on diagnostic imaging of breast: Secondary | ICD-10-CM

## 2024-06-13 ENCOUNTER — Other Ambulatory Visit: Payer: Self-pay | Admitting: Nurse Practitioner

## 2024-06-13 ENCOUNTER — Ambulatory Visit
Admission: RE | Admit: 2024-06-13 | Discharge: 2024-06-13 | Disposition: A | Discharge status: Home / Self Care | Source: Ambulatory Visit | Attending: Nurse Practitioner

## 2024-06-13 DIAGNOSIS — R928 Other abnormal and inconclusive findings on diagnostic imaging of breast: Secondary | ICD-10-CM

## 2024-06-13 DIAGNOSIS — R921 Mammographic calcification found on diagnostic imaging of breast: Secondary | ICD-10-CM

## 2024-06-14 ENCOUNTER — Ambulatory Visit: Payer: Self-pay | Admitting: Nurse Practitioner

## 2024-06-16 ENCOUNTER — Ambulatory Visit
Admission: RE | Admit: 2024-06-16 | Discharge: 2024-06-16 | Disposition: A | Discharge status: Home / Self Care | Source: Ambulatory Visit | Attending: Nurse Practitioner | Admitting: Nurse Practitioner

## 2024-06-16 DIAGNOSIS — R921 Mammographic calcification found on diagnostic imaging of breast: Secondary | ICD-10-CM

## 2024-06-16 HISTORY — PX: BREAST BIOPSY: SHX20

## 2024-06-17 ENCOUNTER — Encounter: Admitting: Nurse Practitioner

## 2024-06-17 ENCOUNTER — Encounter: Payer: Medicare PPO | Admitting: Nurse Practitioner

## 2024-06-20 LAB — SURGICAL PATHOLOGY

## 2024-09-02 ENCOUNTER — Ambulatory Visit: Admitting: Nurse Practitioner
